# Patient Record
Sex: Female | Born: 1955 | Race: White | Hispanic: No | Marital: Single | Smoking: Never smoker
Health system: Southern US, Community
[De-identification: ages and names within clinical notes are randomized; demographics above are authoritative.]

## PROBLEM LIST (undated history)

## (undated) DIAGNOSIS — T4145XA Adverse effect of unspecified anesthetic, initial encounter: Secondary | ICD-10-CM

## (undated) DIAGNOSIS — Z9889 Other specified postprocedural states: Secondary | ICD-10-CM

## (undated) DIAGNOSIS — C763 Malignant neoplasm of pelvis: Secondary | ICD-10-CM

## (undated) DIAGNOSIS — E079 Disorder of thyroid, unspecified: Secondary | ICD-10-CM

## (undated) DIAGNOSIS — R112 Nausea with vomiting, unspecified: Secondary | ICD-10-CM

## (undated) DIAGNOSIS — Z8489 Family history of other specified conditions: Secondary | ICD-10-CM

## (undated) DIAGNOSIS — E039 Hypothyroidism, unspecified: Secondary | ICD-10-CM

## (undated) DIAGNOSIS — D649 Anemia, unspecified: Secondary | ICD-10-CM

## (undated) DIAGNOSIS — T8859XA Other complications of anesthesia, initial encounter: Secondary | ICD-10-CM

## (undated) DIAGNOSIS — Z801 Family history of malignant neoplasm of trachea, bronchus and lung: Secondary | ICD-10-CM

## (undated) DIAGNOSIS — K219 Gastro-esophageal reflux disease without esophagitis: Secondary | ICD-10-CM

## (undated) DIAGNOSIS — E785 Hyperlipidemia, unspecified: Secondary | ICD-10-CM

## (undated) DIAGNOSIS — Z9221 Personal history of antineoplastic chemotherapy: Secondary | ICD-10-CM

## (undated) DIAGNOSIS — C569 Malignant neoplasm of unspecified ovary: Secondary | ICD-10-CM

## (undated) DIAGNOSIS — E119 Type 2 diabetes mellitus without complications: Secondary | ICD-10-CM

## (undated) DIAGNOSIS — K5790 Diverticulosis of intestine, part unspecified, without perforation or abscess without bleeding: Secondary | ICD-10-CM

## (undated) DIAGNOSIS — T7840XA Allergy, unspecified, initial encounter: Secondary | ICD-10-CM

## (undated) HISTORY — DX: Hyperlipidemia, unspecified: E78.5

## (undated) HISTORY — DX: Family history of malignant neoplasm of trachea, bronchus and lung: Z80.1

## (undated) HISTORY — DX: Diverticulosis of intestine, part unspecified, without perforation or abscess without bleeding: K57.90

## (undated) HISTORY — PX: ABDOMINAL HYSTERECTOMY: SHX81

## (undated) HISTORY — DX: Disorder of thyroid, unspecified: E07.9

## (undated) HISTORY — DX: Allergy, unspecified, initial encounter: T78.40XA

## (undated) HISTORY — DX: Type 2 diabetes mellitus without complications: E11.9

## (undated) HISTORY — PX: BREAST CYST ASPIRATION: SHX578

## (undated) HISTORY — DX: Malignant neoplasm of pelvis: C76.3

---
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
## 2005-02-12 HISTORY — PX: BACK SURGERY: SHX140

## 2005-04-23 ENCOUNTER — Inpatient Hospital Stay (HOSPITAL_COMMUNITY): Admission: RE | Admit: 2005-04-23 | Discharge: 2005-04-28 | Payer: Self-pay | Admitting: Neurosurgery

## 2005-05-09 ENCOUNTER — Encounter: Admission: RE | Admit: 2005-05-09 | Discharge: 2005-05-09 | Payer: Self-pay | Admitting: Thoracic Surgery

## 2005-05-23 ENCOUNTER — Encounter: Admission: RE | Admit: 2005-05-23 | Discharge: 2005-05-23 | Payer: Self-pay | Admitting: Thoracic Surgery

## 2005-06-13 ENCOUNTER — Encounter: Admission: RE | Admit: 2005-06-13 | Discharge: 2005-06-13 | Payer: Self-pay | Admitting: Thoracic Surgery

## 2005-09-25 ENCOUNTER — Encounter: Admission: RE | Admit: 2005-09-25 | Discharge: 2005-09-25 | Payer: Self-pay | Admitting: Thoracic Surgery

## 2006-11-27 DIAGNOSIS — N809 Endometriosis, unspecified: Secondary | ICD-10-CM | POA: Insufficient documentation

## 2006-11-27 DIAGNOSIS — J309 Allergic rhinitis, unspecified: Secondary | ICD-10-CM | POA: Insufficient documentation

## 2006-11-27 DIAGNOSIS — E039 Hypothyroidism, unspecified: Secondary | ICD-10-CM | POA: Insufficient documentation

## 2007-01-13 ENCOUNTER — Ambulatory Visit: Payer: Self-pay | Admitting: Family Medicine

## 2007-01-29 ENCOUNTER — Ambulatory Visit: Payer: Self-pay | Admitting: Family Medicine

## 2007-02-09 DIAGNOSIS — E7800 Pure hypercholesterolemia, unspecified: Secondary | ICD-10-CM | POA: Insufficient documentation

## 2008-02-13 HISTORY — PX: CATARACT EXTRACTION: SUR2

## 2008-06-29 ENCOUNTER — Ambulatory Visit: Payer: Self-pay | Admitting: Family Medicine

## 2009-01-06 ENCOUNTER — Emergency Department: Payer: Self-pay | Admitting: Emergency Medicine

## 2009-07-06 ENCOUNTER — Ambulatory Visit: Payer: Self-pay | Admitting: Ophthalmology

## 2009-08-23 ENCOUNTER — Ambulatory Visit: Payer: Self-pay

## 2009-09-26 ENCOUNTER — Ambulatory Visit: Payer: Self-pay | Admitting: Gastroenterology

## 2009-09-26 LAB — HM COLONOSCOPY

## 2010-03-05 ENCOUNTER — Encounter: Payer: Self-pay | Admitting: Thoracic Surgery

## 2011-04-04 LAB — HM PAP SMEAR: HM PAP: NEGATIVE

## 2011-04-04 LAB — HM HEPATITIS C SCREENING LAB: HM Hepatitis Screen: NEGATIVE

## 2011-04-17 ENCOUNTER — Ambulatory Visit: Payer: Self-pay | Admitting: Family Medicine

## 2011-06-26 ENCOUNTER — Ambulatory Visit: Payer: Self-pay

## 2011-07-05 ENCOUNTER — Ambulatory Visit: Payer: Self-pay

## 2011-09-06 ENCOUNTER — Ambulatory Visit: Payer: Self-pay | Admitting: Family Medicine

## 2012-09-22 ENCOUNTER — Ambulatory Visit: Payer: Self-pay | Admitting: Family Medicine

## 2012-09-23 ENCOUNTER — Ambulatory Visit: Payer: Self-pay | Admitting: Family Medicine

## 2012-12-07 ENCOUNTER — Emergency Department: Payer: Self-pay | Admitting: Emergency Medicine

## 2013-03-25 LAB — LIPID PANEL
CHOLESTEROL: 160 mg/dL (ref 0–200)
HDL: 63 mg/dL (ref 35–70)
LDL Cholesterol: 74 mg/dL
Triglycerides: 115 mg/dL (ref 40–160)

## 2013-03-25 LAB — BASIC METABOLIC PANEL
BUN: 8 mg/dL (ref 4–21)
CREATININE: 0.7 mg/dL (ref <=1.1)
Glucose: 89 mg/dL
Potassium: 3.9 mmol/L (ref 3.4–5.3)
Sodium: 144 mmol/L (ref 137–147)

## 2013-03-25 LAB — CBC AND DIFFERENTIAL
HCT: 37 % (ref 36–46)
Hemoglobin: 12.3 g/dL (ref 12.0–16.0)
Platelets: 177 10*3/uL (ref 150–399)
WBC: 5.4 10^3/mL

## 2013-03-25 LAB — HEPATIC FUNCTION PANEL
ALT: 23 U/L (ref 7–35)
AST: 36 U/L — AB (ref 13–35)

## 2013-10-02 LAB — TSH: TSH: 1.13 u[IU]/mL (ref <=5.90)

## 2013-11-12 ENCOUNTER — Ambulatory Visit: Payer: Self-pay | Admitting: Family Medicine

## 2013-11-12 LAB — HM MAMMOGRAPHY

## 2014-06-06 NOTE — Op Note (Signed)
PATIENT NAME:  Meredith Jones, Meredith Jones MR#:  [id number] DATE OF BIRTH:  03-May-1955  DATE OF PROCEDURE:  07/05/2011  PREOPERATIVE DIAGNOSIS: Possible endometrial hyperplasia.   POSTOPERATIVE DIAGNOSIS: Postmenopausal.   PROCEDURE PERFORMED: Dilation and curettage.  SURGEON: Wonda Cheng. Laurey Morale, M.D.   OPERATIVE FINDINGS: Exceedingly tight vagina making visualization of the cervix almost impossible. Ultimately a combination of several of the speculums were broken down to allow visualization of the cervix which was grasped with a Jacob's tenaculum. The uterine cavity sounded to 4 cm. The cervix was dilated with some difficulty. Uterine curettage was performed with return of minimal tissue if any at all. The patient tolerated the procedure well and left the Operating Room in good condition. Sponge and needle counts were said to be correct at the end of the procedure.  ____________________________ Wonda Cheng. Laurey Morale, MD pjr:slb D: 07/05/2011 11:43:45 ET T: 07/05/2011 14:59:26 ET JOB#: [id number]  cc: Wonda Cheng. Laurey Morale, MD, <Dictator> Rosina Lowenstein MD ELECTRONICALLY SIGNED 07/06/2011 17:03

## 2014-06-09 DIAGNOSIS — N852 Hypertrophy of uterus: Secondary | ICD-10-CM | POA: Insufficient documentation

## 2014-06-09 DIAGNOSIS — N8500 Endometrial hyperplasia, unspecified: Secondary | ICD-10-CM | POA: Insufficient documentation

## 2014-06-09 DIAGNOSIS — Z6828 Body mass index (BMI) 28.0-28.9, adult: Secondary | ICD-10-CM | POA: Insufficient documentation

## 2014-06-09 DIAGNOSIS — N83209 Unspecified ovarian cyst, unspecified side: Secondary | ICD-10-CM | POA: Insufficient documentation

## 2014-06-13 HISTORY — PX: EYE SURGERY: SHX253

## 2014-07-27 ENCOUNTER — Encounter: Payer: Self-pay | Admitting: Family Medicine

## 2014-07-27 ENCOUNTER — Ambulatory Visit (INDEPENDENT_AMBULATORY_CARE_PROVIDER_SITE_OTHER): Payer: Commercial Managed Care - PPO | Admitting: Family Medicine

## 2014-07-27 VITALS — BP 110/60 | HR 76 | Temp 98.6°F | Resp 16 | Ht 64.0 in | Wt 150.0 lb

## 2014-07-27 DIAGNOSIS — E119 Type 2 diabetes mellitus without complications: Secondary | ICD-10-CM | POA: Diagnosis not present

## 2014-07-27 DIAGNOSIS — R81 Glycosuria: Secondary | ICD-10-CM | POA: Diagnosis not present

## 2014-07-27 DIAGNOSIS — Z0000 Encounter for general adult medical examination without abnormal findings: Secondary | ICD-10-CM | POA: Diagnosis not present

## 2014-07-27 DIAGNOSIS — Z1211 Encounter for screening for malignant neoplasm of colon: Secondary | ICD-10-CM | POA: Diagnosis not present

## 2014-07-27 DIAGNOSIS — E7800 Pure hypercholesterolemia, unspecified: Secondary | ICD-10-CM

## 2014-07-27 DIAGNOSIS — E039 Hypothyroidism, unspecified: Secondary | ICD-10-CM

## 2014-07-27 LAB — POCT URINALYSIS DIPSTICK
BILIRUBIN UA: NEGATIVE
Blood, UA: NEGATIVE
GLUCOSE UA: 2000
KETONES UA: NEGATIVE
LEUKOCYTES UA: NEGATIVE
Nitrite, UA: NEGATIVE
Protein, UA: NEGATIVE
SPEC GRAV UA: 1.01
Urobilinogen, UA: 0.2
pH, UA: 6

## 2014-07-27 LAB — IFOBT (OCCULT BLOOD): IFOBT: NEGATIVE

## 2014-07-27 LAB — GLUCOSE, POCT (MANUAL RESULT ENTRY)

## 2014-07-27 MED ORDER — GLIPIZIDE 5 MG PO TABS: 5.0000 mg | ORAL_TABLET | Freq: Two times a day (BID) | ORAL | Status: DC

## 2014-07-27 MED ORDER — METFORMIN HCL 500 MG PO TABS: 500.0000 mg | ORAL_TABLET | Freq: Two times a day (BID) | ORAL | Status: DC

## 2014-07-27 NOTE — Progress Notes (Signed)
Patient ID: Meredith Jones, female   DOB: 11-Jul-1955, 59 y.o.   MRN: [id number] [name], Female    DOB: 05-Apr-1955, 59 y.o.   MRN: [id number] Visit Date: 07/27/2014  Today's Provider: Margarita Rana, MD   Chief Complaint  Patient presents with  . Annual Exam  . Urinary Frequency   Subjective:    Annual physical exam Meredith Jones is a 59 y.o. female who presents today for health maintenance and complete physical. She feels fairly well. She reports exercising (stays very active with work and house chores). She reports she is sleeping well.  Last CPE: 10/02/13 04/04/11 Pap: neg HPV-neg 11/12/13 Mammogram: BI-RADS 1 04/04/11 EKG 01/16/07: TDAP    Patient also with urinary frequency, weight loss, dry mouth, worsening over past month. Does also have fatigue. Has strong family history of diabetes. Normal labs last year. No vision changes. No numbness or other systemic symptoms.    Review of Systems  Constitutional: Positive for fatigue.  HENT: Negative.   Eyes: Negative.   Respiratory: Negative.   Cardiovascular: Negative.   Gastrointestinal: Negative.   Endocrine: Negative.   Genitourinary: Positive for urgency, frequency and enuresis.  Musculoskeletal: Negative.   Allergic/Immunologic: Negative.   Neurological: Negative.   Hematological: Negative.   Psychiatric/Behavioral: Positive for decreased concentration.    Social History She  reports that she has never smoked. She has never used smokeless tobacco. She reports that she does not drink alcohol or use illicit drugs.  Patient Active Problem List   Diagnosis Date Noted  . Body mass index 28.0-28.9, adult 06/09/2014  . 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 Cyst of ovary 06/09/2014  . Bulky or enlarged uterus 06/09/2014  . Hypercholesteremia 02/09/2007  . Allergic rhinitis 11/27/2006  . Endometriosis 11/27/2006  . Adult hypothyroidism 11/27/2006    Past Surgical History  Procedure Laterality Date   . Cataract extraction Left 2010    Right Eye 2011  . Back surgery  2007    Lumbar Surgery  . Tonsillectomy  1963  . Eye surgery Bilateral 06/2014     tear duck catherization ( Dr. Vallarie Mare)    Family History Her family history includes Cancer in her father and paternal grandmother; Congestive Heart Failure in her mother; Diabetes in her mother; Hypothyroidism in her father; Stroke in her mother.    Previous Medications   ASPIRIN 81 MG TABLET    ASPIRIN, 81MG  (Oral Tablet Delayed Release)  1 a week for 0 days  Quantity: 0.00;  Refills: 0   Ordered :02-Oct-2010  Doy Hutching ;  Started 12-Mar-2007 Active   CALCIPOTRIENE (DOVONOX) 0.005 % OINTMENT    CALCIPOTRIENE, 0.005% (External Ointment) - Historical Medication  apply to skin daily (0.005 %) Active   CLOBETASOL OINTMENT (TEMOVATE) 0.05 %    CLOBETASOL PROPIONATE, 0.05% (External Ointment) - Historical Medication  apply to skin daily (0.05 %) Active   CYCLOSPORINE (RESTASIS) 0.05 % OPHTHALMIC EMULSION       FEXOFENADINE (ALLEGRA) 60 MG TABLET    Take by mouth.   LEVOTHYROXINE (SYNTHROID, LEVOTHROID) 112 MCG TABLET    Take by mouth.   LORATADINE (CLARITIN) 10 MG TABLET    ALAVERT, 10MG  (Oral Tablet)  1 po q d as needed for 0 days  Quantity: 1.00;  Refills: 0   Ordered :02-Oct-2010  Reginia Forts MD;  Buddy Duty [phone] Active Comments: Medication taken as needed.   MULTIPLE VITAMIN PO    MULTIVITAMINS (Oral Tablet)  1 Every Day for 0 days  Quantity:  0.00;  Refills: 0   Ordered :02-Oct-2010  Doy Hutching ;  Started 12-Mar-2007 Active   SIMVASTATIN (ZOCOR) 20 MG TABLET    Take by mouth.   TRIAMCINOLONE CREAM (KENALOG) 0.1 %    Apply 1 application topically 2 (two) times daily.    Patient Care Team: Margarita Rana, MD as PCP - General (Family Medicine)   Allergies  Allergen Reactions  . Clarithromycin Nausea Only  . Doxycycline     nausea, dizziness.  Marland Kitchen Penicillins Hives        Objective:   Vitals: BP 110/60 mmHg  Pulse  76  Temp(Src) 98.6 F (37 C) (Oral)  Resp 16  Ht 5\' 4"  (1.626 m)  Wt 150 lb (68.04 kg)  BMI 25.73 kg/m2   Physical Exam  Constitutional: She is oriented to person, place, and time. She appears well-developed and well-nourished.  HENT:  Head: Normocephalic and atraumatic.  Right Ear: Tympanic membrane, external ear and ear canal normal.  Left Ear: Tympanic membrane, external ear and ear canal normal.  Nose: Nose normal.  Mouth/Throat: Uvula is midline, oropharynx is clear and moist and mucous membranes are normal.  Eyes: Conjunctivae, EOM and lids are normal. Pupils are equal, round, and reactive to light.  Neck: Trachea normal and normal range of motion. Neck supple. Carotid bruit is not present. No thyroid mass and no thyromegaly present.  Cardiovascular: Normal rate, regular rhythm and normal heart sounds.   Pulmonary/Chest: Effort normal and breath sounds normal.  Abdominal: Soft. Normal appearance and bowel sounds are normal. There is no hepatosplenomegaly. There is no tenderness.  Genitourinary: No breast swelling, tenderness or discharge.  Musculoskeletal: Normal range of motion.  Lymphadenopathy:    She has no cervical adenopathy.    She has no axillary adenopathy.  Neurological: She is alert and oriented to person, place, and time. She has normal strength. No cranial nerve deficit.  Skin: Skin is warm, dry and intact.  Psychiatric: She has a normal mood and affect. Her speech is normal and behavior is normal. Judgment and thought content normal. Cognition and memory are normal.     Depression Screen PHQ 2/9 Scores 07/27/2014  PHQ - 2 Score 0      Assessment & Plan:      Routine Health Maintenance and Physical Exam  Exercise Activities and Dietary recommendations Goals    . Exercise 150 minutes per week (moderate activity)    . Peak Blood Glucose < 180       Immunization History  Administered Date(s) Administered  . Tdap 01/16/2007    Health Maintenance   Topic Date Due  . HEMOGLOBIN A1C  July [birth year], 1955  . PNEUMOCOCCAL POLYSACCHARIDE VACCINE (1) 03/01/1957  . FOOT EXAM  03/01/1965  . OPHTHALMOLOGY EXAM  03/01/1965  . URINE MICROALBUMIN  03/01/1965  . HIV Screening  03/01/1970  . PAP SMEAR  04/03/2014  . INFLUENZA VACCINE  09/13/2014  . MAMMOGRAM  11/13/2015  . TETANUS/TDAP  01/15/2017  . COLONOSCOPY  09/27/2019      Discussed health benefits of physical activity, and encouraged her to engage in regular exercise appropriate for her age and condition.    --------------------------------------------------------------------  - POCT urinalysis dipstick  2. Glucose found in urine on examination Blood sugar too high to read today.  See below.  - POCT glucose (manual entry)  3. Colon cancer screening No blood.  - IFOBT POC (occult bld, rslt in office)  4. Diabetes mellitus, new onset  Discussed with Dr Gabriel Carina. Will follow up  with her. Will check labs and give trial of oral agents as currently so stable, no ketones. Will start Metformin and Glipizide. Given monitor and taught how to use it today.   - Ambulatory referral to Endocrinology - Hemoglobin A1c  5. Hypothyroidism, unspecified hypothyroidism type Will check labs.  - TSH  6. Hypercholesteremia Will check labs.  - CBC with Differential/Platelet - Comprehensive metabolic panel - Lipid panel    Patient seen and examined by Dr. Jerrell Belfast, and note scribed by Philbert Riser. Dimas, CMA. I have reviewed the document for accuracy and completeness and I agree with above. Jerrell Belfast, MD    Margarita Rana, MD

## 2014-07-29 ENCOUNTER — Telehealth: Payer: Self-pay

## 2014-07-29 LAB — LIPID PANEL
CHOL/HDL RATIO: 2.5 ratio (ref 0.0–4.4)
CHOLESTEROL TOTAL: 150 mg/dL (ref 100–199)
HDL: 59 mg/dL (ref >39)
LDL Calculated: 68 mg/dL (ref 0–99)
Triglycerides: 117 mg/dL (ref 0–149)
VLDL Cholesterol Cal: 23 mg/dL (ref 5–40)

## 2014-07-29 LAB — CBC WITH DIFFERENTIAL/PLATELET
Basophils Absolute: 0.1 10*3/uL (ref 0.0–0.2)
Basos: 1 %
EOS (ABSOLUTE): 0.6 10*3/uL — ABNORMAL HIGH (ref 0.0–0.4)
Eos: 12 %
Hematocrit: 38.8 % (ref 34.0–46.6)
Hemoglobin: 13.4 g/dL (ref 11.1–15.9)
IMMATURE GRANULOCYTES: 0 %
Immature Grans (Abs): 0 10*3/uL (ref 0.0–0.1)
Lymphocytes Absolute: 1.6 10*3/uL (ref 0.7–3.1)
Lymphs: 31 %
MCH: 32.6 pg (ref 26.6–33.0)
MCHC: 34.5 g/dL (ref 31.5–35.7)
MCV: 94 fL (ref 79–97)
MONOS ABS: 0.4 10*3/uL (ref 0.1–0.9)
Monocytes: 9 %
NEUTROS PCT: 47 %
Neutrophils Absolute: 2.4 10*3/uL (ref 1.4–7.0)
PLATELETS: 217 10*3/uL (ref 150–379)
RBC: 4.11 x10E6/uL (ref 3.77–5.28)
RDW: 13.8 % (ref 12.3–15.4)
WBC: 5 10*3/uL (ref 3.4–10.8)

## 2014-07-29 LAB — COMPREHENSIVE METABOLIC PANEL
A/G RATIO: 1.6 (ref 1.1–2.5)
ALT: 27 IU/L (ref 0–32)
AST: 29 IU/L (ref 0–40)
Albumin: 4.1 g/dL (ref 3.5–5.5)
Alkaline Phosphatase: 76 IU/L (ref 39–117)
BUN/Creatinine Ratio: 11 (ref 9–23)
BUN: 8 mg/dL (ref 6–24)
Bilirubin Total: 0.5 mg/dL (ref 0.0–1.2)
CALCIUM: 9.8 mg/dL (ref 8.7–10.2)
CO2: 27 mmol/L (ref 18–29)
CREATININE: 0.75 mg/dL (ref 0.57–1.00)
Chloride: 94 mmol/L — ABNORMAL LOW (ref 97–108)
GFR calc Af Amer: 101 mL/min/{1.73_m2} (ref >59)
GFR calc non Af Amer: 88 mL/min/{1.73_m2} (ref >59)
GLUCOSE: 364 mg/dL — AB (ref 65–99)
Globulin, Total: 2.5 g/dL (ref 1.5–4.5)
POTASSIUM: 4.1 mmol/L (ref 3.5–5.2)
Sodium: 137 mmol/L (ref 134–144)
TOTAL PROTEIN: 6.6 g/dL (ref 6.0–8.5)

## 2014-07-29 LAB — HEMOGLOBIN A1C
Est. average glucose Bld gHb Est-mCnc: 324 mg/dL
Hgb A1c MFr Bld: 12.9 % — ABNORMAL HIGH (ref 4.8–5.6)

## 2014-07-29 LAB — TSH: TSH: 0.42 u[IU]/mL — ABNORMAL LOW (ref 0.450–4.500)

## 2014-07-29 NOTE — Telephone Encounter (Signed)
-----   Message from Margarita Rana, MD sent at 07/29/2014 11:22 AM EDT ----- Labs consistent with diabetes. Proceed with plan as discussed. Clarify when seeing endocrinology.  Thanks.

## 2014-07-29 NOTE — Telephone Encounter (Signed)
Pt advised as directed below.   Thanks,   -Laura  

## 2014-07-29 NOTE — Telephone Encounter (Signed)
LMTCB 07/29/2014  Thanks,   -Mickel Baas

## 2014-07-30 DIAGNOSIS — E1165 Type 2 diabetes mellitus with hyperglycemia: Secondary | ICD-10-CM | POA: Insufficient documentation

## 2014-07-30 DIAGNOSIS — IMO0002 Reserved for concepts with insufficient information to code with codable children: Secondary | ICD-10-CM | POA: Insufficient documentation

## 2014-08-10 ENCOUNTER — Encounter: Payer: Self-pay | Admitting: Dietician

## 2014-08-10 ENCOUNTER — Encounter: Payer: Commercial Managed Care - PPO | Attending: Family Medicine | Admitting: Dietician

## 2014-08-10 VITALS — BP 105/66 | Ht 63.5 in | Wt 143.1 lb

## 2014-08-10 DIAGNOSIS — E118 Type 2 diabetes mellitus with unspecified complications: Secondary | ICD-10-CM | POA: Diagnosis present

## 2014-08-10 DIAGNOSIS — E119 Type 2 diabetes mellitus without complications: Secondary | ICD-10-CM

## 2014-08-10 NOTE — Patient Instructions (Signed)
  Check blood sugars 2 x day before breakfast and 2 hrs after supper every day Continue walking  30 min daily   Avoid sugar sweetened drinks (soda, tea, coffee, sports drinks, juices) Eat 3 balanced meals/day and 2-3 snacks/day-be sure to eat a small bedtime snack  Space meals 4-5 hours apart Bring blood sugar records to the next appointment/class Get a Regulatory affairs officer fast acting glucose or candy at all times Return for appointment/classes on:  09-20-14

## 2014-08-10 NOTE — Progress Notes (Signed)
Diabetes Self-Management Education  Visit Type: First/Initial  Appt. Start Time: 1345 Appt. End Time: 7517  08/10/2014  Ms. Meredith Jones, identified by name and date of birth, is a 59 y.o. female with a diagnosis of Diabetes: Type 2.  Other people present during visit:      ASSESSMENT  Blood pressure 105/66, height 5' 3.5" (1.613 m), weight 143 lb 1.6 oz (64.91 kg). Body mass index is 24.95 kg/(m^2).  Pt reports recent FBG's 118-150's-had recent weak episode x1 which resolved after eating  Initial Visit Information:  Are you currently following a meal plan?:  (eating more vegetables and fruits, lean meats)   Are you taking your medications as prescribed?: Yes Are you checking your feet?: Yes How many days per week are you checking your feet?: 7 How often do you need to have someone help you when you read instructions, pamphlets, or other written materials from your doctor or pharmacy?: 1 - Never What is the last grade level you completed in school?: college graduate  Psychosocial:     Patient Belief/Attitude about Diabetes: Motivated to manage diabetes Self-care barriers: None Patient Concerns: Glycemic Control, Healthy Lifestyle Special Needs: None Preferred Learning Style: Auditory Learning Readiness: Ready  Complications:   How often do you check your blood sugar?: 1-2 times/day (checks FBG's daily) Fasting Blood glucose range (mg/dL): 130-179 (130's-150's recently) Have you had a dilated eye exam in the past 12 months?: Yes Have you had a dental exam in the past 12 months?: Yes  Diet Intake:  Breakfast:  (breakfast time varies 5-7a) Snack (morning):  (usually eats mid AM snack) Snack (afternoon):  (eats occasional afternoon snack) Snack (evening):  (does not eat a bedtime snack) Beverage(s):  (drinks occasional  fruit juice daily)  Exercise:  Exercise: ADL's, Light (walking / raking leaves) (alks, cleans, works 30-60 min daily)  Individualized Plan for  Diabetes Self-Management Training:   Learning Objective:  Patient will have a greater understanding of diabetes self-management.  Patient education plan per assessed needs and concerns is to attend individual sessions for     Education Topics Reviewed with Patient Today:  Definition of diabetes, type 1 and 2, and the diagnosis of diabetes Role of diet in the treatment of diabetes and the relationship between the three main macronutrients and blood glucose level, Food label reading, portion sizes and measuring food., Carbohydrate counting Role of exercise on diabetes management, blood pressure control and cardiac health. Reviewed patients medication for diabetes, action, purpose, timing of dose and side effects. Purpose and frequency of SMBG., Taught/evaluated SMBG meter., Taught/discussed recording of test results and interpretation of SMBG., Identified appropriate SMBG and/or A1C goals., Daily foot exams, Yearly dilated eye exam Taught treatment of hypoglycemia - the 15 rule. Relationship between chronic complications and blood glucose control, Dental care Role of stress on diabetes   Lifestyle issues that need to be addressed for better diabetes care  PATIENTS GOALS/Plan (Developed by the patient): Improve BG's  Lead healthier lifestyle Prevent complications           Patient Instructions   Check blood sugars 2 x day before breakfast and 2 hrs after supper every day Continue walking  30 min daily   Avoid sugar sweetened drinks (soda, tea, coffee, sports drinks, juices) Eat 3 balanced meals/day and 2-3 snacks/day-be sure to eat a small bedtime snack  Space meals 4-5 hours apart Bring blood sugar records to the next appointment/class Get a Regulatory affairs officer fast acting glucose or candy at all times  Return for appointment/classes on:  09-20-14   Expected Outcomes:  Demonstrated interest in learning. Expect positive outcomes  Education material provided: general meal  planning guidelines, low BG management  If problems or questions, patient to contact team via:  [phone]  Future DSME appointment:  09-20-14

## 2014-09-20 ENCOUNTER — Encounter: Payer: Commercial Managed Care - PPO | Attending: Family Medicine | Admitting: Dietician

## 2014-09-20 VITALS — Ht 63.5 in | Wt 137.7 lb

## 2014-09-20 DIAGNOSIS — E118 Type 2 diabetes mellitus with unspecified complications: Secondary | ICD-10-CM | POA: Insufficient documentation

## 2014-09-20 DIAGNOSIS — E119 Type 2 diabetes mellitus without complications: Secondary | ICD-10-CM

## 2014-09-20 NOTE — Addendum Note (Signed)
Addended by: Georgina Peer on: 09/20/2014 06:28 PM   Modules accepted: Medications

## 2014-09-20 NOTE — Progress Notes (Signed)

## 2014-09-27 ENCOUNTER — Encounter: Payer: Commercial Managed Care - PPO | Admitting: Dietician

## 2014-09-27 ENCOUNTER — Encounter: Payer: Self-pay | Admitting: Dietician

## 2014-09-27 VITALS — Wt 137.0 lb

## 2014-09-27 DIAGNOSIS — E119 Type 2 diabetes mellitus without complications: Secondary | ICD-10-CM

## 2014-09-27 DIAGNOSIS — E118 Type 2 diabetes mellitus with unspecified complications: Secondary | ICD-10-CM | POA: Diagnosis not present

## 2014-09-27 NOTE — Progress Notes (Signed)

## 2014-10-04 ENCOUNTER — Encounter: Payer: Commercial Managed Care - PPO | Admitting: Dietician

## 2014-10-04 VITALS — BP 94/62 | Ht 63.5 in | Wt 132.8 lb

## 2014-10-04 DIAGNOSIS — E118 Type 2 diabetes mellitus with unspecified complications: Secondary | ICD-10-CM | POA: Diagnosis not present

## 2014-10-04 DIAGNOSIS — E119 Type 2 diabetes mellitus without complications: Secondary | ICD-10-CM

## 2014-10-04 NOTE — Progress Notes (Signed)

## 2014-10-05 ENCOUNTER — Encounter: Payer: Self-pay | Admitting: Dietician

## 2014-11-24 ENCOUNTER — Other Ambulatory Visit: Payer: Self-pay | Admitting: Family Medicine

## 2014-11-24 DIAGNOSIS — E1165 Type 2 diabetes mellitus with hyperglycemia: Secondary | ICD-10-CM

## 2014-11-24 DIAGNOSIS — E039 Hypothyroidism, unspecified: Secondary | ICD-10-CM

## 2014-11-24 DIAGNOSIS — IMO0001 Reserved for inherently not codable concepts without codable children: Secondary | ICD-10-CM

## 2014-12-06 ENCOUNTER — Telehealth: Payer: Self-pay | Admitting: Family Medicine

## 2014-12-06 DIAGNOSIS — E039 Hypothyroidism, unspecified: Secondary | ICD-10-CM

## 2014-12-06 MED ORDER — LEVOTHYROXINE SODIUM 100 MCG PO TABS: 100.0000 ug | ORAL_TABLET | Freq: Every day | ORAL | Status: DC

## 2014-12-06 NOTE — Telephone Encounter (Signed)
Thyroid still overcorrected.Recommend decrease Levothyoxine to 100 and recheck labs in 6 weeks. Thanks.

## 2014-12-07 MED ORDER — LEVOTHYROXINE SODIUM 100 MCG PO TABS: 100.0000 ug | ORAL_TABLET | Freq: Every day | ORAL | Status: DC

## 2014-12-07 NOTE — Telephone Encounter (Signed)
Pt informed and voiced understanding of results.Med sent to pharmacy.

## 2014-12-07 NOTE — Telephone Encounter (Signed)
[id number] 

## 2014-12-07 NOTE — Telephone Encounter (Signed)
Left message to call back  

## 2014-12-10 ENCOUNTER — Other Ambulatory Visit: Payer: Self-pay | Admitting: Family Medicine

## 2014-12-10 DIAGNOSIS — E7800 Pure hypercholesterolemia, unspecified: Secondary | ICD-10-CM

## 2014-12-24 ENCOUNTER — Other Ambulatory Visit: Payer: Self-pay

## 2014-12-24 DIAGNOSIS — E1165 Type 2 diabetes mellitus with hyperglycemia: Principal | ICD-10-CM

## 2014-12-24 DIAGNOSIS — IMO0001 Reserved for inherently not codable concepts without codable children: Secondary | ICD-10-CM

## 2014-12-24 MED ORDER — GLUCOSE BLOOD VI STRP: ORAL_STRIP | Status: DC

## 2014-12-24 MED ORDER — GLIPIZIDE 5 MG PO TABS: ORAL_TABLET | ORAL | Status: DC

## 2015-02-03 ENCOUNTER — Encounter: Payer: Self-pay | Admitting: Family Medicine

## 2015-06-07 DIAGNOSIS — E119 Type 2 diabetes mellitus without complications: Secondary | ICD-10-CM | POA: Insufficient documentation

## 2015-06-10 ENCOUNTER — Other Ambulatory Visit: Payer: Self-pay | Admitting: Family Medicine

## 2015-06-10 DIAGNOSIS — E7800 Pure hypercholesterolemia, unspecified: Secondary | ICD-10-CM

## 2015-07-05 ENCOUNTER — Ambulatory Visit: Payer: Commercial Managed Care - PPO | Admitting: Family Medicine

## 2015-07-05 ENCOUNTER — Ambulatory Visit (INDEPENDENT_AMBULATORY_CARE_PROVIDER_SITE_OTHER): Payer: Commercial Managed Care - PPO | Admitting: Family Medicine

## 2015-07-05 ENCOUNTER — Encounter: Payer: Self-pay | Admitting: Family Medicine

## 2015-07-05 VITALS — BP 102/64 | HR 76 | Temp 98.6°F | Resp 16 | Ht 63.5 in | Wt 114.0 lb

## 2015-07-05 DIAGNOSIS — R634 Abnormal weight loss: Secondary | ICD-10-CM | POA: Diagnosis not present

## 2015-07-05 NOTE — Progress Notes (Signed)
Patient ID: Meredith Jones, female   DOB: Jul 03, 1955, 60 y.o.   MRN: [id number]         [name] Female    DOB: Feb 12, 1956   60 y.o.   MRN: [id number] Visit Date: 07/05/2015  Today's Provider: Margarita Rana, MD   Chief Complaint  Patient presents with  . Weight Loss   Subjective:    HPI   Pt comes in today complaining of unexplained weight loss.  Pt weighed 150lbs in 07/2014; today she weighs 114lb.  She reports feeling fine; and has a good appetite.  She is careful about what she eats secondary to being diagnosed with Diabetes in 07/2014, but not to explain the amount of weight she has lost.   Sugars are doing much better.  Has had a lot of stress recently. Taking care of sick family members.  ROS unremarkable as noted.        Allergies  Allergen Reactions  . Clarithromycin Nausea Only  . Doxycycline     nausea, dizziness.  Marland Kitchen Penicillins Hives   Previous Medications   ASPIRIN 81 MG TABLET    ASPIRIN, 81MG  (Oral Tablet Delayed Release)  1 a week for 0 days  Quantity: 0.00;  Refills: 0   Ordered :02-Oct-2010  Doy Hutching ;  Started 12-Mar-2007 Active   BISMUTH SUBSALICYLATE (PEPTO BISMOL) 262 MG CHEWABLE TABLET    Chew 262 mg by mouth as needed.   CALCIPOTRIENE (DOVONOX) 0.005 % OINTMENT    CALCIPOTRIENE, 0.005% (External Ointment) - Historical Medication  apply to skin daily (0.005 %) Active   CLOBETASOL OINTMENT (TEMOVATE) 0.05 %    CLOBETASOL PROPIONATE, 0.05% (External Ointment) - Historical Medication  apply to skin daily (0.05 %) Active   CYCLOSPORINE (RESTASIS) 0.05 % OPHTHALMIC EMULSION    Place 1 drop into both eyes 2 (two) times daily.    FEXOFENADINE (ALLEGRA) 60 MG TABLET    Take 60 mg by mouth daily. As needed for allergies   GLIPIZIDE (GLUCOTROL) 5 MG TABLET    TAKE ONE TABLET BEFORE MEALS TWICE A DAY   GLUCOSE BLOOD (ONE TOUCH ULTRA TEST) TEST STRIP    Use as instructed   LEVOTHYROXINE (SYNTHROID, LEVOTHROID) 100 MCG TABLET    Take 1 tablet  (100 mcg total) by mouth daily.   METFORMIN (GLUCOPHAGE) 500 MG TABLET    Take 1 tablet (500 mg total) by mouth 2 (two) times daily with a meal.   MULTIPLE VITAMIN PO    MULTIVITAMINS (Oral Tablet)  1 Every Day for 0 days  Quantity: 0.00;  Refills: 0   Ordered :02-Oct-2010  Doy Hutching ;  Started 12-Mar-2007 Active   NAPROXEN SODIUM (ANAPROX) 220 MG TABLET    Take 220 mg by mouth as needed. As needed   SIMVASTATIN (ZOCOR) 20 MG TABLET    TAKE 1 TABLET BY MOUTH EVERY NIGHT AT BEDTIME.   TRIAMCINOLONE CREAM (KENALOG) 0.1 %    Apply 1 application topically daily.     Review of Systems  Constitutional: Positive for unexpected weight change. Negative for fever, chills, diaphoresis, activity change, appetite change and fatigue.  Respiratory: Negative.   Cardiovascular: Negative.   Gastrointestinal: Negative.   Endocrine: Positive for cold intolerance. Negative for heat intolerance, polydipsia, polyphagia and polyuria.  Musculoskeletal: Negative.   Neurological: Negative for dizziness, weakness, light-headedness, numbness and headaches.    Social History  Substance Use Topics  . Smoking status: Never Smoker   . Smokeless tobacco: Never Used  . Alcohol  Use: No   Objective:   BP 102/64 mmHg  Pulse 76  Temp(Src) 98.6 F (37 C) (Oral)  Resp 16  Ht 5' 3.5" (1.613 m)  Wt 114 lb (51.71 kg)  BMI 19.87 kg/m2  Physical Exam  Constitutional: She is oriented to person, place, and time. She appears well-developed and well-nourished.  HENT:  Head: Normocephalic and atraumatic.  Eyes: No scleral icterus.  Neck: Normal range of motion. Neck supple.  Cardiovascular: Normal rate, regular rhythm and normal heart sounds.   Pulmonary/Chest: Effort normal and breath sounds normal.  Abdominal: Soft. Bowel sounds are normal. She exhibits no distension and no mass.  Musculoskeletal: Normal range of motion.  Lymphadenopathy:    She has no cervical adenopathy.  Neurological: She is alert and  oriented to person, place, and time.  Skin: Skin is warm and dry.  Psychiatric: She has a normal mood and affect. Her behavior is normal. Judgment and thought content normal.      Assessment & Plan:     1. Abnormal weight loss New problem. Very concerning.  Unclear etiology.  TSH normal at endocrinology.  Will check labs and obtain CT Scan of chest, abdomen, and pelvis.   Further plan pending these results.   - CBC with Differential/Platelet - Comprehensive metabolic panel - CT Chest W Contrast - CT Abdomen Pelvis W Contrast  Patient was seen and examined by Jerrell Belfast, MD, and note scribed by Ashley Royalty, CMA.   I have reviewed the document for accuracy and completeness and I agree with above. - Jerrell Belfast, MD    Margarita Rana, MD  [facility]

## 2015-07-06 ENCOUNTER — Telehealth: Payer: Self-pay

## 2015-07-06 ENCOUNTER — Other Ambulatory Visit: Payer: Self-pay | Admitting: Family Medicine

## 2015-07-06 DIAGNOSIS — E119 Type 2 diabetes mellitus without complications: Secondary | ICD-10-CM

## 2015-07-06 LAB — COMPREHENSIVE METABOLIC PANEL
ALBUMIN: 3.9 g/dL (ref 3.6–4.8)
ALT: 25 IU/L (ref 0–32)
AST: 26 IU/L (ref 0–40)
Albumin/Globulin Ratio: 1.7 (ref 1.2–2.2)
Alkaline Phosphatase: 48 IU/L (ref 39–117)
BUN / CREAT RATIO: 21 (ref 12–28)
BUN: 12 mg/dL (ref 8–27)
Bilirubin Total: 0.2 mg/dL (ref 0.0–1.2)
CO2: 28 mmol/L (ref 18–29)
CREATININE: 0.56 mg/dL — AB (ref 0.57–1.00)
Calcium: 9.9 mg/dL (ref 8.7–10.3)
Chloride: 95 mmol/L — ABNORMAL LOW (ref 96–106)
GFR, EST AFRICAN AMERICAN: 117 mL/min/{1.73_m2} (ref >59)
GFR, EST NON AFRICAN AMERICAN: 102 mL/min/{1.73_m2} (ref >59)
GLUCOSE: 91 mg/dL (ref 65–99)
Globulin, Total: 2.3 g/dL (ref 1.5–4.5)
Potassium: 4.3 mmol/L (ref 3.5–5.2)
Sodium: 137 mmol/L (ref 134–144)
TOTAL PROTEIN: 6.2 g/dL (ref 6.0–8.5)

## 2015-07-06 LAB — CBC WITH DIFFERENTIAL/PLATELET
BASOS ABS: 0.1 10*3/uL (ref 0.0–0.2)
Basos: 2 %
EOS (ABSOLUTE): 0.5 10*3/uL — ABNORMAL HIGH (ref 0.0–0.4)
EOS: 9 %
HEMOGLOBIN: 12 g/dL (ref 11.1–15.9)
Hematocrit: 34.7 % (ref 34.0–46.6)
IMMATURE GRANS (ABS): 0 10*3/uL (ref 0.0–0.1)
Immature Granulocytes: 0 %
LYMPHS: 31 %
Lymphocytes Absolute: 1.9 10*3/uL (ref 0.7–3.1)
MCH: 33.6 pg — AB (ref 26.6–33.0)
MCHC: 34.6 g/dL (ref 31.5–35.7)
MCV: 97 fL (ref 79–97)
MONOCYTES: 9 %
Monocytes Absolute: 0.5 10*3/uL (ref 0.1–0.9)
NEUTROS ABS: 3 10*3/uL (ref 1.4–7.0)
Neutrophils: 49 %
Platelets: 239 10*3/uL (ref 150–379)
RBC: 3.57 x10E6/uL — AB (ref 3.77–5.28)
RDW: 13.3 % (ref 12.3–15.4)
WBC: 6.1 10*3/uL (ref 3.4–10.8)

## 2015-07-06 NOTE — Telephone Encounter (Signed)
Left message to call back  

## 2015-07-06 NOTE — Telephone Encounter (Signed)
DM being F/B Dr. Gabriel Carina, endocrinology. Ok to reorder per verbal okay from Dr. Venia Minks. Renaldo Fiddler, CMA

## 2015-07-06 NOTE — Telephone Encounter (Signed)
-----   Message from Margarita Rana, MD sent at 07/06/2015  6:33 AM EDT ----- Labs stable.  Proceed with CT scan. Thanks.

## 2015-07-06 NOTE — Telephone Encounter (Signed)
Pt advised as directed below.   Thanks,   -Scottie Metayer  

## 2015-07-12 ENCOUNTER — Ambulatory Visit
Admission: RE | Admit: 2015-07-12 | Discharge: 2015-07-12 | Disposition: A | Discharge status: Home / Self Care | Payer: Commercial Managed Care - PPO | Source: Ambulatory Visit | Attending: Family Medicine | Admitting: Family Medicine

## 2015-07-12 DIAGNOSIS — R634 Abnormal weight loss: Secondary | ICD-10-CM | POA: Insufficient documentation

## 2015-07-12 DIAGNOSIS — R195 Other fecal abnormalities: Secondary | ICD-10-CM | POA: Insufficient documentation

## 2015-07-12 DIAGNOSIS — R935 Abnormal findings on diagnostic imaging of other abdominal regions, including retroperitoneum: Secondary | ICD-10-CM | POA: Diagnosis not present

## 2015-07-12 MED ORDER — IOPAMIDOL (ISOVUE-300) INJECTION 61%
85.0000 mL | Freq: Once | INTRAVENOUS | Status: AC | PRN
  Administered 2015-07-12: 85 mL via INTRAVENOUS

## 2015-07-13 ENCOUNTER — Telehealth: Payer: Self-pay

## 2015-07-13 DIAGNOSIS — R935 Abnormal findings on diagnostic imaging of other abdominal regions, including retroperitoneum: Secondary | ICD-10-CM

## 2015-07-13 NOTE — Telephone Encounter (Signed)
-----   Message from Margarita Rana, MD sent at 07/13/2015 11:01 AM EDT ----- Correction. Unusual thickening. Please see below. Thanks.

## 2015-07-13 NOTE — Telephone Encounter (Signed)
LMTCB 07/13/2015  Thanks,   -Mickel Baas

## 2015-07-13 NOTE — Telephone Encounter (Signed)
Pt is returning call.  OI:[id number]

## 2015-07-14 NOTE — Telephone Encounter (Signed)
Pt advised; referral in EPIC.  Thanks,   -Mickel Baas

## 2015-07-15 ENCOUNTER — Telehealth: Payer: Self-pay

## 2015-07-15 ENCOUNTER — Other Ambulatory Visit: Payer: Self-pay

## 2015-07-15 MED ORDER — NA SULFATE-K SULFATE-MG SULF 17.5-3.13-1.6 GM/177ML PO SOLN: 1.0000 | ORAL | Status: DC

## 2015-07-15 NOTE — Telephone Encounter (Signed)
Pt was currently scheduled for a colonoscopy on 08/11/15. Due a work issue with being able to prep prior to colonoscopy, pt has requested to reschedule this for July 21st. Paperwork has been updated with new date and mailed to pt. Advised to contact me with any questions once she receives this in the mail.

## 2015-07-15 NOTE — Telephone Encounter (Signed)
Patient called stating that she was seen by Dr. Dimas Millin (PCP) on 07/05/2015 due to abnormal weight loss. She was then scheduled to have a CT Scan. Findings recommended for her to be seen by a Gastroenterologist. I then spoke to Ginger and she recommended for her to schedule her a colonoscopy on 07/22/2015. However, patient stated that she would not be able to have it done on 07/22/2015. She requested to be done on 08/11/2015 and Ginger stated that she had an opening. Therefore, her colonoscopy will be done on 08/11/2015 at [facility]. I told patient that I would send her the information for her colonoscopy and prescription. I also told her that after she had her colonoscopy, her appointment to see Dr. Allen Norris will be on 08/18/2015. Patient agreed with the plan.

## 2015-08-05 ENCOUNTER — Other Ambulatory Visit: Payer: Self-pay | Admitting: Family Medicine

## 2015-08-05 DIAGNOSIS — E039 Hypothyroidism, unspecified: Secondary | ICD-10-CM

## 2015-08-18 ENCOUNTER — Ambulatory Visit: Payer: Self-pay | Admitting: Gastroenterology

## 2015-09-01 ENCOUNTER — Encounter: Payer: Self-pay | Admitting: *Deleted

## 2015-09-01 ENCOUNTER — Other Ambulatory Visit: Payer: Self-pay

## 2015-09-02 ENCOUNTER — Ambulatory Visit
Admission: RE | Admit: 2015-09-02 | Discharge: 2015-09-02 | Disposition: A | Discharge status: Home / Self Care | Payer: Commercial Managed Care - PPO | Source: Ambulatory Visit | Attending: Gastroenterology | Admitting: Gastroenterology

## 2015-09-02 ENCOUNTER — Encounter
Admission: RE | Disposition: A | Discharge status: Home / Self Care | Payer: Self-pay | Source: Ambulatory Visit | Attending: Gastroenterology

## 2015-09-02 ENCOUNTER — Ambulatory Visit: Payer: Commercial Managed Care - PPO | Admitting: Anesthesiology

## 2015-09-02 DIAGNOSIS — Z880 Allergy status to penicillin: Secondary | ICD-10-CM | POA: Insufficient documentation

## 2015-09-02 DIAGNOSIS — D124 Benign neoplasm of descending colon: Secondary | ICD-10-CM | POA: Insufficient documentation

## 2015-09-02 DIAGNOSIS — E785 Hyperlipidemia, unspecified: Secondary | ICD-10-CM | POA: Diagnosis not present

## 2015-09-02 DIAGNOSIS — E039 Hypothyroidism, unspecified: Secondary | ICD-10-CM | POA: Diagnosis not present

## 2015-09-02 DIAGNOSIS — Z79899 Other long term (current) drug therapy: Secondary | ICD-10-CM | POA: Diagnosis not present

## 2015-09-02 DIAGNOSIS — E119 Type 2 diabetes mellitus without complications: Secondary | ICD-10-CM | POA: Diagnosis not present

## 2015-09-02 DIAGNOSIS — K640 First degree hemorrhoids: Secondary | ICD-10-CM | POA: Diagnosis not present

## 2015-09-02 DIAGNOSIS — Z7984 Long term (current) use of oral hypoglycemic drugs: Secondary | ICD-10-CM | POA: Diagnosis not present

## 2015-09-02 DIAGNOSIS — Z7982 Long term (current) use of aspirin: Secondary | ICD-10-CM | POA: Insufficient documentation

## 2015-09-02 DIAGNOSIS — R634 Abnormal weight loss: Secondary | ICD-10-CM | POA: Insufficient documentation

## 2015-09-02 DIAGNOSIS — K5730 Diverticulosis of large intestine without perforation or abscess without bleeding: Secondary | ICD-10-CM | POA: Insufficient documentation

## 2015-09-02 HISTORY — DX: Hypothyroidism, unspecified: E03.9

## 2015-09-02 HISTORY — PX: POLYPECTOMY: SHX5525

## 2015-09-02 HISTORY — PX: COLONOSCOPY WITH PROPOFOL: SHX5780

## 2015-09-02 LAB — GLUCOSE, CAPILLARY
Glucose-Capillary: 101 mg/dL — ABNORMAL HIGH (ref 65–99)
Glucose-Capillary: 100 mg/dL — ABNORMAL HIGH (ref 65–99)

## 2015-09-02 SURGERY — COLONOSCOPY WITH PROPOFOL
Anesthesia: Monitor Anesthesia Care | Wound class: Contaminated

## 2015-09-02 MED ORDER — LIDOCAINE HCL (CARDIAC) 20 MG/ML IV SOLN
INTRAVENOUS | Status: DC | PRN
  Administered 2015-09-02: 50 mg via INTRAVENOUS

## 2015-09-02 MED ORDER — PROPOFOL 10 MG/ML IV BOLUS
INTRAVENOUS | Status: DC | PRN
  Administered 2015-09-02 (×4): 20 mg via INTRAVENOUS
  Administered 2015-09-02: 10 mg via INTRAVENOUS
  Administered 2015-09-02: 70 mg via INTRAVENOUS
  Administered 2015-09-02: 20 mg via INTRAVENOUS
  Administered 2015-09-02 (×2): 10 mg via INTRAVENOUS
  Administered 2015-09-02: 20 mg via INTRAVENOUS

## 2015-09-02 MED ORDER — ONDANSETRON HCL 4 MG/2ML IJ SOLN: 4.0000 mg | Freq: Once | INTRAMUSCULAR | Status: DC | PRN

## 2015-09-02 MED ORDER — ACETAMINOPHEN 160 MG/5ML PO SOLN: 325.0000 mg | ORAL | Status: DC | PRN

## 2015-09-02 MED ORDER — LACTATED RINGERS IV SOLN
INTRAVENOUS | Status: DC | PRN
  Administered 2015-09-02: 08:00:00 via INTRAVENOUS

## 2015-09-02 MED ORDER — ACETAMINOPHEN 325 MG PO TABS: 325.0000 mg | ORAL_TABLET | ORAL | Status: DC | PRN

## 2015-09-02 MED ORDER — LACTATED RINGERS IV SOLN: INTRAVENOUS | Status: DC

## 2015-09-02 MED ORDER — SIMETHICONE 40 MG/0.6ML PO SUSP
ORAL | Status: DC | PRN
  Administered 2015-09-02: 08:00:00

## 2015-09-02 SURGICAL SUPPLY — 23 items
CANISTER SUCT 1200ML W/VALVE (MISCELLANEOUS) ×4 IMPLANT
CLIP HMST 235XBRD CATH ROT (MISCELLANEOUS) IMPLANT
CLIP RESOLUTION 360 11X235 (MISCELLANEOUS)
FCP ESCP3.2XJMB 240X2.8X (MISCELLANEOUS)
FORCEPS BIOP RAD 4 LRG CAP 4 (CUTTING FORCEPS) ×4 IMPLANT
FORCEPS BIOP RJ4 240 W/NDL (MISCELLANEOUS)
FORCEPS ESCP3.2XJMB 240X2.8X (MISCELLANEOUS) IMPLANT
GOWN CVR UNV OPN BCK APRN NK (MISCELLANEOUS) ×4 IMPLANT
GOWN ISOL THUMB LOOP REG UNIV (MISCELLANEOUS) ×4
INJECTOR VARIJECT VIN23 (MISCELLANEOUS) IMPLANT
KIT DEFENDO VALVE AND CONN (KITS) IMPLANT
KIT ENDO PROCEDURE OLY (KITS) ×4 IMPLANT
MARKER SPOT ENDO TATTOO 5ML (MISCELLANEOUS) IMPLANT
PAD GROUND ADULT SPLIT (MISCELLANEOUS) IMPLANT
PROBE APC STR FIRE (PROBE) IMPLANT
RETRIEVER NET ROTH 2.5X230 LF (MISCELLANEOUS) ×4 IMPLANT
SNARE SHORT THROW 13M SML OVAL (MISCELLANEOUS) IMPLANT
SNARE SHORT THROW 30M LRG OVAL (MISCELLANEOUS) IMPLANT
SNARE SNG USE RND 15MM (INSTRUMENTS) IMPLANT
SPOT EX ENDOSCOPIC TATTOO (MISCELLANEOUS)
TRAP ETRAP POLY (MISCELLANEOUS) IMPLANT
VARIJECT INJECTOR VIN23 (MISCELLANEOUS)
WATER STERILE IRR 250ML POUR (IV SOLUTION) ×4 IMPLANT

## 2015-09-02 NOTE — Op Note (Signed)
[facility]astroenterology Patient Name: Meredith Jones Procedure Date: 09/02/2015 7:21 AM MRN: [id number] Account #: [id number] Date of Birth: 1955/07/18 Admit Type: Outpatient Age: 60 Room: [facility] OR ROOM 01 Gender: Female Note Status: Finalized Procedure:            Colonoscopy Indications:          Weight loss Providers:            [name] MD, MD Referring MD:         Jerrell Belfast, MD (Referring MD) Medicines:            Propofol per Anesthesia Complications:        No immediate complications. Procedure:            Pre-Anesthesia Assessment:                       - Prior to the procedure, a History and Physical was                        performed, and patient medications and allergies were                        reviewed. The patient's tolerance of previous                        anesthesia was also reviewed. The risks and benefits of                        the procedure and the sedation options and risks were                        discussed with the patient. All questions were                        answered, and informed consent was obtained. Prior                        Anticoagulants: The patient has taken no previous                        anticoagulant or antiplatelet agents. ASA Grade                        Assessment: II - A patient with mild systemic disease.                        After reviewing the risks and benefits, the patient was                        deemed in satisfactory condition to undergo the                        procedure.                       After obtaining informed consent, the colonoscope was                        passed under direct vision. Throughout the procedure,  the patient's blood pressure, pulse, and oxygen                        saturations were monitored continuously. The Olympus                        CF-HQ190L Colonoscope (S#. [id number]) was introduced                        through the  anus and advanced to the the terminal                        ileum. The colonoscopy was performed without                        difficulty. The patient tolerated the procedure well.                        The quality of the bowel preparation was excellent. Findings:      The perianal and digital rectal examinations were normal.      A 3 mm polyp was found in the descending colon. The polyp was sessile.       The polyp was removed with a cold biopsy forceps. Resection and       retrieval were complete.      Multiple small-mouthed diverticula were found in the entire colon.      The terminal ileum appeared normal.      Non-bleeding internal hemorrhoids were found during retroflexion. The       hemorrhoids were Grade I (internal hemorrhoids that do not prolapse). Impression:           - One 3 mm polyp in the descending colon, removed with                        a cold biopsy forceps. Resected and retrieved.                       - Diverticulosis in the entire examined colon.                       - The examined portion of the ileum was normal.                       - Non-bleeding internal hemorrhoids. Recommendation:       - Await pathology results.                       - Repeat colonoscopy in 5 years if polyp adenoma and 10                        years if hyperplastic Procedure Code(s):    --- Professional ---                       [phone], Colonoscopy, flexible; with biopsy, single or                        multiple Diagnosis Code(s):    --- Professional ---                       R63.4,  Abnormal weight loss                       D12.4, Benign neoplasm of descending colon CPT copyright 2016 American Medical Association. All rights reserved. The codes documented in this report are preliminary and upon coder review may  be revised to meet current compliance requirements. [name] MD, MD 09/02/2015 8:24:37 AM This report has been signed electronically. Number of Addenda: 0 Note  Initiated On: 09/02/2015 7:21 AM Scope Withdrawal Time: 0 hours 12 minutes 36 seconds  Total Procedure Duration: 0 hours 20 minutes 37 seconds       [facility]

## 2015-09-02 NOTE — Anesthesia Preprocedure Evaluation (Signed)
Anesthesia Evaluation  Patient identified by MRN, date of birth, ID band Patient awake    Reviewed: Allergy & Precautions, NPO status , Patient's Chart, lab work & pertinent test results  Airway Mallampati: I  TM Distance: >3 FB Neck ROM: Full    Dental no notable dental hx.    Pulmonary neg pulmonary ROS,    Pulmonary exam normal        Cardiovascular Normal cardiovascular exam     Neuro/Psych negative neurological ROS  negative psych ROS   GI/Hepatic negative GI ROS, Neg liver ROS,   Endo/Other  diabetes, Type 2, Oral Hypoglycemic AgentsHypothyroidism   Renal/GU      Musculoskeletal negative musculoskeletal ROS (+)   Abdominal   Peds  Hematology negative hematology ROS (+)   Anesthesia Other Findings   Reproductive/Obstetrics negative OB ROS                             Anesthesia Physical Anesthesia Plan  ASA: II  Anesthesia Plan: MAC   Post-op Pain Management:    Induction: Intravenous  Airway Management Planned:   Additional Equipment:   Intra-op Plan:   Post-operative Plan:   Informed Consent: I have reviewed the patients History and Physical, chart, labs and discussed the procedure including the risks, benefits and alternatives for the proposed anesthesia with the patient or authorized representative who has indicated his/her understanding and acceptance.     Plan Discussed with: CRNA  Anesthesia Plan Comments:         Anesthesia Quick Evaluation

## 2015-09-02 NOTE — Anesthesia Postprocedure Evaluation (Signed)
Anesthesia Post Note  [name]  Procedure(s) Performed: Procedure(s) (LRB): COLONOSCOPY WITH PROPOFOL (N/A) POLYPECTOMY  Patient location during evaluation: PACU Anesthesia Type: MAC Level of consciousness: awake and alert and oriented Pain management: pain level controlled Vital Signs Assessment: post-procedure vital signs reviewed and stable Respiratory status: spontaneous breathing and nonlabored ventilation Cardiovascular status: stable Postop Assessment: no signs of nausea or vomiting and adequate PO intake Anesthetic complications: no    Estill Batten

## 2015-09-02 NOTE — Anesthesia Procedure Notes (Signed)
Procedure Name: MAC Performed by: Allannah Kempen Pre-anesthesia Checklist: Patient identified, Emergency Drugs available, Suction available, Timeout performed and Patient being monitored Patient Re-evaluated:Patient Re-evaluated prior to inductionOxygen Delivery Method: Nasal cannula Placement Confirmation: positive ETCO2       

## 2015-09-02 NOTE — Transfer of Care (Signed)
Immediate Anesthesia Transfer of Care Note  [name]  Procedure(s) Performed: Procedure(s) with comments: COLONOSCOPY WITH PROPOFOL (N/A) - Diabetic - oral meds POLYPECTOMY  Patient Location: PACU  Anesthesia Type: MAC  Level of Consciousness: awake, alert  and patient cooperative  Airway and Oxygen Therapy: Patient Spontanous Breathing and Patient connected to supplemental oxygen  Post-op Assessment: Post-op Vital signs reviewed, Patient's Cardiovascular Status Stable, Respiratory Function Stable, Patent Airway and No signs of Nausea or vomiting  Post-op Vital Signs: Reviewed and stable  Complications: No apparent anesthesia complications

## 2015-09-02 NOTE — H&P (Signed)
[name], MD [address] [place], [place] [address] Phone: [phone] Fax : [phone]  Primary Care Physician:  No PCP Per Patient Primary Gastroenterologist:  Dr. Allen Norris  Pre-Procedure History & Physical: HPI:  Meredith Jones is a 60 y.o. female is here for an colonoscopy.   Past Medical History  Diagnosis Date  . Diabetes mellitus without complication ([place])   . Thyroid disease   . Hyperlipidemia   . Diverticulosis   . Allergy   . Hypothyroidism     Past Surgical History  Procedure Laterality Date  . Cataract extraction Left 2010    Right Eye 2011  . Back surgery  2007    Lumbar Surgery  . Tonsillectomy  1963  . Eye surgery Bilateral 06/2014     tear duck catherization ( Dr. Vallarie Mare)    Prior to Admission medications   Medication Sig Start Date End Date Taking? Authorizing Provider  aspirin 81 MG tablet ASPIRIN, '81MG'$  (Oral Tablet Delayed Release)  1 a week for 0 days  Quantity: 0.00;  Refills: 0   Ordered :02-Oct-2010  Doy Hutching ;  Started 12-Mar-2007 Active 03/12/07  Yes Historical Provider, MD  bismuth subsalicylate (PEPTO BISMOL) 262 MG chewable tablet Chew 262 mg by mouth as needed.   Yes Historical Provider, MD  calcipotriene (DOVONOX) 0.005 % ointment CALCIPOTRIENE, 0.005% (External Ointment) - Historical Medication  apply to skin daily (0.005 %) Active   Yes Historical Provider, MD  clobetasol ointment (TEMOVATE) 0.05 % CLOBETASOL PROPIONATE, 0.05% (External Ointment) - Historical Medication  apply to skin daily (0.05 %) Active   Yes Historical Provider, MD  cycloSPORINE (RESTASIS) 0.05 % ophthalmic emulsion Place 1 drop into both eyes 2 (two) times daily.    Yes Historical Provider, MD  glipiZIDE (GLUCOTROL) 5 MG tablet TAKE ONE TABLET BEFORE MEALS TWICE A DAY 07/06/15  Yes Margarita Rana, MD  metFORMIN (GLUCOPHAGE) 500 MG tablet Take 1 tablet (500 mg total) by mouth 2 (two) times daily with a meal. Patient taking differently: Take 500  mg by mouth daily with breakfast. And two in the evenings. 07/27/14  Yes Margarita Rana, MD  MULTIPLE VITAMIN PO MULTIVITAMINS (Oral Tablet)  1 Every Day for 0 days  Quantity: 0.00;  Refills: 0   Ordered :02-Oct-2010  Doy Hutching ;  Started 12-Mar-2007 Active 03/12/07  Yes Historical Provider, MD  Polyvinyl Alcohol-Povidone (REFRESH OP) Apply to eye daily.   Yes Historical Provider, MD  simvastatin (ZOCOR) 20 MG tablet TAKE 1 TABLET BY MOUTH EVERY NIGHT AT BEDTIME. 06/10/15  Yes Margarita Rana, MD  SYNTHROID 100 MCG tablet TAKE 1 TABLET BY MOUTH EVERY DAY. 08/05/15  Yes Margarita Rana, MD  triamcinolone cream (KENALOG) 0.1 % Apply 1 application topically daily.    Yes Kennieth Francois, MD  fexofenadine (ALLEGRA) 60 MG tablet Take 60 mg by mouth daily. Reported on 09/01/2015    Historical Provider, MD  glucose blood (ONE TOUCH ULTRA TEST) test strip Use as instructed 12/24/14   Margarita Rana, MD  Na Sulfate-K Sulfate-Mg Sulf (SUPREP BOWEL PREP KIT) 17.5-3.13-1.6 GM/180ML SOLN Take 1 kit by mouth as directed. Take 1 bottle at 5:00 PM the day before procedure. Then take 1 bottle 4 hours before your procedure. 07/15/15   [name], MD  naproxen sodium (ANAPROX) 220 MG tablet Take 220 mg by mouth as needed. Reported on 09/01/2015    Historical Provider, MD    Allergies as of 09/01/2015 - Review Complete 09/01/2015  Allergen Reaction Noted  .  Clarithromycin Nausea Only 06/09/2014  . Doxycycline  06/09/2014  . Penicillins Hives 06/09/2014    Family History  Problem Relation Age of Onset  . Congestive Heart Failure Mother   . Diabetes Mother   . Stroke Mother   . Hypothyroidism Father   . Cancer Father     lung cancer  . Cancer Paternal Grandmother     stomach    Social History   Social History  . Marital Status: Single    Spouse Name: N/A  . Number of Children: N/A  . Years of Education: N/A   Occupational History  . Not on file.   Social History Main Topics  . Smoking status: Never  Smoker   . Smokeless tobacco: Never Used  . Alcohol Use: No  . Drug Use: No  . Sexual Activity: Not on file   Other Topics Concern  . Not on file   Social History Narrative    Review of Systems: See HPI, otherwise negative ROS  Physical Exam: BP 103/68 mmHg  Pulse 59  Temp(Src) 97.8 F (36.6 C) (Temporal)  Ht 5' 3.5" (1.613 m)  Wt 111 lb (50.349 kg)  BMI 19.35 kg/m2  SpO2 100% General:   Alert,  pleasant and cooperative in NAD Head:  Normocephalic and atraumatic. Neck:  Supple; no masses or thyromegaly. Lungs:  Clear throughout to auscultation.    Heart:  Regular rate and rhythm. Abdomen:  Soft, nontender and nondistended. Normal bowel sounds, without guarding, and without rebound.   Neurologic:  Alert and  oriented x4;  grossly normal neurologically.  Impression/Plan: TAURI ETHINGTON is here for an colonoscopy to be performed for weight loss  Risks, benefits, limitations, and alternatives regarding  colonoscopy have been reviewed with the patient.  Questions have been answered.  All parties agreeable.   [name], MD  09/02/2015, 7:49 AM

## 2015-09-05 ENCOUNTER — Ambulatory Visit: Payer: Self-pay | Admitting: Gastroenterology

## 2015-09-05 ENCOUNTER — Encounter: Payer: Self-pay | Admitting: Gastroenterology

## 2015-09-06 ENCOUNTER — Encounter: Payer: Self-pay | Admitting: Gastroenterology

## 2015-09-07 ENCOUNTER — Encounter: Payer: Self-pay | Admitting: Gastroenterology

## 2015-09-08 ENCOUNTER — Ambulatory Visit (INDEPENDENT_AMBULATORY_CARE_PROVIDER_SITE_OTHER): Payer: Commercial Managed Care - PPO | Admitting: Gastroenterology

## 2015-09-08 ENCOUNTER — Encounter: Payer: Self-pay | Admitting: Gastroenterology

## 2015-09-08 VITALS — BP 100/62 | HR 68 | Temp 99.0°F | Ht 63.5 in | Wt 116.0 lb

## 2015-09-08 DIAGNOSIS — R634 Abnormal weight loss: Secondary | ICD-10-CM | POA: Diagnosis not present

## 2015-09-08 NOTE — Progress Notes (Signed)
Primary Care Physician: No PCP Per Patient  Primary Gastroenterologist:  Dr. [name]  Chief Complaint  Patient presents with  . Follow up colonoscopy  . Weight Loss  . Fatigue    HPI: Meredith Jones is a 61 y.o. female here for follow-up after having a colonoscopy. The patient had a significant amount of weight loss over the last year. The patient was treated and 50 pounds last year and now comes in today and 116. She states that she has gained 6 pounds since having her colonoscopy last week area the patient's colonoscopy showed a small polyp that was an adenoma and a repeat colonoscopy will be needed in 5 years. The patient did have a CT scan prior to being set up for a colonoscopy that showed diffuse small bowel thickening consistent with possible infectious versus inflammatory disease. It was also suggested that the patient may have celiac sprue. She suffers with thyroid disease and diabetes.  Current Outpatient Prescriptions  Medication Sig Dispense Refill  . bismuth subsalicylate (PEPTO BISMOL) 262 MG chewable tablet Chew 262 mg by mouth as needed.    . calcipotriene (DOVONOX) 0.005 % ointment CALCIPOTRIENE, 0.005% (External Ointment) - Historical Medication  apply to skin daily (0.005 %) Active    . clobetasol ointment (TEMOVATE) 0.05 % CLOBETASOL PROPIONATE, 0.05% (External Ointment) - Historical Medication  apply to skin daily (0.05 %) Active    . cycloSPORINE (RESTASIS) 0.05 % ophthalmic emulsion Place 1 drop into both eyes 2 (two) times daily.     . fexofenadine (ALLEGRA) 60 MG tablet Take 60 mg by mouth daily. Reported on 09/01/2015    . glipiZIDE (GLUCOTROL) 5 MG tablet TAKE ONE TABLET BEFORE MEALS TWICE A DAY 60 tablet 5  . glucose blood (ONE TOUCH ULTRA TEST) test strip Use as instructed 100 each 3  . metFORMIN (GLUCOPHAGE) 500 MG tablet Take 1 tablet (500 mg total) by mouth 2 (two) times daily with a meal. (Patient taking differently: Take 500 mg by mouth daily with  breakfast. And two in the evenings.) 180 tablet 3  . MULTIPLE VITAMIN PO MULTIVITAMINS (Oral Tablet)  1 Every Day for 0 days  Quantity: 0.00;  Refills: 0   Ordered :02-Oct-2010  Doy Hutching ;  Started 12-Mar-2007 Active    . naproxen sodium (ANAPROX) 220 MG tablet Take 220 mg by mouth as needed. Reported on 09/01/2015    . Polyvinyl Alcohol-Povidone (REFRESH OP) Apply to eye daily.    . simvastatin (ZOCOR) 20 MG tablet TAKE 1 TABLET BY MOUTH EVERY NIGHT AT BEDTIME. 90 tablet 1  . SYNTHROID 100 MCG tablet TAKE 1 TABLET BY MOUTH EVERY DAY. 30 tablet 5  . triamcinolone cream (KENALOG) 0.1 % Apply 1 application topically daily.     Marland Kitchen aspirin 81 MG tablet ASPIRIN, 81MG  (Oral Tablet Delayed Release)  1 a week for 0 days  Quantity: 0.00;  Refills: 0   Ordered :02-Oct-2010  Doy Hutching ;  Started 12-Mar-2007 Active     No current facility-administered medications for this visit.     Allergies as of 09/08/2015 - Review Complete 09/08/2015  Allergen Reaction Noted  . Clarithromycin Nausea Only 06/09/2014  . Doxycycline  06/09/2014  . Penicillins Hives 06/09/2014    ROS:  General: Negative for anorexia, weight loss, fever, chills, fatigue, weakness. ENT: Negative for hoarseness, difficulty swallowing , nasal congestion. CV: Negative for chest pain, angina, palpitations, dyspnea on exertion, peripheral edema.  Respiratory: Negative for dyspnea at rest, dyspnea on exertion, cough,  sputum, wheezing.  GI: See history of present illness. GU:  Negative for dysuria, hematuria, urinary incontinence, urinary frequency, nocturnal urination.  Endo: Negative for unusual weight change.    Physical Examination:   BP 100/62   Pulse 68   Temp 99 F (37.2 C) (Oral)   Ht 5' 3.5" (1.613 m)   Wt 116 lb (52.6 kg)   BMI 20.23 kg/m   General: Well-nourished, well-developed in no acute distress.  Eyes: No icterus. Conjunctivae pink. Skin: Warm and dry, no jaundice.   Psych: Alert and cooperative,  normal mood and affect.  Labs:    Imaging Studies: No results found.  Assessment and Plan:   Meredith Jones is a 60 y.o. y/o female who comes in for follow-up after having a colonoscopy. The patient has gained 6 ounces since the colonoscopy but is still down approximately 34 pounds from last year. The patient will have her blood sent off for a selective sprue panel because of her abnormal CT scan and has been told that if the sprue panel was negative she may need an upper endoscopy with biopsy the small bowel because of the CT scan showing diffuse small bowel wall thickening. The patient has been explained the plan and agrees with it.   Note: This dictation was prepared with Dragon dictation along with smaller phrase technology. Any transcriptional errors that result from this process are unintentional.

## 2015-09-12 LAB — GLIA (IGA/G) + TTG IGA
Antigliadin Abs, IgA: 3 units (ref 0–19)
Gliadin IgG: 4 units (ref 0–19)
Transglutaminase IgA: <2 U/mL (ref 0–3)

## 2015-09-14 ENCOUNTER — Telehealth: Payer: Self-pay

## 2015-09-14 NOTE — Telephone Encounter (Signed)
-----   Message from [name], MD sent at 09/14/2015  8:11 AM EDT ----- Let the patient know that her celiac sprue test was negative.

## 2015-09-14 NOTE — Telephone Encounter (Signed)
Pt notified of lab result. You stated in your last ov note if this celiac test was negative pt is to have an upper endoscopy with biopsy of the small bowel. Does she need to upper gi with small bowel follow through?

## 2015-09-19 NOTE — Telephone Encounter (Signed)
Yes please set the upper endoscopy up.

## 2015-09-21 NOTE — Telephone Encounter (Signed)
Patient called requesting to schedule her EGD in August. We are booked out until September 7th. Patient will call me back as soon as her work schedule comes out to schedule her EGD for Weight Loss R63.4 and Celiac K90.0

## 2015-09-21 NOTE — Telephone Encounter (Signed)
Left vm to schedule pt for an EGD per Dr. Dorothey Baseman request.

## 2015-09-26 ENCOUNTER — Other Ambulatory Visit: Payer: Self-pay

## 2015-09-26 NOTE — Telephone Encounter (Signed)
Gastroenterology Pre-Procedure Review  Request Date: 11/03/2015 Requesting Physician:   PATIENT REVIEW QUESTIONS: The patient responded to the following health history questions as indicated:    1. Are you having any GI issues? no 2. Do you have a personal history of Polyps? yes (benign) 3. Do you have a family history of Colon Cancer or Polyps? no 4. Diabetes Mellitus? yes (Type 2) 5. Joint replacements in the past 12 months?no 6. Major health problems in the past 3 months?no 7. Any artificial heart valves, MVP, or defibrillator?no    MEDICATIONS & ALLERGIES:    Patient reports the following regarding taking any anticoagulation/antiplatelet therapy:   Plavix, Coumadin, Eliquis, Xarelto, Lovenox, Pradaxa, Brilinta, or Effient? no Aspirin? yes (heart health)  Patient confirms/reports the following medications:  Current Outpatient Prescriptions  Medication Sig Dispense Refill  . aspirin 81 MG tablet ASPIRIN, 81MG  (Oral Tablet Delayed Release)  1 a week for 0 days  Quantity: 0.00;  Refills: 0   Ordered :02-Oct-2010  Doy Hutching ;  Started 12-Mar-2007 Active    . calcipotriene (DOVONOX) 0.005 % ointment CALCIPOTRIENE, 0.005% (External Ointment) - Historical Medication  apply to skin daily (0.005 %) Active    . clobetasol ointment (TEMOVATE) 0.05 % CLOBETASOL PROPIONATE, 0.05% (External Ointment) - Historical Medication  apply to skin daily (0.05 %) Active    . cycloSPORINE (RESTASIS) 0.05 % ophthalmic emulsion Place 1 drop into both eyes 2 (two) times daily.     . fexofenadine (ALLEGRA) 60 MG tablet Take 60 mg by mouth daily. Reported on 09/01/2015    . glipiZIDE (GLUCOTROL) 5 MG tablet TAKE ONE TABLET BEFORE MEALS TWICE A DAY 60 tablet 5  . glucose blood (ONE TOUCH ULTRA TEST) test strip Use as instructed 100 each 3  . metFORMIN (GLUCOPHAGE) 500 MG tablet Take 1 tablet (500 mg total) by mouth 2 (two) times daily with a meal. (Patient taking differently: Take 500 mg by mouth daily with  breakfast. And two in the evenings.) 180 tablet 3  . MULTIPLE VITAMIN PO MULTIVITAMINS (Oral Tablet)  1 Every Day for 0 days  Quantity: 0.00;  Refills: 0   Ordered :02-Oct-2010  Doy Hutching ;  Started 12-Mar-2007 Active    . Polyvinyl Alcohol-Povidone (REFRESH OP) Apply to eye daily.    . simvastatin (ZOCOR) 20 MG tablet TAKE 1 TABLET BY MOUTH EVERY NIGHT AT BEDTIME. 90 tablet 1  . SYNTHROID 100 MCG tablet TAKE 1 TABLET BY MOUTH EVERY DAY. 30 tablet 5  . triamcinolone cream (KENALOG) 0.1 % Apply 1 application topically daily.      No current facility-administered medications for this visit.     Patient confirms/reports the following allergies:  Allergies  Allergen Reactions  . Clarithromycin Nausea Only  . Doxycycline     nausea, dizziness.  Marland Kitchen Penicillins Hives    No orders of the defined types were placed in this encounter.   AUTHORIZATION INFORMATION Primary Insurance: 1D#: Group #:  Secondary Insurance: 1D#: Group #:  SCHEDULE INFORMATION: Date: 11/03/2015 Time: Location: [facility]

## 2015-09-26 NOTE — Telephone Encounter (Signed)
Celiac Disease K90.0 [facility] [id number] Please pre cert

## 2015-10-20 ENCOUNTER — Ambulatory Visit (INDEPENDENT_AMBULATORY_CARE_PROVIDER_SITE_OTHER): Payer: Commercial Managed Care - PPO | Admitting: Physician Assistant

## 2015-10-20 ENCOUNTER — Encounter: Payer: Self-pay | Admitting: Physician Assistant

## 2015-10-20 VITALS — BP 104/70 | HR 72 | Temp 98.8°F | Resp 16 | Ht 64.0 in | Wt 114.0 lb

## 2015-10-20 DIAGNOSIS — E7800 Pure hypercholesterolemia, unspecified: Secondary | ICD-10-CM | POA: Diagnosis not present

## 2015-10-20 DIAGNOSIS — Z0000 Encounter for general adult medical examination without abnormal findings: Secondary | ICD-10-CM | POA: Diagnosis not present

## 2015-10-20 DIAGNOSIS — Z1239 Encounter for other screening for malignant neoplasm of breast: Secondary | ICD-10-CM | POA: Diagnosis not present

## 2015-10-20 DIAGNOSIS — E039 Hypothyroidism, unspecified: Secondary | ICD-10-CM

## 2015-10-20 DIAGNOSIS — E119 Type 2 diabetes mellitus without complications: Secondary | ICD-10-CM | POA: Diagnosis not present

## 2015-10-20 NOTE — Progress Notes (Addendum)
[name], Female    DOB: 1955-11-10, 60 y.o.   MRN: [id number] Visit Date: 10/20/2015  Today's Provider: Mar Daring, PA-C   Chief Complaint  Patient presents with  . Annual Exam   Subjective:    Annual physical exam Meredith Jones is a 60 y.o. female who presents today for health maintenance and complete physical. She feels well. She reports exercising occasionally. She reports she is sleeping well.  Mammogram- about 2 years ago. Pap- 04/04/2011. Normal, HPV-neg. Colonoscopy- 09/02/2015, pre-canerous adenoma; repeat in 5 years.   Patient also mentions that she has been having unexplained weight loss over the last 4 months. Patient reports that she has had a colonoscopy in 08/2015 with Dr. Allen Norris and has a endoscopy scheduled on 11/03/2015. She has been tested for Celiac and testing was negative. She has had a CT abd/pelvis/chest which only revealed inflammation in the small bowel. Being that Celiac Sprue was negative the EGD has been scheduled next and to include a small bowel biopsy by Dr. Allen Norris.    Review of Systems  Constitutional: Positive for unexpected weight change. Negative for activity change, appetite change, chills, diaphoresis, fatigue and fever.  HENT: Negative.   Eyes: Negative.   Respiratory: Negative.   Cardiovascular: Negative.   Gastrointestinal: Negative.   Endocrine: Negative.   Genitourinary: Negative.   Musculoskeletal: Negative.   Skin: Negative.   Allergic/Immunologic: Negative.   Neurological: Negative.   Hematological: Negative.   Psychiatric/Behavioral: Negative.     Social History      She  reports that she has never smoked. She has never used smokeless tobacco. She reports that she does not drink alcohol or use drugs.       Social History   Social History  . Marital status: Single    Spouse name: N/A  . Number of children: N/A  . Years of education: N/A   Social History Main Topics  . Smoking status: Never  Smoker  . Smokeless tobacco: Never Used  . Alcohol use No  . Drug use: No  . Sexual activity: Not Asked   Other Topics Concern  . None   Social History Narrative  . None    Past Medical History:  Diagnosis Date  . Allergy   . Diabetes mellitus without complication ([place])   . Diverticulosis   . Hyperlipidemia   . Hypothyroidism   . Thyroid disease      Patient Active Problem List   Diagnosis Date Noted  . Loss of weight   . Benign neoplasm of descending colon   . Controlled type 2 diabetes mellitus without complication ([place]) [id number]  . Body mass index 28.0-28.9, adult 06/09/2014  . 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 Cyst of ovary 06/09/2014  . Bulky or enlarged uterus 06/09/2014  . Hypercholesteremia 02/09/2007  . Allergic rhinitis 11/27/2006  . Endometriosis 11/27/2006  . Adult hypothyroidism 11/27/2006    Past Surgical History:  Procedure Laterality Date  . BACK SURGERY  2007   Lumbar Surgery  . CATARACT EXTRACTION Left 2010   Right Eye 2011  . COLONOSCOPY WITH PROPOFOL N/A 09/02/2015   Procedure: COLONOSCOPY WITH PROPOFOL;  Surgeon: [name], MD;  Location: [place];  Service: Endoscopy;  Laterality: N/A;  Diabetic - oral meds  . EYE SURGERY Bilateral 06/2014    tear duck catherization ( Dr. Vallarie Mare)  . POLYPECTOMY  09/02/2015   Procedure: POLYPECTOMY;  Surgeon: [name], MD;  Location: [place];  Service: Endoscopy;;  . TONSILLECTOMY  1963    Family History        Family Status  Relation Status  . Mother Deceased at age 33  . Father Deceased at age 58  . Paternal Grandmother Deceased        Her family history includes Cancer in her father and paternal grandmother; Congestive Heart Failure in her mother; Diabetes in her mother; Hypothyroidism in her father; Stroke in her mother.    Allergies  Allergen Reactions  . Clarithromycin Nausea Only  . Doxycycline     nausea, dizziness.  Marland Kitchen Penicillins Hives    Current  Meds  Medication Sig  . aspirin 81 MG tablet ASPIRIN, 81MG  (Oral Tablet Delayed Release)  1 a week for 0 days  Quantity: 0.00;  Refills: 0   Ordered :02-Oct-2010  Doy Hutching ;  Started 12-Mar-2007 Active  . calcipotriene (DOVONOX) 0.005 % ointment CALCIPOTRIENE, 0.005% (External Ointment) - Historical Medication  apply to skin daily (0.005 %) Active  . clobetasol ointment (TEMOVATE) 0.05 % CLOBETASOL PROPIONATE, 0.05% (External Ointment) - Historical Medication  apply to skin daily (0.05 %) Active  . cycloSPORINE (RESTASIS) 0.05 % ophthalmic emulsion Place 1 drop into both eyes 2 (two) times daily.   . fexofenadine (ALLEGRA) 60 MG tablet Take 60 mg by mouth daily. Reported on 09/01/2015  . glipiZIDE (GLUCOTROL) 5 MG tablet TAKE ONE TABLET BEFORE MEALS TWICE A DAY  . glucose blood (ONE TOUCH ULTRA TEST) test strip Use as instructed  . metFORMIN (GLUCOPHAGE) 500 MG tablet Take 1 tablet (500 mg total) by mouth 2 (two) times daily with a meal. (Patient taking differently: Take 500 mg by mouth daily with breakfast. And two in the evenings.)  . MULTIPLE VITAMIN PO MULTIVITAMINS (Oral Tablet)  1 Every Day for 0 days  Quantity: 0.00;  Refills: 0   Ordered :02-Oct-2010  Doy Hutching ;  Started 12-Mar-2007 Active  . Polyvinyl Alcohol-Povidone (REFRESH OP) Apply to eye daily.  . simvastatin (ZOCOR) 20 MG tablet TAKE 1 TABLET BY MOUTH EVERY NIGHT AT BEDTIME.  . SYNTHROID 100 MCG tablet TAKE 1 TABLET BY MOUTH EVERY DAY.  Marland Kitchen triamcinolone cream (KENALOG) 0.1 % Apply 1 application topically daily.     Patient Care Team: Mar Daring, PA-C as PCP - General (Family Medicine)     Objective:   Vitals: BP 104/70   Pulse 72   Temp 98.8 F (37.1 C)   Resp 16   Ht 5\' 4"  (1.626 m)   Wt 114 lb (51.7 kg)   BMI 19.57 kg/m    Physical Exam  Constitutional: She is oriented to person, place, and time. Vital signs are normal. She appears well-developed. She appears cachectic. No distress.  HENT:    Head: Normocephalic and atraumatic.  Right Ear: Hearing, tympanic membrane, external ear and ear canal normal.  Left Ear: Hearing, tympanic membrane, external ear and ear canal normal.  Nose: Nose normal.  Mouth/Throat: Uvula is midline, oropharynx is clear and moist and mucous membranes are normal. No oropharyngeal exudate.  Eyes: Conjunctivae and EOM are normal. Pupils are equal, round, and reactive to light. Right eye exhibits no discharge. Left eye exhibits no discharge. No scleral icterus.  Neck: Normal range of motion. Neck supple. No JVD present. Carotid bruit is not present. No tracheal deviation present. 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 No thyromegaly present.  Cardiovascular: Normal rate, regular rhythm, normal heart sounds and intact distal pulses.  Exam reveals no gallop  and no friction rub.   No murmur heard. Pulmonary/Chest: Effort normal and breath sounds normal. No respiratory distress. She has no wheezes. She has no rales. She exhibits no tenderness. Right breast exhibits no inverted nipple, no mass, no nipple discharge, no skin change and no tenderness. Left breast exhibits no inverted nipple, no mass, no nipple discharge, no skin change and no tenderness. Breasts are symmetrical.  Abdominal: Soft. Bowel sounds are normal. She exhibits no distension and no mass. There is no tenderness. There is no rebound and no guarding.  Musculoskeletal: Normal range of motion. She exhibits no edema or tenderness.  Lymphadenopathy:    She has no cervical adenopathy.  Neurological: She is alert and oriented to person, place, and time.  Skin: Skin is warm and dry. No rash noted. She is not diaphoretic.  Psychiatric: She has a normal mood and affect. Her behavior is normal. Judgment and thought content normal.  Vitals reviewed.  Depression Screen PHQ 2/9 Scores 07/27/2014  PHQ - 2 Score 0    Assessment & Plan:     Routine Health Maintenance and Physical Exam  Exercise  Activities and Dietary recommendations Goals    . Exercise 150 minutes per week (moderate activity)    . Peak Blood Glucose < 180       Immunization History  Administered Date(s) Administered  . Tdap 01/16/2007    Health Maintenance  Topic Date Due  . Hepatitis C Screening  23-Jan-1956  . PNEUMOCOCCAL POLYSACCHARIDE VACCINE (1) 03/01/1957  . FOOT EXAM  03/01/1965  . OPHTHALMOLOGY EXAM  03/01/1965  . URINE MICROALBUMIN  03/01/1965  . HIV Screening  03/01/1970  . PAP SMEAR  04/03/2014  . HEMOGLOBIN A1C  01/27/2015  . ZOSTAVAX  03/02/2015  . INFLUENZA VACCINE  09/13/2015  . MAMMOGRAM  11/13/2015  . TETANUS/TDAP  01/15/2017  . COLONOSCOPY  09/01/2020      Discussed health benefits of physical activity, and encouraged her to engage in regular exercise appropriate for her age and condition.   1. Annual physical exam Normal physical exam today with exception of malnourishment. Will check labs as below and f/u pending lab results. If labs are stable and WNL she will not need to have these rechecked for one year at her next annual physical exam. She is to call the office in the meantime if she has any acute issue, questions or concerns. - CBC with Differential - Comprehensive Metabolic Panel (CMET)  2. Breast cancer screening Breast exam today was normal. There is no family history of breast cancer. She does perform regular self breast exams. Mammogram was ordered as below. Information for [facility] clinic was given to patient so she may schedule her mammogram at her convenience. - MM Digital Screening; Future  3. Hypothyroidism, unspecified hypothyroidism type Stable. Continue current medical treatment plan. Will check labs as below and f/u pending results. - Thyroid Panel With TSH  4. Controlled type 2 diabetes mellitus without complication, without long-term current use of insulin (HCC) Stable. Continue current medical treatment plan. Will check labs as below and f/u  pending results. - CBC with Differential - Comprehensive Metabolic Panel (CMET) - HgB A1c  5. Hypercholesteremia Will check labs as below and f/u pending results. - Lipid Profile   --------------------------------------------------------------------    Mar Daring, PA-C  [facility]

## 2015-10-20 NOTE — Patient Instructions (Signed)

## 2015-10-21 ENCOUNTER — Telehealth: Payer: Self-pay

## 2015-10-21 LAB — COMPREHENSIVE METABOLIC PANEL
A/G RATIO: 1.6 (ref 1.2–2.2)
ALBUMIN: 4 g/dL (ref 3.6–4.8)
ALK PHOS: 59 IU/L (ref 39–117)
ALT: 25 IU/L (ref 0–32)
AST: 29 IU/L (ref 0–40)
BILIRUBIN TOTAL: 0.2 mg/dL (ref 0.0–1.2)
BUN / CREAT RATIO: 15 (ref 12–28)
BUN: 10 mg/dL (ref 8–27)
CHLORIDE: 98 mmol/L (ref 96–106)
CO2: 29 mmol/L (ref 18–29)
Calcium: 9.9 mg/dL (ref 8.7–10.3)
Creatinine, Ser: 0.65 mg/dL (ref 0.57–1.00)
GFR calc non Af Amer: 97 mL/min/{1.73_m2} (ref >59)
GFR, EST AFRICAN AMERICAN: 112 mL/min/{1.73_m2} (ref >59)
GLOBULIN, TOTAL: 2.5 g/dL (ref 1.5–4.5)
Glucose: 93 mg/dL (ref 65–99)
Potassium: 4.3 mmol/L (ref 3.5–5.2)
SODIUM: 142 mmol/L (ref 134–144)
TOTAL PROTEIN: 6.5 g/dL (ref 6.0–8.5)

## 2015-10-21 LAB — CBC WITH DIFFERENTIAL/PLATELET
BASOS: 1 %
Basophils Absolute: 0.1 10*3/uL (ref 0.0–0.2)
EOS (ABSOLUTE): 0.5 10*3/uL — ABNORMAL HIGH (ref 0.0–0.4)
EOS: 9 %
HEMATOCRIT: 33.8 % — AB (ref 34.0–46.6)
HEMOGLOBIN: 11.8 g/dL (ref 11.1–15.9)
Immature Grans (Abs): 0 10*3/uL (ref 0.0–0.1)
Immature Granulocytes: 0 %
Lymphocytes Absolute: 1.7 10*3/uL (ref 0.7–3.1)
Lymphs: 30 %
MCH: 33.9 pg — AB (ref 26.6–33.0)
MCHC: 34.9 g/dL (ref 31.5–35.7)
MCV: 97 fL (ref 79–97)
MONOCYTES: 10 %
Monocytes Absolute: 0.5 10*3/uL (ref 0.1–0.9)
NEUTROS ABS: 2.8 10*3/uL (ref 1.4–7.0)
Neutrophils: 50 %
Platelets: 256 10*3/uL (ref 150–379)
RBC: 3.48 x10E6/uL — ABNORMAL LOW (ref 3.77–5.28)
RDW: 12.6 % (ref 12.3–15.4)
WBC: 5.5 10*3/uL (ref 3.4–10.8)

## 2015-10-21 LAB — HEMOGLOBIN A1C
Est. average glucose Bld gHb Est-mCnc: 134 mg/dL
HEMOGLOBIN A1C: 6.3 % — AB (ref 4.8–5.6)

## 2015-10-21 LAB — THYROID PANEL WITH TSH
FREE THYROXINE INDEX: 2.6 (ref 1.2–4.9)
T3 Uptake Ratio: 26 % (ref 24–39)
T4, Total: 9.9 ug/dL (ref 4.5–12.0)
TSH: 0.501 u[IU]/mL (ref 0.450–4.500)

## 2015-10-21 LAB — LIPID PANEL
Chol/HDL Ratio: 2.2 ratio units (ref 0.0–4.4)
Cholesterol, Total: 157 mg/dL (ref 100–199)
HDL: 73 mg/dL (ref >39)
LDL CALC: 66 mg/dL (ref 0–99)
Triglycerides: 88 mg/dL (ref 0–149)
VLDL CHOLESTEROL CAL: 18 mg/dL (ref 5–40)

## 2015-10-21 NOTE — Telephone Encounter (Signed)
Patient advised as directed below. Patient verbalized understanding.  Thanks,  -[place] 

## 2015-10-21 NOTE — Telephone Encounter (Signed)
-----   Message from Mar Daring, PA-C sent at 10/21/2015  8:34 AM EDT ----- All labs are within normal limits and stable.  HgBA1c has improved from 12.9 to 6.3 which is excellent. Continue current medical treatments and f/u with Dr. Allen Norris for weight loss and endoscopy on 11/03/15. Thanks! -JB

## 2015-11-01 NOTE — Discharge Instructions (Signed)

## 2015-11-03 ENCOUNTER — Ambulatory Visit: Payer: Commercial Managed Care - PPO | Admitting: Anesthesiology

## 2015-11-03 ENCOUNTER — Encounter
Admission: RE | Disposition: A | Discharge status: Home / Self Care | Payer: Self-pay | Source: Ambulatory Visit | Attending: Gastroenterology

## 2015-11-03 ENCOUNTER — Ambulatory Visit
Admission: RE | Admit: 2015-11-03 | Discharge: 2015-11-03 | Disposition: A | Discharge status: Home / Self Care | Payer: Commercial Managed Care - PPO | Source: Ambulatory Visit | Attending: Gastroenterology | Admitting: Gastroenterology

## 2015-11-03 DIAGNOSIS — Z79899 Other long term (current) drug therapy: Secondary | ICD-10-CM | POA: Insufficient documentation

## 2015-11-03 DIAGNOSIS — Z7984 Long term (current) use of oral hypoglycemic drugs: Secondary | ICD-10-CM | POA: Insufficient documentation

## 2015-11-03 DIAGNOSIS — Z7982 Long term (current) use of aspirin: Secondary | ICD-10-CM | POA: Insufficient documentation

## 2015-11-03 DIAGNOSIS — R634 Abnormal weight loss: Secondary | ICD-10-CM | POA: Insufficient documentation

## 2015-11-03 DIAGNOSIS — E039 Hypothyroidism, unspecified: Secondary | ICD-10-CM | POA: Insufficient documentation

## 2015-11-03 DIAGNOSIS — E119 Type 2 diabetes mellitus without complications: Secondary | ICD-10-CM | POA: Diagnosis not present

## 2015-11-03 DIAGNOSIS — E785 Hyperlipidemia, unspecified: Secondary | ICD-10-CM | POA: Diagnosis not present

## 2015-11-03 HISTORY — PX: ESOPHAGOGASTRODUODENOSCOPY (EGD) WITH PROPOFOL: SHX5813

## 2015-11-03 LAB — GLUCOSE, CAPILLARY
GLUCOSE-CAPILLARY: 106 mg/dL — AB (ref 65–99)
Glucose-Capillary: 114 mg/dL — ABNORMAL HIGH (ref 65–99)

## 2015-11-03 SURGERY — ESOPHAGOGASTRODUODENOSCOPY (EGD) WITH PROPOFOL
Anesthesia: Monitor Anesthesia Care | Wound class: Clean Contaminated

## 2015-11-03 MED ORDER — LIDOCAINE HCL (CARDIAC) 20 MG/ML IV SOLN
INTRAVENOUS | Status: DC | PRN
  Administered 2015-11-03: 50 mg via INTRAVENOUS

## 2015-11-03 MED ORDER — LACTATED RINGERS IV SOLN
INTRAVENOUS | Status: DC
  Administered 2015-11-03: 10:00:00 via INTRAVENOUS

## 2015-11-03 MED ORDER — GLYCOPYRROLATE 0.2 MG/ML IJ SOLN
INTRAMUSCULAR | Status: DC | PRN
  Administered 2015-11-03: 0.2 mg via INTRAVENOUS

## 2015-11-03 MED ORDER — PROPOFOL 10 MG/ML IV BOLUS
INTRAVENOUS | Status: DC | PRN
  Administered 2015-11-03: 50 mg via INTRAVENOUS

## 2015-11-03 SURGICAL SUPPLY — 32 items

## 2015-11-03 NOTE — Anesthesia Procedure Notes (Signed)
Procedure Name: MAC Date/Time: 11/03/2015 11:13 AM Performed by: Janna Arch Pre-anesthesia Checklist: Patient identified, Emergency Drugs available and Patient being monitored Patient Re-evaluated:Patient Re-evaluated prior to inductionOxygen Delivery Method: Nasal cannula

## 2015-11-03 NOTE — Transfer of Care (Signed)
Immediate Anesthesia Transfer of Care Note  [name]  Procedure(s) Performed: Procedure(s): ESOPHAGOGASTRODUODENOSCOPY (EGD) WITH PROPOFOL (N/A)  Patient Location: PACU  Anesthesia Type: MAC  Level of Consciousness: awake, alert  and patient cooperative  Airway and Oxygen Therapy: Patient Spontanous Breathing and Patient connected to supplemental oxygen  Post-op Assessment: Post-op Vital signs reviewed, Patient's Cardiovascular Status Stable, Respiratory Function Stable, Patent Airway and No signs of Nausea or vomiting  Post-op Vital Signs: Reviewed and stable  Complications: No apparent anesthesia complications

## 2015-11-03 NOTE — Anesthesia Postprocedure Evaluation (Signed)
Anesthesia Post Note  [name]  Procedure(s) Performed: Procedure(s) (LRB): ESOPHAGOGASTRODUODENOSCOPY (EGD) WITH PROPOFOL (N/A)  Patient location during evaluation: PACU Anesthesia Type: MAC Level of consciousness: awake and alert Pain management: pain level controlled Vital Signs Assessment: post-procedure vital signs reviewed and stable Respiratory status: spontaneous breathing, nonlabored ventilation, respiratory function stable and patient connected to nasal cannula oxygen Cardiovascular status: stable and blood pressure returned to baseline Anesthetic complications: no    Anastashia Westerfeld

## 2015-11-03 NOTE — Op Note (Signed)
[facility] Gastroenterology Patient Name: Meredith Jones Procedure Date: 11/03/2015 11:10 AM MRN: [id number] Account #: [id number] Date of Birth: 06-17-1955 Admit Type: Outpatient Age: 60 Room: [facility] OR ROOM 01 Gender: Female Note Status: Finalized Procedure:            Upper GI endoscopy Indications:          Weight loss Providers:            [name] MD, MD Referring MD:         Mar Daring (Referring MD) Medicines:            Propofol per Anesthesia Complications:        No immediate complications. Procedure:            Pre-Anesthesia Assessment:                       - Prior to the procedure, a History and Physical was                        performed, and patient medications and allergies were                        reviewed. The patient's tolerance of previous                        anesthesia was also reviewed. The risks and benefits of                        the procedure and the sedation options and risks were                        discussed with the patient. All questions were                        answered, and informed consent was obtained. Prior                        Anticoagulants: The patient has taken no previous                        anticoagulant or antiplatelet agents. ASA Grade                        Assessment: II - A patient with mild systemic disease.                        After reviewing the risks and benefits, the patient was                        deemed in satisfactory condition to undergo the                        procedure.                       After obtaining informed consent, the endoscope was                        passed under direct vision. Throughout the procedure,  the patient's blood pressure, pulse, and oxygen                        saturations were monitored continuously. The was                        introduced through the mouth, and advanced to the                        second  part of duodenum. The upper GI endoscopy was                        accomplished without difficulty. The patient tolerated                        the procedure well. Findings:      The examined esophagus was normal.      The stomach was normal.      The examined duodenum was normal. Biopsies were taken with a cold       forceps for histology. Impression:           - Normal esophagus.                       - Normal stomach.                       - Normal examined duodenum. Biopsied. Recommendation:       - Await pathology results.                       - Discharge patient to home.                       - Resume previous diet.                       - Continue present medications. Procedure Code(s):    --- Professional ---                       [phone], Esophagogastroduodenoscopy, flexible, transoral;                        with biopsy, single or multiple Diagnosis Code(s):    --- Professional ---                       R63.4, Abnormal weight loss CPT copyright 2016 American Medical Association. All rights reserved. The codes documented in this report are preliminary and upon coder review may  be revised to meet current compliance requirements. [name] MD, MD 11/03/2015 11:20:49 AM This report has been signed electronically. Number of Addenda: 0 Note Initiated On: 11/03/2015 11:10 AM Total Procedure Duration: 0 hours 2 minutes 30 seconds       [facility]

## 2015-11-03 NOTE — H&P (Signed)
[name], MD [facility] [address] [place], [place] [address] Phone: [phone] Fax : [phone]  Primary Care Physician:  Mar Daring, PA-C Primary Gastroenterologist:  Dr. Allen Norris  Pre-Procedure History & Physical: HPI:  Meredith Jones is a 60 y.o. female is here for an endoscopy.   Past Medical History:  Diagnosis Date  . Allergy   . Diabetes mellitus without complication ([place])   . Diverticulosis   . Hyperlipidemia   . Hypothyroidism   . Thyroid disease     Past Surgical History:  Procedure Laterality Date  . BACK SURGERY  2007   Lumbar Surgery  . CATARACT EXTRACTION Left 2010   Right Eye 2011  . COLONOSCOPY WITH PROPOFOL N/A 09/02/2015   Procedure: COLONOSCOPY WITH PROPOFOL;  Surgeon: [name], MD;  Location: [place];  Service: Endoscopy;  Laterality: N/A;  Diabetic - oral meds  . EYE SURGERY Bilateral 06/2014    tear duck catherization ( Dr. Vallarie Mare)  . POLYPECTOMY  09/02/2015   Procedure: POLYPECTOMY;  Surgeon: [name], MD;  Location: [place];  Service: Endoscopy;;  . TONSILLECTOMY  1963    Prior to Admission medications   Medication Sig Start Date End Date Taking? Authorizing Provider  aspirin 81 MG tablet ASPIRIN, 81MG  (Oral Tablet Delayed Release)  1 a week for 0 days  Quantity: 0.00;  Refills: 0   Ordered :02-Oct-2010  Doy Hutching ;  Started 12-Mar-2007 Active 03/12/07  Yes Historical Provider, MD  calcipotriene (DOVONOX) 0.005 % ointment CALCIPOTRIENE, 0.005% (External Ointment) - Historical Medication  apply to skin daily (0.005 %) Active   Yes Historical Provider, MD  clobetasol ointment (TEMOVATE) 0.05 % CLOBETASOL PROPIONATE, 0.05% (External Ointment) - Historical Medication  apply to skin daily (0.05 %) Active   Yes Historical Provider, MD  cycloSPORINE (RESTASIS) 0.05 % ophthalmic emulsion Place 1 drop into both eyes 2 (two) times daily.    Yes Historical Provider, MD  fexofenadine (ALLEGRA) 60 MG  tablet Take 60 mg by mouth daily. Reported on 09/01/2015   Yes Historical Provider, MD  glipiZIDE (GLUCOTROL) 5 MG tablet TAKE ONE TABLET BEFORE MEALS TWICE A DAY Patient taking differently: once a day 07/06/15  Yes Margarita Rana, MD  glucose blood (ONE TOUCH ULTRA TEST) test strip Use as instructed 12/24/14  Yes Margarita Rana, MD  metFORMIN (GLUCOPHAGE) 500 MG tablet Take 1 tablet (500 mg total) by mouth 2 (two) times daily with a meal. Patient taking differently: Take 500 mg by mouth daily with breakfast. And two in the evenings. 07/27/14  Yes Margarita Rana, MD  MULTIPLE VITAMIN PO MULTIVITAMINS (Oral Tablet)  1 Every Day for 0 days  Quantity: 0.00;  Refills: 0   Ordered :02-Oct-2010  Doy Hutching ;  Started 12-Mar-2007 Active 03/12/07  Yes Historical Provider, MD  Polyvinyl Alcohol-Povidone (REFRESH OP) Apply to eye daily.   Yes Historical Provider, MD  simvastatin (ZOCOR) 20 MG tablet TAKE 1 TABLET BY MOUTH EVERY NIGHT AT BEDTIME. 06/10/15  Yes Margarita Rana, MD  SYNTHROID 100 MCG tablet TAKE 1 TABLET BY MOUTH EVERY DAY. 08/05/15  Yes Margarita Rana, MD  triamcinolone cream (KENALOG) 0.1 % Apply 1 application topically daily.    Yes Kennieth Francois, MD    Allergies as of 09/26/2015 - Review Complete 09/26/2015  Allergen Reaction Noted  . Clarithromycin Nausea Only 06/09/2014  . Doxycycline  06/09/2014  . Penicillins Hives 06/09/2014    Family History  Problem Relation Age of Onset  . Congestive Heart  Failure Mother   . Diabetes Mother   . Stroke Mother   . Hypothyroidism Father   . Cancer Father     lung cancer  . Cancer Paternal Grandmother     stomach    Social History   Social History  . Marital status: Single    Spouse name: N/A  . Number of children: N/A  . Years of education: N/A   Occupational History  . Not on file.   Social History Main Topics  . Smoking status: Never Smoker  . Smokeless tobacco: Never Used  . Alcohol use No  . Drug use: No  . Sexual activity:  Not on file   Other Topics Concern  . Not on file   Social History Narrative  . No narrative on file    Review of Systems: See HPI, otherwise negative ROS  Physical Exam: BP 104/75   Pulse 60   Temp 98.6 F (37 C) (Temporal)   Resp 16   Ht 5\' 4"  (1.626 m)   Wt 112 lb (50.8 kg)   SpO2 99%   BMI 19.22 kg/m  General:   Alert,  pleasant and cooperative in NAD Head:  Normocephalic and atraumatic. Neck:  Supple; no masses or thyromegaly. Lungs:  Clear throughout to auscultation.    Heart:  Regular rate and rhythm. Abdomen:  Soft, nontender and nondistended. Normal bowel sounds, without guarding, and without rebound.   Neurologic:  Alert and  oriented x4;  grossly normal neurologically.  Impression/Plan: Meredith Jones is here for an endoscopy to be performed for weight loss  Risks, benefits, limitations, and alternatives regarding  endoscopy have been reviewed with the patient.  Questions have been answered.  All parties agreeable.   [name], MD  11/03/2015, 10:05 AM

## 2015-11-03 NOTE — Anesthesia Preprocedure Evaluation (Signed)
Anesthesia Evaluation  Patient identified by MRN, date of birth, ID band Patient awake    Reviewed: Allergy & Precautions, NPO status , Patient's Chart, lab work & pertinent test results  History of Anesthesia Complications Negative for: history of anesthetic complications  Airway Mallampati: II  TM Distance: >3 FB Neck ROM: Full    Dental no notable dental hx.    Pulmonary neg pulmonary ROS,    Pulmonary exam normal        Cardiovascular Exercise Tolerance: Good negative cardio ROS Normal cardiovascular exam     Neuro/Psych negative neurological ROS  negative psych ROS   GI/Hepatic negative GI ROS, Neg liver ROS,   Endo/Other  diabetesHypothyroidism   Renal/GU negative Renal ROS  negative genitourinary   Musculoskeletal negative musculoskeletal ROS (+)   Abdominal   Peds  Hematology negative hematology ROS (+)   Anesthesia Other Findings   Reproductive/Obstetrics                             Anesthesia Physical Anesthesia Plan  ASA: II  Anesthesia Plan: MAC   Post-op Pain Management:    Induction:   Airway Management Planned:   Additional Equipment:   Intra-op Plan:   Post-operative Plan:   Informed Consent: I have reviewed the patients History and Physical, chart, labs and discussed the procedure including the risks, benefits and alternatives for the proposed anesthesia with the patient or authorized representative who has indicated his/her understanding and acceptance.     Plan Discussed with: CRNA  Anesthesia Plan Comments:         Anesthesia Quick Evaluation

## 2015-11-04 ENCOUNTER — Encounter: Payer: Self-pay | Admitting: Gastroenterology

## 2015-11-08 ENCOUNTER — Other Ambulatory Visit: Payer: Self-pay | Admitting: Family Medicine

## 2015-11-08 DIAGNOSIS — E1165 Type 2 diabetes mellitus with hyperglycemia: Principal | ICD-10-CM

## 2015-11-08 DIAGNOSIS — IMO0001 Reserved for inherently not codable concepts without codable children: Secondary | ICD-10-CM

## 2015-11-08 NOTE — Telephone Encounter (Signed)
LOV with you 10/20/2015. Renaldo Fiddler, CMA

## 2015-11-09 ENCOUNTER — Encounter: Payer: Self-pay | Admitting: Gastroenterology

## 2015-11-09 ENCOUNTER — Telehealth: Payer: Self-pay

## 2015-11-09 NOTE — Telephone Encounter (Signed)
LVM for pt to return my call.

## 2015-11-09 NOTE — Telephone Encounter (Signed)
-----   Message from [name], MD sent at 11/09/2015  8:15 AM EDT ----- Let the patient know that her biopsies were not consistent with celiac sprue.

## 2015-11-10 NOTE — Telephone Encounter (Signed)
Pt notified of EGD results.  

## 2015-11-23 ENCOUNTER — Encounter: Payer: Self-pay | Admitting: Physician Assistant

## 2015-11-24 ENCOUNTER — Other Ambulatory Visit: Payer: Self-pay | Admitting: Family Medicine

## 2015-11-28 NOTE — Telephone Encounter (Signed)
LOV/CPE with you 10/20/2015. Renaldo Fiddler, CMA

## 2015-12-13 ENCOUNTER — Other Ambulatory Visit: Payer: Self-pay

## 2015-12-13 DIAGNOSIS — E7800 Pure hypercholesterolemia, unspecified: Secondary | ICD-10-CM

## 2015-12-13 MED ORDER — SIMVASTATIN 20 MG PO TABS: 20.0000 mg | ORAL_TABLET | Freq: Every day | ORAL | 3 refills | Status: DC

## 2015-12-13 NOTE — Telephone Encounter (Signed)
Patient requesting refill. 

## 2015-12-16 ENCOUNTER — Ambulatory Visit
Admission: RE | Admit: 2015-12-16 | Discharge: 2015-12-16 | Disposition: A | Discharge status: Home / Self Care | Payer: Commercial Managed Care - PPO | Source: Ambulatory Visit | Attending: Physician Assistant | Admitting: Physician Assistant

## 2015-12-16 DIAGNOSIS — Z1231 Encounter for screening mammogram for malignant neoplasm of breast: Secondary | ICD-10-CM | POA: Diagnosis present

## 2015-12-16 DIAGNOSIS — Z1239 Encounter for other screening for malignant neoplasm of breast: Secondary | ICD-10-CM

## 2015-12-19 ENCOUNTER — Telehealth: Payer: Self-pay

## 2015-12-19 NOTE — Telephone Encounter (Signed)
-----   Message from Mar Daring, [place] sent at 12/19/2015  8:44 AM EST ----- Normal mammogram. Repeat screening in one year.

## 2015-12-19 NOTE — Telephone Encounter (Signed)
[id number] 

## 2015-12-19 NOTE — Telephone Encounter (Signed)
Pt is returning call.  CB#[phone] or [phone]

## 2015-12-19 NOTE — Telephone Encounter (Signed)
Patient was advised. KW 

## 2015-12-28 ENCOUNTER — Encounter: Payer: Self-pay | Admitting: Physician Assistant

## 2016-04-18 ENCOUNTER — Ambulatory Visit: Payer: Commercial Managed Care - PPO | Admitting: Physician Assistant

## 2016-04-19 ENCOUNTER — Ambulatory Visit: Payer: Commercial Managed Care - PPO | Admitting: Physician Assistant

## 2016-05-11 ENCOUNTER — Ambulatory Visit (INDEPENDENT_AMBULATORY_CARE_PROVIDER_SITE_OTHER): Payer: Commercial Managed Care - PPO | Admitting: Physician Assistant

## 2016-05-11 ENCOUNTER — Encounter: Payer: Self-pay | Admitting: Physician Assistant

## 2016-05-11 VITALS — BP 80/60 | HR 60 | Temp 98.0°F | Resp 16 | Ht 63.0 in | Wt 119.4 lb

## 2016-05-11 DIAGNOSIS — E7800 Pure hypercholesterolemia, unspecified: Secondary | ICD-10-CM

## 2016-05-11 DIAGNOSIS — E119 Type 2 diabetes mellitus without complications: Secondary | ICD-10-CM

## 2016-05-11 DIAGNOSIS — E039 Hypothyroidism, unspecified: Secondary | ICD-10-CM

## 2016-05-11 LAB — POCT GLYCOSYLATED HEMOGLOBIN (HGB A1C)
Est. average glucose Bld gHb Est-mCnc: 154
Hemoglobin A1C: 7

## 2016-05-11 MED ORDER — GLIPIZIDE 5 MG PO TABS: 5.0000 mg | ORAL_TABLET | Freq: Every day | ORAL | 1 refills | Status: DC

## 2016-05-11 MED ORDER — SIMVASTATIN 20 MG PO TABS: 20.0000 mg | ORAL_TABLET | Freq: Every day | ORAL | 1 refills | Status: DC

## 2016-05-11 MED ORDER — LEVOTHYROXINE SODIUM 100 MCG PO TABS: 100.0000 ug | ORAL_TABLET | Freq: Every day | ORAL | 1 refills | Status: DC

## 2016-05-11 MED ORDER — METFORMIN HCL 500 MG PO TABS: 500.0000 mg | ORAL_TABLET | Freq: Two times a day (BID) | ORAL | 1 refills | Status: DC

## 2016-05-11 NOTE — Patient Instructions (Signed)
Diabetes Mellitus and Food It is important for you to manage your blood sugar (glucose) level. Your blood glucose level can be greatly affected by what you eat. Eating healthier foods in the appropriate amounts throughout the day at about the same time each day will help you control your blood glucose level. It can also help slow or prevent worsening of your diabetes mellitus. Healthy eating may even help you improve the level of your blood pressure and reach or maintain a healthy weight. General recommendations for healthful eating and cooking habits include:  Eating meals and snacks regularly. Avoid going long periods of time without eating to lose weight.  Eating a diet that consists mainly of plant-based foods, such as fruits, vegetables, nuts, legumes, and whole grains.  Using low-heat cooking methods, such as baking, instead of high-heat cooking methods, such as deep frying.  Work with your dietitian to make sure you understand how to use the Nutrition Facts information on food labels. How can food affect me? Carbohydrates Carbohydrates affect your blood glucose level more than any other type of food. Your dietitian will help you determine how many carbohydrates to eat at each meal and teach you how to count carbohydrates. Counting carbohydrates is important to keep your blood glucose at a healthy level, especially if you are using insulin or taking certain medicines for diabetes mellitus. Alcohol Alcohol can cause sudden decreases in blood glucose (hypoglycemia), especially if you use insulin or take certain medicines for diabetes mellitus. Hypoglycemia can be a life-threatening condition. Symptoms of hypoglycemia (sleepiness, dizziness, and disorientation) are similar to symptoms of having too much alcohol. If your health care provider has given you approval to drink alcohol, do so in moderation and use the following guidelines:  Women should not have more than one drink per day, and men  should not have more than two drinks per day. One drink is equal to: ? 12 oz of beer. ? 5 oz of wine. ? 1 oz of hard liquor.  Do not drink on an empty stomach.  Keep yourself hydrated. Have water, diet soda, or unsweetened iced tea.  Regular soda, juice, and other mixers might contain a lot of carbohydrates and should be counted.  What foods are not recommended? As you make food choices, it is important to remember that all foods are not the same. Some foods have fewer nutrients per serving than other foods, even though they might have the same number of calories or carbohydrates. It is difficult to get your body what it needs when you eat foods with fewer nutrients. Examples of foods that you should avoid that are high in calories and carbohydrates but low in nutrients include:  Trans fats (most processed foods list trans fats on the Nutrition Facts label).  Regular soda.  Juice.  Candy.  Sweets, such as cake, pie, doughnuts, and cookies.  Fried foods.  What foods can I eat? Eat nutrient-rich foods, which will nourish your body and keep you healthy. The food you should eat also will depend on several factors, including:  The calories you need.  The medicines you take.  Your weight.  Your blood glucose level.  Your blood pressure level.  Your cholesterol level.  You should eat a variety of foods, including:  Protein. ? Lean cuts of meat. ? Proteins low in saturated fats, such as fish, egg whites, and beans. Avoid processed meats.  Fruits and vegetables. ? Fruits and vegetables that may help control blood glucose levels, such as apples,   mangoes, and yams.  Dairy products. ? Choose fat-free or low-fat dairy products, such as milk, yogurt, and cheese.  Grains, bread, pasta, and rice. ? Choose whole grain products, such as multigrain bread, whole oats, and brown rice. These foods may help control blood pressure.  Fats. ? Foods containing healthful fats, such as  nuts, avocado, olive oil, canola oil, and fish.  Does everyone with diabetes mellitus have the same meal plan? Because every person with diabetes mellitus is different, there is not one meal plan that works for everyone. It is very important that you meet with a dietitian who will help you create a meal plan that is just right for you. This information is not intended to replace advice given to you by your health care provider. Make sure you discuss any questions you have with your health care provider. Document Released: 10/26/2004 Document Revised: 07/07/2015 Document Reviewed: 12/26/2012 Elsevier Interactive Patient Education  2017 [employer]  

## 2016-05-11 NOTE — Progress Notes (Signed)
[name] Female    DOB: April 30, 1955   61 y.o.   MRN: [id number] Visit Date: 05/11/2016  Today's Provider: Mar Daring, PA-C   Chief Complaint  Patient presents with  . Diabetes  . Hyperlipidemia   Subjective:    HPI  Diabetes Mellitus Type II, Follow-up:   Lab Results  Component Value Date   HGBA1C 7.0 05/11/2016   HGBA1C 6.3 (H) 10/20/2015   HGBA1C 12.9 (H) 07/28/2014    Last seen for diabetes 6 months ago.  Management since then includes no changes. She reports excellent compliance with treatment. She is not having side effects.  Current symptoms include none and have been stable. Home blood sugar records: fasting range: 90's - 120's  Episodes of hypoglycemia? no   Current Insulin Regimen: none Most Recent Eye Exam: UTD Weight trend: stable Prior visit with dietician: no Current diet: in general, a "healthy" diet   Current exercise: walking  Pertinent Labs:    Component Value Date/Time   CHOL 157 10/20/2015 1002   TRIG 88 10/20/2015 1002   HDL 73 10/20/2015 1002   LDLCALC 66 10/20/2015 1002   CREATININE 0.65 10/20/2015 1002    Wt Readings from Last 3 Encounters:  05/11/16 119 lb 6.4 oz (54.2 kg)  11/03/15 112 lb (50.8 kg)  10/20/15 114 lb (51.7 kg)    ------------------------------------------------------------------------   Lipid/Cholesterol, Follow-up:   Last seen for this 6 months ago.  Management changes since that visit include no changes. . Last Lipid Panel:    Component Value Date/Time   CHOL 157 10/20/2015 1002   TRIG 88 10/20/2015 1002   HDL 73 10/20/2015 1002   CHOLHDL 2.2 10/20/2015 1002   LDLCALC 66 10/20/2015 1002    Risk factors for vascular disease include diabetes mellitus  She reports excellent compliance with treatment. She is not having side effects.  Current symptoms include none and have been stable. Weight trend: stable Prior visit with dietician: no Current diet: in general, a  "healthy" diet   Current exercise: walking  Wt Readings from Last 3 Encounters:  05/11/16 119 lb 6.4 oz (54.2 kg)  11/03/15 112 lb (50.8 kg)  10/20/15 114 lb (51.7 kg)    -------------------------------------------------------------------     Allergies  Allergen Reactions  . Clarithromycin Nausea Only  . Doxycycline     nausea, dizziness.  Marland Kitchen Penicillins Hives     Current Outpatient Prescriptions:  .  aspirin 81 MG tablet, ASPIRIN, 81MG  (Oral Tablet Delayed Release)  1 a week for 0 days  Quantity: 0.00;  Refills: 0   Ordered :02-Oct-2010  Doy Hutching ;  Started 12-Mar-2007 Active, Disp: , Rfl:  .  calcipotriene (DOVONOX) 0.005 % ointment, CALCIPOTRIENE, 0.005% (External Ointment) - Historical Medication  apply to skin daily (0.005 %) Active, Disp: , Rfl:  .  cycloSPORINE (RESTASIS) 0.05 % ophthalmic emulsion, Place 1 drop into both eyes 2 (two) times daily. , Disp: , Rfl:  .  fexofenadine (ALLEGRA) 60 MG tablet, Take 60 mg by mouth daily. Reported on 09/01/2015, Disp: , Rfl:  .  glipiZIDE (GLUCOTROL) 5 MG tablet, TAKE ONE TABLET BEFORE MEALS TWICE A DAY (Patient taking differently: once a day), Disp: 60 tablet, Rfl: 5 .  metFORMIN (GLUCOPHAGE) 500 MG tablet, Take 1 tablet (500 mg total) by mouth 2 (two) times daily with a meal. (Patient taking differently: Take 500 mg by mouth daily with breakfast. And two in the evenings.), Disp: 180 tablet, Rfl: 3 .  MULTIPLE VITAMIN PO, MULTIVITAMINS (Oral Tablet)  1 Every Day for 0 days  Quantity: 0.00;  Refills: 0   Ordered :02-Oct-2010  Doy Hutching ;  Started 12-Mar-2007 Active, Disp: , Rfl:  .  ONE TOUCH ULTRA TEST test strip, USE TO TEST BLOOD SUGAR TWICE A DAY AS INSTRUCTED, Disp: 100 each, Rfl: 12 .  ONETOUCH DELICA LANCETS 70J MISC, USE TO TEST BLOOD SUGAR ONCE A DAY, Disp: 100 each, Rfl: 12 .  Polyvinyl Alcohol-Povidone (REFRESH OP), Apply to eye daily., Disp: , Rfl:  .  simvastatin (ZOCOR) 20 MG tablet, Take 1 tablet (20 mg  total) by mouth at bedtime., Disp: 90 tablet, Rfl: 3 .  SYNTHROID 100 MCG tablet, TAKE 1 TABLET BY MOUTH EVERY DAY., Disp: 30 tablet, Rfl: 5 .  clobetasol ointment (TEMOVATE) 0.05 %, CLOBETASOL PROPIONATE, 0.05% (External Ointment) - Historical Medication  apply to skin daily (0.05 %) Active, Disp: , Rfl:  .  triamcinolone cream (KENALOG) 0.1 %, Apply 1 application topically daily. , Disp: , Rfl:   Review of Systems  Constitutional: Negative.   Respiratory: Negative.   Cardiovascular: Negative.   Gastrointestinal: Negative.   Endocrine: Negative.   Neurological: Negative.   Psychiatric/Behavioral: Negative.     Social History  Substance Use Topics  . Smoking status: Never Smoker  . Smokeless tobacco: Never Used  . Alcohol use No   Objective:   BP (!) 80/60 (BP Location: Left Arm, Patient Position: Sitting, Cuff Size: Normal)   Pulse 60   Temp 98 F (36.7 C) (Oral)   Resp 16   Ht 5\' 3"  (1.6 m)   Wt 119 lb 6.4 oz (54.2 kg)   SpO2 98%   BMI 21.15 kg/m  Vitals:   05/11/16 0817  BP: (!) 80/60  Pulse: 60  Resp: 16  Temp: 98 F (36.7 C)  TempSrc: Oral  SpO2: 98%  Weight: 119 lb 6.4 oz (54.2 kg)  Height: 5\' 3"  (1.6 m)     Physical Exam  Constitutional: She appears well-developed and well-nourished. No distress.  Neck: Normal range of motion. Neck supple. No JVD present. No tracheal deviation present. No thyromegaly present.  Cardiovascular: Normal rate, regular rhythm and normal heart sounds.  Exam reveals no gallop and no friction rub.   No murmur heard. Pulmonary/Chest: Effort normal and breath sounds normal. No respiratory distress. She has no wheezes. She has no rales.  Musculoskeletal: She exhibits no edema.  Lymphadenopathy:    She has no cervical adenopathy.  Skin: She is not diaphoretic.  Vitals reviewed.  Diabetic Foot Exam - Simple   Simple Foot Form Diabetic Foot exam was performed with the following findings:  Yes 05/11/2016  8:44 AM  Visual  Inspection No deformities, no ulcerations, no other skin breakdown bilaterally:  Yes Sensation Testing Intact to touch and monofilament testing bilaterally:  Yes Pulse Check Posterior Tibialis and Dorsalis pulse intact bilaterally:  Yes Comments Normal foot exam       Assessment & Plan:     1. Controlled type 2 diabetes mellitus without complication, without long-term current use of insulin (HCC) A1c increased to 7.0 from 6.3. Will increase metformin to 500mg  2 tabs PO BID. Continue glipizide 5mg  daily. I will see her back in 3 months to recheck at that time. - POCT glycosylated hemoglobin (Hb A1C) - metFORMIN (GLUCOPHAGE) 500 MG tablet; Take 1 tablet (500 mg total) by mouth 2 (two) times daily with a meal.  Dispense: 180 tablet; Refill: 1 - glipiZIDE (GLUCOTROL) 5  MG tablet; Take 1 tablet (5 mg total) by mouth daily before breakfast.  Dispense: 90 tablet; Refill: 1  2. Hypercholesteremia Stable. Diagnosis pulled for medication refill. Continue current medical treatment plan. - simvastatin (ZOCOR) 20 MG tablet; Take 1 tablet (20 mg total) by mouth at bedtime.  Dispense: 90 tablet; Refill: 1  3. Hypothyroidism, unspecified type Stable. Diagnosis pulled for medication refill. Continue current medical treatment plan. - levothyroxine (SYNTHROID) 100 MCG tablet; Take 1 tablet (100 mcg total) by mouth daily.  Dispense: 90 tablet; Refill: [place], PA-C  [facility]

## 2016-07-03 ENCOUNTER — Other Ambulatory Visit: Payer: Self-pay

## 2016-07-03 DIAGNOSIS — E119 Type 2 diabetes mellitus without complications: Secondary | ICD-10-CM

## 2016-07-03 MED ORDER — METFORMIN HCL 500 MG PO TABS: ORAL_TABLET | ORAL | 0 refills | Status: DC

## 2016-07-03 NOTE — Telephone Encounter (Signed)
On last visit in march due to A1C elevation patient was told to increase Metformin 500 mg to 2 po BID. IS this ok to refill for patient with new directions?-aa Meredith Jones patient

## 2016-07-30 LAB — MICROALBUMIN, URINE: Microalb, Ur: <3

## 2016-08-13 ENCOUNTER — Ambulatory Visit (INDEPENDENT_AMBULATORY_CARE_PROVIDER_SITE_OTHER): Payer: Commercial Managed Care - PPO | Admitting: Physician Assistant

## 2016-08-13 ENCOUNTER — Encounter: Payer: Self-pay | Admitting: Physician Assistant

## 2016-08-13 VITALS — BP 94/60 | HR 64 | Temp 98.2°F | Resp 16 | Wt 111.2 lb

## 2016-08-13 DIAGNOSIS — E119 Type 2 diabetes mellitus without complications: Secondary | ICD-10-CM

## 2016-08-13 LAB — POCT GLYCOSYLATED HEMOGLOBIN (HGB A1C)
ESTIMATED AVERAGE GLUCOSE: 134
HEMOGLOBIN A1C: 6.3

## 2016-08-13 NOTE — Progress Notes (Signed)
[name] Female    DOB: 1955/07/29   60 y.o.   MRN: [id number] Visit Date: 08/13/2016  Today's Provider: Mar Daring, PA-C   Chief Complaint  Patient presents with  . Diabetes   Subjective:    HPI  Diabetes Mellitus Type II, Follow-up:   Lab Results  Component Value Date   HGBA1C 6.3 08/13/2016   HGBA1C 7.0 05/11/2016   HGBA1C 6.3 (H) 10/20/2015    Last seen for diabetes 3 months ago.  Management since then includes increase metformin 500 mg  2 tablets two times a day. She reports excellent compliance with treatment. She is not having side effects.  Current symptoms include none and have been stable. Home blood sugar records: fasting range: 104  Episodes of hypoglycemia? no   Current Insulin Regimen: none Most Recent Eye Exam: UTD Weight trend: stable Prior visit with dietician: no Current diet: in general, a "healthy" diet   Current exercise: housecleaning and no regular exercise  Pertinent Labs:    Component Value Date/Time   CHOL 157 10/20/2015 1002   TRIG 88 10/20/2015 1002   HDL 73 10/20/2015 1002   LDLCALC 66 10/20/2015 1002   CREATININE 0.65 10/20/2015 1002    Wt Readings from Last 3 Encounters:  08/13/16 111 lb 3.2 oz (50.4 kg)  05/11/16 119 lb 6.4 oz (54.2 kg)  11/03/15 112 lb (50.8 kg)  ------------------------------------------------------------------------    Allergies  Allergen Reactions  . Clarithromycin Nausea Only  . Doxycycline     nausea, dizziness.  Marland Kitchen Penicillins Hives     Current Outpatient Prescriptions:  .  aspirin 81 MG tablet, ASPIRIN, 81MG  (Oral Tablet Delayed Release)  1 a week for 0 days  Quantity: 0.00;  Refills: 0   Ordered :02-Oct-2010  Doy Hutching ;  Started 12-Mar-2007 Active, Disp: , Rfl:  .  calcipotriene (DOVONOX) 0.005 % ointment, CALCIPOTRIENE, 0.005% (External Ointment) - Historical Medication  apply to skin daily (0.005 %) Active, Disp: , Rfl:  .  clobetasol ointment (TEMOVATE)  0.05 %, CLOBETASOL PROPIONATE, 0.05% (External Ointment) - Historical Medication  apply to skin daily (0.05 %) Active, Disp: , Rfl:  .  cycloSPORINE (RESTASIS) 0.05 % ophthalmic emulsion, Place 1 drop into both eyes 2 (two) times daily. , Disp: , Rfl:  .  fexofenadine (ALLEGRA) 60 MG tablet, Take 60 mg by mouth daily. Reported on 09/01/2015, Disp: , Rfl:  .  glipiZIDE (GLUCOTROL) 5 MG tablet, Take 1 tablet (5 mg total) by mouth daily before breakfast., Disp: 90 tablet, Rfl: 1 .  levothyroxine (SYNTHROID) 100 MCG tablet, Take 1 tablet (100 mcg total) by mouth daily., Disp: 90 tablet, Rfl: 1 .  metFORMIN (GLUCOPHAGE) 500 MG tablet, Take 2 tablets twice daily, Disp: 360 tablet, Rfl: 0 .  MULTIPLE VITAMIN PO, MULTIVITAMINS (Oral Tablet)  1 Every Day for 0 days  Quantity: 0.00;  Refills: 0   Ordered :02-Oct-2010  Doy Hutching ;  Started 12-Mar-2007 Active, Disp: , Rfl:  .  ONE TOUCH ULTRA TEST test strip, USE TO TEST BLOOD SUGAR TWICE A DAY AS INSTRUCTED, Disp: 100 each, Rfl: 12 .  ONETOUCH DELICA LANCETS 63O MISC, USE TO TEST BLOOD SUGAR ONCE A DAY, Disp: 100 each, Rfl: 12 .  Polyvinyl Alcohol-Povidone (REFRESH OP), Apply to eye daily., Disp: , Rfl:  .  simvastatin (ZOCOR) 20 MG tablet, Take 1 tablet (20 mg total) by mouth at bedtime., Disp: 90 tablet, Rfl: 1 .  triamcinolone cream (KENALOG) 0.1 %, Apply  1 application topically daily. , Disp: , Rfl:   Review of Systems  Constitutional: Negative.   HENT: Negative.   Eyes: Negative.   Respiratory: Negative.   Cardiovascular: Negative.   Gastrointestinal: Negative.   Endocrine: Negative.   Genitourinary: Negative.   Musculoskeletal: Negative.   Skin: Negative.   Allergic/Immunologic: Negative.   Neurological: Negative.   Hematological: Negative.   Psychiatric/Behavioral: Negative.     Social History  Substance Use Topics  . Smoking status: Never Smoker  . Smokeless tobacco: Never Used  . Alcohol use No   Objective:   BP 94/60 (BP  Location: Left Arm, Patient Position: Sitting, Cuff Size: Normal)   Pulse 64   Temp 98.2 F (36.8 C) (Oral)   Resp 16   Wt 111 lb 3.2 oz (50.4 kg)   BMI 19.70 kg/m  Vitals:   08/13/16 0810  BP: 94/60  Pulse: 64  Resp: 16  Temp: 98.2 F (36.8 C)  TempSrc: Oral  Weight: 111 lb 3.2 oz (50.4 kg)     Physical Exam  Constitutional: She appears well-developed and well-nourished. No distress.  Neck: Normal range of motion. Neck supple. No JVD present. No tracheal deviation present. No thyromegaly present.  Cardiovascular: Normal rate and regular rhythm.  Exam reveals no gallop and no friction rub.   No murmur heard. Pulmonary/Chest: Effort normal and breath sounds normal. No respiratory distress. She has no wheezes. She has no rales.  Musculoskeletal: She exhibits no edema.  Lymphadenopathy:    She has no cervical adenopathy.  Skin: She is not diaphoretic.  Vitals reviewed.      Assessment & Plan:     1. Controlled type 2 diabetes mellitus without complication, without long-term current use of insulin (HCC) A1c improved to 6.3. Continue metformin and glipizide. Tolerating well. Continue follow ups with Dr. Gabriel Carina. I will see her back in 3 months for her CPE. - POCT glycosylated hemoglobin (Hb A1C)       Mar Daring, PA-C  [facility]

## 2016-08-13 NOTE — Patient Instructions (Signed)
Diabetes Mellitus and Food It is important for you to manage your blood sugar (glucose) level. Your blood glucose level can be greatly affected by what you eat. Eating healthier foods in the appropriate amounts throughout the day at about the same time each day will help you control your blood glucose level. It can also help slow or prevent worsening of your diabetes mellitus. Healthy eating may even help you improve the level of your blood pressure and reach or maintain a healthy weight. General recommendations for healthful eating and cooking habits include:  Eating meals and snacks regularly. Avoid going long periods of time without eating to lose weight.  Eating a diet that consists mainly of plant-based foods, such as fruits, vegetables, nuts, legumes, and whole grains.  Using low-heat cooking methods, such as baking, instead of high-heat cooking methods, such as deep frying.  Work with your dietitian to make sure you understand how to use the Nutrition Facts information on food labels. How can food affect me? Carbohydrates Carbohydrates affect your blood glucose level more than any other type of food. Your dietitian will help you determine how many carbohydrates to eat at each meal and teach you how to count carbohydrates. Counting carbohydrates is important to keep your blood glucose at a healthy level, especially if you are using insulin or taking certain medicines for diabetes mellitus. Alcohol Alcohol can cause sudden decreases in blood glucose (hypoglycemia), especially if you use insulin or take certain medicines for diabetes mellitus. Hypoglycemia can be a life-threatening condition. Symptoms of hypoglycemia (sleepiness, dizziness, and disorientation) are similar to symptoms of having too much alcohol. If your health care provider has given you approval to drink alcohol, do so in moderation and use the following guidelines:  Women should not have more than one drink per day, and men  should not have more than two drinks per day. One drink is equal to: ? 12 oz of beer. ? 5 oz of wine. ? 1 oz of hard liquor.  Do not drink on an empty stomach.  Keep yourself hydrated. Have water, diet soda, or unsweetened iced tea.  Regular soda, juice, and other mixers might contain a lot of carbohydrates and should be counted.  What foods are not recommended? As you make food choices, it is important to remember that all foods are not the same. Some foods have fewer nutrients per serving than other foods, even though they might have the same number of calories or carbohydrates. It is difficult to get your body what it needs when you eat foods with fewer nutrients. Examples of foods that you should avoid that are high in calories and carbohydrates but low in nutrients include:  Trans fats (most processed foods list trans fats on the Nutrition Facts label).  Regular soda.  Juice.  Candy.  Sweets, such as cake, pie, doughnuts, and cookies.  Fried foods.  What foods can I eat? Eat nutrient-rich foods, which will nourish your body and keep you healthy. The food you should eat also will depend on several factors, including:  The calories you need.  The medicines you take.  Your weight.  Your blood glucose level.  Your blood pressure level.  Your cholesterol level.  You should eat a variety of foods, including:  Protein. ? Lean cuts of meat. ? Proteins low in saturated fats, such as fish, egg whites, and beans. Avoid processed meats.  Fruits and vegetables. ? Fruits and vegetables that may help control blood glucose levels, such as apples,   mangoes, and yams.  Dairy products. ? Choose fat-free or low-fat dairy products, such as milk, yogurt, and cheese.  Grains, bread, pasta, and rice. ? Choose whole grain products, such as multigrain bread, whole oats, and brown rice. These foods may help control blood pressure.  Fats. ? Foods containing healthful fats, such as  nuts, avocado, olive oil, canola oil, and fish.  Does everyone with diabetes mellitus have the same meal plan? Because every person with diabetes mellitus is different, there is not one meal plan that works for everyone. It is very important that you meet with a dietitian who will help you create a meal plan that is just right for you. This information is not intended to replace advice given to you by your health care provider. Make sure you discuss any questions you have with your health care provider. Document Released: 10/26/2004 Document Revised: 07/07/2015 Document Reviewed: 12/26/2012 Elsevier Interactive Patient Education  2017 [employer]  

## 2016-09-28 ENCOUNTER — Other Ambulatory Visit: Payer: Self-pay | Admitting: Family Medicine

## 2016-09-28 DIAGNOSIS — E119 Type 2 diabetes mellitus without complications: Secondary | ICD-10-CM

## 2016-09-28 NOTE — Telephone Encounter (Signed)
[  please review-aa 

## 2016-10-08 LAB — TSH: TSH: 1.96 (ref <=5.90)

## 2016-11-12 ENCOUNTER — Encounter: Payer: Self-pay | Admitting: Physician Assistant

## 2016-11-13 ENCOUNTER — Encounter: Payer: Self-pay | Admitting: Physician Assistant

## 2016-11-13 ENCOUNTER — Ambulatory Visit (INDEPENDENT_AMBULATORY_CARE_PROVIDER_SITE_OTHER): Payer: Commercial Managed Care - PPO | Admitting: Physician Assistant

## 2016-11-13 VITALS — BP 84/60 | HR 63 | Temp 98.0°F | Resp 16 | Ht 63.0 in | Wt 114.0 lb

## 2016-11-13 DIAGNOSIS — Z114 Encounter for screening for human immunodeficiency virus [HIV]: Secondary | ICD-10-CM | POA: Diagnosis not present

## 2016-11-13 DIAGNOSIS — Z1231 Encounter for screening mammogram for malignant neoplasm of breast: Secondary | ICD-10-CM

## 2016-11-13 DIAGNOSIS — Z23 Encounter for immunization: Secondary | ICD-10-CM | POA: Diagnosis not present

## 2016-11-13 DIAGNOSIS — Z0000 Encounter for general adult medical examination without abnormal findings: Secondary | ICD-10-CM | POA: Diagnosis not present

## 2016-11-13 DIAGNOSIS — Z124 Encounter for screening for malignant neoplasm of cervix: Secondary | ICD-10-CM | POA: Diagnosis not present

## 2016-11-13 DIAGNOSIS — E119 Type 2 diabetes mellitus without complications: Secondary | ICD-10-CM | POA: Diagnosis not present

## 2016-11-13 DIAGNOSIS — Z1239 Encounter for other screening for malignant neoplasm of breast: Secondary | ICD-10-CM

## 2016-11-13 DIAGNOSIS — E7800 Pure hypercholesterolemia, unspecified: Secondary | ICD-10-CM

## 2016-11-13 DIAGNOSIS — E039 Hypothyroidism, unspecified: Secondary | ICD-10-CM | POA: Diagnosis not present

## 2016-11-13 NOTE — Patient Instructions (Signed)

## 2016-11-13 NOTE — Progress Notes (Signed)
[name], Female    DOB: [birth year], 61 y.o.   MRN: [id number] Visit Date: 11/13/2016  Today's Provider: Mar Daring, PA-C   Chief Complaint  Patient presents with  . Annual Exam  . Hyperlipidemia   Subjective:    Annual physical exam Meredith Jones is a 61 y.o. female who presents today for health maintenance and complete physical. She feels well. She reports exercising active with daily activies. She reports she is sleeping fairly well. Patient is F/B Dr. Gabriel Carina for diabetes and hypothyroidism. 10/20/15 CPE 12/16/15 Mammogram-BI-RADS 1 09/02/15 Colonoscopy-polyps, path results-Tubular Adenoma 04/04/11 Pap-neg; HPV-neg -----------------------------------------------------------------  Lipid/Cholesterol, Follow-up:   Last seen for this7 months ago.  Management changes since that visit include no changes.  Last Lipid Panel:    Component Value Date/Time   CHOL 157 10/20/2015 1002   TRIG 88 10/20/2015 1002   HDL 73 10/20/2015 1002   CHOLHDL 2.2 10/20/2015 1002   LDLCALC 66 10/20/2015 1002    Risk factors for vascular disease include diabetes mellitus  She reports excellent compliance with treatment. She is not having side effects.  Current symptoms include none and have been stable. Weight trend: stable Prior visit with dietician: no Current diet: in general, a "healthy" diet   Current exercise: housecleaning  Wt Readings from Last 3 Encounters:  11/13/16 114 lb (51.7 kg)  08/13/16 111 lb 3.2 oz (50.4 kg)  05/11/16 119 lb 6.4 oz (54.2 kg)   -------------------------------------------------------------------    Review of Systems  Constitutional: Negative.        Irritability  HENT: Negative.   Eyes: Negative.   Respiratory: Negative.   Cardiovascular: Negative.   Gastrointestinal: Negative.   Endocrine: Positive for cold intolerance.  Genitourinary: Negative.   Musculoskeletal: Negative.   Skin: Negative.     Allergic/Immunologic: Negative.   Neurological: Negative.   Hematological: Negative.   Psychiatric/Behavioral: Negative.     Social History      She  reports that she has never smoked. She has never used smokeless tobacco. She reports that she does not drink alcohol or use drugs.       Social History   Social History  . Marital status: Single    Spouse name: N/A  . Number of children: N/A  . Years of education: N/A   Social History Main Topics  . Smoking status: Never Smoker  . Smokeless tobacco: Never Used  . Alcohol use No  . Drug use: No  . Sexual activity: Not Asked   Other Topics Concern  . None   Social History Narrative  . None    Past Medical History:  Diagnosis Date  . Allergy   . Diabetes mellitus without complication ([place])   . Diverticulosis   . Hyperlipidemia   . Hypothyroidism   . Thyroid disease      Patient Active Problem List   Diagnosis Date Noted  . Loss of weight   . Benign neoplasm of descending colon   . Type 2 diabetes mellitus without complication, without long-term current use of insulin ([place]) 06/07/2015  . Body mass index 28.0-28.9, adult 06/09/2014  . 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 Cyst of ovary 06/09/2014  . Bulky or enlarged uterus 06/09/2014  . Hypercholesteremia 02/09/2007  . Allergic rhinitis 11/27/2006  . Endometriosis 11/27/2006  . Adult hypothyroidism 11/27/2006    Past Surgical History:  Procedure Laterality Date  . BACK SURGERY  2007   Lumbar Surgery  . CATARACT EXTRACTION Left  2010   Right Eye 2011  . COLONOSCOPY WITH PROPOFOL N/A 09/02/2015   Procedure: COLONOSCOPY WITH PROPOFOL;  Surgeon: [name], MD;  Location: [facility];  Service: Endoscopy;  Laterality: N/A;  Diabetic - oral meds  . ESOPHAGOGASTRODUODENOSCOPY (EGD) WITH PROPOFOL N/A 11/03/2015   Procedure: ESOPHAGOGASTRODUODENOSCOPY (EGD) WITH PROPOFOL;  Surgeon: [name], MD;  Location: [facility];  Service: Endoscopy;   Laterality: N/A;  . EYE SURGERY Bilateral 06/2014    tear duck catherization ( Dr. Vallarie Mare)  . POLYPECTOMY  09/02/2015   Procedure: POLYPECTOMY;  Surgeon: [name], MD;  Location: [facility];  Service: Endoscopy;;  . TONSILLECTOMY  1963    Family History        Family Status  Relation Status  . Mother Deceased at age 3  . Father Deceased at age 56  . PGM Deceased        Her family history includes Cancer in her father and paternal grandmother; Congestive Heart Failure in her mother; Diabetes in her mother; Hypothyroidism in her father; Stroke in her mother.     Allergies  Allergen Reactions  . Clarithromycin Nausea Only  . Doxycycline     nausea, dizziness.  Marland Kitchen Penicillins Hives     Current Outpatient Prescriptions:  .  aspirin 81 MG tablet, ASPIRIN, 81MG  (Oral Tablet Delayed Release)  1 a week for 0 days  Quantity: 0.00;  Refills: 0   Ordered :02-Oct-2010  Doy Hutching ;  Started 12-Mar-2007 Active, Disp: , Rfl:  .  calcipotriene (DOVONOX) 0.005 % ointment, CALCIPOTRIENE, 0.005% (External Ointment) - Historical Medication  apply to skin daily (0.005 %) Active, Disp: , Rfl:  .  clobetasol ointment (TEMOVATE) 0.05 %, CLOBETASOL PROPIONATE, 0.05% (External Ointment) - Historical Medication  apply to skin daily (0.05 %) Active, Disp: , Rfl:  .  cycloSPORINE (RESTASIS) 0.05 % ophthalmic emulsion, Place 1 drop into both eyes 2 (two) times daily. , Disp: , Rfl:  .  dexamethasone (DECADRON) 0.5 MG/5ML solution, Take 0.5 mg by mouth daily. Use daily one week then off one week, Disp: , Rfl:  .  glipiZIDE (GLUCOTROL) 5 MG tablet, Take 1 tablet (5 mg total) by mouth daily before breakfast., Disp: 90 tablet, Rfl: 1 .  levothyroxine (SYNTHROID) 100 MCG tablet, Take 1 tablet (100 mcg total) by mouth daily., Disp: 90 tablet, Rfl: 1 .  metFORMIN (GLUCOPHAGE) 500 MG tablet, TAKE 2 TABLETS BY MOUTH TWICE DAILY, Disp: 360 tablet, Rfl: 1 .  MULTIPLE VITAMIN PO, MULTIVITAMINS (Oral  Tablet)  1 Every Day for 0 days  Quantity: 0.00;  Refills: 0   Ordered :02-Oct-2010  Doy Hutching ;  Started 12-Mar-2007 Active, Disp: , Rfl:  .  ONE TOUCH ULTRA TEST test strip, USE TO TEST BLOOD SUGAR TWICE A DAY AS INSTRUCTED, Disp: 100 each, Rfl: 12 .  ONETOUCH DELICA LANCETS 16S MISC, USE TO TEST BLOOD SUGAR ONCE A DAY, Disp: 100 each, Rfl: 12 .  Polyvinyl Alcohol-Povidone (REFRESH OP), Apply to eye daily., Disp: , Rfl:  .  simvastatin (ZOCOR) 20 MG tablet, Take 1 tablet (20 mg total) by mouth at bedtime., Disp: 90 tablet, Rfl: 1 .  triamcinolone cream (KENALOG) 0.1 %, Apply 1 application topically daily. , Disp: , Rfl:  .  fexofenadine (ALLEGRA) 60 MG tablet, Take 60 mg by mouth daily. Reported on 09/01/2015, Disp: , Rfl:    Patient Care Team: Rubye Beach as PCP - General (Family Medicine)      Objective:  Vitals: BP (!) 84/60 (BP Location: Left Arm, Patient Position: Sitting, Cuff Size: Large)   Pulse 63   Temp 98 F (36.7 C) (Oral)   Resp 16   Ht 5\' 3"  (1.6 m)   Wt 114 lb (51.7 kg)   SpO2 98%   BMI 20.19 kg/m    Vitals:   11/13/16 0903  BP: (!) 84/60  Pulse: 63  Resp: 16  Temp: 98 F (36.7 C)  TempSrc: Oral  SpO2: 98%  Weight: 114 lb (51.7 kg)  Height: 5\' 3"  (1.6 m)     Physical Exam  Constitutional: She is oriented to person, place, and time. She appears well-developed and well-nourished. No distress.  HENT:  Head: Normocephalic and atraumatic.  Right Ear: Hearing, tympanic membrane, external ear and ear canal normal.  Left Ear: Hearing, tympanic membrane, external ear and ear canal normal.  Nose: Nose normal.  Mouth/Throat: Uvula is midline, oropharynx is clear and moist and mucous membranes are normal. No oropharyngeal exudate.  Eyes: Pupils are equal, round, and reactive to light. Conjunctivae and EOM are normal. Right eye exhibits no discharge. Left eye exhibits no discharge. No scleral icterus.  Neck: Normal range of motion. Neck supple.  No JVD present. Carotid bruit is not present. No tracheal deviation present. No thyromegaly present.  Cardiovascular: Normal rate, regular rhythm, normal heart sounds and intact distal pulses.  Exam reveals no gallop and no friction rub.   No murmur heard. Pulmonary/Chest: Effort normal and breath sounds normal. No respiratory distress. She has no wheezes. She has no rales. She exhibits no tenderness. Right breast exhibits no inverted nipple, no mass, no nipple discharge, no skin change and no tenderness. Left breast exhibits no inverted nipple, no mass, no nipple discharge, no skin change and no tenderness. Breasts are symmetrical.  Abdominal: Soft. Bowel sounds are normal. She exhibits no distension and no mass. There is no tenderness. There is no rebound and no guarding. Hernia confirmed negative in the right inguinal area and confirmed negative in the left inguinal area.  Genitourinary: Rectum normal, vagina normal and uterus normal. No breast swelling, tenderness, discharge or bleeding. Pelvic exam was performed with patient supine. There is no rash, tenderness, lesion or injury on the right labia. There is no rash, tenderness, lesion or injury on the left labia. Cervix exhibits no motion tenderness, no discharge and no friability. Right adnexum displays no mass, no tenderness and no fullness. Left adnexum displays no mass, no tenderness and no fullness. No erythema, tenderness or bleeding in the vagina. No signs of injury around the vagina. No vaginal discharge found.  Musculoskeletal: Normal range of motion. She exhibits no edema or tenderness.  Lymphadenopathy:    She has no cervical adenopathy.       Right: No inguinal adenopathy present.       Left: No inguinal adenopathy present.  Neurological: She is alert and oriented to person, place, and time. She has normal reflexes. No cranial nerve deficit. Coordination normal.  Skin: Skin is warm and dry. No rash noted. She is not diaphoretic.    Psychiatric: She has a normal mood and affect. Her behavior is normal. Judgment and thought content normal.  Vitals reviewed.    Depression Screen PHQ 2/9 Scores 11/13/2016 08/13/2016 07/27/2014  PHQ - 2 Score 0 0 0  PHQ- 9 Score 1 1 -      Assessment & Plan:     Routine Health Maintenance and Physical Exam  Exercise Activities and Dietary recommendations Goals    .  Exercise 150 minutes per week (moderate activity)    . Peak Blood Glucose < 180       Immunization History  Administered Date(s) Administered  . Tdap 01/16/2007    Health Maintenance  Topic Date Due  . PNEUMOCOCCAL POLYSACCHARIDE VACCINE (1) 03/01/1957  . HIV Screening  03/01/1970  . PAP SMEAR  04/03/2016  . INFLUENZA VACCINE  09/12/2016  . OPHTHALMOLOGY EXAM  12/20/2016  . TETANUS/TDAP  01/15/2017  . HEMOGLOBIN A1C  02/13/2017  . FOOT EXAM  05/11/2017  . URINE MICROALBUMIN  07/30/2017  . MAMMOGRAM  12/15/2017  . COLONOSCOPY  09/01/2020  . Hepatitis C Screening  Completed     Discussed health benefits of physical activity, and encouraged her to engage in regular exercise appropriate for her age and condition.    1. Annual physical exam Normal physical exam today. Will check labs as below and f/u pending lab results. If labs are stable and WNL she will not need to have these rechecked for one year at her next annual physical exam. She is to call the office in the meantime if she has any acute issue, questions or concerns. - CBC with Differential/Platelet - Comprehensive metabolic panel  2. Cervical cancer screening Pap collected today. Will send as below and f/u pending results. - Pap IG and HPV (high risk) DNA detection  3. Breast cancer screening Breast exam today was normal. There is no family history of breast cancer. She does perform regular self breast exams. Mammogram was ordered as below. Information for [facility] was given to patient so she may schedule her mammogram at her  convenience. - MM DIGITAL SCREENING BILATERAL; Future  4. Hypercholesteremia Stable. Continue simvastatin 20mg  daily. Will check labs as below and f/u pending results. - Lipid panel  5. Type 2 diabetes mellitus without complication, without long-term current use of insulin (HCC) Followed by Dr. Gabriel Carina. Has appt in Dec 2018 for f/u with lab checks.   6. Adult hypothyroidism Followed by Dr. Gabriel Carina. Has appt in Dec for f/u with lab checks.   7. Need for pneumococcal vaccination Patient is going to postpone vaccine for now as she is scheduled to have her flu vaccine through her employer on 12/06/16 and she wanted to be able to get that vaccine at that time.  8. Need for influenza vaccination Scheduled through her employer on 12/06/16 per patient (works at [employer]).   9. Screening for HIV (human immunodeficiency virus) - HIV antibody  --------------------------------------------------------------------    Mar Daring, PA-C  [facility]

## 2016-11-14 LAB — CBC WITH DIFFERENTIAL/PLATELET
Basophils Absolute: 72 cells/uL (ref 0–200)
Basophils Relative: 1.5 %
EOS PCT: 13.9 %
Eosinophils Absolute: 667 cells/uL — ABNORMAL HIGH (ref 15–500)
HCT: 33.8 % — ABNORMAL LOW (ref 35.0–45.0)
Hemoglobin: 11.6 g/dL — ABNORMAL LOW (ref 11.7–15.5)
Lymphs Abs: 1138 cells/uL (ref 850–3900)
MCH: 33.4 pg — ABNORMAL HIGH (ref 27.0–33.0)
MCHC: 34.3 g/dL (ref 32.0–36.0)
MCV: 97.4 fL (ref 80.0–100.0)
MPV: 8.4 fL (ref 7.5–12.5)
Monocytes Relative: 10.5 %
NEUTROS PCT: 50.4 %
Neutro Abs: 2419 cells/uL (ref 1500–7800)
PLATELETS: 195 10*3/uL (ref 140–400)
RBC: 3.47 10*6/uL — AB (ref 3.80–5.10)
RDW: 13.1 % (ref 11.0–15.0)
TOTAL LYMPHOCYTE: 23.7 %
WBC mixed population: 504 cells/uL (ref 200–950)
WBC: 4.8 10*3/uL (ref 3.8–10.8)

## 2016-11-14 LAB — LIPID PANEL
CHOL/HDL RATIO: 2 (calc) (ref <5.0)
CHOLESTEROL: 145 mg/dL (ref <200)
HDL: 71 mg/dL (ref >50)
LDL CHOLESTEROL (CALC): 56 mg/dL
Non-HDL Cholesterol (Calc): 74 mg/dL (calc) (ref <130)
TRIGLYCERIDES: 101 mg/dL (ref <150)

## 2016-11-14 LAB — HIV ANTIBODY (ROUTINE TESTING W REFLEX): HIV: NONREACTIVE

## 2016-11-14 LAB — COMPREHENSIVE METABOLIC PANEL
AG Ratio: 1.4 (calc) (ref 1.0–2.5)
ALKALINE PHOSPHATASE (APISO): 48 U/L (ref 33–130)
ALT: 18 U/L (ref 6–29)
AST: 23 U/L (ref 10–35)
Albumin: 3.7 g/dL (ref 3.6–5.1)
BUN: 7 mg/dL (ref 7–25)
CO2: 31 mmol/L (ref 20–32)
CREATININE: 0.57 mg/dL (ref 0.50–0.99)
Calcium: 9.2 mg/dL (ref 8.6–10.4)
Chloride: 97 mmol/L — ABNORMAL LOW (ref 98–110)
Globulin: 2.6 g/dL (calc) (ref 1.9–3.7)
Glucose, Bld: 103 mg/dL — ABNORMAL HIGH (ref 65–99)
Potassium: 3.9 mmol/L (ref 3.5–5.3)
Sodium: 135 mmol/L (ref 135–146)
TOTAL PROTEIN: 6.3 g/dL (ref 6.1–8.1)
Total Bilirubin: 0.4 mg/dL (ref 0.2–1.2)

## 2016-11-14 LAB — PAP IG AND HPV HIGH-RISK: HPV DNA High Risk: NOT DETECTED

## 2016-11-15 ENCOUNTER — Telehealth: Payer: Self-pay | Admitting: Physician Assistant

## 2016-11-15 ENCOUNTER — Other Ambulatory Visit: Payer: Self-pay | Admitting: Physician Assistant

## 2016-11-15 DIAGNOSIS — E119 Type 2 diabetes mellitus without complications: Secondary | ICD-10-CM

## 2016-11-15 NOTE — Telephone Encounter (Signed)
Pt is returning call.  KL#[phone]/PV

## 2016-11-16 NOTE — Telephone Encounter (Signed)
-----   Message from Mar Daring, [place] sent at 11/15/2016  1:24 PM EDT ----- Pap was unsatisfactory with insufficient amount of squamous cells present. If patient desires she can schedule pap only for repeat at no charge at her convenience.

## 2016-11-16 NOTE — Telephone Encounter (Signed)
No answer/unable to LM  Thanks,  -Samaria Anes 

## 2016-11-19 NOTE — Telephone Encounter (Signed)
-----   Message from Mar Daring, [place] sent at 11/15/2016  1:24 PM EDT ----- Pap was unsatisfactory with insufficient amount of squamous cells present. If patient desires she can schedule pap only for repeat at no charge at her convenience.

## 2016-11-19 NOTE — Telephone Encounter (Signed)
lmtcb

## 2016-11-20 NOTE — Telephone Encounter (Signed)
Patient advised as below. Patient reports she will check her calender and will call us back to schedule appointment. sd

## 2016-11-30 ENCOUNTER — Encounter: Payer: Self-pay | Admitting: Physician Assistant

## 2016-11-30 ENCOUNTER — Ambulatory Visit: Payer: Commercial Managed Care - PPO | Admitting: Physician Assistant

## 2016-11-30 VITALS — BP 90/64 | HR 68 | Temp 98.3°F | Resp 16 | Wt 117.8 lb

## 2016-11-30 DIAGNOSIS — Z124 Encounter for screening for malignant neoplasm of cervix: Secondary | ICD-10-CM

## 2016-11-30 NOTE — Progress Notes (Signed)
[name] Female    DOB: [birth year]   61 y.o.   MRN: [id number] Visit Date: 11/30/2016  Today's Provider: Mar Daring, PA-C   No chief complaint on file.  Subjective:    HPI Patient is here today to repeat pap. Pap was done at her CPE but was unsatisfactory. Patient has no complaints today.     Allergies  Allergen Reactions  . Clarithromycin Nausea Only  . Doxycycline     nausea, dizziness.  Marland Kitchen Penicillins Hives     Current Outpatient Prescriptions:  .  aspirin 81 MG tablet, ASPIRIN, 81MG  (Oral Tablet Delayed Release)  1 a week for 0 days  Quantity: 0.00;  Refills: 0   Ordered :02-Oct-2010  Doy Hutching ;  Started 12-Mar-2007 Active, Disp: , Rfl:  .  calcipotriene (DOVONOX) 0.005 % ointment, CALCIPOTRIENE, 0.005% (External Ointment) - Historical Medication  apply to skin daily (0.005 %) Active, Disp: , Rfl:  .  clobetasol ointment (TEMOVATE) 0.05 %, CLOBETASOL PROPIONATE, 0.05% (External Ointment) - Historical Medication  apply to skin daily (0.05 %) Active, Disp: , Rfl:  .  cycloSPORINE (RESTASIS) 0.05 % ophthalmic emulsion, Place 1 drop into both eyes 2 (two) times daily. , Disp: , Rfl:  .  dexamethasone (DECADRON) 0.5 MG/5ML solution, Take 0.5 mg by mouth daily. Use daily one week then off one week, Disp: , Rfl:  .  glipiZIDE (GLUCOTROL) 5 MG tablet, TAKE 1 TABLET BY MOUTH DAILY BEFORE BREAKFAST, Disp: 90 tablet, Rfl: 1 .  levothyroxine (SYNTHROID) 100 MCG tablet, Take 1 tablet (100 mcg total) by mouth daily., Disp: 90 tablet, Rfl: 1 .  metFORMIN (GLUCOPHAGE) 500 MG tablet, TAKE 2 TABLETS BY MOUTH TWICE DAILY, Disp: 360 tablet, Rfl: 1 .  MULTIPLE VITAMIN PO, MULTIVITAMINS (Oral Tablet)  1 Every Day for 0 days  Quantity: 0.00;  Refills: 0   Ordered :02-Oct-2010  Doy Hutching ;  Started 12-Mar-2007 Active, Disp: , Rfl:  .  ONE TOUCH ULTRA TEST test strip, USE TO TEST BLOOD SUGAR TWICE A DAY AS INSTRUCTED, Disp: 100 each, Rfl: 12 .  ONETOUCH  DELICA LANCETS 56L MISC, USE TO TEST BLOOD SUGAR ONCE A DAY, Disp: 100 each, Rfl: 12 .  Polyvinyl Alcohol-Povidone (REFRESH OP), Apply to eye daily., Disp: , Rfl:  .  simvastatin (ZOCOR) 20 MG tablet, Take 1 tablet (20 mg total) by mouth at bedtime., Disp: 90 tablet, Rfl: 1 .  triamcinolone cream (KENALOG) 0.1 %, Apply 1 application topically daily. , Disp: , Rfl:  .  fexofenadine (ALLEGRA) 60 MG tablet, Take 60 mg by mouth daily. Reported on 09/01/2015, Disp: , Rfl:   Review of Systems  Constitutional: Negative.   Respiratory: Negative.   Cardiovascular: Negative.   Gastrointestinal: Negative.   Genitourinary: Negative.     Social History  Substance Use Topics  . Smoking status: Never Smoker  . Smokeless tobacco: Never Used  . Alcohol use No   Objective:   BP 90/64 (BP Location: Left Arm, Patient Position: Sitting, Cuff Size: Normal)   Pulse 68   Temp 98.3 F (36.8 C) (Oral)   Resp 16   Wt 117 lb 12.8 oz (53.4 kg)   BMI 20.87 kg/m    Physical Exam  Genitourinary: Vagina normal and uterus normal. Pelvic exam was performed with patient supine. No labial fusion. There is no rash, tenderness, lesion or injury on the right labia. There is no rash, tenderness, lesion or injury on the left labia.  Cervix exhibits no motion tenderness, no discharge and no friability. No erythema or tenderness in the vagina. No vaginal discharge found.  Genitourinary Comments: Cervix is tilted slightly to the left  Vitals reviewed.       Assessment & Plan:     1. Encounter for Papanicolaou smear of cervix Pap collected today. Will send as below and f/u pending results. - Pap IG and HPV (high risk) DNA detection       Mar Daring, PA-C  [facility]

## 2016-12-04 LAB — PAP IG AND HPV HIGH-RISK: HPV DNA High Risk: NOT DETECTED

## 2016-12-05 ENCOUNTER — Telehealth: Payer: Self-pay

## 2016-12-05 NOTE — Telephone Encounter (Signed)
lmtcb

## 2016-12-05 NOTE — Telephone Encounter (Signed)
Patient advised as below.  

## 2016-12-05 NOTE — Telephone Encounter (Signed)
-----   Message from Mar Daring, PA-C sent at 12/05/2016 10:58 AM EDT ----- Pap is normal, HPV negative.  Will repeat in 3-5 years.

## 2016-12-17 LAB — HM DIABETES EYE EXAM

## 2016-12-24 ENCOUNTER — Encounter: Payer: Self-pay | Admitting: Physician Assistant

## 2017-01-11 ENCOUNTER — Telehealth: Payer: Self-pay

## 2017-01-11 ENCOUNTER — Ambulatory Visit
Admission: RE | Admit: 2017-01-11 | Discharge: 2017-01-11 | Disposition: A | Discharge status: Home / Self Care | Payer: Commercial Managed Care - PPO | Source: Ambulatory Visit | Attending: Physician Assistant | Admitting: Physician Assistant

## 2017-01-11 DIAGNOSIS — Z1231 Encounter for screening mammogram for malignant neoplasm of breast: Secondary | ICD-10-CM | POA: Insufficient documentation

## 2017-01-11 DIAGNOSIS — Z1239 Encounter for other screening for malignant neoplasm of breast: Secondary | ICD-10-CM

## 2017-01-11 NOTE — Telephone Encounter (Signed)
-----   Message from Mar Daring, [place] sent at 01/11/2017 12:02 PM EST ----- Normal mammogram. Repeat screening in one year.

## 2017-01-11 NOTE — Telephone Encounter (Signed)
LMTCB  Thanks,  -Kyran Connaughton 

## 2017-01-14 NOTE — Telephone Encounter (Signed)
Patient advised as below.  

## 2017-04-29 ENCOUNTER — Other Ambulatory Visit: Payer: Self-pay | Admitting: Physician Assistant

## 2017-04-29 DIAGNOSIS — E119 Type 2 diabetes mellitus without complications: Secondary | ICD-10-CM

## 2017-04-29 NOTE — Telephone Encounter (Signed)
Patient requesting refill for Metformin.

## 2017-05-09 ENCOUNTER — Other Ambulatory Visit: Payer: Self-pay

## 2017-05-09 DIAGNOSIS — E119 Type 2 diabetes mellitus without complications: Secondary | ICD-10-CM

## 2017-05-09 DIAGNOSIS — E7800 Pure hypercholesterolemia, unspecified: Secondary | ICD-10-CM

## 2017-05-09 MED ORDER — SIMVASTATIN 20 MG PO TABS: 20.0000 mg | ORAL_TABLET | Freq: Every day | ORAL | 1 refills | Status: DC

## 2017-05-09 MED ORDER — GLIPIZIDE 5 MG PO TABS: ORAL_TABLET | ORAL | 1 refills | Status: DC

## 2017-05-09 NOTE — Telephone Encounter (Signed)
Patient requesting refills

## 2017-05-31 ENCOUNTER — Encounter: Payer: Self-pay | Admitting: Physician Assistant

## 2017-05-31 ENCOUNTER — Ambulatory Visit (INDEPENDENT_AMBULATORY_CARE_PROVIDER_SITE_OTHER): Payer: Commercial Managed Care - PPO | Admitting: Physician Assistant

## 2017-05-31 VITALS — BP 104/62 | HR 68 | Temp 98.3°F | Resp 16 | Ht 63.0 in | Wt 117.0 lb

## 2017-05-31 DIAGNOSIS — E119 Type 2 diabetes mellitus without complications: Secondary | ICD-10-CM

## 2017-05-31 DIAGNOSIS — E7800 Pure hypercholesterolemia, unspecified: Secondary | ICD-10-CM | POA: Diagnosis not present

## 2017-05-31 MED ORDER — GLUCOSE BLOOD VI STRP: ORAL_STRIP | 12 refills | Status: DC

## 2017-05-31 NOTE — Progress Notes (Signed)
[name] Female    DOB: [birth year]   63 y.o.   MRN: [id number] Visit Date: 05/31/2017  Today's Provider: Mar Daring, PA-C   Chief Complaint  Patient presents with  . Hyperlipidemia   Subjective:    HPI  Lipid/Cholesterol, Follow-up:   Last seen for this 6 months ago.  Management changes since that visit include no changes. . Last Lipid Panel:    Component Value Date/Time   CHOL 145 11/13/2016 0933   CHOL 157 10/20/2015 1002   TRIG 101 11/13/2016 0933   HDL 71 11/13/2016 0933   HDL 73 10/20/2015 1002   CHOLHDL 2.0 11/13/2016 0933   LDLCALC 56 11/13/2016 0933    Risk factors for vascular disease include diabetes mellitus  She reports good compliance with treatment. She is not having side effects.  Current symptoms include none and have been stable. Weight trend: stable Prior visit with dietician: no Current diet: well balanced Current exercise: no regular exercise  Wt Readings from Last 3 Encounters:  05/31/17 117 lb (53.1 kg)  11/30/16 117 lb 12.8 oz (53.4 kg)  11/13/16 114 lb (51.7 kg)        Allergies  Allergen Reactions  . Clarithromycin Nausea Only  . Doxycycline     nausea, dizziness.  Marland Kitchen Penicillins Hives     Current Outpatient Medications:  .  aspirin 81 MG tablet, ASPIRIN, 81MG  (Oral Tablet Delayed Release)  1 a week for 0 days  Quantity: 0.00;  Refills: 0   Ordered :02-Oct-2010  Doy Hutching ;  Started 12-Mar-2007 Active, Disp: , Rfl:  .  calcipotriene (DOVONOX) 0.005 % ointment, CALCIPOTRIENE, 0.005% (External Ointment) - Historical Medication  apply to skin daily (0.005 %) Active, Disp: , Rfl:  .  clobetasol ointment (TEMOVATE) 0.05 %, CLOBETASOL PROPIONATE, 0.05% (External Ointment) - Historical Medication  apply to skin daily (0.05 %) Active, Disp: , Rfl:  .  cycloSPORINE (RESTASIS) 0.05 % ophthalmic emulsion, Place 1 drop into both eyes 2 (two) times daily. , Disp: , Rfl:  .  dexamethasone (DECADRON) 0.5  MG/5ML solution, Take 0.5 mg by mouth daily. Use daily one week then off one week, Disp: , Rfl:  .  fexofenadine (ALLEGRA) 60 MG tablet, Take 60 mg by mouth daily. Reported on 09/01/2015, Disp: , Rfl:  .  glipiZIDE (GLUCOTROL) 5 MG tablet, TAKE 1 TABLET BY MOUTH DAILY BEFORE BREAKFAST, Disp: 90 tablet, Rfl: 1 .  levothyroxine (SYNTHROID) 100 MCG tablet, Take 1 tablet (100 mcg total) by mouth daily., Disp: 90 tablet, Rfl: 1 .  metFORMIN (GLUCOPHAGE) 500 MG tablet, TAKE 2 TABLETS BY MOUTH TWICE A DAY., Disp: 360 tablet, Rfl: 1 .  MULTIPLE VITAMIN PO, MULTIVITAMINS (Oral Tablet)  1 Every Day for 0 days  Quantity: 0.00;  Refills: 0   Ordered :02-Oct-2010  Doy Hutching ;  Started 12-Mar-2007 Active, Disp: , Rfl:  .  ONE TOUCH ULTRA TEST test strip, USE TO TEST BLOOD SUGAR TWICE A DAY AS INSTRUCTED, Disp: 100 each, Rfl: 12 .  ONETOUCH DELICA LANCETS 01U MISC, USE TO TEST BLOOD SUGAR ONCE A DAY, Disp: 100 each, Rfl: 12 .  Polyvinyl Alcohol-Povidone (REFRESH OP), Apply to eye daily., Disp: , Rfl:  .  simvastatin (ZOCOR) 20 MG tablet, Take 1 tablet (20 mg total) by mouth at bedtime., Disp: 90 tablet, Rfl: 1 .  triamcinolone cream (KENALOG) 0.1 %, Apply 1 application topically daily. , Disp: , Rfl:   Review of Systems  Constitutional: Negative.   Respiratory: Negative.   Cardiovascular: Negative.   Musculoskeletal: Negative.   Psychiatric/Behavioral: Negative.     Social History   Tobacco Use  . Smoking status: Never Smoker  . Smokeless tobacco: Never Used  Substance Use Topics  . Alcohol use: No   Objective:   BP 104/62 (BP Location: Left Arm, Patient Position: Sitting, Cuff Size: Normal)   Pulse 68   Temp 98.3 F (36.8 C)   Resp 16   Ht 5\' 3"  (1.6 m)   Wt 117 lb (53.1 kg)   SpO2 99%   BMI 20.73 kg/m  Vitals:   05/31/17 0808  BP: 104/62  Pulse: 68  Resp: 16  Temp: 98.3 F (36.8 C)  SpO2: 99%  Weight: 117 lb (53.1 kg)  Height: 5\' 3"  (1.6 m)     Physical Exam    Constitutional: She appears well-developed and well-nourished. No distress.  Neck: Normal range of motion. Neck supple.  Cardiovascular: Normal rate, regular rhythm and normal heart sounds. Exam reveals no gallop and no friction rub.  No murmur heard. Pulmonary/Chest: Effort normal and breath sounds normal. No respiratory distress. She has no wheezes. She has no rales.  Musculoskeletal: She exhibits no edema.  Skin: She is not diaphoretic.  Vitals reviewed.      Assessment & Plan:     1. Hypercholesteremia Stable. Continue Simvastatin 20mg . Will check labs as below and f/u pending results. - CBC w/Diff/Platelet - Comprehensive Metabolic Panel (CMET) - Lipid Profile  2. Type 2 diabetes mellitus without complication, without long-term current use of insulin (HCC) Stable. Most recent A1c was 6.6 in 01/2017. Followed by Dr. Gabriel Carina and scheduled for f/u in June with her.  - CBC w/Diff/Platelet - Comprehensive Metabolic Panel (CMET) - Lipid Profile - glucose blood (ONE TOUCH ULTRA TEST) test strip; USE TO TEST BLOOD SUGAR TWICE A DAY AS INSTRUCTED  Dispense: 100 each; Refill: [employer], PA-C  [facility]

## 2017-05-31 NOTE — Patient Instructions (Signed)
 Dyslipidemia Dyslipidemia is an imbalance of waxy, fat-like substances (lipids) in the blood. The body needs lipids in small amounts. Dyslipidemia often involves a high level of cholesterol or triglycerides, which are types of lipids. Common forms of dyslipidemia include:  High levels of bad cholesterol (LDL cholesterol). LDL is the type of cholesterol that causes fatty deposits (plaques) to build up in the blood vessels that carry blood away from your heart (arteries).  Low levels of good cholesterol (HDL cholesterol). HDL cholesterol is the type of cholesterol that protects against heart disease. High levels of HDL remove the LDL buildup from arteries.  High levels of triglycerides. Triglycerides are a fatty substance in the blood that is linked to a buildup of plaques in the arteries.  You can develop dyslipidemia because of the genes you are born with (primary dyslipidemia) or changes that occur during your life (secondary dyslipidemia), or as a side effect of certain medical treatments. What are the causes? Primary dyslipidemia is caused by changes (mutations) in genes that are passed down through families (inherited). These mutations cause several types of dyslipidemia. Mutations can result in disorders that make the body produce too much LDL cholesterol or triglycerides, or not enough HDL cholesterol. These disorders may lead to heart disease, arterial disease, or stroke at an early age. Causes of secondary dyslipidemia include certain lifestyle choices and diseases that lead to dyslipidemia, such as:  Eating a diet that is high in animal fat.  Not getting enough activity or exercise (having a sedentary lifestyle).  Having diabetes, kidney disease, liver disease, or thyroid disease.  Drinking large amounts of alcohol.  Using certain types of drugs.  What increases the risk? You may be at greater risk for dyslipidemia if you are an older man or if you are a woman who has gone  through menopause. Other risk factors include:  Having a family history of dyslipidemia.  Taking certain medicines, including birth control pills, steroids, some diuretics, beta-blockers, and some medicines forHIV.  Smoking cigarettes.  Eating a high-fat diet.  Drinking large amounts of alcohol.  Having certain medical conditions such as diabetes, polycystic ovary syndrome (PCOS), pregnancy, kidney disease, liver disease, or hypothyroidism.  Not exercising regularly.  Being overweight or obese with too much belly fat.  What are the signs or symptoms? Dyslipidemia does not usually cause any symptoms. Very high lipid levels can cause fatty bumps under the skin (xanthomas) or a white or gray ring around the black center (pupil) of the eye. Very high triglyceride levels can cause inflammation of the pancreas (pancreatitis). How is this diagnosed? Your health care provider may diagnose dyslipidemia based on a routine blood test (fasting blood test). Because most people do not have symptoms of the condition, this blood testing (lipid profile) is done on adults age 20 and older and is repeated every 5 years. This test checks:  Total cholesterol. This is a measure of the total amount of cholesterol in your blood, including LDL cholesterol, HDL cholesterol, and triglycerides. A healthy number is below 200.  LDL cholesterol. The target number for LDL cholesterol is different for each person, depending on individual risk factors. For most people, a number below 100 is healthy. Ask your health care provider what your LDL cholesterol number should be.  HDL cholesterol. An HDL level of 60 or higher is best because it helps to protect against heart disease. A number below 40 for men or below 50 for women increases the risk for heart disease.  Triglycerides.   A healthy triglyceride number is below 150.  If your lipid profile is abnormal, your health care provider may do other blood tests to get more  information about your condition. How is this treated? Treatment depends on the type of dyslipidemia that you have and your other risk factors for heart disease and stroke. Your health care provider will have a target range for your lipid levels based on this information. For many people, treatment starts with lifestyle changes, such as diet and exercise. Your health care provider may recommend that you:  Get regular exercise.  Make changes to your diet.  Quit smoking if you smoke.  If diet changes and exercise do not help you reach your goals, your health care provider may also prescribe medicine to lower lipids. The most commonly prescribed type of medicine lowers your LDL cholesterol (statin drug). If you have a high triglyceride level, your provider may prescribe another type of drug (fibrate) or an omega-3 fish oil supplement, or both. Follow these instructions at home:  Take over-the-counter and prescription medicines only as told by your health care provider. This includes supplements.  Get regular exercise. Start an aerobic exercise and strength training program as told by your health care provider. Ask your health care provider what activities are safe for you. Your health care provider may recommend: ? 30 minutes of aerobic activity 4-6 days a week. Brisk walking is an example of aerobic activity. ? Strength training 2 days a week.  Eat a healthy diet as told by your health care provider. This can help you reach and maintain a healthy weight, lower your LDL cholesterol, and raise your HDL cholesterol. It may help to work with a diet and nutrition specialist (dietitian) to make a plan that is right for you. Your dietitian or health care provider may recommend: ? Limiting your calories, if you are overweight. ? Eating more fruits, vegetables, whole grains, fish, and lean meats. ? Limiting saturated fat, trans fat, and cholesterol.  Follow instructions from your health care provider  or dietitian about eating or drinking restrictions.  Limit alcohol intake to no more than one drink per day for nonpregnant women and two drinks per day for men. One drink equals 12 oz of beer, 5 oz of wine, or 1 oz of hard liquor.  Do not use any products that contain nicotine or tobacco, such as cigarettes and e-cigarettes. If you need help quitting, ask your health care provider.  Keep all follow-up visits as told by your health care provider. This is important. Contact a health care provider if:  You are having trouble sticking to your exercise or diet plan.  You are struggling to quit smoking or control your use of alcohol. Summary  Dyslipidemia is an imbalance of waxy, fat-like substances (lipids) in the blood. The body needs lipids in small amounts. Dyslipidemia often involves a high level of cholesterol or triglycerides, which are types of lipids.  Treatment depends on the type of dyslipidemia that you have and your other risk factors for heart disease and stroke.  For many people, treatment starts with lifestyle changes, such as diet and exercise. Your health care provider may also prescribe medicine to lower lipids. This information is not intended to replace advice given to you by your health care provider. Make sure you discuss any questions you have with your health care provider. Document Released: 02/03/2013 Document Revised: 09/26/2015 Document Reviewed: 09/26/2015 Elsevier Interactive Patient Education  2018 [employer]  

## 2017-06-01 LAB — CBC WITH DIFFERENTIAL/PLATELET
Basophils Absolute: 0 10*3/uL (ref 0.0–0.2)
Basos: 1 %
EOS (ABSOLUTE): 0.4 10*3/uL (ref 0.0–0.4)
EOS: 8 %
HEMATOCRIT: 31.4 % — AB (ref 34.0–46.6)
Hemoglobin: 10.2 g/dL — ABNORMAL LOW (ref 11.1–15.9)
Immature Grans (Abs): 0.1 10*3/uL (ref 0.0–0.1)
Immature Granulocytes: 2 %
LYMPHS ABS: 1 10*3/uL (ref 0.7–3.1)
Lymphs: 19 %
MCH: 32.6 pg (ref 26.6–33.0)
MCHC: 32.5 g/dL (ref 31.5–35.7)
MCV: 100 fL — AB (ref 79–97)
Monocytes Absolute: 0.5 10*3/uL (ref 0.1–0.9)
Monocytes: 9 %
Neutrophils Absolute: 3.4 10*3/uL (ref 1.4–7.0)
Neutrophils: 61 %
PLATELETS: 374 10*3/uL (ref 150–379)
RBC: 3.13 x10E6/uL — AB (ref 3.77–5.28)
RDW: 13.5 % (ref 12.3–15.4)
WBC: 5.4 10*3/uL (ref 3.4–10.8)

## 2017-06-01 LAB — LIPID PANEL
CHOL/HDL RATIO: 2.8 ratio (ref 0.0–4.4)
Cholesterol, Total: 135 mg/dL (ref 100–199)
HDL: 48 mg/dL (ref >39)
LDL CALC: 66 mg/dL (ref 0–99)
Triglycerides: 103 mg/dL (ref 0–149)
VLDL CHOLESTEROL CAL: 21 mg/dL (ref 5–40)

## 2017-06-01 LAB — COMPREHENSIVE METABOLIC PANEL
ALBUMIN: 3.4 g/dL — AB (ref 3.6–4.8)
ALT: 28 IU/L (ref 0–32)
AST: 45 IU/L — ABNORMAL HIGH (ref 0–40)
Albumin/Globulin Ratio: 1.3 (ref 1.2–2.2)
Alkaline Phosphatase: 49 IU/L (ref 39–117)
BUN / CREAT RATIO: 16 (ref 12–28)
BUN: 9 mg/dL (ref 8–27)
Bilirubin Total: <0.2 mg/dL (ref 0.0–1.2)
CALCIUM: 9.2 mg/dL (ref 8.7–10.3)
CO2: 27 mmol/L (ref 20–29)
CREATININE: 0.57 mg/dL (ref 0.57–1.00)
Chloride: 100 mmol/L (ref 96–106)
GFR, EST AFRICAN AMERICAN: 115 mL/min/{1.73_m2} (ref >59)
GFR, EST NON AFRICAN AMERICAN: 100 mL/min/{1.73_m2} (ref >59)
Globulin, Total: 2.7 g/dL (ref 1.5–4.5)
Glucose: 104 mg/dL — ABNORMAL HIGH (ref 65–99)
Potassium: 4.5 mmol/L (ref 3.5–5.2)
Sodium: 142 mmol/L (ref 134–144)
TOTAL PROTEIN: 6.1 g/dL (ref 6.0–8.5)

## 2017-06-10 LAB — VITAMIN B12: Vitamin B-12: 326 pg/mL (ref 232–1245)

## 2017-06-10 LAB — SPECIMEN STATUS REPORT

## 2017-07-23 ENCOUNTER — Telehealth: Payer: Self-pay | Admitting: Physician Assistant

## 2017-07-23 DIAGNOSIS — E119 Type 2 diabetes mellitus without complications: Secondary | ICD-10-CM

## 2017-07-23 MED ORDER — GLUCOSE BLOOD VI STRP: ORAL_STRIP | 12 refills | Status: DC

## 2017-07-23 NOTE — Telephone Encounter (Signed)
Needs refill on her One Touch test strips  [facility]  Pt's call back [phone]  [facility]

## 2017-10-21 ENCOUNTER — Other Ambulatory Visit: Payer: Self-pay | Admitting: Physician Assistant

## 2017-10-21 DIAGNOSIS — E119 Type 2 diabetes mellitus without complications: Secondary | ICD-10-CM

## 2017-10-21 MED ORDER — METFORMIN HCL 500 MG PO TABS: 1000.0000 mg | ORAL_TABLET | Freq: Two times a day (BID) | ORAL | 1 refills | Status: DC

## 2017-10-21 NOTE — Telephone Encounter (Signed)
Pt needs a refill on  Metformin 500 mg  She uses [facility]   Thank s  [place]

## 2017-11-06 ENCOUNTER — Encounter: Payer: Self-pay | Admitting: Physician Assistant

## 2017-11-06 ENCOUNTER — Ambulatory Visit (INDEPENDENT_AMBULATORY_CARE_PROVIDER_SITE_OTHER): Payer: Commercial Managed Care - PPO | Admitting: Physician Assistant

## 2017-11-06 VITALS — BP 90/60 | HR 72 | Temp 98.1°F | Resp 16 | Ht 63.0 in | Wt 112.0 lb

## 2017-11-06 DIAGNOSIS — E039 Hypothyroidism, unspecified: Secondary | ICD-10-CM

## 2017-11-06 DIAGNOSIS — E119 Type 2 diabetes mellitus without complications: Secondary | ICD-10-CM | POA: Diagnosis not present

## 2017-11-06 DIAGNOSIS — Z1231 Encounter for screening mammogram for malignant neoplasm of breast: Secondary | ICD-10-CM

## 2017-11-06 DIAGNOSIS — E7800 Pure hypercholesterolemia, unspecified: Secondary | ICD-10-CM

## 2017-11-06 DIAGNOSIS — Z2821 Immunization not carried out because of patient refusal: Secondary | ICD-10-CM

## 2017-11-06 DIAGNOSIS — Z23 Encounter for immunization: Secondary | ICD-10-CM | POA: Diagnosis not present

## 2017-11-06 DIAGNOSIS — Z1239 Encounter for other screening for malignant neoplasm of breast: Secondary | ICD-10-CM

## 2017-11-06 DIAGNOSIS — Z0000 Encounter for general adult medical examination without abnormal findings: Secondary | ICD-10-CM | POA: Diagnosis not present

## 2017-11-06 LAB — POCT UA - MICROALBUMIN: MICROALBUMIN (UR) POC: 20 mg/L

## 2017-11-06 NOTE — Patient Instructions (Signed)
Health Maintenance for Postmenopausal Women Menopause is a normal process in which your reproductive ability comes to an end. This process happens gradually over a span of months to years, usually between the ages of 22 and 9. Menopause is complete when you have missed 12 consecutive menstrual periods. It is important to talk with your health care provider about some of the most common conditions that affect postmenopausal women, such as heart disease, cancer, and bone loss (osteoporosis). Adopting a healthy lifestyle and getting preventive care can help to promote your health and wellness. Those actions can also lower your chances of developing some of these common conditions. What should I know about menopause? During menopause, you may experience a number of symptoms, such as:  Moderate-to-severe hot flashes.  Night sweats.  Decrease in sex drive.  Mood swings.  Headaches.  Tiredness.  Irritability.  Memory problems.  Insomnia.  Choosing to treat or not to treat menopausal changes is an individual decision that you make with your health care provider. What should I know about hormone replacement therapy and supplements? Hormone therapy products are effective for treating symptoms that are associated with menopause, such as hot flashes and night sweats. Hormone replacement carries certain risks, especially as you become older. If you are thinking about using estrogen or estrogen with progestin treatments, discuss the benefits and risks with your health care provider. What should I know about heart disease and stroke? Heart disease, heart attack, and stroke become more likely as you age. This may be due, in part, to the hormonal changes that your body experiences during menopause. These can affect how your body processes dietary fats, triglycerides, and cholesterol. Heart attack and stroke are both medical emergencies. There are many things that you can do to help prevent heart disease  and stroke:  Have your blood pressure checked at least every 1-2 years. High blood pressure causes heart disease and increases the risk of stroke.  If you are 53-22 years old, ask your health care provider if you should take aspirin to prevent a heart attack or a stroke.  Do not use any tobacco products, including cigarettes, chewing tobacco, or electronic cigarettes. If you need help quitting, ask your health care provider.  It is important to eat a healthy diet and maintain a healthy weight. ? Be sure to include plenty of vegetables, fruits, low-fat dairy products, and lean protein. ? Avoid eating foods that are high in solid fats, added sugars, or salt (sodium).  Get regular exercise. This is one of the most important things that you can do for your health. ? Try to exercise for at least 150 minutes each week. The type of exercise that you do should increase your heart rate and make you sweat. This is known as moderate-intensity exercise. ? Try to do strengthening exercises at least twice each week. Do these in addition to the moderate-intensity exercise.  Know your numbers.Ask your health care provider to check your cholesterol and your blood glucose. Continue to have your blood tested as directed by your health care provider.  What should I know about cancer screening? There are several types of cancer. Take the following steps to reduce your risk and to catch any cancer development as early as possible. Breast Cancer  Practice breast self-awareness. ? This means understanding how your breasts normally appear and feel. ? It also means doing regular breast self-exams. Let your health care provider know about any changes, no matter how small.  If you are 40  or older, have a clinician do a breast exam (clinical breast exam or CBE) every year. Depending on your age, family history, and medical history, it may be recommended that you also have a yearly breast X-ray (mammogram).  If you  have a family history of breast cancer, talk with your health care provider about genetic screening.  If you are at high risk for breast cancer, talk with your health care provider about having an MRI and a mammogram every year.  Breast cancer (BRCA) gene test is recommended for women who have family members with BRCA-related cancers. Results of the assessment will determine the need for genetic counseling and BRCA1 and for BRCA2 testing. BRCA-related cancers include these types: ? Breast. This occurs in males or females. ? Ovarian. ? Tubal. This may also be called fallopian tube cancer. ? Cancer of the abdominal or pelvic lining (peritoneal cancer). ? Prostate. ? Pancreatic.  Cervical, Uterine, and Ovarian Cancer Your health care provider may recommend that you be screened regularly for cancer of the pelvic organs. These include your ovaries, uterus, and vagina. This screening involves a pelvic exam, which includes checking for microscopic changes to the surface of your cervix (Pap test).  For women ages 21-65, health care providers may recommend a pelvic exam and a Pap test every three years. For women ages 79-65, they may recommend the Pap test and pelvic exam, combined with testing for human papilloma virus (HPV), every five years. Some types of HPV increase your risk of cervical cancer. Testing for HPV may also be done on women of any age who have unclear Pap test results.  Other health care providers may not recommend any screening for nonpregnant women who are considered low risk for pelvic cancer and have no symptoms. Ask your health care provider if a screening pelvic exam is right for you.  If you have had past treatment for cervical cancer or a condition that could lead to cancer, you need Pap tests and screening for cancer for at least 20 years after your treatment. If Pap tests have been discontinued for you, your risk factors (such as having a new sexual partner) need to be  reassessed to determine if you should start having screenings again. Some women have medical problems that increase the chance of getting cervical cancer. In these cases, your health care provider may recommend that you have screening and Pap tests more often.  If you have a family history of uterine cancer or ovarian cancer, talk with your health care provider about genetic screening.  If you have vaginal bleeding after reaching menopause, tell your health care provider.  There are currently no reliable tests available to screen for ovarian cancer.  Lung Cancer Lung cancer screening is recommended for adults 69-62 years old who are at high risk for lung cancer because of a history of smoking. A yearly low-dose CT scan of the lungs is recommended if you:  Currently smoke.  Have a history of at least 30 pack-years of smoking and you currently smoke or have quit within the past 15 years. A pack-year is smoking an average of one pack of cigarettes per day for one year.  Yearly screening should:  Continue until it has been 15 years since you quit.  Stop if you develop a health problem that would prevent you from having lung cancer treatment.  Colorectal Cancer  This type of cancer can be detected and can often be prevented.  Routine colorectal cancer screening usually begins at  age 49 and continues through age 73.  If you have risk factors for colon cancer, your health care provider may recommend that you be screened at an earlier age.  If you have a family history of colorectal cancer, talk with your health care provider about genetic screening.  Your health care provider may also recommend using home test kits to check for hidden blood in your stool.  A small camera at the end of a tube can be used to examine your colon directly (sigmoidoscopy or colonoscopy). This is done to check for the earliest forms of colorectal cancer.  Direct examination of the colon should be repeated every  5-10 years until age 53. However, if early forms of precancerous polyps or small growths are found or if you have a family history or genetic risk for colorectal cancer, you may need to be screened more often.  Skin Cancer  Check your skin from head to toe regularly.  Monitor any moles. Be sure to tell your health care provider: ? About any new moles or changes in moles, especially if there is a change in a mole's shape or color. ? If you have a mole that is larger than the size of a pencil eraser.  If any of your family members has a history of skin cancer, especially at a young age, talk with your health care provider about genetic screening.  Always use sunscreen. Apply sunscreen liberally and repeatedly throughout the day.  Whenever you are outside, protect yourself by wearing long sleeves, pants, a wide-brimmed hat, and sunglasses.  What should I know about osteoporosis? Osteoporosis is a condition in which bone destruction happens more quickly than new bone creation. After menopause, you may be at an increased risk for osteoporosis. To help prevent osteoporosis or the bone fractures that can happen because of osteoporosis, the following is recommended:  If you are 30-58 years old, get at least 1,000 mg of calcium and at least 600 mg of vitamin D per day.  If you are older than age 20 but younger than age 63, get at least 1,200 mg of calcium and at least 600 mg of vitamin D per day.  If you are older than age 4, get at least 1,200 mg of calcium and at least 800 mg of vitamin D per day.  Smoking and excessive alcohol intake increase the risk of osteoporosis. Eat foods that are rich in calcium and vitamin D, and do weight-bearing exercises several times each week as directed by your health care provider. What should I know about how menopause affects my mental health? Depression may occur at any age, but it is more common as you become older. Common symptoms of depression  include:  Low or sad mood.  Changes in sleep patterns.  Changes in appetite or eating patterns.  Feeling an overall lack of motivation or enjoyment of activities that you previously enjoyed.  Frequent crying spells.  Talk with your health care provider if you think that you are experiencing depression. What should I know about immunizations? It is important that you get and maintain your immunizations. These include:  Tetanus, diphtheria, and pertussis (Tdap) booster vaccine.  Influenza every year before the flu season begins.  Pneumonia vaccine.  Shingles vaccine.  Your health care provider may also recommend other immunizations. This information is not intended to replace advice given to you by your health care provider. Make sure you discuss any questions you have with your health care provider. Document Released: 03/23/2005  Document Revised: 08/19/2015 Document Reviewed: 11/02/2014 Elsevier Interactive Patient Education  [employer].

## 2017-11-06 NOTE — Progress Notes (Signed)
[name], Female    DOB: 01/30/1956, 62 y.o.   MRN: [id number] Visit Date: 11/06/2017  Today's Provider: Mar Daring, PA-C   Chief Complaint  Patient presents with  . Annual Exam   Subjective:    Annual physical exam Meredith Jones is a 62 y.o. female who presents today for health maintenance and complete physical. She feels well. She reports exercising daily. She reports she is sleeping fairly well.  11/13/16 CPE 11/30/16 Pap/HPV-negative 01/11/17 Mammogram-BI-RADS 1 09/02/15 Colonoscopy-polyps, diverticulosis-pathology report-Tubular adenoma,recheck in 5 years. Dr. Allen Norris  -----------------------------------------------------------------   Review of Systems  Constitutional: Negative.   HENT: Positive for hearing loss.   Eyes: Negative.   Respiratory: Negative.   Cardiovascular: Negative.   Gastrointestinal: Negative.   Endocrine: Positive for polyuria.  Genitourinary: Negative.   Musculoskeletal: Negative.   Skin: Negative.   Allergic/Immunologic: Positive for environmental allergies.  Neurological: Negative.   Hematological: Negative.   Psychiatric/Behavioral: Positive for decreased concentration.    Social History      She  reports that she has never smoked. She has never used smokeless tobacco. She reports that she does not drink alcohol or use drugs.       Social History   Socioeconomic History  . Marital status: Single    Spouse name: Not on file  . Number of children: Not on file  . Years of education: Not on file  . Highest education level: Not on file  Occupational History  . Not on file  Social Needs  . Financial resource strain: Not on file  . Food insecurity:    Worry: Not on file    Inability: Not on file  . Transportation needs:    Medical: Not on file    Non-medical: Not on file  Tobacco Use  . Smoking status: Never Smoker  . Smokeless tobacco: Never Used  Substance and Sexual Activity  . Alcohol use: No    . Drug use: No  . Sexual activity: Not on file  Lifestyle  . Physical activity:    Days per week: Not on file    Minutes per session: Not on file  . Stress: Not on file  Relationships  . Social connections:    Talks on phone: Not on file    Gets together: Not on file    Attends religious service: Not on file    Active member of club or organization: Not on file    Attends meetings of clubs or organizations: Not on file    Relationship status: Not on file  Other Topics Concern  . Not on file  Social History Narrative  . Not on file    Past Medical History:  Diagnosis Date  . Allergy   . Diabetes mellitus without complication ([place])   . Diverticulosis   . Hyperlipidemia   . Hypothyroidism   . Thyroid disease      Patient Active Problem List   Diagnosis Date Noted  . Loss of weight   . Benign neoplasm of descending colon   . Type 2 diabetes mellitus without complication, without long-term current use of insulin ([place]) 06/07/2015  . 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 Cyst of ovary 06/09/2014  . Bulky or enlarged uterus 06/09/2014  . Hypercholesteremia 02/09/2007  . Allergic rhinitis 11/27/2006  . Endometriosis 11/27/2006  . Adult hypothyroidism 11/27/2006    Past Surgical History:  Procedure Laterality Date  . BACK SURGERY  2007   Lumbar Surgery  .  BREAST CYST ASPIRATION Right    Around 10 years ago. Pt thinks it was the right breast but not sure.  Marland Kitchen CATARACT EXTRACTION Left 2010   Right Eye 2011  . COLONOSCOPY WITH PROPOFOL N/A 09/02/2015   Procedure: COLONOSCOPY WITH PROPOFOL;  Surgeon: [name], MD;  Location: [facility];  Service: Endoscopy;  Laterality: N/A;  Diabetic - oral meds  . ESOPHAGOGASTRODUODENOSCOPY (EGD) WITH PROPOFOL N/A 11/03/2015   Procedure: ESOPHAGOGASTRODUODENOSCOPY (EGD) WITH PROPOFOL;  Surgeon: [name], MD;  Location: [facility];  Service: Endoscopy;  Laterality: N/A;  . EYE SURGERY Bilateral 06/2014    tear duck  catherization ( Dr. Vallarie Mare)  . POLYPECTOMY  09/02/2015   Procedure: POLYPECTOMY;  Surgeon: [name], MD;  Location: [facility];  Service: Endoscopy;;  . [place]    Family History        Family Status  Relation Name Status  . Mother  Deceased at age 19  . Father  Deceased at age 56  . PGM greatgm Deceased        Her family history includes Cancer in her father and paternal grandmother; Congestive Heart Failure in her mother; Diabetes in her mother; Hypothyroidism in her father; Stroke in her mother.      Allergies  Allergen Reactions  . Clarithromycin Nausea Only  . Doxycycline     nausea, dizziness.  Marland Kitchen Penicillins Hives     Current Outpatient Medications:  .  aspirin 81 MG tablet, ASPIRIN, 81MG  (Oral Tablet Delayed Release)  1 a week for 0 days  Quantity: 0.00;  Refills: 0   Ordered :02-Oct-2010  Doy Hutching ;  Started 12-Mar-2007 Active, Disp: , Rfl:  .  calcipotriene (DOVONOX) 0.005 % ointment, CALCIPOTRIENE, 0.005% (External Ointment) - Historical Medication  apply to skin daily (0.005 %) Active, Disp: , Rfl:  .  clobetasol ointment (TEMOVATE) 0.05 %, CLOBETASOL PROPIONATE, 0.05% (External Ointment) - Historical Medication  apply to skin daily (0.05 %) Active, Disp: , Rfl:  .  dexamethasone (DECADRON) 0.5 MG/5ML solution, Take 0.5 mg by mouth daily. Use daily one week then off one week, Disp: , Rfl:  .  fexofenadine (ALLEGRA) 60 MG tablet, Take 60 mg by mouth daily. Reported on 09/01/2015, Disp: , Rfl:  .  glipiZIDE (GLUCOTROL) 5 MG tablet, TAKE 1 TABLET BY MOUTH DAILY BEFORE BREAKFAST, Disp: 90 tablet, Rfl: 1 .  glucose blood (ONE TOUCH ULTRA TEST) test strip, USE TO TEST BLOOD SUGAR TWICE A DAY AS INSTRUCTED, Disp: 100 each, Rfl: 12 .  levothyroxine (SYNTHROID) 100 MCG tablet, Take 1 tablet (100 mcg total) by mouth daily., Disp: 90 tablet, Rfl: 1 .  metFORMIN (GLUCOPHAGE) 500 MG tablet, Take 2 tablets (1,000 mg total) by mouth 2 (two) times  daily., Disp: 360 tablet, Rfl: 1 .  MULTIPLE VITAMIN PO, MULTIVITAMINS (Oral Tablet)  1 Every Day for 0 days  Quantity: 0.00;  Refills: 0   Ordered :02-Oct-2010  Doy Hutching ;  Started 12-Mar-2007 Active, Disp: , Rfl:  .  ONETOUCH DELICA LANCETS 27O MISC, USE TO TEST BLOOD SUGAR ONCE A DAY, Disp: 100 each, Rfl: 12 .  simvastatin (ZOCOR) 20 MG tablet, Take 1 tablet (20 mg total) by mouth at bedtime., Disp: 90 tablet, Rfl: 1   Patient Care Team: Mar Daring, PA-C as PCP - General (Family Medicine)      Objective:   Vitals: BP 90/60 (BP Location: Left Arm, Patient Position: Sitting, Cuff Size: Normal)   Pulse 72  Temp 98.1 F (36.7 C) (Oral)   Resp 16   Ht 5\' 3"  (1.6 m)   Wt 112 lb (50.8 kg)   SpO2 98%   BMI 19.84 kg/m    Vitals:   11/06/17 1352  BP: 90/60  Pulse: 72  Resp: 16  Temp: 98.1 F (36.7 C)  TempSrc: Oral  SpO2: 98%  Weight: 112 lb (50.8 kg)  Height: 5\' 3"  (1.6 m)     Physical Exam  Constitutional: She is oriented to person, place, and time. She appears well-developed and well-nourished. No distress.  HENT:  Head: Normocephalic and atraumatic.  Right Ear: Hearing, tympanic membrane, external ear and ear canal normal.  Left Ear: Hearing, tympanic membrane, external ear and ear canal normal.  Nose: Nose normal.  Mouth/Throat: Uvula is midline, oropharynx is clear and moist and mucous membranes are normal. No oropharyngeal exudate.  Eyes: Pupils are equal, round, and reactive to light. Conjunctivae, EOM and lids are normal. Right eye exhibits no discharge. Left eye exhibits no discharge. No scleral icterus.  Neck: Trachea normal and normal range of motion. Neck supple. No JVD present. Carotid bruit is not present. No tracheal deviation present. No thyroid mass and no thyromegaly present.  Cardiovascular: Normal rate, regular rhythm, normal heart sounds and intact distal pulses. Exam reveals no gallop and no friction rub.  No murmur  heard. Pulmonary/Chest: Effort normal and breath sounds normal. No respiratory distress. She has no wheezes. She has no rales. She exhibits no tenderness. Right breast exhibits no inverted nipple, no mass, no nipple discharge, no skin change and no tenderness. Left breast exhibits no inverted nipple, no mass, no nipple discharge, no skin change and no tenderness. No breast swelling, tenderness, discharge or bleeding. Breasts are symmetrical.  Abdominal: Soft. Normal appearance and bowel sounds are normal. She exhibits no distension and no mass. There is no hepatosplenomegaly. There is no tenderness. There is no rebound and no guarding.  Musculoskeletal: Normal range of motion. She exhibits no edema or tenderness.  Lymphadenopathy:    She has no cervical adenopathy.    She has no axillary adenopathy.  Neurological: She is alert and oriented to person, place, and time. She has normal strength. No cranial nerve deficit.  Skin: Skin is warm, dry and intact. No rash noted. She is not diaphoretic.  Psychiatric: She has a normal mood and affect. Her speech is normal and behavior is normal. Judgment and thought content normal. Cognition and memory are normal.  Vitals reviewed.    Depression Screen PHQ 2/9 Scores 11/06/2017 11/13/2016 08/13/2016 07/27/2014  PHQ - 2 Score 0 0 0 0  PHQ- 9 Score 1 1 1  -      Assessment & Plan:     Routine Health Maintenance and Physical Exam  Exercise Activities and Dietary recommendations Goals    . Exercise 150 minutes per week (moderate activity)    . Peak Blood Glucose < 180       Immunization History  Administered Date(s) Administered  . Tdap 01/16/2007    Health Maintenance  Topic Date Due  . PNEUMOCOCCAL POLYSACCHARIDE VACCINE AGE 54-64 HIGH RISK  [birth year]01/1957  . TETANUS/TDAP  01/15/2017  . HEMOGLOBIN A1C  02/13/2017  . FOOT EXAM  05/11/2017  . URINE MICROALBUMIN  07/30/2017  . INFLUENZA VACCINE  09/12/2017  . OPHTHALMOLOGY EXAM  12/17/2017  .  MAMMOGRAM  01/12/2019  . COLONOSCOPY  09/01/2020  . PAP SMEAR  11/30/2021  . Hepatitis C Screening  Completed  . HIV  Screening  Completed     Discussed health benefits of physical activity, and encouraged her to engage in regular exercise appropriate for her age and condition.    1. Annual physical exam Normal exam today. Labs will be checked in December through Dr. Gabriel Carina.   2. Breast cancer screening Breast exam today was normal. There is no family history of breast cancer. She does perform regular self breast exams. Mammogram was ordered as below. Information for [facility] Breast clinic was given to patient so she may schedule her mammogram at her convenience. - MM 3D SCREEN BREAST BILATERAL; Future  3. Type 2 diabetes mellitus without complication, without long-term current use of insulin ([facility]) Microalbumin 20 today. Followed by Dr. Gabriel Carina. A1c in 07/2017 was 6.7. Returns for endocrine f/u in Dec 2019. Continue glipizide 5mg , metformin 1000mg  BID.  - POCT UA - Microalbumin - Pneumococcal polysaccharide vaccine 23-valent greater than or equal to 2yo subcutaneous/IM  4. Hypercholesteremia Stable. Continue Simvastatin 20mg .  5. Adult hypothyroidism Stable. Followed by Dr. Gabriel Carina, endocrinology. Continue levothyroxine 113mcg as directed per Dr. Gabriel Carina.   6. Need for Td vaccine Td booster given to patient without complications. Patient sat for 15 minutes after administration and was tolerated well without adverse effects. - Td vaccine greater than or equal to 7yo preservative free IM  7. Need for pneumococcal vaccination Pneumococcal 23 Vaccine given to patient without complications. Patient sat for 15 minutes after administration and was tolerated well without adverse effects. - Pneumococcal polysaccharide vaccine 23-valent greater than or equal to 2yo subcutaneous/IM  8. Influenza vaccination declined Will get through employer in October.    --------------------------------------------------------------------    Mar Daring, PA-C  [facility]

## 2017-11-11 ENCOUNTER — Telehealth: Payer: Self-pay | Admitting: Physician Assistant

## 2017-11-11 DIAGNOSIS — E7800 Pure hypercholesterolemia, unspecified: Secondary | ICD-10-CM

## 2017-11-11 MED ORDER — SIMVASTATIN 20 MG PO TABS: 20.0000 mg | ORAL_TABLET | Freq: Every day | ORAL | 1 refills | Status: DC

## 2017-11-11 NOTE — Telephone Encounter (Signed)
Sent in

## 2017-11-11 NOTE — Telephone Encounter (Signed)
Pt needs a refill on   Simvastatin 20 mg  She uses [facility]   CB#  [phone]

## 2017-11-14 ENCOUNTER — Encounter: Payer: Self-pay | Admitting: Physician Assistant

## 2018-01-27 LAB — HM DIABETES EYE EXAM

## 2018-04-21 ENCOUNTER — Telehealth: Payer: Self-pay | Admitting: Physician Assistant

## 2018-04-21 DIAGNOSIS — E119 Type 2 diabetes mellitus without complications: Secondary | ICD-10-CM

## 2018-04-21 MED ORDER — METFORMIN HCL 500 MG PO TABS: 1000.0000 mg | ORAL_TABLET | Freq: Two times a day (BID) | ORAL | 1 refills | Status: DC

## 2018-04-21 NOTE — Telephone Encounter (Signed)
She gets A1c testing with Endocrine, Dr. Gabriel Carina. Metformin refilled

## 2018-04-21 NOTE — Telephone Encounter (Signed)
Pt needs a refill on her metformin 500 mg   [facility]  CB#  [phone]  Thanks Con Memos

## 2018-04-21 NOTE — Telephone Encounter (Signed)
Please review, last follow up for DM 11/06/17. Medication last filled 10/21/17. KW

## 2018-05-13 ENCOUNTER — Other Ambulatory Visit: Payer: Self-pay

## 2018-05-13 DIAGNOSIS — E7800 Pure hypercholesterolemia, unspecified: Secondary | ICD-10-CM

## 2018-05-13 MED ORDER — SIMVASTATIN 20 MG PO TABS: 20.0000 mg | ORAL_TABLET | Freq: Every day | ORAL | 1 refills | Status: DC

## 2018-07-21 ENCOUNTER — Other Ambulatory Visit: Payer: Self-pay | Admitting: Physician Assistant

## 2018-07-21 NOTE — Telephone Encounter (Signed)
pt needs a refill on her metformin 500 mg  [facility]  Thanks teri

## 2018-07-22 NOTE — Telephone Encounter (Signed)
Patient still has a refill available at pharmacy. Confirmed by pharmacy.

## 2018-07-23 ENCOUNTER — Emergency Department
Admission: EM | Admit: 2018-07-23 | Discharge: 2018-07-23 | Disposition: A | Discharge status: Home / Self Care | Payer: Commercial Managed Care - PPO | Attending: Emergency Medicine | Admitting: Emergency Medicine

## 2018-07-23 ENCOUNTER — Other Ambulatory Visit: Payer: Self-pay

## 2018-07-23 ENCOUNTER — Emergency Department: Payer: Commercial Managed Care - PPO

## 2018-07-23 ENCOUNTER — Encounter: Payer: Self-pay | Admitting: *Deleted

## 2018-07-23 DIAGNOSIS — R140 Abdominal distension (gaseous): Secondary | ICD-10-CM | POA: Diagnosis present

## 2018-07-23 DIAGNOSIS — R600 Localized edema: Secondary | ICD-10-CM | POA: Insufficient documentation

## 2018-07-23 DIAGNOSIS — E119 Type 2 diabetes mellitus without complications: Secondary | ICD-10-CM | POA: Insufficient documentation

## 2018-07-23 DIAGNOSIS — R062 Wheezing: Secondary | ICD-10-CM | POA: Insufficient documentation

## 2018-07-23 DIAGNOSIS — Z7982 Long term (current) use of aspirin: Secondary | ICD-10-CM | POA: Insufficient documentation

## 2018-07-23 DIAGNOSIS — E039 Hypothyroidism, unspecified: Secondary | ICD-10-CM | POA: Insufficient documentation

## 2018-07-23 DIAGNOSIS — Z79899 Other long term (current) drug therapy: Secondary | ICD-10-CM | POA: Insufficient documentation

## 2018-07-23 DIAGNOSIS — Z7984 Long term (current) use of oral hypoglycemic drugs: Secondary | ICD-10-CM | POA: Insufficient documentation

## 2018-07-23 DIAGNOSIS — Z880 Allergy status to penicillin: Secondary | ICD-10-CM | POA: Insufficient documentation

## 2018-07-23 DIAGNOSIS — R609 Edema, unspecified: Secondary | ICD-10-CM

## 2018-07-23 LAB — BASIC METABOLIC PANEL
Anion gap: 10 (ref 5–15)
BUN: 15 mg/dL (ref 8–23)
CO2: 24 mmol/L (ref 22–32)
Calcium: 8.5 mg/dL — ABNORMAL LOW (ref 8.9–10.3)
Chloride: 104 mmol/L (ref 98–111)
Creatinine, Ser: 0.74 mg/dL (ref 0.44–1.00)
GFR calc Af Amer: >60 mL/min (ref >60)
GFR calc non Af Amer: >60 mL/min (ref >60)
Glucose, Bld: 106 mg/dL — ABNORMAL HIGH (ref 70–99)
Potassium: 4.1 mmol/L (ref 3.5–5.1)
Sodium: 138 mmol/L (ref 135–145)

## 2018-07-23 LAB — CBC
HCT: 32.1 % — ABNORMAL LOW (ref 36.0–46.0)
Hemoglobin: 10.4 g/dL — ABNORMAL LOW (ref 12.0–15.0)
MCH: 32.8 pg (ref 26.0–34.0)
MCHC: 32.4 g/dL (ref 30.0–36.0)
MCV: 101.3 fL — ABNORMAL HIGH (ref 80.0–100.0)
Platelets: 265 10*3/uL (ref 150–400)
RBC: 3.17 MIL/uL — ABNORMAL LOW (ref 3.87–5.11)
RDW: 15.5 % (ref 11.5–15.5)
WBC: 5 10*3/uL (ref 4.0–10.5)
nRBC: 0 % (ref 0.0–0.2)

## 2018-07-23 LAB — HEPATIC FUNCTION PANEL
ALT: 20 U/L (ref 0–44)
AST: 39 U/L (ref 15–41)
Albumin: 2.5 g/dL — ABNORMAL LOW (ref 3.5–5.0)
Alkaline Phosphatase: 50 U/L (ref 38–126)
Bilirubin, Direct: <0.1 mg/dL (ref 0.0–0.2)
Total Bilirubin: 0.5 mg/dL (ref 0.3–1.2)
Total Protein: 6.3 g/dL — ABNORMAL LOW (ref 6.5–8.1)

## 2018-07-23 LAB — BRAIN NATRIURETIC PEPTIDE: B Natriuretic Peptide: 55 pg/mL (ref 0.0–100.0)

## 2018-07-23 LAB — PROTIME-INR
INR: 1 (ref 0.8–1.2)
Prothrombin Time: 12.6 seconds (ref 11.4–15.2)

## 2018-07-23 LAB — TROPONIN I: Troponin I: <0.03 ng/mL (ref <0.03)

## 2018-07-23 MED ORDER — FUROSEMIDE 20 MG PO TABS: 20.0000 mg | ORAL_TABLET | Freq: Every day | ORAL | 0 refills | Status: DC

## 2018-07-23 MED ORDER — SPIRONOLACTONE 50 MG PO TABS: 50.0000 mg | ORAL_TABLET | Freq: Every day | ORAL | 0 refills | Status: DC

## 2018-07-23 NOTE — ED Notes (Signed)
PA at the bedside to discuss results and plan of care.

## 2018-07-23 NOTE — ED Notes (Signed)
Patient discharged to home per MD order. Patient in stable condition, and deemed medically cleared by ED provider for discharge. Discharge instructions reviewed with patient/family using "Teach Back"; verbalized understanding of medication education and administration, and information about follow-up care. Denies further concerns. ° °

## 2018-07-23 NOTE — ED Notes (Signed)
Pt presents with distended abdomen and swollen legs. She reports that she usually weighs around 120 and today she was 140. She states this began 2 weeks ago and has never happened before. Pt denies any sob. Pt alert & oriented with NAD noted.

## 2018-07-23 NOTE — ED Provider Notes (Signed)
Patient was discussed with gastroenterology.  We will place her on 20 mg of Lasix a day and 50 mg of spironolactone.  She will follow-up with Dr. Alice Reichert with gastroenterology as an outpatient.   Earleen Newport, MD 07/23/18 2002

## 2018-07-23 NOTE — ED Provider Notes (Signed)
Patient was seen and examined by me, has extensive abdominal distention and peripheral edema.  Clinically she has the appearance of someone with liver dysfunction, low albumin and low plasma oncotic pressure leading to the swelling.  He probably has ascites as well.  I am adding hepatic function panel, pro time and abdominal ultrasound.   Earleen Newport, MD 07/23/18 [phone]

## 2018-07-23 NOTE — ED Triage Notes (Addendum)
Pt to ED with swelling to both legs x 1-2 weeks. +3 pitting edema noted to both legs. Abd distention noted and pt verbalized 1-2 weeks of swelling in her abd as well. Pt denies CHF or hx of similar symptoms. No SOB.   Pt reporting a 20lb weight gain in 2 weeks.

## 2018-07-23 NOTE — Discharge Instructions (Addendum)
Please begin spinolactone and Lasix to help get some of the fluid off of your abdomen and legs.  Please call Dr. Ricky Stabs office for an appointment tomorrow or Friday.  You are going to need some additional testing completed.  Please return the emergency department for worsening of symptoms.

## 2018-07-23 NOTE — ED Notes (Addendum)
Pt resting on stretcher with no distress noted. Alert and calm at this time. Asking how much longer until she can go home because she has to work tomorrow. Provided for pt comfort and safety and will continue to assess.

## 2018-07-23 NOTE — ED Provider Notes (Signed)
[facility] Emergency Department Provider Note  ____________________________________________  Time seen: Approximately 6:57 PM  I have reviewed the triage vital signs and the nursing notes.   HISTORY  Chief Complaint Fluid Retention    HPI Meredith Jones is a 63 y.o. female that presents to the emergency department for evaluation of abdominal distention and bilateral lower extremity edema for 2 weeks.  She has had some shortness of breath with exertion.  Patient states that she has gained at least 20 pounds in the last 2 weeks.  No recent illness.  No significant past medical history.   Past Medical History:  Diagnosis Date  . Allergy   . Diabetes mellitus without complication ([place])   . Diverticulosis   . Hyperlipidemia   . Hypothyroidism   . Thyroid disease     Patient Active Problem List   Diagnosis Date Noted  . Loss of weight   . Benign neoplasm of descending colon   . Type 2 diabetes mellitus without complication, without long-term current use of insulin ([place]) 06/07/2015  . 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 Cyst of ovary 06/09/2014  . Bulky or enlarged uterus 06/09/2014  . Hypercholesteremia 02/09/2007  . Allergic rhinitis 11/27/2006  . Endometriosis 11/27/2006  . Adult hypothyroidism 11/27/2006    Past Surgical History:  Procedure Laterality Date  . BACK SURGERY  2007   Lumbar Surgery  . BREAST CYST ASPIRATION Right    Around 10 years ago. Pt thinks it was the right breast but not sure.  Marland Kitchen CATARACT EXTRACTION Left 2010   Right Eye 2011  . COLONOSCOPY WITH PROPOFOL N/A 09/02/2015   Procedure: COLONOSCOPY WITH PROPOFOL;  Surgeon: [name], MD;  Location: [place];  Service: Endoscopy;  Laterality: N/A;  Diabetic - oral meds  . ESOPHAGOGASTRODUODENOSCOPY (EGD) WITH PROPOFOL N/A 11/03/2015   Procedure: ESOPHAGOGASTRODUODENOSCOPY (EGD) WITH PROPOFOL;  Surgeon: [name], MD;  Location: [place];  Service:  Endoscopy;  Laterality: N/A;  . EYE SURGERY Bilateral 06/2014    tear duck catherization ( Dr. Vallarie Mare)  . POLYPECTOMY  09/02/2015   Procedure: POLYPECTOMY;  Surgeon: [name], MD;  Location: [place];  Service: Endoscopy;;  . TONSILLECTOMY  1963    Prior to Admission medications   Medication Sig Start Date End Date Taking? Authorizing Provider  aspirin 81 MG tablet ASPIRIN, 81MG  (Oral Tablet Delayed Release)  1 a week for 0 days  Quantity: 0.00;  Refills: 0   Ordered :02-Oct-2010  Doy Hutching ;  Started 12-Mar-2007 Active 03/12/07   [provider]  calcipotriene (DOVONOX) 0.005 % ointment CALCIPOTRIENE, 0.005% (External Ointment) - Historical Medication  apply to skin daily (0.005 %) Active    [provider]  clobetasol ointment (TEMOVATE) 0.05 % CLOBETASOL PROPIONATE, 0.05% (External Ointment) - Historical Medication  apply to skin daily (0.05 %) Active    [provider]  dexamethasone (DECADRON) 0.5 MG/5ML solution Take 0.5 mg by mouth daily. Use daily one week then off one week    [provider]  fexofenadine (ALLEGRA) 60 MG tablet Take 60 mg by mouth daily. Reported on 09/01/2015    [provider]  furosemide (LASIX) 20 MG tablet Take 1 tablet (20 mg total) by mouth daily. 07/23/18 07/23/19  Laban Emperor, PA-C  glipiZIDE (GLUCOTROL) 5 MG tablet TAKE 1 TABLET BY MOUTH DAILY BEFORE BREAKFAST 05/09/17   Mar Daring, PA-C  glucose blood (ONE TOUCH ULTRA TEST) test strip USE TO TEST BLOOD SUGAR TWICE A  DAY AS INSTRUCTED 07/23/17   Mar Daring, PA-C  levothyroxine (SYNTHROID) 100 MCG tablet Take 1 tablet (100 mcg total) by mouth daily. 05/11/16   Mar Daring, PA-C  metFORMIN (GLUCOPHAGE) 500 MG tablet Take 2 tablets (1,000 mg total) by mouth 2 (two) times daily. 04/21/18   Mar Daring, PA-C  MULTIPLE VITAMIN PO MULTIVITAMINS (Oral Tablet)  1 Every Day for 0 days  Quantity: 0.00;  Refills: 0   Ordered  :02-Oct-2010  Doy Hutching ;  Started 12-Mar-2007 Active 03/12/07   [provider]  Jonetta Speak LANCETS 49F MISC USE TO TEST BLOOD SUGAR ONCE A DAY 11/28/15   Mar Daring, PA-C  simvastatin (ZOCOR) 20 MG tablet Take 1 tablet (20 mg total) by mouth at bedtime. 05/13/18   Mar Daring, PA-C  spironolactone (ALDACTONE) 50 MG tablet Take 1 tablet (50 mg total) by mouth daily. 07/23/18   Laban Emperor, PA-C    Allergies Clarithromycin; Doxycycline; and Penicillins  Family History  Problem Relation Age of Onset  . Congestive Heart Failure Mother   . Diabetes Mother   . Stroke Mother   . Hypothyroidism Father   . Cancer Father        lung cancer  . Cancer Paternal Grandmother        stomach    Social History Social History   Tobacco Use  . Smoking status: Never Smoker  . Smokeless tobacco: Never Used  Substance Use Topics  . Alcohol use: No  . Drug use: No     Review of Systems  Constitutional: No fever/chills Cardiovascular: No chest pain. Respiratory: No cough. Positive for SOB with exertion.  Gastrointestinal: No abdominal pain.  No nausea, no vomiting.  Musculoskeletal: Negative for musculoskeletal pain. Skin: Negative for rash, abrasions, lacerations, ecchymosis. Neurological: Negative for headaches   ____________________________________________   PHYSICAL EXAM:  VITAL SIGNS: ED Triage Vitals  Enc Vitals Group     BP 07/23/18 1144 108/73     Pulse Rate 07/23/18 1144 94     Resp 07/23/18 1144 16     Temp 07/23/18 1144 (!) 97.4 F (36.3 C)     Temp Source 07/23/18 1144 Oral     SpO2 07/23/18 1144 99 %     Weight 07/23/18 1141 143 lb (64.9 kg)     Height 07/23/18 1141 5\' 3"  (1.6 m)     Head Circumference --      Peak Flow --      Pain Score 07/23/18 1141 2     Pain Loc --      Pain Edu? --      Excl. in [place]? --      Constitutional: Alert and oriented. Well appearing and in no acute distress. Eyes: Conjunctivae are  normal. PERRL. EOMI. Head: Atraumatic. ENT:      Ears:      Nose: No congestion/rhinnorhea.      Mouth/Throat: Mucous membranes are moist.  Neck: No stridor.   Cardiovascular: Normal rate, regular rhythm.  Good peripheral circulation. Respiratory: Normal respiratory effort without tachypnea or retractions. Lungs CTAB. Good air entry to the bases with no decreased or absent breath sounds. Gastrointestinal: Bowel sounds 4 quadrants. Soft and nontender to palpation. No guarding or rigidity. No palpable masses. Moderate abdominal distention.  Musculoskeletal: Full range of motion to all extremities. No gross deformities appreciated. Neurologic:  Normal speech and language. No gross focal neurologic deficits are appreciated.  Skin:  Skin is warm, dry and intact. 3+  bilateral lower extremity pitting edema. Psychiatric: Mood and affect are normal. Speech and behavior are normal. Patient exhibits appropriate insight and judgement.   ____________________________________________   LABS (all labs ordered are listed, but only abnormal results are displayed)  Labs Reviewed  BASIC METABOLIC PANEL - Abnormal; Notable for the following components:      Result Value   Glucose, Bld 106 (*)    Calcium 8.5 (*)    All other components within normal limits  CBC - Abnormal; Notable for the following components:   RBC 3.17 (*)    Hemoglobin 10.4 (*)    HCT 32.1 (*)    MCV 101.3 (*)    All other components within normal limits  HEPATIC FUNCTION PANEL - Abnormal; Notable for the following components:   Total Protein 6.3 (*)    Albumin 2.5 (*)    All other components within normal limits  TROPONIN I  BRAIN NATRIURETIC PEPTIDE  PROTIME-INR   ____________________________________________  EKG   ____________________________________________  RADIOLOGY Robinette Haines, personally viewed and evaluated these images (plain radiographs) as part of my medical decision making, as well as reviewing the  written report by the radiologist.  Dg Chest 2 View  Result Date: 07/23/2018 CLINICAL DATA:  Fluid retention for 2 weeks. EXAM: CHEST - 2 VIEW COMPARISON:  Chest CT 07/12/2015 FINDINGS: The cardiac silhouette, mediastinal and hilar contours are within normal limits and stable. The lungs are clear of an acute process. No pleural effusions. No worrisome pulmonary lesions. The right nipple shadows overlying the right lower chest. Remote surgical changes from a thoracic spine corpectomy and lateral fusion. No complicating features. IMPRESSION: No acute cardiopulmonary findings. Electronically Signed   By: Marijo Sanes M.D.   On: 07/23/2018 16:56   US Abdomen Complete  Result Date: 07/23/2018 CLINICAL DATA:  Abdominal distension for 1 week with weight gain EXAM: ABDOMEN ULTRASOUND COMPLETE COMPARISON:  07/12/2015 CT chest, abdomen and pelvis. FINDINGS: Gallbladder: Mild diffuse gallbladder wall thickening. No gallbladder distention. No gallstones. No sonographic Murphy sign. Common bile duct: Diameter: 3 mm Liver: No focal lesion identified. No convincing liver surface irregularity. Normal liver parenchymal echogenicity and echotexture. Portal vein is patent on color Doppler imaging with normal direction of blood flow towards the liver. IVC: No abnormality visualized. Pancreas: Visualized portion unremarkable. Spleen: Size and appearance within normal limits. Right Kidney: Length: 8.2 cm. Echogenicity within normal limits. No mass or hydronephrosis visualized. Left Kidney: Length: 9.7 cm. Echogenicity within normal limits. No mass or hydronephrosis visualized. Abdominal aorta: No aneurysm visualized. Other findings: Large volume ascites, most prominent in the left lower quadrant. IMPRESSION: 1. Large volume ascites. 2. No overt morphologic changes of hepatic cirrhosis. 3. Normal size spleen. 4. Nonspecific mild diffuse gallbladder wall thickening, suspect noninflammatory edema. No cholelithiasis. No biliary  ductal dilatation. Electronically Signed   By: Ilona Sorrel M.D.   On: 07/23/2018 19:45    ____________________________________________    PROCEDURES  Procedure(s) performed:    Procedures    Medications - No data to display   ____________________________________________   INITIAL IMPRESSION / ASSESSMENT AND PLAN / ED COURSE  Pertinent labs & imaging results that were available during my care of the patient were reviewed by me and considered in my medical decision making (see chart for details).  Review of the [place] CSRS was performed in accordance of the [place] prior to dispensing any controlled drugs.   Patient presented to the emergency department for evaluation of abdominal distention and bilateral lower extremity edema.  Lab work is overall reassuring.  Patient was evaluated by Dr. Jimmye Norman in the emergency department.  Dr. Jimmye Norman has consulted Dr. Alice Reichert with GI and recommends that patient be discharged home with spironolactone and Lasix.  Patient is requesting to go home and is ready to leave.  She is comfortable going home.  Patient will be discharged home with prescriptions for spironolactone and Lasix. Patient is to follow up with GI as directed. Patient will call in the morning for an appointment. Patient is given ED precautions to return to the ED for any worsening or new symptoms.     ____________________________________________  FINAL CLINICAL IMPRESSION(S) / ED DIAGNOSES  Final diagnoses:  Fluid retention      NEW MEDICATIONS STARTED DURING THIS VISIT:  ED Discharge Orders         Ordered    spironolactone (ALDACTONE) 50 MG tablet  Daily     07/23/18 2005    furosemide (LASIX) 20 MG tablet  Daily     07/23/18 2005              This chart was dictated using voice recognition software/Dragon. Despite best efforts to proofread, errors can occur which can change the meaning. Any change was purely unintentional.    Laban Emperor, PA-C 07/23/18  2050    Earleen Newport, MD 07/23/18 2105

## 2018-07-28 ENCOUNTER — Other Ambulatory Visit: Payer: Self-pay | Admitting: Internal Medicine

## 2018-07-28 DIAGNOSIS — R188 Other ascites: Secondary | ICD-10-CM

## 2018-07-28 DIAGNOSIS — R601 Generalized edema: Secondary | ICD-10-CM

## 2018-07-29 ENCOUNTER — Other Ambulatory Visit: Payer: Self-pay | Admitting: Internal Medicine

## 2018-07-29 ENCOUNTER — Ambulatory Visit
Admission: RE | Admit: 2018-07-29 | Discharge: 2018-07-29 | Disposition: A | Discharge status: Home / Self Care | Payer: Commercial Managed Care - PPO | Source: Ambulatory Visit | Attending: Internal Medicine | Admitting: Internal Medicine

## 2018-07-29 ENCOUNTER — Other Ambulatory Visit: Payer: Self-pay

## 2018-07-29 DIAGNOSIS — R188 Other ascites: Secondary | ICD-10-CM | POA: Diagnosis present

## 2018-07-29 DIAGNOSIS — R601 Generalized edema: Secondary | ICD-10-CM | POA: Insufficient documentation

## 2018-07-29 DIAGNOSIS — R140 Abdominal distension (gaseous): Secondary | ICD-10-CM

## 2018-07-29 LAB — BODY FLUID CELL COUNT WITH DIFFERENTIAL
Eos, Fluid: 0 %
Lymphs, Fluid: 30 %
Monocyte-Macrophage-Serous Fluid: 68 %
Neutrophil Count, Fluid: 2 %
Total Nucleated Cell Count, Fluid: 802 cu mm

## 2018-07-29 LAB — LACTATE DEHYDROGENASE, PLEURAL OR PERITONEAL FLUID: LD, Fluid: 432 U/L — ABNORMAL HIGH (ref 3–23)

## 2018-07-29 LAB — AMYLASE, PLEURAL OR PERITONEAL FLUID: Amylase, Fluid: 43 U/L

## 2018-07-29 LAB — ALBUMIN, PLEURAL OR PERITONEAL FLUID: Albumin, Fluid: 1.8 g/dL

## 2018-07-29 NOTE — Procedures (Signed)
Interventional Radiology Procedure Note  Procedure: US guided paracentesis.  Complications: None Recommendations:  - Ok to shower tomorrow - Do not submerge for 7 days - Routine care   Signed,  Celena Lanius S. Nomie Buchberger, DO    

## 2018-07-30 LAB — TRIGLYCERIDES, BODY FLUIDS: Triglycerides, Fluid: 36 mg/dL

## 2018-07-30 LAB — ACID FAST SMEAR (AFB, MYCOBACTERIA): Acid Fast Smear: NEGATIVE

## 2018-07-31 ENCOUNTER — Other Ambulatory Visit: Payer: Self-pay | Admitting: Anatomic Pathology & Clinical Pathology

## 2018-07-31 ENCOUNTER — Other Ambulatory Visit: Payer: Self-pay | Admitting: Internal Medicine

## 2018-07-31 ENCOUNTER — Telehealth: Payer: Self-pay

## 2018-07-31 DIAGNOSIS — R140 Abdominal distension (gaseous): Secondary | ICD-10-CM

## 2018-07-31 LAB — CYTOLOGY - NON PAP

## 2018-07-31 NOTE — Telephone Encounter (Signed)
Pathology reviewed with Robyn at Dr. Ricky Stabs office. Recommended referrals to gyn onc and med onc. If able they can perform CT CAP and CA 125. Otherwise oncology can complete workup. Once referrals are received we can arrange gyn onc on 6/24.

## 2018-08-01 ENCOUNTER — Telehealth: Payer: Self-pay

## 2018-08-01 NOTE — Telephone Encounter (Signed)
Referral was received. Call placed to Meredith Jones to arrange appointments. She has her CT scheduled for Monday 6/22. We would like to see her 6/24 at 1230 for gyn onc and 1330 for med onc. Left my contact information on her voicemail, to call and confirm these dates/times.

## 2018-08-04 ENCOUNTER — Telehealth: Payer: Self-pay

## 2018-08-04 ENCOUNTER — Other Ambulatory Visit: Payer: Self-pay

## 2018-08-04 ENCOUNTER — Ambulatory Visit
Admission: RE | Admit: 2018-08-04 | Discharge: 2018-08-04 | Disposition: A | Discharge status: Home / Self Care | Payer: Commercial Managed Care - PPO | Source: Ambulatory Visit | Attending: Internal Medicine | Admitting: Internal Medicine

## 2018-08-04 DIAGNOSIS — C569 Malignant neoplasm of unspecified ovary: Secondary | ICD-10-CM

## 2018-08-04 DIAGNOSIS — R140 Abdominal distension (gaseous): Secondary | ICD-10-CM | POA: Diagnosis present

## 2018-08-04 HISTORY — DX: Malignant neoplasm of unspecified ovary: C56.9

## 2018-08-04 MED ORDER — IOHEXOL 300 MG/ML  SOLN
75.0000 mL | Freq: Once | INTRAMUSCULAR | Status: AC | PRN
  Administered 2018-08-04: 75 mL via INTRAVENOUS

## 2018-08-04 NOTE — Telephone Encounter (Signed)
Voicemail left with Meredith Jones with appointment details for this Wednesday, 6-24. Asked that she return call to ensure she has received her appointments. Ct scan scheduled for today.

## 2018-08-04 NOTE — Telephone Encounter (Signed)
Received call back from Meredith Jones. Reviewed appointments for 6/24 with her. She states she cannot come to appointments on 6/24 due to work. I explained the importance of her coming to see gyn onc on 6/24 to not delay her care. Discussed the importance of seeing them as they may want to do surgery upfront. She was adamant that she cannot come 6/24 or 6/25 and states she can only come this Friday, 6/26. I explained that gyn onc is not in clinic except for Wednesdays and that I could reschedule her medical oncology appointment for this Friday, but she ultimately needs to see gyn onc. She again refused to come this Wednesday. Gyn onc appointment rescheduled to 7/1 at 1200. She states she will discuss that with her employer. She does not think he has made the schedule yet. Her medical oncology appointment has been rescheduled as requested to 6/26 at 1145 with Meredith Jones. Ensured that she has directions to the cancer center.

## 2018-08-06 ENCOUNTER — Inpatient Hospital Stay: Payer: Commercial Managed Care - PPO | Admitting: Oncology

## 2018-08-06 ENCOUNTER — Ambulatory Visit: Payer: Commercial Managed Care - PPO

## 2018-08-07 ENCOUNTER — Other Ambulatory Visit: Payer: Commercial Managed Care - PPO

## 2018-08-07 ENCOUNTER — Other Ambulatory Visit: Payer: Self-pay | Admitting: Nurse Practitioner

## 2018-08-07 DIAGNOSIS — C801 Malignant (primary) neoplasm, unspecified: Secondary | ICD-10-CM

## 2018-08-07 NOTE — Progress Notes (Signed)
Tumor Board Documentation  Meredith Jones was presented by Verlon Au, RN at our Tumor Board on 08/07/2018, which included representatives from medical oncology, radiation oncology, surgical oncology, surgical, radiology, pathology, navigation, internal medicine, genetics, research, pulmonology.  Meredith Jones currently presents as a new patient, for new positive pathology, for Meredith Jones with history of the following treatments: active survellience.  Additionally, we reviewed previous medical and familial history, history of present illness, and recent lab results along with all available histopathologic and imaging studies. The tumor board considered available treatment options and made the following recommendations: Surgery, Adjuvant chemotherapy Refer to GYN Oncology for possible surgery  The following procedures/referrals were also placed: No orders of the defined types were placed in this encounter.   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  Tumor board is a meeting of clinicians from various specialty areas who evaluate and discuss patients for whom a multidisciplinary approach is being considered. Final determinations in the plan of care are those of the provider(s). The responsibility for follow up of recommendations given during tumor board is that of the provider.   Today's extended care, comprehensive team conference, Meredith Jones was not present for the discussion and was not examined.   Multidisciplinary Tumor Board is a multidisciplinary case peer review process.  Decisions discussed in the Multidisciplinary Tumor Board reflect the opinions of the specialists present at the conference without having examined the patient.  Ultimately, treatment and diagnostic decisions rest with the primary provider(s) and the patient.

## 2018-08-07 NOTE — Progress Notes (Signed)
Discussed case with Dr. Theora Gianotti and discussed with Dr. Janese Banks. Labs ordered for 6/26 and appointment added. Called patient to discuss. Left message.

## 2018-08-08 ENCOUNTER — Encounter: Payer: Self-pay | Admitting: Oncology

## 2018-08-08 ENCOUNTER — Other Ambulatory Visit: Payer: Self-pay | Admitting: Oncology

## 2018-08-08 ENCOUNTER — Inpatient Hospital Stay: Payer: Commercial Managed Care - PPO | Attending: Oncology | Admitting: Oncology

## 2018-08-08 ENCOUNTER — Inpatient Hospital Stay: Payer: Commercial Managed Care - PPO

## 2018-08-08 ENCOUNTER — Other Ambulatory Visit: Payer: Self-pay

## 2018-08-08 VITALS — BP 95/61 | HR 97 | Temp 98.0°F | Resp 18 | Ht 63.0 in | Wt 118.0 lb

## 2018-08-08 DIAGNOSIS — D539 Nutritional anemia, unspecified: Secondary | ICD-10-CM

## 2018-08-08 DIAGNOSIS — R946 Abnormal results of thyroid function studies: Secondary | ICD-10-CM | POA: Diagnosis not present

## 2018-08-08 DIAGNOSIS — R64 Cachexia: Secondary | ICD-10-CM | POA: Diagnosis not present

## 2018-08-08 DIAGNOSIS — C569 Malignant neoplasm of unspecified ovary: Secondary | ICD-10-CM

## 2018-08-08 DIAGNOSIS — E039 Hypothyroidism, unspecified: Secondary | ICD-10-CM

## 2018-08-08 DIAGNOSIS — D509 Iron deficiency anemia, unspecified: Secondary | ICD-10-CM

## 2018-08-08 DIAGNOSIS — Z7189 Other specified counseling: Secondary | ICD-10-CM

## 2018-08-08 DIAGNOSIS — R7989 Other specified abnormal findings of blood chemistry: Secondary | ICD-10-CM

## 2018-08-08 DIAGNOSIS — C801 Malignant (primary) neoplasm, unspecified: Secondary | ICD-10-CM

## 2018-08-08 LAB — CBC WITH DIFFERENTIAL/PLATELET
Abs Immature Granulocytes: 0.02 10*3/uL (ref 0.00–0.07)
Basophils Absolute: 0.1 10*3/uL (ref 0.0–0.1)
Basophils Relative: 1 %
Eosinophils Absolute: 0.4 10*3/uL (ref 0.0–0.5)
Eosinophils Relative: 8 %
HCT: 33.8 % — ABNORMAL LOW (ref 36.0–46.0)
Hemoglobin: 10.8 g/dL — ABNORMAL LOW (ref 12.0–15.0)
Immature Granulocytes: 0 %
Lymphocytes Relative: 19 %
Lymphs Abs: 1 10*3/uL (ref 0.7–4.0)
MCH: 32.4 pg (ref 26.0–34.0)
MCHC: 32 g/dL (ref 30.0–36.0)
MCV: 101.5 fL — ABNORMAL HIGH (ref 80.0–100.0)
Monocytes Absolute: 0.6 10*3/uL (ref 0.1–1.0)
Monocytes Relative: 11 %
Neutro Abs: 3.1 10*3/uL (ref 1.7–7.7)
Neutrophils Relative %: 61 %
Platelets: 306 10*3/uL (ref 150–400)
RBC: 3.33 MIL/uL — ABNORMAL LOW (ref 3.87–5.11)
RDW: 15.5 % (ref 11.5–15.5)
WBC: 5.2 10*3/uL (ref 4.0–10.5)
nRBC: 0 % (ref 0.0–0.2)

## 2018-08-08 LAB — COMPREHENSIVE METABOLIC PANEL
ALT: 23 U/L (ref 0–44)
AST: 35 U/L (ref 15–41)
Albumin: 3.1 g/dL — ABNORMAL LOW (ref 3.5–5.0)
Alkaline Phosphatase: 49 U/L (ref 38–126)
Anion gap: 11 (ref 5–15)
BUN: 17 mg/dL (ref 8–23)
CO2: 29 mmol/L (ref 22–32)
Calcium: 8.9 mg/dL (ref 8.9–10.3)
Chloride: 97 mmol/L — ABNORMAL LOW (ref 98–111)
Creatinine, Ser: 0.87 mg/dL (ref 0.44–1.00)
GFR calc Af Amer: >60 mL/min (ref >60)
GFR calc non Af Amer: >60 mL/min (ref >60)
Glucose, Bld: 92 mg/dL (ref 70–99)
Potassium: 4.1 mmol/L (ref 3.5–5.1)
Sodium: 137 mmol/L (ref 135–145)
Total Bilirubin: 0.3 mg/dL (ref 0.3–1.2)
Total Protein: 7.2 g/dL (ref 6.5–8.1)

## 2018-08-08 LAB — IRON AND TIBC
Iron: 39 ug/dL (ref 28–170)
Saturation Ratios: 13 % (ref 10.4–31.8)
TIBC: 292 ug/dL (ref 250–450)
UIBC: 253 ug/dL

## 2018-08-08 LAB — FERRITIN: Ferritin: 22 ng/mL (ref 11–307)

## 2018-08-08 LAB — TSH: TSH: 46.461 u[IU]/mL — ABNORMAL HIGH (ref 0.350–4.500)

## 2018-08-08 LAB — VITAMIN B12: Vitamin B-12: 142 pg/mL — ABNORMAL LOW (ref 180–914)

## 2018-08-08 LAB — T4, FREE: Free T4: 0.74 ng/dL (ref 0.61–1.12)

## 2018-08-09 LAB — T3, FREE: T3, Free: 1.1 pg/mL — ABNORMAL LOW (ref 2.0–4.4)

## 2018-08-09 LAB — CA 125: Cancer Antigen (CA) 125: 491 U/mL — ABNORMAL HIGH (ref 0.0–38.1)

## 2018-08-11 ENCOUNTER — Telehealth: Payer: Self-pay | Admitting: Nurse Practitioner

## 2018-08-11 ENCOUNTER — Encounter: Payer: Self-pay | Admitting: Oncology

## 2018-08-11 DIAGNOSIS — C569 Malignant neoplasm of unspecified ovary: Secondary | ICD-10-CM | POA: Insufficient documentation

## 2018-08-11 DIAGNOSIS — Z7189 Other specified counseling: Secondary | ICD-10-CM | POA: Insufficient documentation

## 2018-08-11 NOTE — Progress Notes (Signed)
Hematology/Oncology Consult note [facility] [phone] [phone]  Patient Care Team: Rubye Beach as PCP - General (Family Medicine) Clent Jacks, RN as Oncology Nurse Navigator   Name of the [name]  [id number]  03/31/1955    Reason for referral- newly diagnosed high grade serous adenocarcinoma   Referring physician- Dr. Alice Reichert  Date of visit: 08/11/18   History of presenting illness- Patient is a 63 year old female with a past medical history significant for hyperlipidemia hypothyroidism and diabetes.  She presented with symptoms of bilateral lower extremity swelling as well as abdominal distention.GI.  This was followed by a CT abdomen and chest imaging with contrast.  CT showed cystic lesions of the enlarged bilateral ovaries the largest lesion on the left measuring 6.2 cm.  Patient also noted to have numerous bulky retroperitoneal lymph nodes, largest left periaortic node or conglomerates near the superior mesenteric artery measuring at least 4.4 x 2.9 cm.  Significant ascites was present.  Patient underwent paracentesis and cytology from the fluid showed high-grade serous carcinoma.  Tumor cells were positive for CK7, PAX 8, p53 and WT 1.  Patient has not seen GYN oncology yet given her work timings.  She works as a Scientist, water quality.  She lives with her boyfriend.  She reports about 35 pound weight loss over the last 4 to 5 months.  Reports that her appetite is good and she is eating well but continues to lose weight.  Denies any pain at this time.  Reports ongoing fatigue.  ECOG PS- 1  Pain scale- 0   Review of systems- Review of Systems  Constitutional: Positive for malaise/fatigue and weight loss. Negative for chills and fever.  HENT: Negative for congestion, ear discharge and nosebleeds.   Eyes: Negative for blurred vision.  Respiratory: Negative for cough, hemoptysis, sputum production, shortness of  breath and wheezing.   Cardiovascular: Negative for chest pain, palpitations, orthopnea and claudication.  Gastrointestinal: Negative for abdominal pain, blood in stool, constipation, diarrhea, heartburn, melena, nausea and vomiting.       Abdominal distention  Genitourinary: Negative for dysuria, flank pain, frequency, hematuria and urgency.  Musculoskeletal: Negative for back pain, joint pain and myalgias.  Skin: Negative for rash.  Neurological: Negative for dizziness, tingling, focal weakness, seizures, weakness and headaches.  Endo/Heme/Allergies: Does not bruise/bleed easily.  Psychiatric/Behavioral: Negative for depression and suicidal ideas. The patient does not have insomnia.     Allergies  Allergen Reactions   Clarithromycin Nausea Only   Doxycycline     nausea, dizziness.   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 Pt thinks it was the right  breast but not sure.   CATARACT EXTRACTION Left 2010   Right Eye 2011   COLONOSCOPY WITH PROPOFOL N/A 09/02/2015   Procedure: COLONOSCOPY WITH PROPOFOL;  Surgeon: [name], MD;  Location: [place];  Service: Endoscopy;  Laterality: N/A;  Diabetic -  oral meds   ESOPHAGOGASTRODUODENOSCOPY (EGD) WITH PROPOFOL N/A 11/03/2015   Procedure: ESOPHAGOGASTRODUODENOSCOPY (EGD) WITH PROPOFOL;  Surgeon: [name], MD;  Location: [place];  Service: Endoscopy;  Laterality: N/A;   EYE SURGERY Bilateral 06/2014    tear duck catherization ( Dr. Vallarie Mare)   POLYPECTOMY  09/02/2015   Procedure: POLYPECTOMY;  Surgeon: [name], MD;  Location: [place];  Service: Endoscopy;;   TONSILLECTOMY  1963    Social History   Socioeconomic History   Marital status: Single    Spouse name: Not on file   Number of children: Not on file   Years of education: Not on file   Highest education level: Not on file  Occupational History   Not on file  Social Needs   Financial resource strain: Not on file   Food insecurity    Worry: Not on file    Inability: Not on file   Transportation needs    Medical: Not on file    Non-medical: Not on file  Tobacco Use   Smoking status: Never Smoker   Smokeless tobacco: Never Used  Substance and Sexual Activity   Alcohol use: No   Drug use: No   Sexual activity: Not on file  Lifestyle   Physical activity    Days per week: Not on file    Minutes per session: Not on file   Stress: Not on file  Relationships   Social connections    Talks on phone: Not on file    Gets together: Not on file    Attends religious service: Not on file    Active member of club or organization: Not on file    Attends meetings of clubs or organizations: Not on file    Relationship status: Not on file   Intimate partner violence    Fear of current or ex partner: Not on file    Emotionally abused: Not on file    Physically abused: Not on file    Forced sexual activity: Not on file  Other Topics Concern   Not on file  Social History Narrative   Not on file     Family History  Problem Relation Age of Onset   Congestive Heart Failure Mother    Diabetes Mother    Stroke Mother     Hypothyroidism Father    Cancer Father        lung cancer   Cancer Paternal Grandmother        stomach     Current Outpatient Medications:    aspirin 81 MG tablet, ASPIRIN, '81MG'$  (Oral Tablet Delayed Release)  1 a week for 0 days  Quantity: 0.00;  Refills: 0   Ordered :02-Oct-2010  Doy Hutching ;  Started 12-Mar-2007 Active, Disp: , Rfl:    furosemide (LASIX) 20 MG tablet, Take 1 tablet (20 mg total) by mouth daily., Disp: 20 tablet, Rfl: 0   glipiZIDE (GLUCOTROL) 5 MG tablet, TAKE 1 TABLET BY MOUTH DAILY BEFORE BREAKFAST, Disp: 90 tablet, Rfl: 1   glucose blood (ONE TOUCH ULTRA TEST) test strip, USE TO TEST BLOOD SUGAR TWICE A DAY AS INSTRUCTED, Disp: 100 each, Rfl: 12   levothyroxine (SYNTHROID) 100 MCG tablet, Take 1  tablet (100 mcg total) by mouth daily., Disp: 90 tablet, Rfl: 1   metFORMIN (GLUCOPHAGE) 500 MG tablet, Take 2 tablets (1,000 mg total) by mouth 2 (two) times daily., Disp: 360 tablet, Rfl: 1   ONETOUCH DELICA LANCETS 81L MISC, USE TO TEST BLOOD SUGAR ONCE A DAY, Disp: 100 each, Rfl: 12   simvastatin (ZOCOR) 20 MG tablet, Take 1 tablet (20 mg total) by mouth at bedtime., Disp: 90 tablet, Rfl: 1   spironolactone (ALDACTONE) 50 MG tablet, Take 1 tablet (50 mg total) by mouth daily., Disp: 20 tablet, Rfl: 0   calcipotriene (DOVONOX) 0.005 % ointment, CALCIPOTRIENE, 0.005% (External Ointment) - Historical Medication  apply to skin daily (0.005 %) Active, Disp: , Rfl:    clobetasol ointment (TEMOVATE) 0.05 %, CLOBETASOL PROPIONATE, 0.05% (External Ointment) - Historical Medication  apply to skin daily (0.05 %) Active, Disp: , Rfl:    dexamethasone (DECADRON) 0.5 MG/5ML solution, Take 0.5 mg by mouth daily. Use daily one week then off one week, Disp: , Rfl:    fexofenadine (ALLEGRA) 60 MG tablet, Take 60 mg by mouth daily. Reported on 09/01/2015, Disp: , Rfl:    MULTIPLE VITAMIN PO, MULTIVITAMINS (Oral Tablet)  1 Every Day for 0 days  Quantity: 0.00;  Refills: 0    Ordered :02-Oct-2010  Doy Hutching ;  Started 12-Mar-2007 Active, Disp: , Rfl:    Physical exam:  Vitals:   08/08/18 1147  BP: 95/61  Pulse: 97  Resp: 18  Temp: 98 F (36.7 C)  TempSrc: Tympanic  SpO2: 100%  Weight: 118 lb (53.5 kg)  Height: '5\' 3"'$  (1.6 m)   Physical Exam Constitutional:      Comments: Patient is thin and cachectic  HENT:     Head: Normocephalic and atraumatic.  Eyes:     Pupils: Pupils are equal, round, and reactive to light.  Neck:     Musculoskeletal: Normal range of motion.  Cardiovascular:     Rate and Rhythm: Normal rate and regular rhythm.     Heart sounds: Normal heart sounds.  Pulmonary:     Effort: Pulmonary effort is normal.     Breath sounds: Normal breath sounds.  Abdominal:     General: Bowel sounds are normal. There is distension.     Palpations: Abdomen is soft.     Comments: Ascites positive  Musculoskeletal:     Right lower leg: Edema present.     Left lower leg: Edema present.  Skin:    General: Skin is warm and dry.  Neurological:     Mental Status: She is alert and oriented to person, place, and time.        CMP Latest Ref Rng & Units 08/08/2018  Glucose 70 - 99 mg/dL 92  BUN 8 - 23 mg/dL 17  Creatinine 0.44 - 1.00 mg/dL 0.87  Sodium 135 - 145 mmol/L 137  Potassium 3.5 - 5.1 mmol/L 4.1  Chloride 98 - 111 mmol/L 97(L)  CO2 22 - 32 mmol/L 29  Calcium 8.9 - 10.3 mg/dL 8.9  Total Protein 6.5 - 8.1 g/dL 7.2  Total Bilirubin 0.3 - 1.2 mg/dL 0.3  Alkaline Phos 38 - 126 U/L 49  AST 15 - 41 U/L 35  ALT 0 - 44 U/L 23   CBC Latest Ref Rng & Units 08/08/2018  WBC 4.0 - 10.5 K/uL 5.2  Hemoglobin 12.0 - 15.0 g/dL 10.8(L)  Hematocrit 36.0 - 46.0 % 33.8(L)  Platelets 150 - 400 K/uL 306    No  images are attached to the encounter.  Dg Chest 2 View  Result Date: 07/23/2018 CLINICAL DATA:  Fluid retention for 2 weeks. EXAM: CHEST - 2 VIEW COMPARISON:  Chest CT 07/12/2015 FINDINGS: The cardiac silhouette, mediastinal and hilar  contours are within normal limits and stable. The lungs are clear of an acute process. No pleural effusions. No worrisome pulmonary lesions. The right nipple shadows overlying the right lower chest. Remote surgical changes from a thoracic spine corpectomy and lateral fusion. No complicating features. IMPRESSION: No acute cardiopulmonary findings. Electronically Signed   By: Marijo Sanes M.D.   On: 07/23/2018 16:56   Ct Chest W Contrast  Result Date: 08/04/2018 CLINICAL DATA:  Diarrhea, abdominal distension, edema EXAM: CT CHEST, ABDOMEN, AND PELVIS WITH CONTRAST TECHNIQUE: Multidetector CT imaging of the chest, abdomen and pelvis was performed following the standard protocol during bolus administration of intravenous contrast. CONTRAST:  34m OMNIPAQUE IOHEXOL 300 MG/ML SOLN, additional oral enteric contrast COMPARISON:  None. FINDINGS: CT CHEST FINDINGS Cardiovascular: No significant vascular findings. Normal heart size. No pericardial effusion. Mediastinum/Nodes: No enlarged mediastinal, hilar, or axillary lymph nodes. Thyroid gland, trachea, and esophagus demonstrate no significant findings. Lungs/Pleura: Lungs are clear. No pleural effusion or pneumothorax. Musculoskeletal: No chest wall mass or suspicious bone lesions identified. Fusion of T7-T10. CT ABDOMEN PELVIS FINDINGS Hepatobiliary: No solid liver abnormality is seen. No gallstones, gallbladder wall thickening, or biliary dilatation. Pancreas: Unremarkable. No pancreatic ductal dilatation or surrounding inflammatory changes. Spleen: Normal in size without significant abnormality. Adrenals/Urinary Tract: Adrenal glands are unremarkable. Nonobstructive inferior pole left nephrolithiasis. Bladder is unremarkable. Stomach/Bowel: Stomach is within normal limits. Appendix is not clearly visualized and may be absent. No evidence of bowel wall thickening, distention, or inflammatory changes. Vascular/Lymphatic: No significant vascular findings are present.  There are numerous bulky retroperitoneal lymph nodes, largest left periaortic nodes or conglomerates near the superior mesenteric artery measuring at least 4.4 x 2.9 cm (series 2, image 64). Reproductive: There are cystic lesions of the enlarged bilateral ovaries, largest lesion on the left measuring at least 6.2 cm (series 2, image 116, series 6, image 76) Other: No abdominal wall hernia or abnormality. Moderate volume four quadrant ascites. Musculoskeletal: No acute or significant osseous findings. IMPRESSION: 1. There are cystic lesions of the enlarged bilateral ovaries, largest lesion on the left measuring at least 6.2 cm (series 2, image 116, series 6, image 76). Findings are highly concerning for ovarian neoplasm, particularly given presence of otherwise unexplained ascites and abdominal lymphadenopathy. 2. There are numerous bulky retroperitoneal lymph nodes, largest left periaortic nodes or conglomerates near the superior mesenteric artery measuring at least 4.4 x 2.9 cm (series 2, image 64), concerning for metastatic disease, or alternately primary malignancy such as lymphoma. 3.  Other chronic and incidental findings as detailed above. These results will be called to the ordering clinician or representative by the Radiologist Assistant, and communication documented in the PACS or zVision Dashboard. Electronically Signed   By: AEddie CandleM.D.   On: 08/04/2018 11:13   [place]oreaAbdomen Complete  Result Date: 07/23/2018 CLINICAL DATA:  Abdominal distension for 1 week with weight gain EXAM: ABDOMEN ULTRASOUND COMPLETE COMPARISON:  07/12/2015 CT chest, abdomen and pelvis. FINDINGS: Gallbladder: Mild diffuse gallbladder wall thickening. No gallbladder distention. No gallstones. No sonographic Murphy sign. Common bile duct: Diameter: 3 mm Liver: No focal lesion identified. No convincing liver surface irregularity. Normal liver parenchymal echogenicity and echotexture. Portal vein is patent on color Doppler imaging  with normal direction of blood  flow towards the liver. IVC: No abnormality visualized. Pancreas: Visualized portion unremarkable. Spleen: Size and appearance within normal limits. Right Kidney: Length: 8.2 cm. Echogenicity within normal limits. No mass or hydronephrosis visualized. Left Kidney: Length: 9.7 cm. Echogenicity within normal limits. No mass or hydronephrosis visualized. Abdominal aorta: No aneurysm visualized. Other findings: Large volume ascites, most prominent in the left lower quadrant. IMPRESSION: 1. Large volume ascites. 2. No overt morphologic changes of hepatic cirrhosis. 3. Normal size spleen. 4. Nonspecific mild diffuse gallbladder wall thickening, suspect noninflammatory edema. No cholelithiasis. No biliary ductal dilatation. Electronically Signed   By: Ilona Sorrel M.D.   On: 07/23/2018 19:45   Ct Abdomen Pelvis W Contrast  Result Date: 08/04/2018 CLINICAL DATA:  Diarrhea, abdominal distension, edema EXAM: CT CHEST, ABDOMEN, AND PELVIS WITH CONTRAST TECHNIQUE: Multidetector CT imaging of the chest, abdomen and pelvis was performed following the standard protocol during bolus administration of intravenous contrast. CONTRAST:  37m OMNIPAQUE IOHEXOL 300 MG/ML SOLN, additional oral enteric contrast COMPARISON:  None. FINDINGS: CT CHEST FINDINGS Cardiovascular: No significant vascular findings. Normal heart size. No pericardial effusion. Mediastinum/Nodes: No enlarged mediastinal, hilar, or axillary lymph nodes. Thyroid gland, trachea, and esophagus demonstrate no significant findings. Lungs/Pleura: Lungs are clear. No pleural effusion or pneumothorax. Musculoskeletal: No chest wall mass or suspicious bone lesions identified. Fusion of T7-T10. CT ABDOMEN PELVIS FINDINGS Hepatobiliary: No solid liver abnormality is seen. No gallstones, gallbladder wall thickening, or biliary dilatation. Pancreas: Unremarkable. No pancreatic ductal dilatation or surrounding inflammatory changes. Spleen: Normal  in size without significant abnormality. Adrenals/Urinary Tract: Adrenal glands are unremarkable. Nonobstructive inferior pole left nephrolithiasis. Bladder is unremarkable. Stomach/Bowel: Stomach is within normal limits. Appendix is not clearly visualized and may be absent. No evidence of bowel wall thickening, distention, or inflammatory changes. Vascular/Lymphatic: No significant vascular findings are present. There are numerous bulky retroperitoneal lymph nodes, largest left periaortic nodes or conglomerates near the superior mesenteric artery measuring at least 4.4 x 2.9 cm (series 2, image 64). Reproductive: There are cystic lesions of the enlarged bilateral ovaries, largest lesion on the left measuring at least 6.2 cm (series 2, image 116, series 6, image 76) Other: No abdominal wall hernia or abnormality. Moderate volume four quadrant ascites. Musculoskeletal: No acute or significant osseous findings. IMPRESSION: 1. There are cystic lesions of the enlarged bilateral ovaries, largest lesion on the left measuring at least 6.2 cm (series 2, image 116, series 6, image 76). Findings are highly concerning for ovarian neoplasm, particularly given presence of otherwise unexplained ascites and abdominal lymphadenopathy. 2. There are numerous bulky retroperitoneal lymph nodes, largest left periaortic nodes or conglomerates near the superior mesenteric artery measuring at least 4.4 x 2.9 cm (series 2, image 64), concerning for metastatic disease, or alternately primary malignancy such as lymphoma. 3.  Other chronic and incidental findings as detailed above. These results will be called to the ordering clinician or representative by the Radiologist Assistant, and communication documented in the PACS or zVision Dashboard. Electronically Signed   By: AEddie CandleM.D.   On: 08/04/2018 11:13   UKoreaParacentesis  Result Date: 07/29/2018 INDICATION: 63year old female with a history of recurrent ascites EXAM: ULTRASOUND  GUIDED  PARACENTESIS MEDICATIONS: None. COMPLICATIONS: None PROCEDURE: Informed written consent was obtained from the patient after a discussion of the risks, benefits and alternatives to treatment. A timeout was performed prior to the initiation of the procedure. Initial ultrasound scanning demonstrates a moderate amount of ascites within the right lower abdominal quadrant. The right lower abdomen  was prepped and draped in the usual sterile fashion. 1% lidocaine with epinephrine was used for local anesthesia. Following this, a 8 Fr Safe-T-Centesis catheter was introduced. An ultrasound image was saved for documentation purposes. The paracentesis was performed. The catheter was removed and a dressing was applied. The patient tolerated the procedure well without immediate post procedural complication. FINDINGS: A total of approximately 5 L of thin yellow fluid was removed. IMPRESSION: Successful status post ultrasound-guided paracentesis. Signed, Dulcy Fanny. Dellia Nims, RPVI [facility] Radiology Electronically Signed   By: Corrie Mckusick D.O.   On: 07/29/2018 11:31    Assessment and plan- Patient is a 63 y.o. 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  Based on peritoneal fluid cytology has most consistent with high-grade serous carcinoma and given the presence of bilateral enlarged ovaries this is likely ovarian in origin.  Clinically this is at least a stage III T3N1 disease.  There is no evidence of distant metastatic disease or liver metastases and this may be resectable upfront surgically.  However patient is thin and cachectic and her albumin is low at 3.1.  Patient was also noted to have macrocytic anemia and I have therefore checked  iron studies B12 and folate as well as TSH today.  Patient  noted to have significantly elevated TSH of 46 with a low T3 of 1.1.  Patient does follow-up with Dr. Gabriel Carina for her thyroid issues and we will get in touch with her regarding her values from today.  I have strongly recommended that patient should be seen ASAP by GYN oncology and only be taken see her here at [facility] would be Wednesday.  I have given her a note to her employer to be excused from work on Wednesday to be able to be seen by them.  If not deemed to be an upfront surgical candidate there would be a role for neoadjuvant chemotherapy with 3 cycles of carboplatin and Taxol plus minus a Avastin followed by repeat scanning and consideration for interval surgery.  She will then need 3 more cycles of chemotherapy adjuvantly for a total of 6 cycles.  At this time I will await further recommendations from GYN oncology before making any plans for neoadjuvant chemotherapy.  Treatment will be given with the potential curative intent.  I will discuss chemotherapy in further detail after her appointment with GYN oncology.  Baseline CBC with differential, CMP and Ca1 25 checked today  We will also make referral for genetic counseling given her history of ovarian carcinoma.  No known history of breast cancer or ovarian cancer in the family.  Patient does not have any children of her own.   Total face to face encounter time for this patient visit was 60 min. >50% of the time was  spent in counseling and coordination of care.   Thank you for this kind referral and the opportunity to participate in the care of this patient   Visit Diagnosis 1. Acquired hypothyroidism   2. Goals of care, counseling/discussion   3. Macrocytic anemia   4. Primary high grade serous adenocarcinoma of ovary ([place])     Dr. Randa Evens, MD, MPH [facility] at [facility] [phone] 08/11/2018 7:50 AM

## 2018-08-11 NOTE — Telephone Encounter (Signed)
Called patient to schedule to see gyn-onc this week. She has not yet returned previous calls or messages. Phone went to voicemail. Message left asking patient to return call. Would tentatively plan to see patient on 7/1 at 4pm with Dr. Theora Gianotti.

## 2018-08-12 ENCOUNTER — Telehealth: Payer: Self-pay | Admitting: Physician Assistant

## 2018-08-12 DIAGNOSIS — E7800 Pure hypercholesterolemia, unspecified: Secondary | ICD-10-CM

## 2018-08-12 MED ORDER — SIMVASTATIN 20 MG PO TABS: 20.0000 mg | ORAL_TABLET | Freq: Every day | ORAL | 1 refills | Status: DC

## 2018-08-12 NOTE — Telephone Encounter (Signed)
Pt needs refill on Simvastatin 20 mg  Medical [employer]

## 2018-08-12 NOTE — Telephone Encounter (Signed)
Beckey Rutter NP at [facility] called wanting to speak to [facility] regarding pt's new diagnosis of Cancer.  She is concerned because she can not get in touch with this patient.  CB# is  [phone]    Thanks Con Memos

## 2018-08-12 NOTE — Telephone Encounter (Signed)
Spoke with [employer], Patient's SO. He reports they are aware of the appointment for tomorrow at 4pm and do plan on going to this appt. He does report patient has been doing ok mentally at this time, she has just been working a lot and that is why she has canceled her appointments and not been able to answer her phone.

## 2018-08-12 NOTE — Telephone Encounter (Signed)
L.O.V. 11/06/2017 and no upcoming appointment.

## 2018-08-13 ENCOUNTER — Ambulatory Visit: Payer: Commercial Managed Care - PPO

## 2018-08-13 ENCOUNTER — Inpatient Hospital Stay: Payer: Commercial Managed Care - PPO | Attending: Obstetrics and Gynecology | Admitting: Obstetrics and Gynecology

## 2018-08-13 ENCOUNTER — Other Ambulatory Visit: Payer: Commercial Managed Care - PPO

## 2018-08-13 ENCOUNTER — Other Ambulatory Visit: Payer: Self-pay

## 2018-08-13 DIAGNOSIS — E538 Deficiency of other specified B group vitamins: Secondary | ICD-10-CM | POA: Diagnosis not present

## 2018-08-13 DIAGNOSIS — C561 Malignant neoplasm of right ovary: Secondary | ICD-10-CM | POA: Insufficient documentation

## 2018-08-13 DIAGNOSIS — R809 Proteinuria, unspecified: Secondary | ICD-10-CM | POA: Diagnosis not present

## 2018-08-13 DIAGNOSIS — Z79899 Other long term (current) drug therapy: Secondary | ICD-10-CM | POA: Insufficient documentation

## 2018-08-13 DIAGNOSIS — Z515 Encounter for palliative care: Secondary | ICD-10-CM | POA: Insufficient documentation

## 2018-08-13 DIAGNOSIS — Z5111 Encounter for antineoplastic chemotherapy: Secondary | ICD-10-CM | POA: Insufficient documentation

## 2018-08-13 DIAGNOSIS — Z7984 Long term (current) use of oral hypoglycemic drugs: Secondary | ICD-10-CM | POA: Diagnosis not present

## 2018-08-13 DIAGNOSIS — E7800 Pure hypercholesterolemia, unspecified: Secondary | ICD-10-CM | POA: Insufficient documentation

## 2018-08-13 DIAGNOSIS — E785 Hyperlipidemia, unspecified: Secondary | ICD-10-CM | POA: Diagnosis not present

## 2018-08-13 DIAGNOSIS — R188 Other ascites: Secondary | ICD-10-CM

## 2018-08-13 DIAGNOSIS — Z7689 Persons encountering health services in other specified circumstances: Secondary | ICD-10-CM | POA: Insufficient documentation

## 2018-08-13 DIAGNOSIS — E1136 Type 2 diabetes mellitus with diabetic cataract: Secondary | ICD-10-CM | POA: Diagnosis not present

## 2018-08-13 DIAGNOSIS — Z7982 Long term (current) use of aspirin: Secondary | ICD-10-CM | POA: Diagnosis not present

## 2018-08-13 DIAGNOSIS — I10 Essential (primary) hypertension: Secondary | ICD-10-CM | POA: Diagnosis not present

## 2018-08-13 DIAGNOSIS — Z5112 Encounter for antineoplastic immunotherapy: Secondary | ICD-10-CM | POA: Diagnosis not present

## 2018-08-13 DIAGNOSIS — C562 Malignant neoplasm of left ovary: Secondary | ICD-10-CM | POA: Insufficient documentation

## 2018-08-13 DIAGNOSIS — J309 Allergic rhinitis, unspecified: Secondary | ICD-10-CM | POA: Diagnosis not present

## 2018-08-13 DIAGNOSIS — R634 Abnormal weight loss: Secondary | ICD-10-CM | POA: Diagnosis not present

## 2018-08-13 DIAGNOSIS — E119 Type 2 diabetes mellitus without complications: Secondary | ICD-10-CM | POA: Diagnosis not present

## 2018-08-13 DIAGNOSIS — D649 Anemia, unspecified: Secondary | ICD-10-CM | POA: Insufficient documentation

## 2018-08-13 DIAGNOSIS — R600 Localized edema: Secondary | ICD-10-CM | POA: Insufficient documentation

## 2018-08-13 DIAGNOSIS — R590 Localized enlarged lymph nodes: Secondary | ICD-10-CM | POA: Insufficient documentation

## 2018-08-13 DIAGNOSIS — R180 Malignant ascites: Secondary | ICD-10-CM | POA: Diagnosis not present

## 2018-08-13 DIAGNOSIS — E039 Hypothyroidism, unspecified: Secondary | ICD-10-CM | POA: Diagnosis not present

## 2018-08-13 DIAGNOSIS — C801 Malignant (primary) neoplasm, unspecified: Secondary | ICD-10-CM

## 2018-08-13 NOTE — Progress Notes (Signed)
Gynecologic Oncology Consult Visit   Referring Provider: Dr. Janese Banks  Chief Complaint: high grade serous adenocarcinoma  Subjective:  Meredith Jones is a 63 y.o. GOPO female who is seen in consultation from Dr. Janese Banks for new diagnosis of high grade serous adenocarcinoma.   She presented to ER on 07/23/2018 with symptoms of bilateral lower extremity swelling as well as abdominal distention. US abdomen revealed large volume ascites, no overt changes of hepatic cirrhosis and was referred to Dr. Tama Headings. She underwent US paracentesis on 07/29/2018. 5L of fluid was removed.   Pathology:  A. ABDOMINAL FLUID; PARACENTESIS:  - MALIGNANT CELLS PRESENT.  - ADENOCARCINOMA, MOST CONSISTENT WITH HIGH-GRADE SEROUS CARCINOMA.   08/04/2018- CT C/A/P IMPRESSION: 1. There are cystic lesions of the enlarged bilateral ovaries, largest lesion on the left measuring at least 6.2 cm (series 2, image 116, series 6, image 76). Findings are highly concerning for ovarian neoplasm, particularly given presence of otherwise unexplained ascites and abdominal lymphadenopathy.  2. There are numerous bulky retroperitoneal lymph nodes, largest left periaortic nodes or conglomerates near the superior mesenteric artery measuring at least 4.4 x 2.9 cm (series 2, image 64), concerning for metastatic disease, or alternately primary malignancy such as lymphoma.  Nonobstructive inferior pole left nephrolithiasis. Fusion of T7-T10.   She works as a Scientist, water quality.  She lives with her boyfriend.  She reports about 35 pound weight loss over the last 4 to 5 months but more significant weight loss of past several years.  Reports that her appetite is good and she is eating well but continues to lose weight.  Denies any pain at this time.  Reports ongoing fatigue. She saw Dr. Laurey Morale in 2013 for possible endometrial hyperplasia and underwent D/C. Very narrow vagina making visualization of cervix 'almost impossible'.   She saw Dr. Janese Banks on 08/08/2018.  Noted to have macrocytic anemia and found to have low B12 levels. TSH- 46.46; FT3 1.1, FT4 0.74. She has seen her endocrinologist and adjusted her levothyroxine. History of type 2 diabetes. Last hA1c was 6.5 (08/04/2018).   CA 125 08/08/2018 491  08/08/2018- albumin 3.1, hemoglobin 10.8, hct 33.8.    She presents today to discuss neoadjuvant chemotherapy vs primary debulking surgery.    Problem List: Patient Active Problem List   Diagnosis Date Noted  . Serous adenocarcinoma ([place]) 08/13/2018  . Goals of care, counseling/discussion 08/11/2018  . Loss of weight   . Benign neoplasm of descending colon   . Type 2 diabetes mellitus without complication, without long-term current use of insulin ([place]) 06/07/2015  . 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 Cyst of ovary 06/09/2014  . Bulky or enlarged uterus 06/09/2014  . Hypercholesteremia 02/09/2007  . Allergic rhinitis 11/27/2006  . Endometriosis 11/27/2006  . Adult hypothyroidism 11/27/2006    Past Medical History: Past Medical History:  Diagnosis Date  . Allergy   . Diabetes mellitus without complication ([place])   . Diverticulosis   . Hyperlipidemia   . Hypothyroidism   . Thyroid disease     Past Surgical History: Past Surgical History:  Procedure Laterality Date  . BACK SURGERY  2007   Lumbar Surgery  . BREAST CYST ASPIRATION Right    Around 10 years ago. Pt thinks it was the right breast but not sure.  Marland Kitchen CATARACT EXTRACTION Left 2010   Right Eye 2011  . COLONOSCOPY WITH PROPOFOL N/A 09/02/2015   Procedure: COLONOSCOPY WITH PROPOFOL;  Surgeon: [name], MD;  Location: [place];  Service: Endoscopy;  Laterality: N/A;  Diabetic - oral meds  . ESOPHAGOGASTRODUODENOSCOPY (EGD) WITH PROPOFOL N/A 11/03/2015   Procedure: ESOPHAGOGASTRODUODENOSCOPY (EGD) WITH PROPOFOL;  Surgeon: [name], MD;  Location: [place];  Service: Endoscopy;  Laterality: N/A;  . EYE SURGERY Bilateral 06/2014    tear duck  catherization ( Dr. Vallarie Mare)  . POLYPECTOMY  09/02/2015   Procedure: POLYPECTOMY;  Surgeon: [name], MD;  Location: [place];  Service: Endoscopy;;  . TONSILLECTOMY  1963    Past Gynecologic History:  As per HPI  OB History: P0 OB History  No obstetric history on file.    Family History: Family History  Problem Relation Age of Onset  . Congestive Heart Failure Mother   . Diabetes Mother   . Stroke Mother   . Hypothyroidism Father   . Cancer Father        lung cancer  . Cancer Paternal Grandmother        stomach    Social History: Social History   Socioeconomic History  . Marital status: Single    Spouse name: Not on file  . Number of children: Not on file  . Years of education: Not on file  . Highest education level: Not on file  Occupational History  . Not on file  Social Needs  . Financial resource strain: Not on file  . Food insecurity    Worry: Not on file    Inability: Not on file  . Transportation needs    Medical: Not on file    Non-medical: Not on file  Tobacco Use  . Smoking status: Never Smoker  . Smokeless tobacco: Never Used  Substance and Sexual Activity  . Alcohol use: No  . Drug use: No  . Sexual activity: Not on file  Lifestyle  . Physical activity    Days per week: Not on file    Minutes per session: Not on file  . Stress: Not on file  Relationships  . Social Herbalist on phone: Not on file    Gets together: Not on file    Attends religious service: Not on file    Active member of club or organization: Not on file    Attends meetings of clubs or organizations: Not on file    Relationship status: Not on file  . Intimate partner violence    Fear of current or ex partner: Not on file    Emotionally abused: Not on file    Physically abused: Not on file    Forced sexual activity: Not on file  Other Topics Concern  . Not on file  Social History Narrative  . Not on file    Allergies: Allergies   Allergen Reactions  . Clarithromycin Nausea Only  . Doxycycline     nausea, dizziness.  Marland Kitchen Penicillins Hives    Current Medications: Current Outpatient Medications  Medication Sig Dispense Refill  . calcipotriene (DOVONOX) 0.005 % ointment CALCIPOTRIENE, 0.005% (External Ointment) - Historical Medication  apply to skin daily (0.005 %) Active    . clobetasol ointment (TEMOVATE) 0.05 % CLOBETASOL PROPIONATE, 0.05% (External Ointment) - Historical Medication  apply to skin daily (0.05 %) Active    . dexamethasone (DECADRON) 0.5 MG/5ML solution Take 0.5 mg by mouth daily. Use daily one week then off one week    . furosemide (LASIX) 20 MG tablet Take 1 tablet (20 mg total) by mouth daily. 20 tablet 0  . glipiZIDE (GLUCOTROL) 5 MG tablet TAKE 1 TABLET BY MOUTH DAILY BEFORE  BREAKFAST 90 tablet 1  . glucose blood (ONE TOUCH ULTRA TEST) test strip USE TO TEST BLOOD SUGAR TWICE A DAY AS INSTRUCTED 100 each 12  . levothyroxine (SYNTHROID) 100 MCG tablet Take 1 tablet (100 mcg total) by mouth daily. 90 tablet 1  . metFORMIN (GLUCOPHAGE) 500 MG tablet Take 2 tablets (1,000 mg total) by mouth 2 (two) times daily. 360 tablet 1  . MULTIPLE VITAMIN PO MULTIVITAMINS (Oral Tablet)  1 Every Day for 0 days  Quantity: 0.00;  Refills: 0   Ordered :02-Oct-2010  Doy Hutching ;  Started 12-Mar-2007 Active    . ONETOUCH DELICA LANCETS 16S MISC USE TO TEST BLOOD SUGAR ONCE A DAY 100 each 12  . simvastatin (ZOCOR) 20 MG tablet Take 1 tablet (20 mg total) by mouth at bedtime. 90 tablet 1  . spironolactone (ALDACTONE) 50 MG tablet Take 1 tablet (50 mg total) by mouth daily. 20 tablet 0  . aspirin 81 MG tablet ASPIRIN, 81MG  (Oral Tablet Delayed Release)  1 a week for 0 days  Quantity: 0.00;  Refills: 0   Ordered :02-Oct-2010  Doy Hutching ;  Started 12-Mar-2007 Active    . fexofenadine (ALLEGRA) 60 MG tablet Take 60 mg by mouth daily. Reported on 09/01/2015     No current facility-administered medications for this  visit.     Review of Systems General: fatigue  HEENT: no complaints  Lungs: no complaints  Cardiac: no complaints  GI: abdominal swelling  GU: urinary incontinence since 2016  Musculoskeletal: chronic back pain  Extremities: bilateral lymphedemia  Skin: lesions chronic began with leg swelling  Neuro: no complaints  Endocrine: no complaints  Psych: no complaints        Objective:  Physical Examination:  BP 99/68 (BP Location: Left Arm, Patient Position: Sitting)   Pulse 98   Temp 99.6 F (37.6 C) (Tympanic)   Resp (!) 8   Ht 5\' 3"  (1.6 m)   Wt 117 lb 14.4 oz (53.5 kg)   SpO2 96%   BMI 20.89 kg/m     ECOG Performance Status: 1 - Symptomatic but completely ambulatory she works full time   General appearance: alert, cooperative, appears older than stated age and cachectic HEENT: ATNC Lymph node survey: non-palpable, axillary, inguinal, supraclavicular Cardiovascular: regulatr rate and rhythm Respiratory: Bilateral clear to ausculation Abdomen: very distended with ascites; no palpable masses but unable to determine due to ascites. No evidence of hepatomegaly or splenomegaly.  Extremities:bilateral lymphedema and diffiuse skin lesion and echymosis Neurological exam reveals alert, oriented, normal speech, no focal findings or movement disorder noted  Pelvic: chaperoned by NP EGBUS: no lesions Cervix: no lesions, nontender, mobile Vagina: no lesions, no discharge or bleeding.  Uterus: unable to determine size given ascites. No evidence of enlargement Adnexa: no palpable masses but very limited exam due to ascites and narrow vaginal caliber. Rectovaginal: confirmatory  Lab Review Labs on site today: Lab Results  Component Value Date   WBC 5.2 08/08/2018   HGB 10.8 (L) 08/08/2018   HCT 33.8 (L) 08/08/2018   MCV 101.5 (H) 08/08/2018   PLT 306 08/08/2018      Chemistry      Component Value Date/Time   NA 137 08/08/2018 1053   NA 142 05/31/2017 0836   K 4.1  08/08/2018 1053   CL 97 (L) 08/08/2018 1053   CO2 29 08/08/2018 1053   BUN 17 08/08/2018 1053   BUN 9 05/31/2017 0836   CREATININE 0.87 08/08/2018 1053   CREATININE  0.57 11/13/2016 0933   GLU 89 03/25/2013      Component Value Date/Time   CALCIUM 8.9 08/08/2018 1053   ALKPHOS 49 08/08/2018 1053   AST 35 08/08/2018 1053   ALT 23 08/08/2018 1053   BILITOT 0.3 08/08/2018 1053   BILITOT <0.2 05/31/2017 0836     Albumin 3.1 on 08/08/2018  Radiologic Imaging: As per HPI    Assessment:  Meredith Jones is a 63 y.o. 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 Bulky retroperitoneal adenopathy adjacent and surrounding superior mesenteric artery. Hypoalbuminemia. Tense ascites. Excellent performance status.   Mild anemia.   Medical co-morbidities complicating care:Diabetes mellitus without complication ([place]), Hyperlipidemia, Hypothyroid disease. Body mass index is 20.89 kg/m.   Plan:   Problem List Items Addressed This Visit      Other   Serous adenocarcinoma ([place])      We discussed typical options for management including neoadjuvant chemotherapy vs primary debulking surgery. Given the precarious location of the bulky retroperitoneal disease adjacent to the superior mesenteric artery I have recommended neoadjuvant chemotherapy. We defer the choice of chemotherapy and addition of bevacizumab to Dr Janese Banks. Dr. Janese Banks is willing to add her on to her schedule tomorrow which is wonderful given the extent of her disease. Recommend repeat CT scan after 3 cycles and we would like her to return to clinic at that time to review the findings and determine if surgery is an option at that time versus further chemotherapy.   We recommend germline testing and somatic tumor testing. Somatic tumor testing will be deferred to her interval surgery.   She agrees with this plan.   The patient's diagnosis, an outline of the further diagnostic and laboratory  studies which will be required, the recommendation for surgery, and alternatives were discussed with her and her accompanying family members.  All questions were answered to their satisfaction.  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  I personally had a face to face interaction and evaluated the patient jointly with the NP, Ms. Beckey Rutter.  I have reviewed her history and available records and have performed the key portions of the physical exam including  lymph node survey, abdominal exam, pelvic exam with my findings confirming those documented above by the APP.  I have discussed the case with the APP and the patient.  I agree with the above documentation, assessment and plan which was fully formulated by me.  Counseling was completed by me.   I personally saw the patient and performed a substantive portion of this encounter in conjunction with the listed APP as documented above.  Angeles Gaetana Michaelis, MD       CC:  Dr. Janese Banks

## 2018-08-13 NOTE — Patient Instructions (Addendum)
Ovarian Cancer  Ovarian cancer is an abnormal growth of cells that forms a mass (malignant tumor) on one or both ovaries. The ovaries are the parts of the female reproductive system that produce eggs. Women have two ovaries. They are located on either side of the uterus. What are the causes? The cause of ovarian cancer is not known. What increases the risk? You are more likely to develop this condition if you:  Are 104 or older.  Have a personal or family history of endometrial, colon, breast, or ovarian cancer.  Have the genes associated with breast and ovarian cancer (BRCA1 and BRCA2).  Have used fertility medicines.  Started menstruating before age 32.  Started menopause when you were older than 74.  Became pregnant for the first time at age 23 or older.  Have never been pregnant.  Have had hormone replacement therapy.  Are overweight.  Have certain inherited genetic conditions that raise the risk of cancer, such as Lynch syndrome.  Have a history of polycystic ovarian syndrome.  Have tissues from the uterus growing outside of the uterus (endometriosis). What are the signs or symptoms? In the early stages, ovarian cancer often does not cause symptoms. As the cancer grows, symptoms may include:  Unexplained weight loss.  Pain, swelling, and bloating in the abdomen.  Pain and pressure in your back and pelvis.  Abnormal bleeding from the vagina.  Loss of appetite.  Passing urine often.  Pain during sex.  Fatigue. How is this diagnosed? This condition may be diagnosed based on:  Your medical history and a physical exam.  A pelvic and abdominal exam to check your ovaries, uterus, vulva, cervix, vagina, bladder, rectum, and fallopian tubes.  Tests, such as: ? An imaging test that uses sound waves to take pictures of your uterus, bladder, ovaries, and fallopian tubes (transvaginal ultrasound). For this test, a sound wave probe is inserted into your  vagina. ? CT scan, PET scan, or MRI. ? X-rays of the colon and rectum. ? Blood tests. ? Taking a small piece of tissue to examine it under a microscope (biopsy). ? Removing built-up fluid from the abdomen (ascites) to be examined under a microscope (paracentesis). Your cancer will be assessed (staged) based on how severe it is and how much it has spread. How is this treated? This condition may be treated with one or more of the following:  Surgery to remove one ovary and its fallopian tube (oophorectomy). This may be done to treat cancer in its early stages.  Surgery to remove the uterus, cervix, fallopian tubes, and ovaries (hysterectomy with bilateral salpingo-oophorectomy). Lymph tissue (lymph nodes) near the tumor and some tissue and fluid from the abdomen may also be removed and analyzed for cancer cells. This is done to treat advanced cancer.  Chemotherapy. This uses medicines to kill the cancer cells. Chemotherapy may be used before or after surgery.  Intraperitoneal chemotherapy (IP chemotherapy). Chemotherapy is given directly into the abdomen (peritoneum) through a special tube.  Targeted therapy. This uses drugs to attack specific areas within cancer cells to kill the cells or stop them from growing. Follow these instructions at home: Lifestyle  Do not use any products that contain nicotine or tobacco, such as cigarettes and e-cigarettes. If you need help quitting, ask your health care provider.  Do moderate exercise regularly, as told by your health care provider.  Try to eat regular, healthy meals. Some of your treatments might affect your appetite. If you are having problems eating  or with your appetite, ask to meet with a food and nutrition specialist (dietitian).  Consider joining a support group with others who have cancer. A support group may help you with resources and information to help you cope with your cancer. General instructions  Take over-the-counter and  prescription medicines only as told by your health care provider.  Do not drive or use heavy machinery while taking prescription pain medicine.  If you are taking prescription pain medicine, take actions to prevent or treat constipation. Your health care provider may recommend that you: ? Drink enough fluid to keep your urine pale yellow. ? Eat foods that are high in fiber, such as fresh fruits and vegetables, whole grains, and beans. ? Limit foods that are high in fat and processed sugars, such as fried or sweet foods. ? Take an over-the-counter or prescription medicine for constipation.  You may need to have regular blood tests and imaging tests to monitor your response to treatment.  Keep all follow-up visits as told by your health care provider. This is important. Where to find more information  American Cancer Society: www.cancer.[place]: www.cancer.gov Contact a health care provider if you:  Are not able to follow a prescribed treatment plan or take a medicine.  Have any symptoms or changes that concern you.  Have changes in your bowel or bladder habits. Get help right away if you have:  A fever or chills.  Serious side effects or an allergic reaction to a treatment or medicine.  Back pain that is new.  Increased pain, swelling, or bloating in your abdomen. Summary  Ovarian cancer is an abnormal growth of cells that forms a mass (malignant tumor) on one or both ovaries.  Symptoms of ovarian cancer may include pain in the abdomen, back, or pelvis. You may also have a loss of appetite and swelling or bloating in the abdomen.  You are more likely to develop ovarian cancer if you are older than 31, but it can affect younger women, particularly those with the BRCA1 or BRCA2 genes. These genes are associated with breast and ovarian cancer.  Treatment may include a combination of surgery and medicines that kill cancer cells (chemotherapy).  You may  need to have regular blood tests and imaging tests to monitor your response to treatment. This information is not intended to replace advice given to you by your health care provider. Make sure you discuss any questions you have with your health care provider. Document Released: 12/20/2003 Document Revised: 05/22/2018 Document Reviewed: 02/20/2017 Elsevier Patient Education  2020 [employer].

## 2018-08-14 ENCOUNTER — Other Ambulatory Visit: Payer: Self-pay

## 2018-08-14 ENCOUNTER — Telehealth: Payer: Self-pay | Admitting: Oncology

## 2018-08-14 ENCOUNTER — Inpatient Hospital Stay (HOSPITAL_BASED_OUTPATIENT_CLINIC_OR_DEPARTMENT_OTHER): Payer: Commercial Managed Care - PPO | Admitting: Oncology

## 2018-08-14 ENCOUNTER — Ambulatory Visit: Payer: Commercial Managed Care - PPO

## 2018-08-14 ENCOUNTER — Ambulatory Visit: Payer: Commercial Managed Care - PPO | Admitting: Oncology

## 2018-08-14 ENCOUNTER — Encounter: Payer: Self-pay | Admitting: Oncology

## 2018-08-14 VITALS — BP 105/72 | HR 103 | Temp 98.2°F | Resp 18 | Wt 118.2 lb

## 2018-08-14 DIAGNOSIS — C801 Malignant (primary) neoplasm, unspecified: Secondary | ICD-10-CM | POA: Diagnosis not present

## 2018-08-14 DIAGNOSIS — C562 Malignant neoplasm of left ovary: Secondary | ICD-10-CM

## 2018-08-14 DIAGNOSIS — Z7189 Other specified counseling: Secondary | ICD-10-CM

## 2018-08-14 DIAGNOSIS — C561 Malignant neoplasm of right ovary: Secondary | ICD-10-CM

## 2018-08-14 NOTE — Telephone Encounter (Signed)
Spoke with PT. She said that she would be at her appt on 08/13/2018 whenever she was allowed to get off work per her job. I told her that her appt was at 4:00 and she said she would be there when her work allowed her to come.

## 2018-08-14 NOTE — Progress Notes (Signed)
Patient here for follow up. States she has diarrhea at times which she takes immodium for.

## 2018-08-17 MED ORDER — DEXAMETHASONE 4 MG PO TABS: 8.0000 mg | ORAL_TABLET | Freq: Every day | ORAL | 1 refills | Status: DC

## 2018-08-17 MED ORDER — LORAZEPAM 0.5 MG PO TABS: 0.5000 mg | ORAL_TABLET | Freq: Four times a day (QID) | ORAL | 0 refills | Status: DC | PRN

## 2018-08-17 MED ORDER — ONDANSETRON HCL 8 MG PO TABS: 8.0000 mg | ORAL_TABLET | Freq: Two times a day (BID) | ORAL | 1 refills | Status: DC | PRN

## 2018-08-17 MED ORDER — PROCHLORPERAZINE MALEATE 10 MG PO TABS: 10.0000 mg | ORAL_TABLET | Freq: Four times a day (QID) | ORAL | 1 refills | Status: DC | PRN

## 2018-08-17 MED ORDER — LIDOCAINE-PRILOCAINE 2.5-2.5 % EX CREA: TOPICAL_CREAM | CUTANEOUS | 3 refills | Status: DC

## 2018-08-17 NOTE — Progress Notes (Signed)
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  She presented with symptoms of bilateral lower extremity swelling as well as abdominal distention.GI.  This was followed by a CT abdomen and chest imaging with contrast.  CT showed cystic lesions of the enlarged bilateral ovaries the largest lesion on the left measuring 6.2 cm.  Patient also noted to have numerous bulky retroperitoneal lymph nodes, largest left periaortic node or conglomerates near the superior mesenteric artery measuring at least 4.4 x 2.9 cm.  Significant ascites was present.  Patient underwent paracentesis and cytology from the fluid showed high-grade serous carcinoma.  Tumor cells were positive for CK7, PAX 8, p53 and WT 1.  Patient seen by GYN onc and neoadjuvant chemotherapy recommended due to buly retroperitoneal adenopathy  Interval history- no acute changes since last visit. Continues to have fatigue and leg swelling  ECOG PS- 1 Pain scale- 0 Opioid associated constipation- no  Review of systems- Review of Systems  Constitutional: Positive for malaise/fatigue and weight loss. Negative for chills and fever.  HENT: Negative for congestion, ear discharge and nosebleeds.   Eyes: Negative for blurred vision.  Respiratory: Negative for cough, hemoptysis, sputum production, shortness of breath and wheezing.   Cardiovascular: Positive for leg  swelling. Negative for chest pain, palpitations, orthopnea and claudication.  Gastrointestinal: Negative for abdominal pain, blood in stool, constipation, diarrhea, heartburn, melena, nausea and vomiting.  Genitourinary: Negative for dysuria, flank pain, frequency, hematuria and urgency.  Musculoskeletal: Negative for back pain, joint pain and myalgias.  Skin: Negative for rash.  Neurological: Negative for dizziness, tingling, focal weakness, seizures, weakness and headaches.  Endo/Heme/Allergies: Does not bruise/bleed easily.  Psychiatric/Behavioral: Negative for depression and suicidal ideas. The patient does not have insomnia.       Allergies  Allergen Reactions   Clarithromycin Nausea Only   Doxycycline     nausea, dizziness.   Penicillins Hives     Past Medical History:  Diagnosis Date   Allergy    Diabetes mellitus without complication ([place])    Diverticulosis    Hyperlipidemia    Hypothyroidism    Thyroid disease      Past Surgical History:  Procedure Laterality Date   BACK SURGERY  2007   Lumbar Surgery   BREAST CYST ASPIRATION Right    Around 10 years ago. Pt thinks it was the right breast but not sure.   CATARACT EXTRACTION Left 2010   Right Eye 2011   COLONOSCOPY WITH PROPOFOL N/A 09/02/2015   Procedure: COLONOSCOPY WITH PROPOFOL;  Surgeon: [name], MD;  Location: [place];  Service: Endoscopy;  Laterality: N/A;  Diabetic - oral meds   ESOPHAGOGASTRODUODENOSCOPY (EGD) WITH PROPOFOL N/A 11/03/2015   Procedure: ESOPHAGOGASTRODUODENOSCOPY (EGD) WITH PROPOFOL;  Surgeon: [name], MD;  Location: [place];  Service: Endoscopy;  Laterality: N/A;   EYE SURGERY Bilateral 06/2014    tear duck catherization ( Dr. Vallarie Mare)   POLYPECTOMY  09/02/2015   Procedure: POLYPECTOMY;  Surgeon: [name], MD;  Location: [place];  Service: Endoscopy;;   TONSILLECTOMY  1963    Social History   Socioeconomic History    Marital status: Single    Spouse name: Not on file   Number of children: Not on file   Years of education: Not on file   Highest education level: Not on file  Occupational History   Not on file  Social Needs   Financial resource strain: Not on file   Food insecurity    Worry: Not on file    Inability: Not on file   Transportation needs    Medical: Not on file    Non-medical: Not on file  Tobacco Use   Smoking status: Never Smoker   Smokeless tobacco: Never Used  Substance and Sexual Activity   Alcohol use: No   Drug use: No   Sexual activity: Not on file  Lifestyle   Physical activity    Days per week: Not on file    Minutes per session: Not on file   Stress: Not on file  Relationships   Social connections    Talks on phone: Not on file    Gets together: Not on file    Attends religious service: Not on file    Active member of club or organization: Not on file    Attends meetings of clubs or organizations: Not on file    Relationship status: Not on file   Intimate partner violence    Fear of current or ex partner: Not on file    Emotionally abused: Not on file    Physically abused: Not on file    Forced sexual activity: Not on file  Other Topics Concern   Not on file  Social History Narrative   Not on file    Family History  Problem Relation Age of Onset   Congestive Heart Failure Mother    Diabetes Mother    Stroke Mother    Hypothyroidism Father    Cancer Father        lung cancer   Cancer Paternal Grandmother        stomach     Current Outpatient Medications:    aspirin 81 MG tablet, ASPIRIN, '81MG'$  (Oral Tablet Delayed Release)  1 a week for 0 days  Quantity: 0.00;  Refills: 0   Ordered :02-Oct-2010  Doy Hutching ;  Started 12-Mar-2007 Active, Disp: , Rfl:    clobetasol ointment (TEMOVATE) 0.05 %, CLOBETASOL PROPIONATE, 0.05% (External Ointment) - Historical Medication  apply to skin daily (0.05 %) Active, Disp: , Rfl:     dexamethasone (DECADRON) 0.5 MG/5ML solution, Take 0.5 mg by mouth daily. Use daily one week then off one week, Disp: , Rfl:    fexofenadine (ALLEGRA) 60 MG tablet, Take 60 mg by mouth daily. Reported on 09/01/2015, Disp: , Rfl:    furosemide (LASIX) 20 MG tablet, Take 1 tablet (20 mg total) by mouth daily., Disp: 20 tablet, Rfl: 0   glipiZIDE (GLUCOTROL) 5 MG tablet, TAKE 1 TABLET BY MOUTH DAILY BEFORE BREAKFAST, Disp: 90 tablet, Rfl: 1   glucose blood (ONE TOUCH ULTRA TEST) test strip, USE TO TEST BLOOD SUGAR TWICE A DAY AS INSTRUCTED, Disp: 100 each, Rfl: 12   levothyroxine (SYNTHROID) 100 MCG tablet, Take 1 tablet (100 mcg total) by mouth daily., Disp: 90 tablet, Rfl: 1   metFORMIN (GLUCOPHAGE) 500 MG tablet, Take 2 tablets (1,000 mg total) by mouth 2 (two) times daily., Disp: 360 tablet, Rfl: 1   MULTIPLE  VITAMIN PO, MULTIVITAMINS (Oral Tablet)  1 Every Day for 0 days  Quantity: 0.00;  Refills: 0   Ordered :02-Oct-2010  Doy Hutching ;  Started 12-Mar-2007 Active, Disp: , Rfl:    ONETOUCH DELICA LANCETS 24M MISC, USE TO TEST BLOOD SUGAR ONCE A DAY, Disp: 100 each, Rfl: 12   simvastatin (ZOCOR) 20 MG tablet, Take 1 tablet (20 mg total) by mouth at bedtime., Disp: 90 tablet, Rfl: 1   spironolactone (ALDACTONE) 50 MG tablet, Take 1 tablet (50 mg total) by mouth daily., Disp: 20 tablet, Rfl: 0   calcipotriene (DOVONOX) 0.005 % ointment, CALCIPOTRIENE, 0.005% (External Ointment) - Historical Medication  apply to skin daily (0.005 %) Active, Disp: , Rfl:   Physical exam:  Vitals:   08/14/18 1424  BP: 105/72  Pulse: (!) 103  Resp: 18  Temp: 98.2 F (36.8 C)  Weight: 118 lb 3.2 oz (53.6 kg)   Physical Exam Constitutional:      General: She is not in acute distress.    Comments: Thin and cachectic  HENT:     Head: Normocephalic and atraumatic.  Eyes:     Pupils: Pupils are equal, round, and reactive to light.  Neck:     Musculoskeletal: Normal range of motion.    Cardiovascular:     Rate and Rhythm: Normal rate and regular rhythm.     Heart sounds: Normal heart sounds.  Pulmonary:     Effort: Pulmonary effort is normal.     Breath sounds: Normal breath sounds.  Abdominal:     General: Bowel sounds are normal. There is distension.     Comments: Firm. Ascites +  Skin:    General: Skin is warm and dry.  Neurological:     Mental Status: She is alert and oriented to person, place, and time.      CMP Latest Ref Rng & Units 08/08/2018  Glucose 70 - 99 mg/dL 92  BUN 8 - 23 mg/dL 17  Creatinine 0.44 - 1.00 mg/dL 0.87  Sodium 135 - 145 mmol/L 137  Potassium 3.5 - 5.1 mmol/L 4.1  Chloride 98 - 111 mmol/L 97(L)  CO2 22 - 32 mmol/L 29  Calcium 8.9 - 10.3 mg/dL 8.9  Total Protein 6.5 - 8.1 g/dL 7.2  Total Bilirubin 0.3 - 1.2 mg/dL 0.3  Alkaline Phos 38 - 126 U/L 49  AST 15 - 41 U/L 35  ALT 0 - 44 U/L 23   CBC Latest Ref Rng & Units 08/08/2018  WBC 4.0 - 10.5 K/uL 5.2  Hemoglobin 12.0 - 15.0 g/dL 10.8(L)  Hematocrit 36.0 - 46.0 % 33.8(L)  Platelets 150 - 400 K/uL 306    No images are attached to the encounter.  Dg Chest 2 View  Result Date: 07/23/2018 CLINICAL DATA:  Fluid retention for 2 weeks. EXAM: CHEST - 2 VIEW COMPARISON:  Chest CT 07/12/2015 FINDINGS: The cardiac silhouette, mediastinal and hilar contours are within normal limits and stable. The lungs are clear of an acute process. No pleural effusions. No worrisome pulmonary lesions. The right nipple shadows overlying the right lower chest. Remote surgical changes from a thoracic spine corpectomy and lateral fusion. No complicating features. IMPRESSION: No acute cardiopulmonary findings. Electronically Signed   By: Marijo Sanes M.D.   On: 07/23/2018 16:56   Ct Chest W Contrast  Result Date: 08/04/2018 CLINICAL DATA:  Diarrhea, abdominal distension, edema EXAM: CT CHEST, ABDOMEN, AND PELVIS WITH CONTRAST TECHNIQUE: Multidetector CT imaging of the chest, abdomen and pelvis was performed  following the standard protocol during bolus administration of intravenous contrast. CONTRAST:  22m OMNIPAQUE IOHEXOL 300 MG/ML SOLN, additional oral enteric contrast COMPARISON:  None. FINDINGS: CT CHEST FINDINGS Cardiovascular: No significant vascular findings. Normal heart size. No pericardial effusion. Mediastinum/Nodes: No enlarged mediastinal, hilar, or axillary lymph nodes. Thyroid gland, trachea, and esophagus demonstrate no significant findings. Lungs/Pleura: Lungs are clear. No pleural effusion or pneumothorax. Musculoskeletal: No chest wall mass or suspicious bone lesions identified. Fusion of T7-T10. CT ABDOMEN PELVIS FINDINGS Hepatobiliary: No solid liver abnormality is seen. No gallstones, gallbladder wall thickening, or biliary dilatation. Pancreas: Unremarkable. No pancreatic ductal dilatation or surrounding inflammatory changes. Spleen: Normal in size without significant abnormality. Adrenals/Urinary Tract: Adrenal glands are unremarkable. Nonobstructive inferior pole left nephrolithiasis. Bladder is unremarkable. Stomach/Bowel: Stomach is within normal limits. Appendix is not clearly visualized and may be absent. No evidence of bowel wall thickening, distention, or inflammatory changes. Vascular/Lymphatic: No significant vascular findings are present. There are numerous bulky retroperitoneal lymph nodes, largest left periaortic nodes or conglomerates near the superior mesenteric artery measuring at least 4.4 x 2.9 cm (series 2, image 64). Reproductive: There are cystic lesions of the enlarged bilateral ovaries, largest lesion on the left measuring at least 6.2 cm (series 2, image 116, series 6, image 76) Other: No abdominal wall hernia or abnormality. Moderate volume four quadrant ascites. Musculoskeletal: No acute or significant osseous findings. IMPRESSION: 1. There are cystic lesions of the enlarged bilateral ovaries, largest lesion on the left measuring at least 6.2 cm (series 2, image 116,  series 6, image 76). Findings are highly concerning for ovarian neoplasm, particularly given presence of otherwise unexplained ascites and abdominal lymphadenopathy. 2. There are numerous bulky retroperitoneal lymph nodes, largest left periaortic nodes or conglomerates near the superior mesenteric artery measuring at least 4.4 x 2.9 cm (series 2, image 64), concerning for metastatic disease, or alternately primary malignancy such as lymphoma. 3.  Other chronic and incidental findings as detailed above. These results will be called to the ordering clinician or representative by the Radiologist Assistant, and communication documented in the PACS or zVision Dashboard. Electronically Signed   By: AEddie CandleM.D.   On: 08/04/2018 11:13   [place]oreaAbdomen Complete  Result Date: 07/23/2018 CLINICAL DATA:  Abdominal distension for 1 week with weight gain EXAM: ABDOMEN ULTRASOUND COMPLETE COMPARISON:  07/12/2015 CT chest, abdomen and pelvis. FINDINGS: Gallbladder: Mild diffuse gallbladder wall thickening. No gallbladder distention. No gallstones. No sonographic Murphy sign. Common bile duct: Diameter: 3 mm Liver: No focal lesion identified. No convincing liver surface irregularity. Normal liver parenchymal echogenicity and echotexture. Portal vein is patent on color Doppler imaging with normal direction of blood flow towards the liver. IVC: No abnormality visualized. Pancreas: Visualized portion unremarkable. Spleen: Size and appearance within normal limits. Right Kidney: Length: 8.2 cm. Echogenicity within normal limits. No mass or hydronephrosis visualized. Left Kidney: Length: 9.7 cm. Echogenicity within normal limits. No mass or hydronephrosis visualized. Abdominal aorta: No aneurysm visualized. Other findings: Large volume ascites, most prominent in the left lower quadrant. IMPRESSION: 1. Large volume ascites. 2. No overt morphologic changes of hepatic cirrhosis. 3. Normal size spleen. 4. Nonspecific mild diffuse  gallbladder wall thickening, suspect noninflammatory edema. No cholelithiasis. No biliary ductal dilatation. Electronically Signed   By: JIlona SorrelM.D.   On: 07/23/2018 19:45   Ct Abdomen Pelvis W Contrast  Result Date: 08/04/2018 CLINICAL DATA:  Diarrhea, abdominal distension, edema EXAM: CT CHEST, ABDOMEN, AND PELVIS WITH CONTRAST TECHNIQUE: Multidetector CT imaging of  the chest, abdomen and pelvis was performed following the standard protocol during bolus administration of intravenous contrast. CONTRAST:  57m OMNIPAQUE IOHEXOL 300 MG/ML SOLN, additional oral enteric contrast COMPARISON:  None. FINDINGS: CT CHEST FINDINGS Cardiovascular: No significant vascular findings. Normal heart size. No pericardial effusion. Mediastinum/Nodes: No enlarged mediastinal, hilar, or axillary lymph nodes. Thyroid gland, trachea, and esophagus demonstrate no significant findings. Lungs/Pleura: Lungs are clear. No pleural effusion or pneumothorax. Musculoskeletal: No chest wall mass or suspicious bone lesions identified. Fusion of T7-T10. CT ABDOMEN PELVIS FINDINGS Hepatobiliary: No solid liver abnormality is seen. No gallstones, gallbladder wall thickening, or biliary dilatation. Pancreas: Unremarkable. No pancreatic ductal dilatation or surrounding inflammatory changes. Spleen: Normal in size without significant abnormality. Adrenals/Urinary Tract: Adrenal glands are unremarkable. Nonobstructive inferior pole left nephrolithiasis. Bladder is unremarkable. Stomach/Bowel: Stomach is within normal limits. Appendix is not clearly visualized and may be absent. No evidence of bowel wall thickening, distention, or inflammatory changes. Vascular/Lymphatic: No significant vascular findings are present. There are numerous bulky retroperitoneal lymph nodes, largest left periaortic nodes or conglomerates near the superior mesenteric artery measuring at least 4.4 x 2.9 cm (series 2, image 64). Reproductive: There are cystic lesions of  the enlarged bilateral ovaries, largest lesion on the left measuring at least 6.2 cm (series 2, image 116, series 6, image 76) Other: No abdominal wall hernia or abnormality. Moderate volume four quadrant ascites. Musculoskeletal: No acute or significant osseous findings. IMPRESSION: 1. There are cystic lesions of the enlarged bilateral ovaries, largest lesion on the left measuring at least 6.2 cm (series 2, image 116, series 6, image 76). Findings are highly concerning for ovarian neoplasm, particularly given presence of otherwise unexplained ascites and abdominal lymphadenopathy. 2. There are numerous bulky retroperitoneal lymph nodes, largest left periaortic nodes or conglomerates near the superior mesenteric artery measuring at least 4.4 x 2.9 cm (series 2, image 64), concerning for metastatic disease, or alternately primary malignancy such as lymphoma. 3.  Other chronic and incidental findings as detailed above. These results will be called to the ordering clinician or representative by the Radiologist Assistant, and communication documented in the PACS or zVision Dashboard. Electronically Signed   By: AEddie CandleM.D.   On: 08/04/2018 11:13   [place]oreaParacentesis  Result Date: 07/29/2018 INDICATION: 63year old female with a history of recurrent ascites EXAM: ULTRASOUND GUIDED  PARACENTESIS MEDICATIONS: None. COMPLICATIONS: None PROCEDURE: Informed written consent was obtained from the patient after a discussion of the risks, benefits and alternatives to treatment. A timeout was performed prior to the initiation of the procedure. Initial ultrasound scanning demonstrates a moderate amount of ascites within the right lower abdominal quadrant. The right lower abdomen was prepped and draped in the usual sterile fashion. 1% lidocaine with epinephrine was used for local anesthesia. Following this, a 8 Fr Safe-T-Centesis catheter was introduced. An ultrasound image was saved for documentation purposes. The  paracentesis was performed. The catheter was removed and a dressing was applied. The patient tolerated the procedure well without immediate post procedural complication. FINDINGS: A total of approximately 5 L of thin yellow fluid was removed. IMPRESSION: Successful status post ultrasound-guided paracentesis. Signed, JDulcy Fanny WDellia Nims RPVI [facility] Electronically Signed   By: JCorrie MckusickD.O.   On: 07/29/2018 11:31     Assessment and plan- Patient is a 62y.o. 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 She is here to discuss neoadjuvant chemotherapy options  Gyn Onc recommendations  noted. Given bulky retroperitoneal adenopathy, neoadjuvant chemotherapy recommended. I would recommend 3 cycles of carboplatin AUC 5, taxol 175 mg/meter square avastin 15 mg/kg Q3 weeks IV with onproneulasta support.  I explained to the patient the risks and benefits of chemotherapy including all but not limited to nausea, vomiting, low blood counts and risk of infection and hospitalization. Risk of infusion reaction and peripheral neuropathy associated with taxol. Discussed risks and benefits of avastin including all but not limited to proteinuria, hypertension, thromboembolic events.  Patient understands and agrees to proceed as planned. Patient will go through chemo teach and tentatively start chemotherapy next week. She has good peripheral IV lines and we will hold off on port at this time. Treatment will be given with a curative intent.   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 She sees Dr. Etta Grandchild who has adjust her levothyroxine.  Check CBC with diff/cmp/ ca 125 and urine protein in 1 week.   Cancer Staging Serous adenocarcinoma [facility]) Staging form: Exocrine Pancreas, AJCC 8th Edition - Clinical stage from 08/14/2018: Stage III (cT3, cN2, cM0) - Signed by Sindy Guadeloupe, MD on 08/17/2018       Visit Diagnosis 1. Serous adenocarcinoma ([place])   2. Goals of care, counseling/discussion      Dr. Randa Evens, MD, MPH [facility] at [facility] [phone] 08/17/2018 7:43 PM

## 2018-08-17 NOTE — Progress Notes (Signed)
START ON PATHWAY REGIMEN - Ovarian     A cycle is every 21 days:     Paclitaxel      Carboplatin   **Always confirm dose/schedule in your pharmacy ordering system**  Patient Characteristics: Preoperative or Nonsurgical Candidate (Clinical Staging), Newly Diagnosed, Neoadjuvant Therapy followed by Surgery Therapeutic Status: Preoperative or Nonsurgical Candidate (Clinical Staging) BRCA Mutation Status: Awaiting Test Results AJCC T Category: cT3 AJCC 8 Stage Grouping: Unknown AJCC N Category: cN1b AJCC M Category: cM0 Therapy Plan: Neoadjuvant Therapy followed by Surgery Intent of Therapy: Curative Intent, Discussed with Patient

## 2018-08-18 ENCOUNTER — Inpatient Hospital Stay: Payer: Commercial Managed Care - PPO

## 2018-08-19 ENCOUNTER — Inpatient Hospital Stay: Payer: Commercial Managed Care - PPO

## 2018-08-19 ENCOUNTER — Other Ambulatory Visit: Payer: Self-pay | Admitting: *Deleted

## 2018-08-19 ENCOUNTER — Other Ambulatory Visit: Payer: Commercial Managed Care - PPO

## 2018-08-19 ENCOUNTER — Other Ambulatory Visit: Payer: Self-pay

## 2018-08-19 DIAGNOSIS — Z20822 Contact with and (suspected) exposure to covid-19: Secondary | ICD-10-CM

## 2018-08-19 DIAGNOSIS — C801 Malignant (primary) neoplasm, unspecified: Secondary | ICD-10-CM

## 2018-08-20 ENCOUNTER — Inpatient Hospital Stay: Payer: Commercial Managed Care - PPO

## 2018-08-20 ENCOUNTER — Other Ambulatory Visit: Payer: Self-pay

## 2018-08-20 ENCOUNTER — Telehealth: Payer: Self-pay

## 2018-08-20 ENCOUNTER — Other Ambulatory Visit: Payer: Self-pay | Admitting: Nurse Practitioner

## 2018-08-20 ENCOUNTER — Ambulatory Visit: Payer: Commercial Managed Care - PPO

## 2018-08-20 DIAGNOSIS — C801 Malignant (primary) neoplasm, unspecified: Secondary | ICD-10-CM

## 2018-08-20 NOTE — Telephone Encounter (Signed)
Patient states during chemo class today she went to get her Covid test yesterday but they said "I am not suppose to be here".   Order given to get Covid test today.  Called patient home phone but no answer and no voice mail available.  Called patient cell phone and went straight to voice mail.  Left message to go to [facility] across from [facility] as soon as she received voice mail to have Covid test today.  Informed patient they closed at 4pm and she can still get her chemo tomorrow even though results pending.   Left office number if any questions.

## 2018-08-21 ENCOUNTER — Other Ambulatory Visit: Payer: Self-pay

## 2018-08-21 ENCOUNTER — Encounter: Payer: Self-pay | Admitting: Oncology

## 2018-08-21 ENCOUNTER — Inpatient Hospital Stay: Payer: Commercial Managed Care - PPO

## 2018-08-21 ENCOUNTER — Inpatient Hospital Stay (HOSPITAL_BASED_OUTPATIENT_CLINIC_OR_DEPARTMENT_OTHER): Payer: Commercial Managed Care - PPO | Admitting: Oncology

## 2018-08-21 ENCOUNTER — Other Ambulatory Visit: Payer: Self-pay | Admitting: *Deleted

## 2018-08-21 VITALS — BP 102/67

## 2018-08-21 VITALS — BP 103/70 | HR 93 | Temp 97.0°F | Resp 18 | Wt 125.3 lb

## 2018-08-21 DIAGNOSIS — D649 Anemia, unspecified: Secondary | ICD-10-CM | POA: Diagnosis not present

## 2018-08-21 DIAGNOSIS — Z7689 Persons encountering health services in other specified circumstances: Secondary | ICD-10-CM | POA: Diagnosis not present

## 2018-08-21 DIAGNOSIS — E538 Deficiency of other specified B group vitamins: Secondary | ICD-10-CM | POA: Diagnosis not present

## 2018-08-21 DIAGNOSIS — Z5112 Encounter for antineoplastic immunotherapy: Secondary | ICD-10-CM | POA: Diagnosis not present

## 2018-08-21 DIAGNOSIS — E7800 Pure hypercholesterolemia, unspecified: Secondary | ICD-10-CM | POA: Diagnosis not present

## 2018-08-21 DIAGNOSIS — Z79899 Other long term (current) drug therapy: Secondary | ICD-10-CM | POA: Diagnosis not present

## 2018-08-21 DIAGNOSIS — R180 Malignant ascites: Secondary | ICD-10-CM | POA: Diagnosis not present

## 2018-08-21 DIAGNOSIS — C562 Malignant neoplasm of left ovary: Secondary | ICD-10-CM

## 2018-08-21 DIAGNOSIS — C561 Malignant neoplasm of right ovary: Secondary | ICD-10-CM

## 2018-08-21 DIAGNOSIS — R809 Proteinuria, unspecified: Secondary | ICD-10-CM | POA: Diagnosis not present

## 2018-08-21 DIAGNOSIS — I10 Essential (primary) hypertension: Secondary | ICD-10-CM | POA: Diagnosis not present

## 2018-08-21 DIAGNOSIS — E1136 Type 2 diabetes mellitus with diabetic cataract: Secondary | ICD-10-CM | POA: Diagnosis not present

## 2018-08-21 DIAGNOSIS — Z515 Encounter for palliative care: Secondary | ICD-10-CM | POA: Diagnosis not present

## 2018-08-21 DIAGNOSIS — E039 Hypothyroidism, unspecified: Secondary | ICD-10-CM | POA: Diagnosis not present

## 2018-08-21 DIAGNOSIS — C801 Malignant (primary) neoplasm, unspecified: Secondary | ICD-10-CM

## 2018-08-21 DIAGNOSIS — Z5111 Encounter for antineoplastic chemotherapy: Secondary | ICD-10-CM | POA: Diagnosis not present

## 2018-08-21 DIAGNOSIS — E119 Type 2 diabetes mellitus without complications: Secondary | ICD-10-CM | POA: Diagnosis not present

## 2018-08-21 DIAGNOSIS — R634 Abnormal weight loss: Secondary | ICD-10-CM | POA: Diagnosis not present

## 2018-08-21 DIAGNOSIS — E785 Hyperlipidemia, unspecified: Secondary | ICD-10-CM | POA: Diagnosis not present

## 2018-08-21 DIAGNOSIS — R590 Localized enlarged lymph nodes: Secondary | ICD-10-CM | POA: Diagnosis not present

## 2018-08-21 DIAGNOSIS — Z7982 Long term (current) use of aspirin: Secondary | ICD-10-CM | POA: Diagnosis not present

## 2018-08-21 DIAGNOSIS — D638 Anemia in other chronic diseases classified elsewhere: Secondary | ICD-10-CM

## 2018-08-21 DIAGNOSIS — J309 Allergic rhinitis, unspecified: Secondary | ICD-10-CM | POA: Diagnosis not present

## 2018-08-21 DIAGNOSIS — Z7984 Long term (current) use of oral hypoglycemic drugs: Secondary | ICD-10-CM | POA: Diagnosis not present

## 2018-08-21 LAB — COMPREHENSIVE METABOLIC PANEL
ALT: 17 U/L (ref 0–44)
AST: 28 U/L (ref 15–41)
Albumin: 2.7 g/dL — ABNORMAL LOW (ref 3.5–5.0)
Alkaline Phosphatase: 45 U/L (ref 38–126)
Anion gap: 10 (ref 5–15)
BUN: 15 mg/dL (ref 8–23)
CO2: 25 mmol/L (ref 22–32)
Calcium: 8.3 mg/dL — ABNORMAL LOW (ref 8.9–10.3)
Chloride: 101 mmol/L (ref 98–111)
Creatinine, Ser: 0.68 mg/dL (ref 0.44–1.00)
GFR calc Af Amer: >60 mL/min (ref >60)
GFR calc non Af Amer: >60 mL/min (ref >60)
Glucose, Bld: 153 mg/dL — ABNORMAL HIGH (ref 70–99)
Potassium: 4 mmol/L (ref 3.5–5.1)
Sodium: 136 mmol/L (ref 135–145)
Total Bilirubin: 0.4 mg/dL (ref 0.3–1.2)
Total Protein: 6.4 g/dL — ABNORMAL LOW (ref 6.5–8.1)

## 2018-08-21 LAB — CBC WITH DIFFERENTIAL/PLATELET
Abs Immature Granulocytes: 0.02 10*3/uL (ref 0.00–0.07)
Basophils Absolute: 0.1 10*3/uL (ref 0.0–0.1)
Basophils Relative: 1 %
Eosinophils Absolute: 0.3 10*3/uL (ref 0.0–0.5)
Eosinophils Relative: 6 %
HCT: 31.2 % — ABNORMAL LOW (ref 36.0–46.0)
Hemoglobin: 10 g/dL — ABNORMAL LOW (ref 12.0–15.0)
Immature Granulocytes: 0 %
Lymphocytes Relative: 16 %
Lymphs Abs: 0.7 10*3/uL (ref 0.7–4.0)
MCH: 32.6 pg (ref 26.0–34.0)
MCHC: 32.1 g/dL (ref 30.0–36.0)
MCV: 101.6 fL — ABNORMAL HIGH (ref 80.0–100.0)
Monocytes Absolute: 0.4 10*3/uL (ref 0.1–1.0)
Monocytes Relative: 10 %
Neutro Abs: 3 10*3/uL (ref 1.7–7.7)
Neutrophils Relative %: 67 %
Platelets: 253 10*3/uL (ref 150–400)
RBC: 3.07 MIL/uL — ABNORMAL LOW (ref 3.87–5.11)
RDW: 15.8 % — ABNORMAL HIGH (ref 11.5–15.5)
WBC: 4.5 10*3/uL (ref 4.0–10.5)
nRBC: 0 % (ref 0.0–0.2)

## 2018-08-21 LAB — PROTEIN, URINE, RANDOM: Total Protein, Urine: 30 mg/dL

## 2018-08-21 MED ORDER — SODIUM CHLORIDE 0.9 % IV SOLN
15.0000 mg/kg | Freq: Once | INTRAVENOUS | Status: AC
  Administered 2018-08-21: 800 mg via INTRAVENOUS
  Filled 2018-08-21: qty 32

## 2018-08-21 MED ORDER — SODIUM CHLORIDE 0.9 % IV SOLN
Freq: Once | INTRAVENOUS | Status: AC
  Administered 2018-08-21: 10:00:00 via INTRAVENOUS
  Filled 2018-08-21: qty 250

## 2018-08-21 MED ORDER — FAMOTIDINE IN NACL 20-0.9 MG/50ML-% IV SOLN
20.0000 mg | Freq: Once | INTRAVENOUS | Status: AC
  Administered 2018-08-21: 10:00:00 20 mg via INTRAVENOUS
  Filled 2018-08-21: qty 50

## 2018-08-21 MED ORDER — DIPHENHYDRAMINE HCL 50 MG/ML IJ SOLN
50.0000 mg | Freq: Once | INTRAMUSCULAR | Status: AC
  Administered 2018-08-21: 10:00:00 50 mg via INTRAVENOUS
  Filled 2018-08-21: qty 1

## 2018-08-21 MED ORDER — SODIUM CHLORIDE 0.9 % IV SOLN
430.0000 mg | Freq: Once | INTRAVENOUS | Status: AC
  Administered 2018-08-21: 16:00:00 430 mg via INTRAVENOUS
  Filled 2018-08-21: qty 43

## 2018-08-21 MED ORDER — SODIUM CHLORIDE 0.9 % IV SOLN
175.0000 mg/m2 | Freq: Once | INTRAVENOUS | Status: AC
  Administered 2018-08-21: 12:00:00 270 mg via INTRAVENOUS
  Filled 2018-08-21: qty 45

## 2018-08-21 MED ORDER — PALONOSETRON HCL INJECTION 0.25 MG/5ML
0.2500 mg | Freq: Once | INTRAVENOUS | Status: AC
  Administered 2018-08-21: 0.25 mg via INTRAVENOUS
  Filled 2018-08-21: qty 5

## 2018-08-21 MED ORDER — PEGFILGRASTIM 6 MG/0.6ML ~~LOC~~ PSKT
6.0000 mg | PREFILLED_SYRINGE | Freq: Once | SUBCUTANEOUS | Status: AC
  Administered 2018-08-21: 15:00:00 6 mg via SUBCUTANEOUS
  Filled 2018-08-21: qty 0.6

## 2018-08-21 MED ORDER — SODIUM CHLORIDE 0.9 % IV SOLN
Freq: Once | INTRAVENOUS | Status: AC
  Administered 2018-08-21: 11:00:00 via INTRAVENOUS
  Filled 2018-08-21: qty 5

## 2018-08-21 NOTE — Progress Notes (Signed)
Pt in for follow up reports having difficulty sleeping.  Pt also verbalized she is not taking any fluid medication at this time.

## 2018-08-21 NOTE — Addendum Note (Signed)
Addended by: Vernetta Honey on: 08/21/2018 09:38 AM   Modules accepted: Orders

## 2018-08-21 NOTE — Progress Notes (Signed)
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  Chief complaint/Reason for visit- Initial Meeting for [facility], preparing for starting chemotherapy  Heme/Onc history:  Oncology History  Serous adenocarcinoma ([place])  08/13/2018 Initial Diagnosis   Serous adenocarcinoma ([place])   08/14/2018 Cancer Staging   Staging form: Exocrine Pancreas, AJCC 8th Edition - Clinical stage from 08/14/2018: Stage III (cT3, cN2, cM0) - Signed by Sindy Guadeloupe, MD on 08/17/2018   08/21/2018 -  Chemotherapy   The patient had palonosetron (ALOXI) injection 0.25 mg, 0.25 mg, Intravenous,  Once, 1 of 6 cycles pegfilgrastim (NEULASTA ONPRO KIT) injection 6 mg, 6 mg, Subcutaneous, Once, 1 of 6 cycles bevacizumab (AVASTIN) 800 mg in sodium chloride 0.9 % 100 mL chemo infusion, 15 mg/kg = 800 mg (100 % of original dose 15 mg/kg), Intravenous,  Once, 1 of 6 cycles Dose modification: 15 mg/kg (original dose 15 mg/kg, Cycle 1) CARBOplatin (PARAPLATIN) 430 mg in sodium chloride 0.9 % 250 mL chemo infusion, 430 mg (100 % of original dose 429.5 mg), Intravenous,  Once, 1 of 6 cycles Dose modification:   (original dose 429.5 mg, Cycle 1) PACLitaxel (TAXOL) 270 mg in sodium chloride 0.9 % 250 mL chemo infusion (> '80mg'$ /m2), 175 mg/m2 = 270 mg, Intravenous,  Once, 1 of 6 cycles fosaprepitant (EMEND) 150 mg, dexamethasone (DECADRON) 12 mg in sodium chloride 0.9 % 145 mL IVPB, , Intravenous,  Once, 1 of 6 cycles  for chemotherapy treatment.      Interval history-Meredith Jones is a 63 year old female who presents to chemo care clinic today for initial meeting in preparation for starting  chemotherapy. I introduced the chemo care clinic and we discussed that the role of the clinic is to assist those who are at an increased risk of emergency room visits and/or complications during the course of chemotherapy treatment. We discussed that the increased risk takes into account factors such as age, performance status, and co-morbidities. We also discussed that for some, this might include barriers to care such as not having a primary care provider, lack of insurance/transportation, or not being able to afford medications. We discussed that the goal of the program is to help prevent unplanned ER visits and help reduce complications during chemotherapy. We do this by discussing specific risk factors to each individual and identifying ways that we can help improve these risk factors and reduce barriers to care.   ECOG FS:1 - Symptomatic but completely ambulatory  Review of systems- Review of Systems  Constitutional: Positive for malaise/fatigue. Negative for chills, fever and weight loss.  HENT: Negative for congestion, ear pain and tinnitus.   Eyes: Negative.  Negative for blurred vision and double vision.  Respiratory: Negative.  Negative for cough, sputum production and shortness of breath.   Cardiovascular: Positive for leg swelling. Negative for chest pain and palpitations.  Gastrointestinal: Positive for abdominal pain. Negative for constipation, diarrhea, nausea and vomiting.       Distention  Genitourinary: Negative for dysuria, frequency and urgency.  Musculoskeletal: Negative for back pain and falls.  Skin: Negative.  Negative for rash.  Neurological: Positive for weakness. Negative for headaches.  Endo/Heme/Allergies: Negative.  Does not bruise/bleed easily.  Psychiatric/Behavioral: Negative.  Negative for depression. The patient is not nervous/anxious and does not have insomnia.      Current treatment-beginning treatment today.  Cycle 1 of carbotaxol Avastin  Allergies    Allergen Reactions   Clarithromycin Nausea Only   Doxycycline     nausea, dizziness.   Penicillins Hives    Past Medical History:  Diagnosis Date   Allergy    Diabetes mellitus without complication ([place])    Diverticulosis    Hyperlipidemia    Hypothyroidism    Thyroid disease     Past Surgical History:  Procedure Laterality Date   BACK SURGERY  2007   Lumbar Surgery   BREAST CYST ASPIRATION Right    Around 10 years ago. Pt thinks it was the right breast but not sure.   CATARACT EXTRACTION Left 2010   Right Eye 2011   COLONOSCOPY WITH PROPOFOL N/A 09/02/2015   Procedure: COLONOSCOPY WITH PROPOFOL;  Surgeon: [name], MD;  Location: [place] [facility];  Service: Endoscopy;  Laterality: N/A;  Diabetic - oral meds   ESOPHAGOGASTRODUODENOSCOPY (EGD) WITH PROPOFOL N/A 11/03/2015   Procedure: ESOPHAGOGASTRODUODENOSCOPY (EGD) WITH PROPOFOL;  Surgeon: [name], MD;  Location: [place];  Service: Endoscopy;  Laterality: N/A;   EYE SURGERY Bilateral 06/2014    tear duck catherization ( Dr. Vallarie Mare)   POLYPECTOMY  09/02/2015   Procedure: POLYPECTOMY;  Surgeon: [name], MD;  Location: [place];  Service: Endoscopy;;   TONSILLECTOMY  1963    Social History   Socioeconomic History   Marital status: Single    Spouse name: Not on file   Number of children: Not on file   Years of education: Not on file   Highest education level: Not on file  Occupational History   Not on file  Social Needs   Financial resource strain: Not on file   Food insecurity    Worry: Not on file    Inability: Not on file   Transportation needs    Medical: Not on file    Non-medical: Not on file  Tobacco Use   Smoking status: Never Smoker   Smokeless tobacco: Never Used  Substance and Sexual Activity   Alcohol use: No   Drug use: No   Sexual activity: Not on file  Lifestyle   Physical activity    Days per week: Not on file    Minutes  per session: Not on file   Stress: Not on file  Relationships   Social connections    Talks on phone: Not on file    Gets together: Not on file    Attends religious service: Not on file    Active member of club or organization: Not on file    Attends meetings of clubs or organizations: Not on file    Relationship status: Not on file   Intimate partner violence    Fear of current or ex partner: Not on file    Emotionally abused: Not on file    Physically abused: Not on file    Forced sexual activity: Not on file  Other Topics Concern   Not on file  Social History Narrative   Not on file    Family History  Problem Relation Age of Onset   Congestive Heart Failure Mother    Diabetes Mother    Stroke Mother    Hypothyroidism Father    Cancer Father        lung cancer   Cancer Paternal Grandmother  stomach     Current Outpatient Medications:    aspirin 81 MG tablet, ASPIRIN, '81MG'$  (Oral Tablet Delayed Release)  1 a week for 0 days  Quantity: 0.00;  Refills: 0   Ordered :02-Oct-2010  Doy Hutching ;  Started 12-Mar-2007 Active, Disp: , Rfl:    calcipotriene (DOVONOX) 0.005 % ointment, CALCIPOTRIENE, 0.005% (External Ointment) - Historical Medication  apply to skin daily (0.005 %) Active, Disp: , Rfl:    clobetasol ointment (TEMOVATE) 0.05 %, CLOBETASOL PROPIONATE, 0.05% (External Ointment) - Historical Medication  apply to skin daily (0.05 %) Active, Disp: , Rfl:    dexamethasone (DECADRON) 4 MG tablet, Take 2 tablets (8 mg total) by mouth daily. Start the day after carboplatin chemotherapy for 3 days., Disp: 30 tablet, Rfl: 1   fexofenadine (ALLEGRA) 60 MG tablet, Take 60 mg by mouth daily. Reported on 09/01/2015, Disp: , Rfl:    furosemide (LASIX) 20 MG tablet, Take 1 tablet (20 mg total) by mouth daily. (Patient not taking: Reported on 08/21/2018), Disp: 20 tablet, Rfl: 0   glipiZIDE (GLUCOTROL) 5 MG tablet, TAKE 1 TABLET BY MOUTH DAILY BEFORE BREAKFAST,  Disp: 90 tablet, Rfl: 1   glucose blood (ONE TOUCH ULTRA TEST) test strip, USE TO TEST BLOOD SUGAR TWICE A DAY AS INSTRUCTED, Disp: 100 each, Rfl: 12   levothyroxine (SYNTHROID) 100 MCG tablet, Take 1 tablet (100 mcg total) by mouth daily., Disp: 90 tablet, Rfl: 1   lidocaine-prilocaine (EMLA) cream, Apply to affected area once, Disp: 30 g, Rfl: 3   LORazepam (ATIVAN) 0.5 MG tablet, Take 1 tablet (0.5 mg total) by mouth every 6 (six) hours as needed (Nausea or vomiting)., Disp: 30 tablet, Rfl: 0   metFORMIN (GLUCOPHAGE) 500 MG tablet, Take 2 tablets (1,000 mg total) by mouth 2 (two) times daily., Disp: 360 tablet, Rfl: 1   MULTIPLE VITAMIN PO, MULTIVITAMINS (Oral Tablet)  1 Every Day for 0 days  Quantity: 0.00;  Refills: 0   Ordered :02-Oct-2010  Doy Hutching ;  Started 12-Mar-2007 Active, Disp: , Rfl:    ondansetron (ZOFRAN) 8 MG tablet, Take 1 tablet (8 mg total) by mouth 2 (two) times daily as needed for refractory nausea / vomiting. Start on day 3 after carboplatin chemo., Disp: 30 tablet, Rfl: 1   ONETOUCH DELICA LANCETS 63K MISC, USE TO TEST BLOOD SUGAR ONCE A DAY, Disp: 100 each, Rfl: 12   prochlorperazine (COMPAZINE) 10 MG tablet, Take 1 tablet (10 mg total) by mouth every 6 (six) hours as needed (Nausea or vomiting)., Disp: 30 tablet, Rfl: 1   simvastatin (ZOCOR) 20 MG tablet, Take 1 tablet (20 mg total) by mouth at bedtime., Disp: 90 tablet, Rfl: 1   spironolactone (ALDACTONE) 50 MG tablet, Take 1 tablet (50 mg total) by mouth daily. (Patient not taking: Reported on 08/21/2018), Disp: 20 tablet, Rfl: 0 No current facility-administered medications for this visit.   Facility-Administered Medications Ordered in Other Visits:    CARBOplatin (PARAPLATIN) 430 mg in sodium chloride 0.9 % 250 mL chemo infusion, 430 mg, Intravenous, Once, Sindy Guadeloupe, MD   pegfilgrastim (NEULASTA ONPRO KIT) injection 6 mg, 6 mg, Subcutaneous, Once, Sindy Guadeloupe, MD  Physical exam: There were no  vitals filed for this visit. Physical Exam Constitutional:      Appearance: Normal appearance.  HENT:     Head: Normocephalic and atraumatic.  Eyes:     Pupils: Pupils are equal, round, and reactive to light.  Neck:     Musculoskeletal: Normal  range of motion.  Cardiovascular:     Rate and Rhythm: Normal rate and regular rhythm.     Heart sounds: Normal heart sounds. No murmur.  Pulmonary:     Effort: Pulmonary effort is normal.     Breath sounds: Normal breath sounds. No wheezing.  Abdominal:     General: Bowel sounds are normal. There is distension.     Palpations: Abdomen is soft.     Tenderness: There is no abdominal tenderness.  Musculoskeletal: Normal range of motion.     Right ankle: She exhibits swelling.     Left ankle: She exhibits swelling.  Skin:    General: Skin is warm and dry.     Findings: No rash.  Neurological:     Mental Status: She is alert and oriented to person, place, and time.  Psychiatric:        Judgment: Judgment normal.      CMP Latest Ref Rng & Units 08/21/2018  Glucose 70 - 99 mg/dL 153(H)  BUN 8 - 23 mg/dL 15  Creatinine 0.44 - 1.00 mg/dL 0.68  Sodium 135 - 145 mmol/L 136  Potassium 3.5 - 5.1 mmol/L 4.0  Chloride 98 - 111 mmol/L 101  CO2 22 - 32 mmol/L 25  Calcium 8.9 - 10.3 mg/dL 8.3(L)  Total Protein 6.5 - 8.1 g/dL 6.4(L)  Total Bilirubin 0.3 - 1.2 mg/dL 0.4  Alkaline Phos 38 - 126 U/L 45  AST 15 - 41 U/L 28  ALT 0 - 44 U/L 17   CBC Latest Ref Rng & Units 08/21/2018  WBC 4.0 - 10.5 K/uL 4.5  Hemoglobin 12.0 - 15.0 g/dL 10.0(L)  Hematocrit 36.0 - 46.0 % 31.2(L)  Platelets 150 - 400 K/uL 253    No images are attached to the encounter.  Dg Chest 2 View  Result Date: 07/23/2018 CLINICAL DATA:  Fluid retention for 2 weeks. EXAM: CHEST - 2 VIEW COMPARISON:  Chest CT 07/12/2015 FINDINGS: The cardiac silhouette, mediastinal and hilar contours are within normal limits and stable. The lungs are clear of an acute process. No pleural  effusions. No worrisome pulmonary lesions. The right nipple shadows overlying the right lower chest. Remote surgical changes from a thoracic spine corpectomy and lateral fusion. No complicating features. IMPRESSION: No acute cardiopulmonary findings. Electronically Signed   By: Marijo Sanes M.D.   On: 07/23/2018 16:56   Ct Chest W Contrast  Result Date: 08/04/2018 CLINICAL DATA:  Diarrhea, abdominal distension, edema EXAM: CT CHEST, ABDOMEN, AND PELVIS WITH CONTRAST TECHNIQUE: Multidetector CT imaging of the chest, abdomen and pelvis was performed following the standard protocol during bolus administration of intravenous contrast. CONTRAST:  20m OMNIPAQUE IOHEXOL 300 MG/ML SOLN, additional oral enteric contrast COMPARISON:  None. FINDINGS: CT CHEST FINDINGS Cardiovascular: No significant vascular findings. Normal heart size. No pericardial effusion. Mediastinum/Nodes: No enlarged mediastinal, hilar, or axillary lymph nodes. Thyroid gland, trachea, and esophagus demonstrate no significant findings. Lungs/Pleura: Lungs are clear. No pleural effusion or pneumothorax. Musculoskeletal: No chest wall mass or suspicious bone lesions identified. Fusion of T7-T10. CT ABDOMEN PELVIS FINDINGS Hepatobiliary: No solid liver abnormality is seen. No gallstones, gallbladder wall thickening, or biliary dilatation. Pancreas: Unremarkable. No pancreatic ductal dilatation or surrounding inflammatory changes. Spleen: Normal in size without significant abnormality. Adrenals/Urinary Tract: Adrenal glands are unremarkable. Nonobstructive inferior pole left nephrolithiasis. Bladder is unremarkable. Stomach/Bowel: Stomach is within normal limits. Appendix is not clearly visualized and may be absent. No evidence of bowel wall thickening, distention, or inflammatory changes. Vascular/Lymphatic: No  significant vascular findings are present. There are numerous bulky retroperitoneal lymph nodes, largest left periaortic nodes or  conglomerates near the superior mesenteric artery measuring at least 4.4 x 2.9 cm (series 2, image 64). Reproductive: There are cystic lesions of the enlarged bilateral ovaries, largest lesion on the left measuring at least 6.2 cm (series 2, image 116, series 6, image 76) Other: No abdominal wall hernia or abnormality. Moderate volume four quadrant ascites. Musculoskeletal: No acute or significant osseous findings. IMPRESSION: 1. There are cystic lesions of the enlarged bilateral ovaries, largest lesion on the left measuring at least 6.2 cm (series 2, image 116, series 6, image 76). Findings are highly concerning for ovarian neoplasm, particularly given presence of otherwise unexplained ascites and abdominal lymphadenopathy. 2. There are numerous bulky retroperitoneal lymph nodes, largest left periaortic nodes or conglomerates near the superior mesenteric artery measuring at least 4.4 x 2.9 cm (series 2, image 64), concerning for metastatic disease, or alternately primary malignancy such as lymphoma. 3.  Other chronic and incidental findings as detailed above. These results will be called to the ordering clinician or representative by the Radiologist Assistant, and communication documented in the PACS or zVision Dashboard. Electronically Signed   By: Eddie Candle M.D.   On: 08/04/2018 11:13   US Abdomen Complete  Result Date: 07/23/2018 CLINICAL DATA:  Abdominal distension for 1 week with weight gain EXAM: ABDOMEN ULTRASOUND COMPLETE COMPARISON:  07/12/2015 CT chest, abdomen and pelvis. FINDINGS: Gallbladder: Mild diffuse gallbladder wall thickening. No gallbladder distention. No gallstones. No sonographic Murphy sign. Common bile duct: Diameter: 3 mm Liver: No focal lesion identified. No convincing liver surface irregularity. Normal liver parenchymal echogenicity and echotexture. Portal vein is patent on color Doppler imaging with normal direction of blood flow towards the liver. IVC: No abnormality visualized.  Pancreas: Visualized portion unremarkable. Spleen: Size and appearance within normal limits. Right Kidney: Length: 8.2 cm. Echogenicity within normal limits. No mass or hydronephrosis visualized. Left Kidney: Length: 9.7 cm. Echogenicity within normal limits. No mass or hydronephrosis visualized. Abdominal aorta: No aneurysm visualized. Other findings: Large volume ascites, most prominent in the left lower quadrant. IMPRESSION: 1. Large volume ascites. 2. No overt morphologic changes of hepatic cirrhosis. 3. Normal size spleen. 4. Nonspecific mild diffuse gallbladder wall thickening, suspect noninflammatory edema. No cholelithiasis. No biliary ductal dilatation. Electronically Signed   By: Ilona Sorrel M.D.   On: 07/23/2018 19:45   Ct Abdomen Pelvis W Contrast  Result Date: 08/04/2018 CLINICAL DATA:  Diarrhea, abdominal distension, edema EXAM: CT CHEST, ABDOMEN, AND PELVIS WITH CONTRAST TECHNIQUE: Multidetector CT imaging of the chest, abdomen and pelvis was performed following the standard protocol during bolus administration of intravenous contrast. CONTRAST:  27m OMNIPAQUE IOHEXOL 300 MG/ML SOLN, additional oral enteric contrast COMPARISON:  None. FINDINGS: CT CHEST FINDINGS Cardiovascular: No significant vascular findings. Normal heart size. No pericardial effusion. Mediastinum/Nodes: No enlarged mediastinal, hilar, or axillary lymph nodes. Thyroid gland, trachea, and esophagus demonstrate no significant findings. Lungs/Pleura: Lungs are clear. No pleural effusion or pneumothorax. Musculoskeletal: No chest wall mass or suspicious bone lesions identified. Fusion of T7-T10. CT ABDOMEN PELVIS FINDINGS Hepatobiliary: No solid liver abnormality is seen. No gallstones, gallbladder wall thickening, or biliary dilatation. Pancreas: Unremarkable. No pancreatic ductal dilatation or surrounding inflammatory changes. Spleen: Normal in size without significant abnormality. Adrenals/Urinary Tract: Adrenal glands are  unremarkable. Nonobstructive inferior pole left nephrolithiasis. Bladder is unremarkable. Stomach/Bowel: Stomach is within normal limits. Appendix is not clearly visualized and may be absent. No evidence of bowel  wall thickening, distention, or inflammatory changes. Vascular/Lymphatic: No significant vascular findings are present. There are numerous bulky retroperitoneal lymph nodes, largest left periaortic nodes or conglomerates near the superior mesenteric artery measuring at least 4.4 x 2.9 cm (series 2, image 64). Reproductive: There are cystic lesions of the enlarged bilateral ovaries, largest lesion on the left measuring at least 6.2 cm (series 2, image 116, series 6, image 76) Other: No abdominal wall hernia or abnormality. Moderate volume four quadrant ascites. Musculoskeletal: No acute or significant osseous findings. IMPRESSION: 1. There are cystic lesions of the enlarged bilateral ovaries, largest lesion on the left measuring at least 6.2 cm (series 2, image 116, series 6, image 76). Findings are highly concerning for ovarian neoplasm, particularly given presence of otherwise unexplained ascites and abdominal lymphadenopathy. 2. There are numerous bulky retroperitoneal lymph nodes, largest left periaortic nodes or conglomerates near the superior mesenteric artery measuring at least 4.4 x 2.9 cm (series 2, image 64), concerning for metastatic disease, or alternately primary malignancy such as lymphoma. 3.  Other chronic and incidental findings as detailed above. These results will be called to the ordering clinician or representative by the Radiologist Assistant, and communication documented in the PACS or zVision Dashboard. Electronically Signed   By: Eddie Candle M.D.   On: 08/04/2018 11:13   US Paracentesis  Result Date: 07/29/2018 INDICATION: 63 year old female with a history of recurrent ascites EXAM: ULTRASOUND GUIDED  PARACENTESIS MEDICATIONS: None. COMPLICATIONS: None PROCEDURE: Informed  written consent was obtained from the patient after a discussion of the risks, benefits and alternatives to treatment. A timeout was performed prior to the initiation of the procedure. Initial ultrasound scanning demonstrates a moderate amount of ascites within the right lower abdominal quadrant. The right lower abdomen was prepped and draped in the usual sterile fashion. 1% lidocaine with epinephrine was used for local anesthesia. Following this, a 8 Fr Safe-T-Centesis catheter was introduced. An ultrasound image was saved for documentation purposes. The paracentesis was performed. The catheter was removed and a dressing was applied. The patient tolerated the procedure well without immediate post procedural complication. FINDINGS: A total of approximately 5 L of thin yellow fluid was removed. IMPRESSION: Successful status post ultrasound-guided paracentesis. Signed, Dulcy Fanny. Dellia Nims, RPVI [facility] Electronically Signed   By: Corrie Mckusick D.O.   On: 07/29/2018 11:31     Assessment and plan- Patient is a 63 y.o. female who presents to [facility] for initial meeting in preparation for starting chemotherapy for the treatment of carbo Taxol and Avastin.   1. Cancer-stage III adenocarcinoma of the ovary.  Patient initially presented with symptoms of bilateral lower extremity swelling and abdominal distention requiring a CT abdomen chest for further work-up.  CT scan showed enlarged bilateral ovaries and numerous bulky retroperitoneal lymph nodes.  She had significant ascites.  Paracentesis performed revealing high-grade serous carcinoma.  She is followed by Dr. Janese Banks her medical oncologist and Dr. Theora Gianotti for gynecological oncology.  Plan is for neoadjuvant chemotherapy with carbo/Taxol and Avastin with Neulasta support.  Receiving first cycle today.  2. Chemo Care Clinic/High Risk for ER/Hospitalization during chemotherapy- We discussed the  role of the chemo care clinic and identified patient specific risk factors. I discussed that patient was identified as high risk primarily based on stage of cancer diagnosis, comorbidities that could complicate care,  lack of social support and financial concerns.   Current PCP-Fransico Sciandra Charissa Bash, [facility] Admissions-1 ED Visits-1  3. Social Determinants  of Health- we discussed that social determinants of health may have significant impacts on health and outcomes for cancer patients.  Today we discussed specific social determinants of performance status, alcohol use, depression, financial needs, food insecurity, housing, interpersonal violence, social connections, stress, tobacco use, and transportation.  After lengthy discussion the following were identified as areas of need:   Based on financial insecurity: We discussed that living with cancer can create tremendous financial burden.  We discussed options for assistance. I asked that if assistance is needed in affording medications or paying bills to please let us know so that we can provide assistance.  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  She has missed several of her appointments due no vacation time due to COVID-19 and lay off.  FMLA paperwork has been received and submitted on her behalf.  Based on food insecurity-we discussed options for food including social services.  We will also notify [facility] to see if cancer center can provide support.  Patient informed of food pantry at cancer center and was provided with care package today.  Please notify nursing if un-met needs.   Based on housing insecurity: we discussed referral to social work. Will also discuss with Elease Etienne to see if [facility] can provide support of utility bills, etc.   Based on lack of social connections-we discussed options for support groups at the cancer center. If interested, please notify nurse navigator to enroll.    Based on concern of stress-we discussed options for managing stress including healthy eating, exercise as well as participating in no charge counseling services at the cancer center and support groups.  If these are of interest, patient can notify either myself or primary nursing team.  Based on transportation need: Mrs. Shrider currently drives herself to and from her appointments.  She does not have any family in the area the closest living relative lives in [place] which is approximately 3 hours away.  We discussed options for transportation including acta, paratransit, bus routes, [employer] transit, taxi/uber/lyft, and cancer center van.  I have notified primary oncology team who will help assist with arranging [name] transportation for appointments when needed. We also discussed options for transportation on short notice/acute visits.   4. Co-morbidities Complicating Care: Past medical history includes hyperlipidemia, hypothyroidism and diabetes.  Initial presentation includes bilateral lower extremity swelling and abdominal distention.  Had CT abdomen and chest revealing cystic lesions enlarged bilateral ovaries measuring 6.2 cm.  Had numerous bulky retroperitoneal lymph nodes and significant ascites.  Had paracentesis with cytology revealing high-grade serous carcinoma.  Recommendation was for neoadjuvant chemotherapy due to bulky retroperitoneal adenopathy.  Plan is for repeat paracentesis due to continued bloating and abdominal distention.  5. Palliative Care- based on stage of cancer and/or identified needs today, I will refer patient to palliative care for goals of care and advanced care planning.  Palliative care consult scheduled for 09/01/2018.  We also discussed the role of the Symptom Management Clinic at [facility] for acute issues and methods of contacting clinic/provider. She denies needing specific assistance at this time and She will be followed by  Mariea Clonts, RN (Nurse  Navigator).    Visit Diagnosis 1. Serous adenocarcinoma [facility])      Patient expressed understanding and was in agreement with this plan. She also understands that She can call clinic at any time with any questions, concerns, or complaints.   A total of (25) minutes of  face-to-face time was spent with this patient with greater than 50% of that time in counseling and care-coordination.  Rulon Abide, AGNP-C [place] [facility] [place] (work cell) [phone] (office)  CC: Dr. Janese Banks

## 2018-08-21 NOTE — Progress Notes (Signed)
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 She presented with symptoms of bilateral lower extremity swelling as well as abdominal distention.GI. This was followed by a CT abdomen and chest imaging with contrast. CT showed cystic lesions of the enlarged bilateral ovaries the largest lesion on the left measuring 6.2 cm. Patient also noted to have numerous bulky retroperitoneal lymph nodes, largest left periaortic node or conglomerates near the superior mesenteric artery measuring at least 4.4 x 2.9 cm. Significant ascites was present. Patient underwent paracentesis and cytology from the fluid showed high-grade serous carcinoma. Tumor cells were positive for CK7, PAX 8, p53 and WT 1.  Patient seen by GYN onc and neoadjuvant chemotherapy recommended due to buly retroperitoneal adenopathy  Interval history-she feels fatigued.  Abdomen feels bloated.  ECOG PS- 1 Pain scale- 0 Opioid associated constipation- no  Review of systems- Review of Systems  Constitutional: Positive for malaise/fatigue. Negative for chills, fever and weight loss.  HENT: Negative for congestion, ear discharge and nosebleeds.   Eyes: Negative for blurred vision.  Respiratory: Negative for cough, hemoptysis, sputum production, shortness of breath and wheezing.   Cardiovascular: Positive for leg swelling.  Negative for chest pain, palpitations, orthopnea and claudication.  Gastrointestinal: Negative for abdominal pain, blood in stool, constipation, diarrhea, heartburn, melena, nausea and vomiting.       Abdominal distention and bloating  Genitourinary: Negative for dysuria, flank pain, frequency, hematuria and urgency.  Musculoskeletal: Negative for back pain, joint pain and myalgias.  Skin: Negative for rash.  Neurological: Negative for dizziness, tingling, focal weakness, seizures, weakness and headaches.  Endo/Heme/Allergies: Does not bruise/bleed easily.  Psychiatric/Behavioral: Negative for depression and suicidal ideas. The patient does not have insomnia.        Allergies  Allergen Reactions   Clarithromycin Nausea Only   Doxycycline     nausea, dizziness.   Penicillins Hives     Past Medical History:  Diagnosis Date   Allergy    Diabetes mellitus without complication ([place])    Diverticulosis    Hyperlipidemia    Hypothyroidism    Thyroid disease      Past Surgical History:  Procedure Laterality Date   BACK SURGERY  2007   Lumbar Surgery   BREAST CYST ASPIRATION Right    Around 10 years ago. Pt thinks it was the right breast but not sure.   CATARACT EXTRACTION Left 2010   Right Eye 2011   COLONOSCOPY WITH PROPOFOL N/A 09/02/2015   Procedure: COLONOSCOPY WITH PROPOFOL;  Surgeon: [name], MD;  Location: [place];  Service: Endoscopy;  Laterality: N/A;  Diabetic - oral meds   ESOPHAGOGASTRODUODENOSCOPY (EGD) WITH PROPOFOL N/A 11/03/2015   Procedure: ESOPHAGOGASTRODUODENOSCOPY (EGD) WITH PROPOFOL;  Surgeon: [name], MD;  Location: [place];  Service: Endoscopy;  Laterality: N/A;   EYE SURGERY Bilateral 06/2014    tear duck catherization ( Dr. Vallarie Mare)   POLYPECTOMY  09/02/2015   Procedure: POLYPECTOMY;  Surgeon: [name],  MD;  Location: [place];  Service: Endoscopy;;   TONSILLECTOMY  1963    Social History    Socioeconomic History   Marital status: Single    Spouse name: Not on file   Number of children: Not on file   Years of education: Not on file   Highest education level: Not on file  Occupational History   Not on file  Social Needs   Financial resource strain: Not on file   Food insecurity    Worry: Not on file    Inability: Not on file   Transportation needs    Medical: Not on file    Non-medical: Not on file  Tobacco Use   Smoking status: Never Smoker   Smokeless tobacco: Never Used  Substance and Sexual Activity   Alcohol use: No   Drug use: No   Sexual activity: Not on file  Lifestyle   Physical activity    Days per week: Not on file    Minutes per session: Not on file   Stress: Not on file  Relationships   Social connections    Talks on phone: Not on file    Gets together: Not on file    Attends religious service: Not on file    Active member of club or organization: Not on file    Attends meetings of clubs or organizations: Not on file    Relationship status: Not on file   Intimate partner violence    Fear of current or ex partner: Not on file    Emotionally abused: Not on file    Physically abused: Not on file    Forced sexual activity: Not on file  Other Topics Concern   Not on file  Social History Narrative   Not on file    Family History  Problem Relation Age of Onset   Congestive Heart Failure Mother    Diabetes Mother    Stroke Mother    Hypothyroidism Father    Cancer Father        lung cancer   Cancer Paternal Grandmother        stomach     Current Outpatient Medications:    aspirin 81 MG tablet, ASPIRIN, '81MG'$  (Oral Tablet Delayed Release)  1 a week for 0 days  Quantity: 0.00;  Refills: 0   Ordered :02-Oct-2010  Doy Hutching ;  Started 12-Mar-2007 Active, Disp: , Rfl:    calcipotriene (DOVONOX) 0.005 % ointment, CALCIPOTRIENE, 0.005% (External Ointment) - Historical Medication  apply to skin daily (0.005 %)  Active, Disp: , Rfl:    clobetasol ointment (TEMOVATE) 0.05 %, CLOBETASOL PROPIONATE, 0.05% (External Ointment) - Historical Medication  apply to skin daily (0.05 %) Active, Disp: , Rfl:    dexamethasone (DECADRON) 0.5 MG/5ML solution, Take 0.5 mg by mouth daily. Use daily one week then off one week, Disp: , Rfl:    dexamethasone (DECADRON) 4 MG tablet, Take 2 tablets (8 mg total) by mouth daily. Start the day after carboplatin chemotherapy for 3 days., Disp: 30 tablet, Rfl: 1   fexofenadine (ALLEGRA) 60 MG tablet, Take 60 mg by mouth daily. Reported on 09/01/2015, Disp: , Rfl:    furosemide (LASIX) 20 MG tablet, Take 1 tablet (20 mg total) by mouth daily., Disp: 20 tablet, Rfl: 0   glipiZIDE (GLUCOTROL) 5 MG tablet, TAKE 1 TABLET BY MOUTH DAILY BEFORE BREAKFAST, Disp: 90 tablet, Rfl: 1   glucose blood (ONE TOUCH ULTRA TEST) test strip, USE TO TEST BLOOD SUGAR  TWICE A DAY AS INSTRUCTED, Disp: 100 each, Rfl: 12   levothyroxine (SYNTHROID) 100 MCG tablet, Take 1 tablet (100 mcg total) by mouth daily., Disp: 90 tablet, Rfl: 1   lidocaine-prilocaine (EMLA) cream, Apply to affected area once, Disp: 30 g, Rfl: 3   LORazepam (ATIVAN) 0.5 MG tablet, Take 1 tablet (0.5 mg total) by mouth every 6 (six) hours as needed (Nausea or vomiting)., Disp: 30 tablet, Rfl: 0   metFORMIN (GLUCOPHAGE) 500 MG tablet, Take 2 tablets (1,000 mg total) by mouth 2 (two) times daily., Disp: 360 tablet, Rfl: 1   MULTIPLE VITAMIN PO, MULTIVITAMINS (Oral Tablet)  1 Every Day for 0 days  Quantity: 0.00;  Refills: 0   Ordered :02-Oct-2010  Doy Hutching ;  Started 12-Mar-2007 Active, Disp: , Rfl:    ondansetron (ZOFRAN) 8 MG tablet, Take 1 tablet (8 mg total) by mouth 2 (two) times daily as needed for refractory nausea / vomiting. Start on day 3 after carboplatin chemo., Disp: 30 tablet, Rfl: 1   ONETOUCH DELICA LANCETS 52D MISC, USE TO TEST BLOOD SUGAR ONCE A DAY, Disp: 100 each, Rfl: 12   prochlorperazine (COMPAZINE)  10 MG tablet, Take 1 tablet (10 mg total) by mouth every 6 (six) hours as needed (Nausea or vomiting)., Disp: 30 tablet, Rfl: 1   simvastatin (ZOCOR) 20 MG tablet, Take 1 tablet (20 mg total) by mouth at bedtime., Disp: 90 tablet, Rfl: 1   spironolactone (ALDACTONE) 50 MG tablet, Take 1 tablet (50 mg total) by mouth daily., Disp: 20 tablet, Rfl: 0  Physical exam:  Vitals:   08/21/18 0840  BP: 103/70  Pulse: 93  Resp: 18  Temp: (!) 97 F (36.1 C)  TempSrc: Tympanic  SpO2: 98%  Weight: 125 lb 4.8 oz (56.8 kg)   Physical Exam Constitutional:      Comments: Patient is thin and cachectic.  Appears in no acute distress  HENT:     Head: Normocephalic and atraumatic.  Eyes:     Pupils: Pupils are equal, round, and reactive to light.  Neck:     Musculoskeletal: Normal range of motion.  Cardiovascular:     Rate and Rhythm: Normal rate and regular rhythm.     Heart sounds: Normal heart sounds.  Pulmonary:     Effort: Pulmonary effort is normal.     Breath sounds: Normal breath sounds.  Abdominal:     Comments: Distended.  Ascites  Musculoskeletal:     Right lower leg: Edema present.     Left lower leg: Edema present.  Skin:    General: Skin is warm and dry.  Neurological:     Mental Status: She is alert and oriented to person, place, and time.      CMP Latest Ref Rng & Units 08/08/2018  Glucose 70 - 99 mg/dL 92  BUN 8 - 23 mg/dL 17  Creatinine 0.44 - 1.00 mg/dL 0.87  Sodium 135 - 145 mmol/L 137  Potassium 3.5 - 5.1 mmol/L 4.1  Chloride 98 - 111 mmol/L 97(L)  CO2 22 - 32 mmol/L 29  Calcium 8.9 - 10.3 mg/dL 8.9  Total Protein 6.5 - 8.1 g/dL 7.2  Total Bilirubin 0.3 - 1.2 mg/dL 0.3  Alkaline Phos 38 - 126 U/L 49  AST 15 - 41 U/L 35  ALT 0 - 44 U/L 23   CBC Latest Ref Rng & Units 08/08/2018  WBC 4.0 - 10.5 K/uL 5.2  Hemoglobin 12.0 - 15.0 g/dL 10.8(L)  Hematocrit 36.0 - 46.0 %  33.8(L)  Platelets 150 - 400 K/uL 306    No images are attached to the encounter.  Dg  Chest 2 View  Result Date: 07/23/2018 CLINICAL DATA:  Fluid retention for 2 weeks. EXAM: CHEST - 2 VIEW COMPARISON:  Chest CT 07/12/2015 FINDINGS: The cardiac silhouette, mediastinal and hilar contours are within normal limits and stable. The lungs are clear of an acute process. No pleural effusions. No worrisome pulmonary lesions. The right nipple shadows overlying the right lower chest. Remote surgical changes from a thoracic spine corpectomy and lateral fusion. No complicating features. IMPRESSION: No acute cardiopulmonary findings. Electronically Signed   By: Marijo Sanes M.D.   On: 07/23/2018 16:56   Ct Chest W Contrast  Result Date: 08/04/2018 CLINICAL DATA:  Diarrhea, abdominal distension, edema EXAM: CT CHEST, ABDOMEN, AND PELVIS WITH CONTRAST TECHNIQUE: Multidetector CT imaging of the chest, abdomen and pelvis was performed following the standard protocol during bolus administration of intravenous contrast. CONTRAST:  58m OMNIPAQUE IOHEXOL 300 MG/ML SOLN, additional oral enteric contrast COMPARISON:  None. FINDINGS: CT CHEST FINDINGS Cardiovascular: No significant vascular findings. Normal heart size. No pericardial effusion. Mediastinum/Nodes: No enlarged mediastinal, hilar, or axillary lymph nodes. Thyroid gland, trachea, and esophagus demonstrate no significant findings. Lungs/Pleura: Lungs are clear. No pleural effusion or pneumothorax. Musculoskeletal: No chest wall mass or suspicious bone lesions identified. Fusion of T7-T10. CT ABDOMEN PELVIS FINDINGS Hepatobiliary: No solid liver abnormality is seen. No gallstones, gallbladder wall thickening, or biliary dilatation. Pancreas: Unremarkable. No pancreatic ductal dilatation or surrounding inflammatory changes. Spleen: Normal in size without significant abnormality. Adrenals/Urinary Tract: Adrenal glands are unremarkable. Nonobstructive inferior pole left nephrolithiasis. Bladder is unremarkable. Stomach/Bowel: Stomach is within normal limits.  Appendix is not clearly visualized and may be absent. No evidence of bowel wall thickening, distention, or inflammatory changes. Vascular/Lymphatic: No significant vascular findings are present. There are numerous bulky retroperitoneal lymph nodes, largest left periaortic nodes or conglomerates near the superior mesenteric artery measuring at least 4.4 x 2.9 cm (series 2, image 64). Reproductive: There are cystic lesions of the enlarged bilateral ovaries, largest lesion on the left measuring at least 6.2 cm (series 2, image 116, series 6, image 76) Other: No abdominal wall hernia or abnormality. Moderate volume four quadrant ascites. Musculoskeletal: No acute or significant osseous findings. IMPRESSION: 1. There are cystic lesions of the enlarged bilateral ovaries, largest lesion on the left measuring at least 6.2 cm (series 2, image 116, series 6, image 76). Findings are highly concerning for ovarian neoplasm, particularly given presence of otherwise unexplained ascites and abdominal lymphadenopathy. 2. There are numerous bulky retroperitoneal lymph nodes, largest left periaortic nodes or conglomerates near the superior mesenteric artery measuring at least 4.4 x 2.9 cm (series 2, image 64), concerning for metastatic disease, or alternately primary malignancy such as lymphoma. 3.  Other chronic and incidental findings as detailed above. These results will be called to the ordering clinician or representative by the Radiologist Assistant, and communication documented in the PACS or zVision Dashboard. Electronically Signed   By: AEddie CandleM.D.   On: 08/04/2018 11:13   [place]dom[place] Complete  Result Date: 07/23/2018 CLINICAL DATA:  Abdominal distension for 1 week with weight gain EXAM: ABDOMEN ULTRASOUND COMPLETE COMPARISON:  07/12/2015 CT chest, abdomen and pelvis. FINDINGS: Gallbladder: Mild diffuse gallbladder wall thickening. No gallbladder distention. No gallstones. No sonographic Murphy sign. Common bile  duct: Diameter: 3 mm Liver: No focal lesion identified. No convincing liver surface irregularity. Normal liver parenchymal echogenicity and echotexture. Portal  vein is patent on color Doppler imaging with normal direction of blood flow towards the liver. IVC: No abnormality visualized. Pancreas: Visualized portion unremarkable. Spleen: Size and appearance within normal limits. Right Kidney: Length: 8.2 cm. Echogenicity within normal limits. No mass or hydronephrosis visualized. Left Kidney: Length: 9.7 cm. Echogenicity within normal limits. No mass or hydronephrosis visualized. Abdominal aorta: No aneurysm visualized. Other findings: Large volume ascites, most prominent in the left lower quadrant. IMPRESSION: 1. Large volume ascites. 2. No overt morphologic changes of hepatic cirrhosis. 3. Normal size spleen. 4. Nonspecific mild diffuse gallbladder wall thickening, suspect noninflammatory edema. No cholelithiasis. No biliary ductal dilatation. Electronically Signed   By: Ilona Sorrel M.D.   On: 07/23/2018 19:45   Ct Abdomen Pelvis W Contrast  Result Date: 08/04/2018 CLINICAL DATA:  Diarrhea, abdominal distension, edema EXAM: CT CHEST, ABDOMEN, AND PELVIS WITH CONTRAST TECHNIQUE: Multidetector CT imaging of the chest, abdomen and pelvis was performed following the standard protocol during bolus administration of intravenous contrast. CONTRAST:  47m OMNIPAQUE IOHEXOL 300 MG/ML SOLN, additional oral enteric contrast COMPARISON:  None. FINDINGS: CT CHEST FINDINGS Cardiovascular: No significant vascular findings. Normal heart size. No pericardial effusion. Mediastinum/Nodes: No enlarged mediastinal, hilar, or axillary lymph nodes. Thyroid gland, trachea, and esophagus demonstrate no significant findings. Lungs/Pleura: Lungs are clear. No pleural effusion or pneumothorax. Musculoskeletal: No chest wall mass or suspicious bone lesions identified. Fusion of T7-T10. CT ABDOMEN PELVIS FINDINGS Hepatobiliary: No solid  liver abnormality is seen. No gallstones, gallbladder wall thickening, or biliary dilatation. Pancreas: Unremarkable. No pancreatic ductal dilatation or surrounding inflammatory changes. Spleen: Normal in size without significant abnormality. Adrenals/Urinary Tract: Adrenal glands are unremarkable. Nonobstructive inferior pole left nephrolithiasis. Bladder is unremarkable. Stomach/Bowel: Stomach is within normal limits. Appendix is not clearly visualized and may be absent. No evidence of bowel wall thickening, distention, or inflammatory changes. Vascular/Lymphatic: No significant vascular findings are present. There are numerous bulky retroperitoneal lymph nodes, largest left periaortic nodes or conglomerates near the superior mesenteric artery measuring at least 4.4 x 2.9 cm (series 2, image 64). Reproductive: There are cystic lesions of the enlarged bilateral ovaries, largest lesion on the left measuring at least 6.2 cm (series 2, image 116, series 6, image 76) Other: No abdominal wall hernia or abnormality. Moderate volume four quadrant ascites. Musculoskeletal: No acute or significant osseous findings. IMPRESSION: 1. There are cystic lesions of the enlarged bilateral ovaries, largest lesion on the left measuring at least 6.2 cm (series 2, image 116, series 6, image 76). Findings are highly concerning for ovarian neoplasm, particularly given presence of otherwise unexplained ascites and abdominal lymphadenopathy. 2. There are numerous bulky retroperitoneal lymph nodes, largest left periaortic nodes or conglomerates near the superior mesenteric artery measuring at least 4.4 x 2.9 cm (series 2, image 64), concerning for metastatic disease, or alternately primary malignancy such as lymphoma. 3.  Other chronic and incidental findings as detailed above. These results will be called to the ordering clinician or representative by the Radiologist Assistant, and communication documented in the PACS or zVision Dashboard.  Electronically Signed   By: AEddie CandleM.D.   On: 08/04/2018 11:13   [place]acentesis  Result Date: 07/29/2018 INDICATION: 63year old female with a history of recurrent ascites EXAM: ULTRASOUND GUIDED  PARACENTESIS MEDICATIONS: None. COMPLICATIONS: None PROCEDURE: Informed written consent was obtained from the patient after a discussion of the risks, benefits and alternatives to treatment. A timeout was performed prior to the initiation of the procedure. Initial ultrasound scanning demonstrates a moderate amount  of ascites within the right lower abdominal quadrant. The right lower abdomen was prepped and draped in the usual sterile fashion. 1% lidocaine with epinephrine was used for local anesthesia. Following this, a 8 Fr Safe-T-Centesis catheter was introduced. An ultrasound image was saved for documentation purposes. The paracentesis was performed. The catheter was removed and a dressing was applied. The patient tolerated the procedure well without immediate post procedural complication. FINDINGS: A total of approximately 5 L of thin yellow fluid was removed. IMPRESSION: Successful status post ultrasound-guided paracentesis. Signed, Dulcy Fanny. Dellia Nims, RPVI [facility] Radiology Electronically Signed   By: Corrie Mckusick D.O.   On: 07/29/2018 11:31     Assessment and plan- Patient is a 63 y.o. 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  She is here for on treatment assessment prior to cycle 1 of carbotaxol Avastin chemotherapy  Counts okay to proceed with cycle 1 of carbotaxol Avastin chemotherapy with ongoing Neulasta support.  I will see her back in 10 days time to see how she tolerated her first cycle.  Possible IV fluids on that day.  Normocytic anemia: She does have baseline anemia likely multifactorial secondary to hypothyroidism as well as underlying malignancy.  Continue to monitor  We will  plan to get therapeutic abdominal paracentesis for symptomatic relief within the next week.  Patient will get her second carbotaxol Avastin chemotherapy in 3 weeks.  CBC with differential, CMP and urine protein   Visit Diagnosis 1. Encounter for antineoplastic chemotherapy   2. Serous adenocarcinoma ([place])   3. Malignant ascites   4. Anemia of chronic disease      Dr. Randa Evens, MD, MPH [facility] at [facility] [phone] 08/21/2018 7:50 AM

## 2018-08-22 ENCOUNTER — Telehealth: Payer: Self-pay | Admitting: *Deleted

## 2018-08-22 DIAGNOSIS — E538 Deficiency of other specified B group vitamins: Secondary | ICD-10-CM | POA: Diagnosis not present

## 2018-08-22 DIAGNOSIS — C561 Malignant neoplasm of right ovary: Secondary | ICD-10-CM | POA: Diagnosis present

## 2018-08-22 DIAGNOSIS — R180 Malignant ascites: Secondary | ICD-10-CM | POA: Diagnosis not present

## 2018-08-22 DIAGNOSIS — R590 Localized enlarged lymph nodes: Secondary | ICD-10-CM | POA: Diagnosis not present

## 2018-08-22 DIAGNOSIS — J309 Allergic rhinitis, unspecified: Secondary | ICD-10-CM | POA: Diagnosis not present

## 2018-08-22 DIAGNOSIS — R809 Proteinuria, unspecified: Secondary | ICD-10-CM | POA: Diagnosis not present

## 2018-08-22 DIAGNOSIS — Z7689 Persons encountering health services in other specified circumstances: Secondary | ICD-10-CM | POA: Diagnosis not present

## 2018-08-22 DIAGNOSIS — E119 Type 2 diabetes mellitus without complications: Secondary | ICD-10-CM | POA: Diagnosis not present

## 2018-08-22 DIAGNOSIS — Z5111 Encounter for antineoplastic chemotherapy: Secondary | ICD-10-CM | POA: Diagnosis not present

## 2018-08-22 DIAGNOSIS — E039 Hypothyroidism, unspecified: Secondary | ICD-10-CM | POA: Diagnosis not present

## 2018-08-22 DIAGNOSIS — R634 Abnormal weight loss: Secondary | ICD-10-CM | POA: Diagnosis not present

## 2018-08-22 DIAGNOSIS — Z515 Encounter for palliative care: Secondary | ICD-10-CM | POA: Diagnosis not present

## 2018-08-22 DIAGNOSIS — E7800 Pure hypercholesterolemia, unspecified: Secondary | ICD-10-CM | POA: Diagnosis not present

## 2018-08-22 DIAGNOSIS — Z7984 Long term (current) use of oral hypoglycemic drugs: Secondary | ICD-10-CM | POA: Diagnosis not present

## 2018-08-22 DIAGNOSIS — I10 Essential (primary) hypertension: Secondary | ICD-10-CM | POA: Diagnosis not present

## 2018-08-22 DIAGNOSIS — Z79899 Other long term (current) drug therapy: Secondary | ICD-10-CM | POA: Diagnosis not present

## 2018-08-22 DIAGNOSIS — E785 Hyperlipidemia, unspecified: Secondary | ICD-10-CM | POA: Diagnosis not present

## 2018-08-22 DIAGNOSIS — D649 Anemia, unspecified: Secondary | ICD-10-CM | POA: Diagnosis not present

## 2018-08-22 DIAGNOSIS — Z5112 Encounter for antineoplastic immunotherapy: Secondary | ICD-10-CM | POA: Diagnosis not present

## 2018-08-22 DIAGNOSIS — E1136 Type 2 diabetes mellitus with diabetic cataract: Secondary | ICD-10-CM | POA: Diagnosis not present

## 2018-08-22 DIAGNOSIS — C562 Malignant neoplasm of left ovary: Secondary | ICD-10-CM | POA: Diagnosis present

## 2018-08-22 DIAGNOSIS — Z7982 Long term (current) use of aspirin: Secondary | ICD-10-CM | POA: Diagnosis not present

## 2018-08-22 NOTE — Telephone Encounter (Signed)
Patient informed that she has 3 more chemotherapy treatments then surgery and then another 3 chemotherapy treatment per Dr Janese Banks She will have her HR rep fax over a FMLA form for [place] to complete for her

## 2018-08-22 NOTE — Telephone Encounter (Signed)
Patient called asking how many more treatment she is going to get so that she can let her employer know

## 2018-08-22 NOTE — Telephone Encounter (Signed)
Total 3

## 2018-08-25 ENCOUNTER — Ambulatory Visit: Payer: Commercial Managed Care - PPO

## 2018-08-25 ENCOUNTER — Ambulatory Visit
Admission: RE | Admit: 2018-08-25 | Discharge: 2018-08-25 | Disposition: A | Discharge status: Home / Self Care | Payer: Commercial Managed Care - PPO | Source: Ambulatory Visit | Attending: Oncology | Admitting: Oncology

## 2018-08-25 ENCOUNTER — Other Ambulatory Visit: Payer: Self-pay

## 2018-08-25 DIAGNOSIS — R180 Malignant ascites: Secondary | ICD-10-CM | POA: Insufficient documentation

## 2018-08-26 LAB — PROTEIN ELECTRO, RANDOM URINE
Albumin ELP, Urine: UNDETERMINED %
Alpha-1-Globulin, U: UNDETERMINED %
Alpha-2-Globulin, U: UNDETERMINED %
Beta Globulin, U: UNDETERMINED %
Gamma Globulin, U: UNDETERMINED %
M Component, Ur: UNDETERMINED %
Total Protein, Urine: 27.2 mg/dL

## 2018-08-26 LAB — NOVEL CORONAVIRUS, NAA: SARS-CoV-2, NAA: NOT DETECTED

## 2018-09-01 ENCOUNTER — Inpatient Hospital Stay: Payer: Commercial Managed Care - PPO

## 2018-09-01 ENCOUNTER — Inpatient Hospital Stay (HOSPITAL_BASED_OUTPATIENT_CLINIC_OR_DEPARTMENT_OTHER): Payer: Commercial Managed Care - PPO | Admitting: Oncology

## 2018-09-01 ENCOUNTER — Other Ambulatory Visit: Payer: Self-pay

## 2018-09-01 ENCOUNTER — Inpatient Hospital Stay (HOSPITAL_BASED_OUTPATIENT_CLINIC_OR_DEPARTMENT_OTHER): Payer: Commercial Managed Care - PPO | Admitting: Hospice and Palliative Medicine

## 2018-09-01 VITALS — BP 113/79 | HR 108 | Temp 99.6°F | Resp 18 | Wt 114.5 lb

## 2018-09-01 DIAGNOSIS — Z515 Encounter for palliative care: Secondary | ICD-10-CM

## 2018-09-01 DIAGNOSIS — C562 Malignant neoplasm of left ovary: Secondary | ICD-10-CM | POA: Diagnosis not present

## 2018-09-01 DIAGNOSIS — C561 Malignant neoplasm of right ovary: Secondary | ICD-10-CM

## 2018-09-01 DIAGNOSIS — C801 Malignant (primary) neoplasm, unspecified: Secondary | ICD-10-CM

## 2018-09-01 DIAGNOSIS — Z7189 Other specified counseling: Secondary | ICD-10-CM

## 2018-09-01 LAB — COMPREHENSIVE METABOLIC PANEL
ALT: 20 U/L (ref 0–44)
AST: 41 U/L (ref 15–41)
Albumin: 3 g/dL — ABNORMAL LOW (ref 3.5–5.0)
Alkaline Phosphatase: 127 U/L — ABNORMAL HIGH (ref 38–126)
Anion gap: 11 (ref 5–15)
BUN: 12 mg/dL (ref 8–23)
CO2: 27 mmol/L (ref 22–32)
Calcium: 9.2 mg/dL (ref 8.9–10.3)
Chloride: 101 mmol/L (ref 98–111)
Creatinine, Ser: 0.69 mg/dL (ref 0.44–1.00)
GFR calc Af Amer: >60 mL/min (ref >60)
GFR calc non Af Amer: >60 mL/min (ref >60)
Glucose, Bld: 94 mg/dL (ref 70–99)
Potassium: 4.2 mmol/L (ref 3.5–5.1)
Sodium: 139 mmol/L (ref 135–145)
Total Bilirubin: 0.5 mg/dL (ref 0.3–1.2)
Total Protein: 7.3 g/dL (ref 6.5–8.1)

## 2018-09-01 LAB — CBC WITH DIFFERENTIAL/PLATELET
Abs Immature Granulocytes: 1.97 10*3/uL — ABNORMAL HIGH (ref 0.00–0.07)
Basophils Absolute: 0 10*3/uL (ref 0.0–0.1)
Basophils Relative: 0 %
Eosinophils Absolute: 0.1 10*3/uL (ref 0.0–0.5)
Eosinophils Relative: 1 %
HCT: 32.9 % — ABNORMAL LOW (ref 36.0–46.0)
Hemoglobin: 10.5 g/dL — ABNORMAL LOW (ref 12.0–15.0)
Immature Granulocytes: 9 %
Lymphocytes Relative: 8 %
Lymphs Abs: 1.7 10*3/uL (ref 0.7–4.0)
MCH: 32.4 pg (ref 26.0–34.0)
MCHC: 31.9 g/dL (ref 30.0–36.0)
MCV: 101.5 fL — ABNORMAL HIGH (ref 80.0–100.0)
Monocytes Absolute: 1.7 10*3/uL — ABNORMAL HIGH (ref 0.1–1.0)
Monocytes Relative: 8 %
Neutro Abs: 16.4 10*3/uL — ABNORMAL HIGH (ref 1.7–7.7)
Neutrophils Relative %: 74 %
Platelets: 186 10*3/uL (ref 150–400)
RBC: 3.24 MIL/uL — ABNORMAL LOW (ref 3.87–5.11)
RDW: 15.6 % — ABNORMAL HIGH (ref 11.5–15.5)
Smear Review: NORMAL
WBC Morphology: INCREASED
WBC: 21.9 10*3/uL — ABNORMAL HIGH (ref 4.0–10.5)
nRBC: 0.4 % — ABNORMAL HIGH (ref 0.0–0.2)

## 2018-09-01 NOTE — Progress Notes (Signed)
[place]  [phone] [phone]   Name: Meredith Jones Date: 09/01/2018 MRN: [id number]  DOB: [birth year]  Patient Care Team: Mar Daring, PA-C as PCP - General (Family Medicine) Clent Jacks, RN as Oncology Nurse Navigator    REASON FOR CONSULTATION: Palliative Care consult requested for this 63 y.o. 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  Abdominal CT revealed cystic lesions in the bilateral ovaries with numerous bulky retroperitoneal lymph nodes and a large amount of ascites.  She has required therapeutic paracentesis.  Patient is being followed by GYN oncology with plan for neoadjuvant chemotherapy and possible later surgery.  Patient was referred to palliative care to help address goals and manage ongoing symptoms.   SOCIAL HISTORY:     reports that she has never smoked. She has never used smokeless tobacco. She reports that she does not drink alcohol or use drugs.   Patient is not married and has no children.  Her parents are deceased and she has no siblings.  She lives at home with a boyfriend.  Patient previously worked as a Scientist, water quality at [employer].  ADVANCE DIRECTIVES:  Does not have  CODE STATUS:   PAST MEDICAL HISTORY: Past Medical History:  Diagnosis Date   Allergy    Diabetes mellitus without complication ([place])    Diverticulosis    Hyperlipidemia    Hypothyroidism    Thyroid disease     PAST SURGICAL HISTORY:  Past Surgical History:  Procedure Laterality Date   BACK SURGERY  2007   Lumbar Surgery   BREAST CYST ASPIRATION Right    Around 10 years ago. Pt thinks it was the right breast but not sure.   CATARACT EXTRACTION Left 2010   Right Eye 2011   COLONOSCOPY WITH PROPOFOL N/A 09/02/2015   Procedure: COLONOSCOPY WITH PROPOFOL;  Surgeon: [name], MD;  Location: [facility];   Service: Endoscopy;  Laterality: N/A;  Diabetic - oral meds   ESOPHAGOGASTRODUODENOSCOPY (EGD) WITH PROPOFOL N/A 11/03/2015   Procedure: ESOPHAGOGASTRODUODENOSCOPY (EGD) WITH PROPOFOL;  Surgeon: [name], MD;  Location: [facility];  Service: Endoscopy;  Laterality: N/A;   EYE SURGERY Bilateral 06/2014    tear duck catherization ( Dr. Vallarie Mare)   POLYPECTOMY  09/02/2015   Procedure: POLYPECTOMY;  Surgeon: [name], MD;  Location: [facility];  Service: Endoscopy;;   TONSILLECTOMY  1963    HEMATOLOGY/ONCOLOGY HISTORY:  Oncology History  Serous adenocarcinoma ([place])  08/13/2018 Initial Diagnosis   Serous adenocarcinoma ([place])   08/14/2018 Cancer Staging   Staging form: Exocrine Pancreas, AJCC 8th Edition - Clinical stage from 08/14/2018: Stage III (cT3, cN2, cM0) - Signed by Sindy Guadeloupe, MD on 08/17/2018   08/21/2018 -  Chemotherapy   The patient had palonosetron (ALOXI) injection 0.25 mg, 0.25 mg, Intravenous,  Once, 1 of 6 cycles pegfilgrastim (NEULASTA ONPRO KIT) injection 6 mg, 6 mg, Subcutaneous, Once, 1 of 6 cycles bevacizumab (AVASTIN) 800 mg in sodium chloride 0.9 % 100 mL chemo infusion, 15 mg/kg = 800 mg (100 % of original dose 15 mg/kg), Intravenous,  Once, 1 of 6 cycles Dose modification: 15 mg/kg (original dose 15 mg/kg, Cycle 1) CARBOplatin (PARAPLATIN) 430 mg in sodium chloride 0.9 % 250 mL chemo infusion, 430 mg (100 % of original dose 429.5 mg), Intravenous,  Once, 1 of 6 cycles Dose modification:   (original dose 429.5 mg, Cycle 1)  PACLitaxel (TAXOL) 270 mg in sodium chloride 0.9 % 250 mL chemo infusion (> 81m/m2), 175 mg/m2 = 270 mg, Intravenous,  Once, 1 of 6 cycles fosaprepitant (EMEND) 150 mg, dexamethasone (DECADRON) 12 mg in sodium chloride 0.9 % 145 mL IVPB, , Intravenous,  Once, 1 of 6 cycles  for chemotherapy treatment.      ALLERGIES:  is allergic to clarithromycin; doxycycline; and penicillins.  MEDICATIONS:  Current Outpatient  Medications  Medication Sig Dispense Refill   aspirin 81 MG tablet ASPIRIN, 81MG (Oral Tablet Delayed Release)  1 a week for 0 days  Quantity: 0.00;  Refills: 0   Ordered :02-Oct-2010  CDoy Hutching;  Started 12-Mar-2007 Active     calcipotriene (DOVONOX) 0.005 % ointment CALCIPOTRIENE, 0.005% (External Ointment) - Historical Medication  apply to skin daily (0.005 %) Active     clobetasol ointment (TEMOVATE) 0.05 % CLOBETASOL PROPIONATE, 0.05% (External Ointment) - Historical Medication  apply to skin daily (0.05 %) Active     dexamethasone (DECADRON) 4 MG tablet Take 2 tablets (8 mg total) by mouth daily. Start the day after carboplatin chemotherapy for 3 days. 30 tablet 1   fexofenadine (ALLEGRA) 60 MG tablet Take 60 mg by mouth daily. Reported on 09/01/2015     furosemide (LASIX) 20 MG tablet Take 1 tablet (20 mg total) by mouth daily. 20 tablet 0   glipiZIDE (GLUCOTROL) 5 MG tablet TAKE 1 TABLET BY MOUTH DAILY BEFORE BREAKFAST 90 tablet 1   glucose blood (ONE TOUCH ULTRA TEST) test strip USE TO TEST BLOOD SUGAR TWICE A DAY AS INSTRUCTED 100 each 12   levothyroxine (SYNTHROID) 100 MCG tablet Take 1 tablet (100 mcg total) by mouth daily. 90 tablet 1   lidocaine-prilocaine (EMLA) cream Apply to affected area once (Patient not taking: Reported on 09/01/2018) 30 g 3   LORazepam (ATIVAN) 0.5 MG tablet Take 1 tablet (0.5 mg total) by mouth every 6 (six) hours as needed (Nausea or vomiting). 30 tablet 0   metFORMIN (GLUCOPHAGE) 500 MG tablet Take 2 tablets (1,000 mg total) by mouth 2 (two) times daily. 360 tablet 1   MULTIPLE VITAMIN PO MULTIVITAMINS (Oral Tablet)  1 Every Day for 0 days  Quantity: 0.00;  Refills: 0   Ordered :02-Oct-2010  CDoy Hutching;  Started 12-Mar-2007 Active     ondansetron (ZOFRAN) 8 MG tablet Take 1 tablet (8 mg total) by mouth 2 (two) times daily as needed for refractory nausea / vomiting. Start on day 3 after carboplatin chemo. 30 tablet 1   ONETOUCH  DELICA LANCETS 332ZMISC USE TO TEST BLOOD SUGAR ONCE A DAY 100 each 12   prochlorperazine (COMPAZINE) 10 MG tablet Take 1 tablet (10 mg total) by mouth every 6 (six) hours as needed (Nausea or vomiting). 30 tablet 1   simvastatin (ZOCOR) 20 MG tablet Take 1 tablet (20 mg total) by mouth at bedtime. 90 tablet 1   spironolactone (ALDACTONE) 50 MG tablet Take 1 tablet (50 mg total) by mouth daily. 20 tablet 0   No current facility-administered medications for this visit.     VITAL SIGNS: There were no vitals taken for this visit. There were no vitals filed for this visit.  Estimated body mass index is 20.28 kg/m as calculated from the following:   Height as of 08/13/18: _0  (1.6 m).   Weight as of an earlier encounter on 09/01/18: 114 lb 8 oz (51.9 kg).  LABS: CBC:    Component Value Date/Time   WBC  21.9 (H) 09/01/2018 1029   HGB 10.5 (L) 09/01/2018 1029   HGB 10.2 (L) 05/31/2017 0836   HCT 32.9 (L) 09/01/2018 1029   HCT 31.4 (L) 05/31/2017 0836   PLT 186 09/01/2018 1029   PLT 374 05/31/2017 0836   MCV 101.5 (H) 09/01/2018 1029   MCV 100 (H) 05/31/2017 0836   NEUTROABS 16.4 (H) 09/01/2018 1029   NEUTROABS 3.4 05/31/2017 0836   LYMPHSABS 1.7 09/01/2018 1029   LYMPHSABS 1.0 05/31/2017 0836   MONOABS 1.7 (H) 09/01/2018 1029   EOSABS 0.1 09/01/2018 1029   EOSABS 0.4 05/31/2017 0836   BASOSABS 0.0 09/01/2018 1029   BASOSABS 0.0 05/31/2017 0836   Comprehensive Metabolic Panel:    Component Value Date/Time   NA 139 09/01/2018 1029   NA 142 05/31/2017 0836   K 4.2 09/01/2018 1029   CL 101 09/01/2018 1029   CO2 27 09/01/2018 1029   BUN 12 09/01/2018 1029   BUN 9 05/31/2017 0836   CREATININE 0.69 09/01/2018 1029   CREATININE 0.57 11/13/2016 0933   GLUCOSE 94 09/01/2018 1029   CALCIUM 9.2 09/01/2018 1029   AST 41 09/01/2018 1029   ALT 20 09/01/2018 1029   ALKPHOS 127 (H) 09/01/2018 1029   BILITOT 0.5 09/01/2018 1029   BILITOT <0.2 05/31/2017 0836   PROT 7.3 09/01/2018  1029   PROT 6.1 05/31/2017 0836   ALBUMIN 3.0 (L) 09/01/2018 1029   ALBUMIN 3.4 (L) 05/31/2017 0836    RADIOGRAPHIC STUDIES: Ct Chest W Contrast  Result Date: 08/04/2018 CLINICAL DATA:  Diarrhea, abdominal distension, edema EXAM: CT CHEST, ABDOMEN, AND PELVIS WITH CONTRAST TECHNIQUE: Multidetector CT imaging of the chest, abdomen and pelvis was performed following the standard protocol during bolus administration of intravenous contrast. CONTRAST:  41m OMNIPAQUE IOHEXOL 300 MG/ML SOLN, additional oral enteric contrast COMPARISON:  None. FINDINGS: CT CHEST FINDINGS Cardiovascular: No significant vascular findings. Normal heart size. No pericardial effusion. Mediastinum/Nodes: No enlarged mediastinal, hilar, or axillary lymph nodes. Thyroid gland, trachea, and esophagus demonstrate no significant findings. Lungs/Pleura: Lungs are clear. No pleural effusion or pneumothorax. Musculoskeletal: No chest wall mass or suspicious bone lesions identified. Fusion of T7-T10. CT ABDOMEN PELVIS FINDINGS Hepatobiliary: No solid liver abnormality is seen. No gallstones, gallbladder wall thickening, or biliary dilatation. Pancreas: Unremarkable. No pancreatic ductal dilatation or surrounding inflammatory changes. Spleen: Normal in size without significant abnormality. Adrenals/Urinary Tract: Adrenal glands are unremarkable. Nonobstructive inferior pole left nephrolithiasis. Bladder is unremarkable. Stomach/Bowel: Stomach is within normal limits. Appendix is not clearly visualized and may be absent. No evidence of bowel wall thickening, distention, or inflammatory changes. Vascular/Lymphatic: No significant vascular findings are present. There are numerous bulky retroperitoneal lymph nodes, largest left periaortic nodes or conglomerates near the superior mesenteric artery measuring at least 4.4 x 2.9 cm (series 2, image 64). Reproductive: There are cystic lesions of the enlarged bilateral ovaries, largest lesion on the left  measuring at least 6.2 cm (series 2, image 116, series 6, image 76) Other: No abdominal wall hernia or abnormality. Moderate volume four quadrant ascites. Musculoskeletal: No acute or significant osseous findings. IMPRESSION: 1. There are cystic lesions of the enlarged bilateral ovaries, largest lesion on the left measuring at least 6.2 cm (series 2, image 116, series 6, image 76). Findings are highly concerning for ovarian neoplasm, particularly given presence of otherwise unexplained ascites and abdominal lymphadenopathy. 2. There are numerous bulky retroperitoneal lymph nodes, largest left periaortic nodes or conglomerates near the superior mesenteric artery measuring at least 4.4 x 2.9 cm (  series 2, image 64), concerning for metastatic disease, or alternately primary malignancy such as lymphoma. 3.  Other chronic and incidental findings as detailed above. These results will be called to the ordering clinician or representative by the Radiologist Assistant, and communication documented in the PACS or zVision Dashboard. Electronically Signed   By: Eddie Candle M.D.   On: 08/04/2018 11:13   Ct Abdomen Pelvis W Contrast  Result Date: 08/04/2018 CLINICAL DATA:  Diarrhea, abdominal distension, edema EXAM: CT CHEST, ABDOMEN, AND PELVIS WITH CONTRAST TECHNIQUE: Multidetector CT imaging of the chest, abdomen and pelvis was performed following the standard protocol during bolus administration of intravenous contrast. CONTRAST:  105m OMNIPAQUE IOHEXOL 300 MG/ML SOLN, additional oral enteric contrast COMPARISON:  None. FINDINGS: CT CHEST FINDINGS Cardiovascular: No significant vascular findings. Normal heart size. No pericardial effusion. Mediastinum/Nodes: No enlarged mediastinal, hilar, or axillary lymph nodes. Thyroid gland, trachea, and esophagus demonstrate no significant findings. Lungs/Pleura: Lungs are clear. No pleural effusion or pneumothorax. Musculoskeletal: No chest wall mass or suspicious bone lesions  identified. Fusion of T7-T10. CT ABDOMEN PELVIS FINDINGS Hepatobiliary: No solid liver abnormality is seen. No gallstones, gallbladder wall thickening, or biliary dilatation. Pancreas: Unremarkable. No pancreatic ductal dilatation or surrounding inflammatory changes. Spleen: Normal in size without significant abnormality. Adrenals/Urinary Tract: Adrenal glands are unremarkable. Nonobstructive inferior pole left nephrolithiasis. Bladder is unremarkable. Stomach/Bowel: Stomach is within normal limits. Appendix is not clearly visualized and may be absent. No evidence of bowel wall thickening, distention, or inflammatory changes. Vascular/Lymphatic: No significant vascular findings are present. There are numerous bulky retroperitoneal lymph nodes, largest left periaortic nodes or conglomerates near the superior mesenteric artery measuring at least 4.4 x 2.9 cm (series 2, image 64). Reproductive: There are cystic lesions of the enlarged bilateral ovaries, largest lesion on the left measuring at least 6.2 cm (series 2, image 116, series 6, image 76) Other: No abdominal wall hernia or abnormality. Moderate volume four quadrant ascites. Musculoskeletal: No acute or significant osseous findings. IMPRESSION: 1. There are cystic lesions of the enlarged bilateral ovaries, largest lesion on the left measuring at least 6.2 cm (series 2, image 116, series 6, image 76). Findings are highly concerning for ovarian neoplasm, particularly given presence of otherwise unexplained ascites and abdominal lymphadenopathy. 2. There are numerous bulky retroperitoneal lymph nodes, largest left periaortic nodes or conglomerates near the superior mesenteric artery measuring at least 4.4 x 2.9 cm (series 2, image 64), concerning for metastatic disease, or alternately primary malignancy such as lymphoma. 3.  Other chronic and incidental findings as detailed above. These results will be called to the ordering clinician or representative by the  Radiologist Assistant, and communication documented in the PACS or zVision Dashboard. Electronically Signed   By: AEddie CandleM.D.   On: 08/04/2018 11:13   [place]acentesis  Result Date: 08/25/2018 INDICATION: Malignant ascites secondary to ovarian carcinoma. EXAM: ULTRASOUND GUIDED PARACENTESIS MEDICATIONS: None. COMPLICATIONS: None immediate. PROCEDURE: Informed written consent was obtained from the patient after a discussion of the risks, benefits and alternatives to treatment. A timeout was performed prior to the initiation of the procedure. Initial ultrasound was performed to localize ascites. The right mid abdomen was prepped and draped in the usual sterile fashion. 1% was used for local anesthesia. Following this, a 6 Fr Safe-T-Centesis catheter was introduced. An ultrasound image was saved for documentation purposes. The paracentesis was performed. The catheter was removed and a dressing was applied. The patient tolerated the procedure well without immediate post procedural complication. FINDINGS: A  total of approximately 3.9 L of bloody fluid was removed. IMPRESSION: Successful ultrasound-guided paracentesis yielding 3.9 liters of peritoneal fluid. Electronically Signed   By: Aletta Edouard M.D.   On: 08/25/2018 12:31    PERFORMANCE STATUS (ECOG) : 0 - Asymptomatic  Review of Systems Unless otherwise noted, a complete review of systems is negative.  Physical Exam General: NAD,  thin Pulmonary: Unlabored breathing Abdomen: Distended Extremities: Bilateral lower extremity edema Skin: no rashes Neurological: Weakness but otherwise nonfocal  IMPRESSION: I met with patient today in the clinic.  I introduced palliative care services and attempted to establish therapeutic rapport.  Patient recognizes that her ovarian cancer is advanced but is hopeful that she will respond well to treatment and be a candidate for future resection.  She says she is trying to be optimistic and maintain a  positive outlook.  Her goals seem aligned with the current scope of treatment.  She denies any distressing symptoms today.  She has bilateral lower extremity edema but it is improved.  She has occasional gastrointestinal bloating and discomfort but none recently.  Oral intake is reportedly adequate.  Weight is down slightly to 114 pounds from previous weight of 118 pounds 1 month ago.  Functionally, patient reports being independent with all care in the home.  She does live at home with her significant other who is available to help if needed.  Patient feels she is coping reasonably well with her diagnosis and treatment.  She feels she has good support from her significant other.  We discussed advance care planning.  Patient is interested in establishing plans for future care.  She would want her significant other to be her decision-maker if necessary.  Today, I reviewed with her a MOST Form, HC POA, and living will and she took these documents home to discuss with her significant other.  PLAN: -Continue current scope of treatment -MOST Form/ACP documents reviewed and will need to be completed during a future visit -RTC in 1 month   Patient expressed understanding and was in agreement with this plan. She also understands that She can call the clinic at any time with any questions, concerns, or complaints.     Time Total: 30 minutes  Visit consisted of counseling and education dealing with the complex and emotionally intense issues of symptom management and palliative care in the setting of serious and potentially life-threatening illness.Greater than 50%  of this time was spent counseling and coordinating care related to the above assessment and plan.  Signed by: Altha Harm, PhD, NP-C [phone] (Work Cell)

## 2018-09-02 ENCOUNTER — Encounter: Payer: Self-pay | Admitting: Oncology

## 2018-09-02 DIAGNOSIS — E538 Deficiency of other specified B group vitamins: Secondary | ICD-10-CM | POA: Diagnosis not present

## 2018-09-02 DIAGNOSIS — I10 Essential (primary) hypertension: Secondary | ICD-10-CM | POA: Diagnosis not present

## 2018-09-02 DIAGNOSIS — Z7982 Long term (current) use of aspirin: Secondary | ICD-10-CM | POA: Diagnosis not present

## 2018-09-02 DIAGNOSIS — Z5112 Encounter for antineoplastic immunotherapy: Secondary | ICD-10-CM | POA: Diagnosis not present

## 2018-09-02 DIAGNOSIS — E7800 Pure hypercholesterolemia, unspecified: Secondary | ICD-10-CM | POA: Diagnosis not present

## 2018-09-02 DIAGNOSIS — Z79899 Other long term (current) drug therapy: Secondary | ICD-10-CM | POA: Diagnosis not present

## 2018-09-02 DIAGNOSIS — D649 Anemia, unspecified: Secondary | ICD-10-CM | POA: Diagnosis not present

## 2018-09-02 DIAGNOSIS — C562 Malignant neoplasm of left ovary: Secondary | ICD-10-CM | POA: Diagnosis present

## 2018-09-02 DIAGNOSIS — R590 Localized enlarged lymph nodes: Secondary | ICD-10-CM | POA: Diagnosis not present

## 2018-09-02 DIAGNOSIS — E785 Hyperlipidemia, unspecified: Secondary | ICD-10-CM | POA: Diagnosis not present

## 2018-09-02 DIAGNOSIS — R634 Abnormal weight loss: Secondary | ICD-10-CM | POA: Diagnosis not present

## 2018-09-02 DIAGNOSIS — R809 Proteinuria, unspecified: Secondary | ICD-10-CM | POA: Diagnosis not present

## 2018-09-02 DIAGNOSIS — E1136 Type 2 diabetes mellitus with diabetic cataract: Secondary | ICD-10-CM | POA: Diagnosis not present

## 2018-09-02 DIAGNOSIS — R180 Malignant ascites: Secondary | ICD-10-CM | POA: Diagnosis not present

## 2018-09-02 DIAGNOSIS — Z5111 Encounter for antineoplastic chemotherapy: Secondary | ICD-10-CM | POA: Diagnosis not present

## 2018-09-02 DIAGNOSIS — E039 Hypothyroidism, unspecified: Secondary | ICD-10-CM | POA: Diagnosis not present

## 2018-09-02 DIAGNOSIS — Z7689 Persons encountering health services in other specified circumstances: Secondary | ICD-10-CM | POA: Diagnosis not present

## 2018-09-02 DIAGNOSIS — Z7984 Long term (current) use of oral hypoglycemic drugs: Secondary | ICD-10-CM | POA: Diagnosis not present

## 2018-09-02 DIAGNOSIS — E119 Type 2 diabetes mellitus without complications: Secondary | ICD-10-CM | POA: Diagnosis not present

## 2018-09-02 DIAGNOSIS — J309 Allergic rhinitis, unspecified: Secondary | ICD-10-CM | POA: Diagnosis not present

## 2018-09-02 DIAGNOSIS — Z515 Encounter for palliative care: Secondary | ICD-10-CM | POA: Diagnosis not present

## 2018-09-02 DIAGNOSIS — C561 Malignant neoplasm of right ovary: Secondary | ICD-10-CM | POA: Diagnosis present

## 2018-09-02 NOTE — Progress Notes (Signed)
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 She presented with symptoms of bilateral lower extremity swelling as well as abdominal distention.GI. This was followed by a CT abdomen and chest imaging with contrast. CT showed cystic lesions of the enlarged bilateral ovaries the largest lesion on the left measuring 6.2 cm. Patient also noted to have numerous bulky retroperitoneal lymph nodes, largest left periaortic node or conglomerates near the superior mesenteric artery measuring at least 4.4 x 2.9 cm. Significant ascites was present. Patient underwent paracentesis and cytology from the fluid showed high-grade serous carcinoma. Tumor cells were positive for CK7, PAX 8, p53 and WT 1.  Patient seen by GYN onc and neoadjuvant chemotherapy recommended due to buly retroperitoneal adenopathy   Interval history-patient tolerated her cycle 1 of chemotherapy well with very minimal nausea and vomiting.  She still has ongoing fatigue and abdominal bloating.  She wishes to hold off on another paracentesis at this time.  Denies any pain  ECOG PS- 1 Pain scale- 0 Opioid associated constipation- no  Review of systems- Review of Systems  Constitutional: Positive for malaise/fatigue. Negative for chills, fever and weight loss.  HENT: Negative for congestion, ear discharge and nosebleeds.   Eyes:  Negative for blurred vision.  Respiratory: Negative for cough, hemoptysis, sputum production, shortness of breath and wheezing.   Cardiovascular: Negative for chest pain, palpitations, orthopnea and claudication.  Gastrointestinal: Negative for abdominal pain, blood in stool, constipation, diarrhea, heartburn, melena, nausea and vomiting.       Abdominal bloating  Genitourinary: Negative for dysuria, flank pain, frequency, hematuria and urgency.  Musculoskeletal: Negative for back pain, joint pain and myalgias.  Skin: Negative for rash.  Neurological: Negative for dizziness, tingling, focal weakness, seizures, weakness and headaches.  Endo/Heme/Allergies: Does not bruise/bleed easily.  Psychiatric/Behavioral: Negative for depression and suicidal ideas. The patient does not have insomnia.       Allergies  Allergen Reactions   Clarithromycin Nausea Only   Doxycycline     nausea, dizziness.   Penicillins Hives     Past Medical History:  Diagnosis Date   Allergy    Diabetes mellitus without complication ([place])    Diverticulosis    Hyperlipidemia    Hypothyroidism    Thyroid disease      Past Surgical History:  Procedure Laterality Date   BACK SURGERY  2007   Lumbar Surgery   BREAST CYST ASPIRATION Right    Around 10 years ago. Pt thinks it was the right breast but not sure.   CATARACT EXTRACTION Left 2010   Right Eye 2011   COLONOSCOPY WITH PROPOFOL N/A 09/02/2015   Procedure: COLONOSCOPY WITH PROPOFOL;  Surgeon: [name], MD;  Location: [place];  Service: Endoscopy;  Laterality: N/A;  Diabetic - oral meds   ESOPHAGOGASTRODUODENOSCOPY (EGD) WITH PROPOFOL N/A 11/03/2015   Procedure: ESOPHAGOGASTRODUODENOSCOPY (EGD) WITH PROPOFOL;  Surgeon: [name], MD;  Location: [place];  Service: Endoscopy;  Laterality: N/A;  EYE SURGERY Bilateral 06/2014    tear duck catherization ( Dr. Vallarie Mare)   POLYPECTOMY  09/02/2015   Procedure:  POLYPECTOMY;  Surgeon: [name], MD;  Location: [facility];  Service: Endoscopy;;   TONSILLECTOMY  1963    Social History   Socioeconomic History   Marital status: Single    Spouse name: Not on file   Number of children: Not on file   Years of education: Not on file   Highest education level: Not on file  Occupational History   Not on file  Social Needs   Financial resource strain: Not on file   Food insecurity    Worry: Not on file    Inability: Not on file   Transportation needs    Medical: Not on file    Non-medical: Not on file  Tobacco Use   Smoking status: Never Smoker   Smokeless tobacco: Never Used  Substance and Sexual Activity   Alcohol use: No   Drug use: No   Sexual activity: Not on file  Lifestyle   Physical activity    Days per week: Not on file    Minutes per session: Not on file   Stress: Not on file  Relationships   Social connections    Talks on phone: Not on file    Gets together: Not on file    Attends religious service: Not on file    Active member of club or organization: Not on file    Attends meetings of clubs or organizations: Not on file    Relationship status: Not on file   Intimate partner violence    Fear of current or ex partner: Not on file    Emotionally abused: Not on file    Physically abused: Not on file    Forced sexual activity: Not on file  Other Topics Concern   Not on file  Social History Narrative   Not on file    Family History  Problem Relation Age of Onset   Congestive Heart Failure Mother    Diabetes Mother    Stroke Mother    Hypothyroidism Father    Cancer Father        lung cancer   Cancer Paternal Grandmother        stomach     Current Outpatient Medications:    aspirin 81 MG tablet, ASPIRIN, '81MG'$  (Oral Tablet Delayed Release)  1 a week for 0 days  Quantity: 0.00;  Refills: 0   Ordered :02-Oct-2010  Doy Hutching ;  Started 12-Mar-2007 Active, Disp: , Rfl:     dexamethasone (DECADRON) 4 MG tablet, Take 2 tablets (8 mg total) by mouth daily. Start the day after carboplatin chemotherapy for 3 days., Disp: 30 tablet, Rfl: 1   fexofenadine (ALLEGRA) 60 MG tablet, Take 60 mg by mouth daily. Reported on 09/01/2015, Disp: , Rfl:    furosemide (LASIX) 20 MG tablet, Take 1 tablet (20 mg total) by mouth daily., Disp: 20 tablet, Rfl: 0   glipiZIDE (GLUCOTROL) 5 MG tablet, TAKE 1 TABLET BY MOUTH DAILY BEFORE BREAKFAST, Disp: 90 tablet, Rfl: 1   glucose blood (ONE TOUCH ULTRA TEST) test strip, USE TO TEST BLOOD SUGAR TWICE A DAY AS INSTRUCTED, Disp: 100 each, Rfl: 12   levothyroxine (SYNTHROID) 100 MCG tablet, Take 1 tablet (100 mcg total) by mouth daily., Disp: 90 tablet, Rfl: 1   LORazepam (ATIVAN) 0.5 MG tablet, Take 1 tablet (0.5 mg total) by mouth every 6 (six) hours as needed (  Nausea or vomiting)., Disp: 30 tablet, Rfl: 0   metFORMIN (GLUCOPHAGE) 500 MG tablet, Take 2 tablets (1,000 mg total) by mouth 2 (two) times daily., Disp: 360 tablet, Rfl: 1   MULTIPLE VITAMIN PO, MULTIVITAMINS (Oral Tablet)  1 Every Day for 0 days  Quantity: 0.00;  Refills: 0   Ordered :02-Oct-2010  Doy Hutching ;  Started 12-Mar-2007 Active, Disp: , Rfl:    ondansetron (ZOFRAN) 8 MG tablet, Take 1 tablet (8 mg total) by mouth 2 (two) times daily as needed for refractory nausea / vomiting. Start on day 3 after carboplatin chemo., Disp: 30 tablet, Rfl: 1   ONETOUCH DELICA LANCETS 94W MISC, USE TO TEST BLOOD SUGAR ONCE A DAY, Disp: 100 each, Rfl: 12   prochlorperazine (COMPAZINE) 10 MG tablet, Take 1 tablet (10 mg total) by mouth every 6 (six) hours as needed (Nausea or vomiting)., Disp: 30 tablet, Rfl: 1   simvastatin (ZOCOR) 20 MG tablet, Take 1 tablet (20 mg total) by mouth at bedtime., Disp: 90 tablet, Rfl: 1   spironolactone (ALDACTONE) 50 MG tablet, Take 1 tablet (50 mg total) by mouth daily., Disp: 20 tablet, Rfl: 0   calcipotriene (DOVONOX) 0.005 % ointment,  CALCIPOTRIENE, 0.005% (External Ointment) - Historical Medication  apply to skin daily (0.005 %) Active, Disp: , Rfl:    clobetasol ointment (TEMOVATE) 0.05 %, CLOBETASOL PROPIONATE, 0.05% (External Ointment) - Historical Medication  apply to skin daily (0.05 %) Active, Disp: , Rfl:    lidocaine-prilocaine (EMLA) cream, Apply to affected area once (Patient not taking: Reported on 09/01/2018), Disp: 30 g, Rfl: 3  Physical exam:  Vitals:   09/01/18 1055  BP: 113/79  Pulse: (!) 108  Resp: 18  Temp: 99.6 F (37.6 C)  TempSrc: Tympanic  Weight: 114 lb 8 oz (51.9 kg)   Physical Exam Constitutional:      Comments: Thin frail woman in no acute distress  HENT:     Head: Normocephalic and atraumatic.  Eyes:     Pupils: Pupils are equal, round, and reactive to light.  Neck:     Musculoskeletal: Normal range of motion.  Cardiovascular:     Rate and Rhythm: Normal rate and regular rhythm.     Heart sounds: Normal heart sounds.  Pulmonary:     Effort: Pulmonary effort is normal.     Breath sounds: Normal breath sounds.  Abdominal:     General: Bowel sounds are normal. There is distension.     Palpations: Abdomen is soft.     Comments: Ascites positive  Musculoskeletal:     Right lower leg: Edema present.     Left lower leg: Edema present.  Skin:    General: Skin is warm and dry.  Neurological:     Mental Status: She is alert and oriented to person, place, and time.      CMP Latest Ref Rng & Units 09/01/2018  Glucose 70 - 99 mg/dL 94  BUN 8 - 23 mg/dL 12  Creatinine 0.44 - 1.00 mg/dL 0.69  Sodium 135 - 145 mmol/L 139  Potassium 3.5 - 5.1 mmol/L 4.2  Chloride 98 - 111 mmol/L 101  CO2 22 - 32 mmol/L 27  Calcium 8.9 - 10.3 mg/dL 9.2  Total Protein 6.5 - 8.1 g/dL 7.3  Total Bilirubin 0.3 - 1.2 mg/dL 0.5  Alkaline Phos 38 - 126 U/L 127(H)  AST 15 - 41 U/L 41  ALT 0 - 44 U/L 20   CBC Latest Ref Rng & Units 09/01/2018  WBC 4.0 - 10.5 K/uL 21.9(H)  Hemoglobin 12.0 - 15.0 g/dL  10.5(L)  Hematocrit 36.0 - 46.0 % 32.9(L)  Platelets 150 - 400 K/uL 186    No images are attached to the encounter.  Ct Chest W Contrast  Result Date: 08/04/2018 CLINICAL DATA:  Diarrhea, abdominal distension, edema EXAM: CT CHEST, ABDOMEN, AND PELVIS WITH CONTRAST TECHNIQUE: Multidetector CT imaging of the chest, abdomen and pelvis was performed following the standard protocol during bolus administration of intravenous contrast. CONTRAST:  61m OMNIPAQUE IOHEXOL 300 MG/ML SOLN, additional oral enteric contrast COMPARISON:  None. FINDINGS: CT CHEST FINDINGS Cardiovascular: No significant vascular findings. Normal heart size. No pericardial effusion. Mediastinum/Nodes: No enlarged mediastinal, hilar, or axillary lymph nodes. Thyroid gland, trachea, and esophagus demonstrate no significant findings. Lungs/Pleura: Lungs are clear. No pleural effusion or pneumothorax. Musculoskeletal: No chest wall mass or suspicious bone lesions identified. Fusion of T7-T10. CT ABDOMEN PELVIS FINDINGS Hepatobiliary: No solid liver abnormality is seen. No gallstones, gallbladder wall thickening, or biliary dilatation. Pancreas: Unremarkable. No pancreatic ductal dilatation or surrounding inflammatory changes. Spleen: Normal in size without significant abnormality. Adrenals/Urinary Tract: Adrenal glands are unremarkable. Nonobstructive inferior pole left nephrolithiasis. Bladder is unremarkable. Stomach/Bowel: Stomach is within normal limits. Appendix is not clearly visualized and may be absent. No evidence of bowel wall thickening, distention, or inflammatory changes. Vascular/Lymphatic: No significant vascular findings are present. There are numerous bulky retroperitoneal lymph nodes, largest left periaortic nodes or conglomerates near the superior mesenteric artery measuring at least 4.4 x 2.9 cm (series 2, image 64). Reproductive: There are cystic lesions of the enlarged bilateral ovaries, largest lesion on the left  measuring at least 6.2 cm (series 2, image 116, series 6, image 76) Other: No abdominal wall hernia or abnormality. Moderate volume four quadrant ascites. Musculoskeletal: No acute or significant osseous findings. IMPRESSION: 1. There are cystic lesions of the enlarged bilateral ovaries, largest lesion on the left measuring at least 6.2 cm (series 2, image 116, series 6, image 76). Findings are highly concerning for ovarian neoplasm, particularly given presence of otherwise unexplained ascites and abdominal lymphadenopathy. 2. There are numerous bulky retroperitoneal lymph nodes, largest left periaortic nodes or conglomerates near the superior mesenteric artery measuring at least 4.4 x 2.9 cm (series 2, image 64), concerning for metastatic disease, or alternately primary malignancy such as lymphoma. 3.  Other chronic and incidental findings as detailed above. These results will be called to the ordering clinician or representative by the Radiologist Assistant, and communication documented in the PACS or zVision Dashboard. Electronically Signed   By: AEddie CandleM.D.   On: 08/04/2018 11:13   Ct Abdomen Pelvis W Contrast  Result Date: 08/04/2018 CLINICAL DATA:  Diarrhea, abdominal distension, edema EXAM: CT CHEST, ABDOMEN, AND PELVIS WITH CONTRAST TECHNIQUE: Multidetector CT imaging of the chest, abdomen and pelvis was performed following the standard protocol during bolus administration of intravenous contrast. CONTRAST:  775mOMNIPAQUE IOHEXOL 300 MG/ML SOLN, additional oral enteric contrast COMPARISON:  None. FINDINGS: CT CHEST FINDINGS Cardiovascular: No significant vascular findings. Normal heart size. No pericardial effusion. Mediastinum/Nodes: No enlarged mediastinal, hilar, or axillary lymph nodes. Thyroid gland, trachea, and esophagus demonstrate no significant findings. Lungs/Pleura: Lungs are clear. No pleural effusion or pneumothorax. Musculoskeletal: No chest wall mass or suspicious bone lesions  identified. Fusion of T7-T10. CT ABDOMEN PELVIS FINDINGS Hepatobiliary: No solid liver abnormality is seen. No gallstones, gallbladder wall thickening, or biliary dilatation. Pancreas: Unremarkable. No pancreatic ductal dilatation or surrounding inflammatory changes. Spleen: Normal in size  without significant abnormality. Adrenals/Urinary Tract: Adrenal glands are unremarkable. Nonobstructive inferior pole left nephrolithiasis. Bladder is unremarkable. Stomach/Bowel: Stomach is within normal limits. Appendix is not clearly visualized and may be absent. No evidence of bowel wall thickening, distention, or inflammatory changes. Vascular/Lymphatic: No significant vascular findings are present. There are numerous bulky retroperitoneal lymph nodes, largest left periaortic nodes or conglomerates near the superior mesenteric artery measuring at least 4.4 x 2.9 cm (series 2, image 64). Reproductive: There are cystic lesions of the enlarged bilateral ovaries, largest lesion on the left measuring at least 6.2 cm (series 2, image 116, series 6, image 76) Other: No abdominal wall hernia or abnormality. Moderate volume four quadrant ascites. Musculoskeletal: No acute or significant osseous findings. IMPRESSION: 1. There are cystic lesions of the enlarged bilateral ovaries, largest lesion on the left measuring at least 6.2 cm (series 2, image 116, series 6, image 76). Findings are highly concerning for ovarian neoplasm, particularly given presence of otherwise unexplained ascites and abdominal lymphadenopathy. 2. There are numerous bulky retroperitoneal lymph nodes, largest left periaortic nodes or conglomerates near the superior mesenteric artery measuring at least 4.4 x 2.9 cm (series 2, image 64), concerning for metastatic disease, or alternately primary malignancy such as lymphoma. 3.  Other chronic and incidental findings as detailed above. These results will be called to the ordering clinician or representative by the  Radiologist Assistant, and communication documented in the PACS or zVision Dashboard. Electronically Signed   By: Eddie Candle M.D.   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 EXAM: ULTRASOUND GUIDED PARACENTESIS MEDICATIONS: None. COMPLICATIONS: None immediate. PROCEDURE: Informed written consent was obtained from the patient after a discussion of the risks, benefits and alternatives to treatment. A timeout was performed prior to the initiation of the procedure. Initial ultrasound was performed to localize ascites. The right mid abdomen was prepped and draped in the usual sterile fashion. 1% was used for local anesthesia. Following this, a 6 Fr Safe-T-Centesis catheter was introduced. An ultrasound image was saved for documentation purposes. The paracentesis was performed. The catheter was removed and a dressing was applied. The patient tolerated the procedure well without immediate post procedural complication. FINDINGS: A total of approximately 3.9 L of bloody fluid was removed. IMPRESSION: Successful ultrasound-guided paracentesis yielding 3.9 liters of peritoneal fluid. Electronically Signed   By: Aletta Edouard M.D.   On: 08/25/2018 12:31     Assessment and plan- Patient is a 63 y.o. 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   He is here for toxicity check after cycle 1 of carbotaxol Avastin chemotherapy  He does have leukocytosis today likely secondary to the ongoing Neulasta but she received.  Overall she is tolerating chemotherapy well and she is keeping up with her oral and fluid intake.  She does not need IV fluids today.  I will see her back on 09/11/2018 with CBC with differential, CMP and Ca1 25 for cycle 2 of carbotaxol Avastin chemotherapy   Visit Diagnosis No diagnosis found.   Dr. Randa Evens, MD, MPH [facility] at [facility] [phone] 09/02/2018 9:50 AM

## 2018-09-09 ENCOUNTER — Other Ambulatory Visit: Payer: Self-pay

## 2018-09-09 ENCOUNTER — Other Ambulatory Visit: Payer: Self-pay | Admitting: Oncology

## 2018-09-09 DIAGNOSIS — C801 Malignant (primary) neoplasm, unspecified: Secondary | ICD-10-CM

## 2018-09-10 LAB — ACID FAST CULTURE WITH REFLEXED SENSITIVITIES (MYCOBACTERIA): Acid Fast Culture: NEGATIVE

## 2018-09-11 ENCOUNTER — Encounter: Payer: Self-pay | Admitting: Licensed Clinical Social Worker

## 2018-09-11 ENCOUNTER — Other Ambulatory Visit: Payer: Self-pay

## 2018-09-11 ENCOUNTER — Inpatient Hospital Stay: Payer: Commercial Managed Care - PPO

## 2018-09-11 ENCOUNTER — Inpatient Hospital Stay (HOSPITAL_BASED_OUTPATIENT_CLINIC_OR_DEPARTMENT_OTHER): Payer: Commercial Managed Care - PPO | Admitting: Licensed Clinical Social Worker

## 2018-09-11 ENCOUNTER — Inpatient Hospital Stay (HOSPITAL_BASED_OUTPATIENT_CLINIC_OR_DEPARTMENT_OTHER): Payer: Commercial Managed Care - PPO | Admitting: Oncology

## 2018-09-11 ENCOUNTER — Encounter: Payer: Self-pay | Admitting: Oncology

## 2018-09-11 VITALS — BP 115/79 | HR 89 | Temp 98.6°F | Resp 18 | Wt 115.6 lb

## 2018-09-11 VITALS — BP 124/80 | HR 88

## 2018-09-11 DIAGNOSIS — C801 Malignant (primary) neoplasm, unspecified: Secondary | ICD-10-CM

## 2018-09-11 DIAGNOSIS — D638 Anemia in other chronic diseases classified elsewhere: Secondary | ICD-10-CM

## 2018-09-11 DIAGNOSIS — C561 Malignant neoplasm of right ovary: Secondary | ICD-10-CM

## 2018-09-11 DIAGNOSIS — Z5111 Encounter for antineoplastic chemotherapy: Secondary | ICD-10-CM

## 2018-09-11 DIAGNOSIS — Z801 Family history of malignant neoplasm of trachea, bronchus and lung: Secondary | ICD-10-CM

## 2018-09-11 DIAGNOSIS — R180 Malignant ascites: Secondary | ICD-10-CM | POA: Diagnosis not present

## 2018-09-11 DIAGNOSIS — C562 Malignant neoplasm of left ovary: Secondary | ICD-10-CM

## 2018-09-11 LAB — CBC WITH DIFFERENTIAL/PLATELET
Abs Immature Granulocytes: 0.22 10*3/uL — ABNORMAL HIGH (ref 0.00–0.07)
Basophils Absolute: 0.1 10*3/uL (ref 0.0–0.1)
Basophils Relative: 1 %
Eosinophils Absolute: 0 10*3/uL (ref 0.0–0.5)
Eosinophils Relative: 0 %
HCT: 27.9 % — ABNORMAL LOW (ref 36.0–46.0)
Hemoglobin: 9 g/dL — ABNORMAL LOW (ref 12.0–15.0)
Immature Granulocytes: 2 %
Lymphocytes Relative: 8 %
Lymphs Abs: 1 10*3/uL (ref 0.7–4.0)
MCH: 33 pg (ref 26.0–34.0)
MCHC: 32.3 g/dL (ref 30.0–36.0)
MCV: 102.2 fL — ABNORMAL HIGH (ref 80.0–100.0)
Monocytes Absolute: 0.9 10*3/uL (ref 0.1–1.0)
Monocytes Relative: 8 %
Neutro Abs: 9.9 10*3/uL — ABNORMAL HIGH (ref 1.7–7.7)
Neutrophils Relative %: 81 %
Platelets: 247 10*3/uL (ref 150–400)
RBC: 2.73 MIL/uL — ABNORMAL LOW (ref 3.87–5.11)
RDW: 16.5 % — ABNORMAL HIGH (ref 11.5–15.5)
WBC: 12.1 10*3/uL — ABNORMAL HIGH (ref 4.0–10.5)
nRBC: 0.2 % (ref 0.0–0.2)

## 2018-09-11 LAB — COMPREHENSIVE METABOLIC PANEL
ALT: 24 U/L (ref 0–44)
AST: 34 U/L (ref 15–41)
Albumin: 3.3 g/dL — ABNORMAL LOW (ref 3.5–5.0)
Alkaline Phosphatase: 83 U/L (ref 38–126)
Anion gap: 10 (ref 5–15)
BUN: 16 mg/dL (ref 8–23)
CO2: 28 mmol/L (ref 22–32)
Calcium: 9.6 mg/dL (ref 8.9–10.3)
Chloride: 99 mmol/L (ref 98–111)
Creatinine, Ser: 0.54 mg/dL (ref 0.44–1.00)
GFR calc Af Amer: >60 mL/min (ref >60)
GFR calc non Af Amer: >60 mL/min (ref >60)
Glucose, Bld: 134 mg/dL — ABNORMAL HIGH (ref 70–99)
Potassium: 4 mmol/L (ref 3.5–5.1)
Sodium: 137 mmol/L (ref 135–145)
Total Bilirubin: 0.4 mg/dL (ref 0.3–1.2)
Total Protein: 7.7 g/dL (ref 6.5–8.1)

## 2018-09-11 LAB — PROTEIN, URINE, RANDOM: Total Protein, Urine: 48 mg/dL

## 2018-09-11 MED ORDER — SODIUM CHLORIDE 0.9 % IV SOLN
Freq: Once | INTRAVENOUS | Status: AC
  Administered 2018-09-11: 10:00:00 via INTRAVENOUS
  Filled 2018-09-11: qty 250

## 2018-09-11 MED ORDER — SODIUM CHLORIDE 0.9 % IV SOLN
429.5000 mg | Freq: Once | INTRAVENOUS | Status: AC
  Administered 2018-09-11: 430 mg via INTRAVENOUS
  Filled 2018-09-11: qty 43

## 2018-09-11 MED ORDER — PEGFILGRASTIM 6 MG/0.6ML ~~LOC~~ PSKT
6.0000 mg | PREFILLED_SYRINGE | Freq: Once | SUBCUTANEOUS | Status: AC
  Administered 2018-09-11: 6 mg via SUBCUTANEOUS
  Filled 2018-09-11: qty 0.6

## 2018-09-11 MED ORDER — SODIUM CHLORIDE 0.9 % IV SOLN
175.0000 mg/m2 | Freq: Once | INTRAVENOUS | Status: AC
  Administered 2018-09-11: 270 mg via INTRAVENOUS
  Filled 2018-09-11: qty 45

## 2018-09-11 MED ORDER — SODIUM CHLORIDE 0.9 % IV SOLN
Freq: Once | INTRAVENOUS | Status: AC
  Administered 2018-09-11: 11:00:00 via INTRAVENOUS
  Filled 2018-09-11: qty 5

## 2018-09-11 MED ORDER — DIPHENHYDRAMINE HCL 50 MG/ML IJ SOLN
50.0000 mg | Freq: Once | INTRAMUSCULAR | Status: AC
  Administered 2018-09-11: 50 mg via INTRAVENOUS
  Filled 2018-09-11: qty 1

## 2018-09-11 MED ORDER — SODIUM CHLORIDE 0.9 % IV SOLN
15.0000 mg/kg | Freq: Once | INTRAVENOUS | Status: AC
  Administered 2018-09-11: 11:00:00 800 mg via INTRAVENOUS
  Filled 2018-09-11: qty 32

## 2018-09-11 MED ORDER — PALONOSETRON HCL INJECTION 0.25 MG/5ML
0.2500 mg | Freq: Once | INTRAVENOUS | Status: AC
  Administered 2018-09-11: 10:00:00 0.25 mg via INTRAVENOUS
  Filled 2018-09-11: qty 5

## 2018-09-11 MED ORDER — FAMOTIDINE IN NACL 20-0.9 MG/50ML-% IV SOLN
20.0000 mg | Freq: Once | INTRAVENOUS | Status: AC
  Administered 2018-09-11: 10:00:00 20 mg via INTRAVENOUS
  Filled 2018-09-11: qty 50

## 2018-09-11 NOTE — Progress Notes (Signed)
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 Family history of lung cancer      HISTORY OF PRESENT ILLNESS:   Meredith Jones, a 63 y.o. female, was seen for a [place] [facility] [place] cancer genetics consultation at the request of Dr. Janese Jones due to a personal history of ovarian cancer.  Meredith Jones presents to clinic today to discuss the possibility of a hereditary predisposition to cancer, genetic testing, and to further clarify her future cancer risks, as well as potential cancer risks for family members.   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 Her current treatment plan includes chemotherapy followed by surgery.  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[place])   08/14/2018 Cancer Staging   Staging form: Exocrine Pancreas, AJCC 8th Edition - Clinical stage from 08/14/2018: Stage III (cT3, cN2, cM0) - Signed by Meredith Guadeloupe, MD on 08/17/2018   08/21/2018 -  Chemotherapy   The patient had palonosetron (ALOXI) injection 0.25 mg, 0.25 mg, Intravenous,  Once, 2 of 6 cycles Administration: 0.25 mg (08/21/2018) pegfilgrastim (NEULASTA ONPRO KIT) injection 6 mg, 6 mg, Subcutaneous, Once, 2 of 6 cycles Administration: 6 mg (08/21/2018) bevacizumab (AVASTIN) 800 mg in sodium chloride 0.9 % 100 mL chemo infusion, 15 mg/kg = 800 mg (100 % of original dose 15 mg/kg), Intravenous,  Once, 1 of 1 cycle Dose modification: 15 mg/kg (original dose 15 mg/kg, Cycle 1) Administration: 800 mg (08/21/2018) CARBOplatin (PARAPLATIN) 430 mg in sodium chloride 0.9 % 250 mL chemo infusion, 430 mg (100 % of original dose 429.5 mg), Intravenous,  Once, 2 of 6 cycles Dose modification:   (original dose 429.5 mg, Cycle 1) Administration: 430 mg (08/21/2018) PACLitaxel  (TAXOL) 270 mg in sodium chloride 0.9 % 250 mL chemo infusion (> '80mg'$ /m2), 175 mg/m2 = 270 mg, Intravenous,  Once, 2 of 6 cycles Administration: 270 mg (08/21/2018) fosaprepitant (EMEND) 150 mg, dexamethasone (DECADRON) 12 mg in sodium chloride 0.9 % 145 mL IVPB, , Intravenous,  Once, 2 of 6 cycles Administration:  (08/21/2018) bevacizumab-awwb (MVASI) 800 mg in sodium chloride 0.9 % 100 mL chemo infusion, 15 mg/kg = 800 mg (100 % of original dose 15 mg/kg), Intravenous,  Once, 1 of 5 cycles Dose modification: 15 mg/kg (original dose 15 mg/kg, Cycle 2)  for chemotherapy treatment.        Past Medical History:  Diagnosis Date  . Allergy   . Diabetes mellitus without complication ([place])   . Diverticulosis   . Family history of lung cancer   . Hyperlipidemia   . Hypothyroidism   . Thyroid disease     Past Surgical History:  Procedure Laterality Date  . BACK SURGERY  2007   Lumbar Surgery  . BREAST CYST ASPIRATION Right    Around 10 years ago. Pt thinks it was the right breast but not sure.  Marland Kitchen CATARACT EXTRACTION Left 2010   Right Eye 2011  . COLONOSCOPY WITH PROPOFOL N/A 09/02/2015   Procedure: COLONOSCOPY WITH PROPOFOL;  Surgeon: Meredith Lame, MD;  Location: [facility];  Service: Endoscopy;  Laterality: N/A;  Diabetic - oral meds  . ESOPHAGOGASTRODUODENOSCOPY (EGD) WITH PROPOFOL N/A 11/03/2015   Procedure: ESOPHAGOGASTRODUODENOSCOPY (EGD) WITH PROPOFOL;  Surgeon: Meredith Lame, MD;  Location: [facility];  Service: Endoscopy;  Laterality: N/A;  . EYE SURGERY Bilateral 06/2014    tear duck catherization ( Meredith Jones)  . POLYPECTOMY  09/02/2015   Procedure: POLYPECTOMY;  Surgeon: Meredith Lame, MD;  Location: [facility];  Service: Endoscopy;;  . TONSILLECTOMY  1963    Social History   Socioeconomic History  . Marital status: Single    Spouse name: Not on file  . Number of children: Not on file  . Years of education: Not on file  . Highest education level:  Not on file  Occupational History  . Not on file  Social Needs  . Financial resource strain: Not on file  . Food insecurity    Worry: Not on file    Inability: Not on file  . Transportation needs    Medical: Not on file    Non-medical: Not on file  Tobacco Use  . Smoking status: Never Smoker  . Smokeless tobacco: Never Used  Substance and Sexual Activity  . Alcohol use: No  . Drug use: No  . Sexual activity: Not on file  Lifestyle  . Physical activity    Days per week: Not on file    Minutes per session: Not on file  . Stress: Not on file  Relationships  . Social Herbalist on phone: Not on file    Gets together: Not on file    Attends religious service: Not on file    Active member of club or organization: Not on file    Attends meetings of clubs or organizations: Not on file    Relationship status: Not on file  Other Topics Concern  . Not on file  Social History Narrative  . Not on file     FAMILY HISTORY:  We obtained a detailed, 4-generation family history.  Significant diagnoses are listed below: Family History  Problem Relation Age of Onset  . Congestive Heart Failure Mother   . Diabetes Mother   . Stroke Mother   . Hypothyroidism Father   . Cancer Father        lung cancer  . Cancer Paternal Grandmother        stomach    Meredith Jones does not have children. She had one brother who died shortly after he was born.   Meredith Jones's mother died at 78, no history of cancer. Pat[place] had 1 maternal uncle and 1 maternal aunt, no cancers. No known cancers in maternal cousins. Her maternal grandparents both died in their 63s, no cancers.  Meredith Jones's father died at 109 and had lung cancer. Patient had several half aunts/half uncles, she believes they may have had cancer but she isn't sure of the types. No known cancers in cousins. She does not have information about her grandfather, her paternal grandmother died at 42 with no history of cancer.   Meredith Jones  is unaware of previous family history of genetic testing for hereditary cancer risks. Patient's ancestors are of [place] descent. There is no reported Ashkenazi Jewish ancestry. There is no known consanguinity.  GENETIC COUNSELING ASSESSMENT: Ms. Arras is a 63 y.o. female with a personal history which is somewhat suggestive of a hereditary cancer syndrome and predisposition to cancer. We, therefore, discussed and recommended the following at today's visit.   DISCUSSION: We discussed that 15 - 25% of ovarian cancer is hereditary, with most cases associated with BRCA1/BRCA2 mutations.  There are other genes that can be associated with hereditary ovarian cancer syndromes.  We discussed that testing is beneficial for several reasons including knowing if an indiivdual is a candidate for PARP inhibitors,  knowing how to follow individuals after completing their treatment, and understand if other family members could be at risk for cancer and allow them to undergo genetic testing.   We reviewed the characteristics, features and inheritance patterns of hereditary cancer syndromes. We also discussed genetic testing, including the appropriate family members to test, the process of testing, insurance coverage and turn-around-time for results. We discussed the implications of a negative, positive and/or variant of uncertain significant result. We recommended Ms. Hartung pursue genetic testing for the [facility] TumorNext-HRD + CancerNext gene panel.   We discussed that genetic testing through [facility] will test for hereditary mutations that could explain her diagnosis of cancer.  However, homologous recombination testing (HRD) is genetic testing performed on her tumor that can determine genetic changes that could influence her management.  HRD testing is performed in tandem with genetic testing, and typically at no additional cost.    The CancerNext gene panel offered by [facility] includes sequencing and  rearrangement analysis for the following 34 genes:   APC, ATM, BARD1, BMPR1A, BRCA1, BRCA2, BRIP1, CDH1, CDK4, CDKN2A, CHEK2, DICER1, EPCAM, GREM1, HOXB13, MLH1, MRE11A, MSH2, MSH6, MUTYH, NBN, NF1, PALB2, PMS2, POLD1, POLE, PTEN, RAD50, RAD51C, RAD51D, SMAD4, SMARCA4, STK11, and TP53.   Somatic genes analyzed through TumorNext-HRD: ATM, BARD1, BRCA1, BRCA2, BRIP1, CHEK2, MRE11A, NBN, PALB2, RAD51C, RAD51D.  Based on Ms. Keys's personal and family history of cancer, she meets medical criteria for genetic testing. Despite that she meets criteria, she may still have an out of pocket cost. We discussed that if her out of pocket cost for testing is over $100, the laboratory will call and confirm whether she wants to proceed with testing.  If the out of pocket cost of testing is less than $100 she will be billed by the genetic testing laboratory.   PLAN: After considering the risks, benefits, and limitations, Ms. Longstreth provided informed consent to pursue genetic testing. Since she has not had surgery yet, and the tumor testing must be performed at the same time as germline, we recommended waiting to do testing for Ms. Lague until she has had her surgery. We will reach back out to Ms. Mcbeth once she has had surgery to coordinate testing.  Lastly, we encouraged Ms. Morr to remain in contact with cancer genetics annually so that we can continuously update the family history and inform her of any changes in cancer genetics and testing that may be of benefit for this family.   Ms. Rising questions were answered to her satisfaction today. Our contact information was provided should additional questions or concerns arise. Thank you for the referral and allowing [place] to share in the care of your patient.   Faith Rogue, MS, [facility] Genetic Counselor [email or web address] Phone: [phone]  The patient was seen for a total of 25 minutes in face-to-face genetic counseling.  Dr. Grayland Ormond was   available for discussion regarding this case.   _______________________________________________________________________ For Office Staff:  Number of people involved in session: 1 Was an Intern/ student involved with case: no

## 2018-09-11 NOTE — Progress Notes (Signed)
Patient is here for follow up, she is doing well. Would like to talking to someone about genetic testing.

## 2018-09-12 LAB — CA 125: Cancer Antigen (CA) 125: 200 U/mL — ABNORMAL HIGH (ref 0.0–38.1)

## 2018-09-12 NOTE — Progress Notes (Signed)
Hematology/Oncology Consult note [facility]  [phone] [phone]  Patient Care Team: Rubye Beach as PCP - General (Family Medicine) Clent Jacks, RN as Oncology Nurse Navigator   Name of the [name]  [id number]  [birth year]   Date of visit: 09/12/18  Diagnosis- Atleast Stage III serous adenocarcinoma of the ovary [id number]  Chief complaint/ Reason for visit-on treatment assessment prior to cycle 2 of carbotaxol Avastin chemotherapy  Heme/Onc history: Patient is a 63 year old female with a past medical history significant for hyperlipidemia hypothyroidism and diabetes. She presented with symptoms of bilateral lower extremity swelling as well as abdominal distention.GI. This was followed by a CT abdomen and chest imaging with contrast. CT showed cystic lesions of the enlarged bilateral ovaries the largest lesion on the left measuring 6.2 cm. Patient also noted to have numerous bulky retroperitoneal lymph nodes, largest left periaortic node or conglomerates near the superior mesenteric artery measuring at least 4.4 x 2.9 cm. Significant ascites was present. Patient underwent paracentesis and cytology from the fluid showed high-grade serous carcinoma. Tumor cells were positive for CK7, PAX 8, p53 and WT 1.  Patient seen by GYN onc and neoadjuvant chemotherapy recommended due to buly retroperitoneal adenopathy  Cycle 1 of carbotaxol Avastin neoadjuvant chemotherapy started on 08/21/2018  Interval history-she feels like her ascites is a little better and she feels less full in her abdomen.  Continues to have bilateral lower extremity swelling.  Denies any significant nausea vomiting secondary to chemotherapy.  ECOG PS- 1 Pain scale- 0   Review of systems- Review of Systems  Constitutional: Positive for malaise/fatigue. Negative for chills, fever and weight loss.  HENT: Negative for congestion, ear  discharge and nosebleeds.   Eyes: Negative for blurred vision.  Respiratory: Negative for cough, hemoptysis, sputum production, shortness of breath and wheezing.   Cardiovascular: Positive for leg swelling. Negative for chest pain, palpitations, orthopnea and claudication.  Gastrointestinal: Negative for abdominal pain, blood in stool, constipation, diarrhea, heartburn, melena, nausea and vomiting.       Abdominal distention  Genitourinary: Negative for dysuria, flank pain, frequency, hematuria and urgency.  Musculoskeletal: Negative for back pain, joint pain and myalgias.  Skin: Negative for rash.  Neurological: Negative for dizziness, tingling, focal weakness, seizures, weakness and headaches.  Endo/Heme/Allergies: Does not bruise/bleed easily.  Psychiatric/Behavioral: Negative for depression and suicidal ideas. The patient does not have insomnia.       Allergies  Allergen Reactions  . Clarithromycin Nausea Only  . Doxycycline     nausea, dizziness.  Marland Kitchen Penicillins Hives     Past Medical History:  Diagnosis Date  . Allergy   . Diabetes mellitus without complication ([place])   . Diverticulosis   . Family history of lung cancer   . Hyperlipidemia   . Hypothyroidism   . Thyroid disease      Past Surgical History:  Procedure Laterality Date  . BACK SURGERY  2007   Lumbar Surgery  . BREAST CYST ASPIRATION Right    Around 10 years ago. Pt thinks it was the right breast but not sure.  Marland Kitchen CATARACT EXTRACTION Left 2010   Right Eye 2011  . COLONOSCOPY WITH PROPOFOL N/A 09/02/2015   Procedure: COLONOSCOPY WITH PROPOFOL;  Surgeon: [name], MD;  Location: [facility];  Service: Endoscopy;  Laterality: N/A;  Diabetic - oral meds  . ESOPHAGOGASTRODUODENOSCOPY (EGD) WITH PROPOFOL N/A 11/03/2015   Procedure: ESOPHAGOGASTRODUODENOSCOPY (EGD) WITH PROPOFOL;  Surgeon: [name],  MD;  Location: [facility];  Service: Endoscopy;  Laterality: N/A;  . EYE SURGERY Bilateral  06/2014    tear duck catherization ( Dr. Vallarie Mare)  . POLYPECTOMY  09/02/2015   Procedure: POLYPECTOMY;  Surgeon: [name], MD;  Location: [facility];  Service: Endoscopy;;  . TONSILLECTOMY  1963    Social History   Socioeconomic History  . Marital status: Single    Spouse name: Not on file  . Number of children: Not on file  . Years of education: Not on file  . Highest education level: Not on file  Occupational History  . Not on file  Social Needs  . Financial resource strain: Not on file  . Food insecurity    Worry: Not on file    Inability: Not on file  . Transportation needs    Medical: Not on file    Non-medical: Not on file  Tobacco Use  . Smoking status: Never Smoker  . Smokeless tobacco: Never Used  Substance and Sexual Activity  . Alcohol use: No  . Drug use: No  . Sexual activity: Not on file  Lifestyle  . Physical activity    Days per week: Not on file    Minutes per session: Not on file  . Stress: Not on file  Relationships  . Social Herbalist on phone: Not on file    Gets together: Not on file    Attends religious service: Not on file    Active member of club or organization: Not on file    Attends meetings of clubs or organizations: Not on file    Relationship status: Not on file  . Intimate partner violence    Fear of current or ex partner: Not on file    Emotionally abused: Not on file    Physically abused: Not on file    Forced sexual activity: Not on file  Other Topics Concern  . Not on file  Social History Narrative  . Not on file    Family History  Problem Relation Age of Onset  . Congestive Heart Failure Mother   . Diabetes Mother   . Stroke Mother   . Hypothyroidism Father   . Cancer Father        lung cancer  . Cancer Paternal Grandmother        stomach     Current Outpatient Medications:  .  aspirin 81 MG tablet, ASPIRIN, 81MG (Oral Tablet Delayed Release)  1 a week for 0 days  Quantity: 0.00;   Refills: 0   Ordered :02-Oct-2010  Doy Hutching ;  Started 12-Mar-2007 Active, Disp: , Rfl:  .  calcipotriene (DOVONOX) 0.005 % ointment, CALCIPOTRIENE, 0.005% (External Ointment) - Historical Medication  apply to skin daily (0.005 %) Active, Disp: , Rfl:  .  clobetasol ointment (TEMOVATE) 0.05 %, CLOBETASOL PROPIONATE, 0.05% (External Ointment) - Historical Medication  apply to skin daily (0.05 %) Active, Disp: , Rfl:  .  dexamethasone (DECADRON) 4 MG tablet, Take 2 tablets (8 mg total) by mouth daily. Start the day after carboplatin chemotherapy for 3 days., Disp: 30 tablet, Rfl: 1 .  fexofenadine (ALLEGRA) 60 MG tablet, Take 60 mg by mouth daily. Reported on 09/01/2015, Disp: , Rfl:  .  furosemide (LASIX) 20 MG tablet, Take 1 tablet (20 mg total) by mouth daily., Disp: 20 tablet, Rfl: 0 .  glipiZIDE (GLUCOTROL) 5 MG tablet, TAKE 1 TABLET BY MOUTH DAILY BEFORE BREAKFAST, Disp: 90 tablet, Rfl: 1 .  glucose blood (ONE TOUCH ULTRA TEST) test strip, USE TO TEST BLOOD SUGAR TWICE A DAY AS INSTRUCTED, Disp: 100 each, Rfl: 12 .  levothyroxine (SYNTHROID) 100 MCG tablet, Take 1 tablet (100 mcg total) by mouth daily., Disp: 90 tablet, Rfl: 1 .  lidocaine-prilocaine (EMLA) cream, Apply to affected area once, Disp: 30 g, Rfl: 3 .  LORazepam (ATIVAN) 0.5 MG tablet, Take 1 tablet (0.5 mg total) by mouth every 6 (six) hours as needed (Nausea or vomiting)., Disp: 30 tablet, Rfl: 0 .  metFORMIN (GLUCOPHAGE) 500 MG tablet, Take 2 tablets (1,000 mg total) by mouth 2 (two) times daily., Disp: 360 tablet, Rfl: 1 .  MULTIPLE VITAMIN PO, MULTIVITAMINS (Oral Tablet)  1 Every Day for 0 days  Quantity: 0.00;  Refills: 0   Ordered :02-Oct-2010  Doy Hutching ;  Started 12-Mar-2007 Active, Disp: , Rfl:  .  ondansetron (ZOFRAN) 8 MG tablet, Take 1 tablet (8 mg total) by mouth 2 (two) times daily as needed for refractory nausea / vomiting. Start on day 3 after carboplatin chemo., Disp: 30 tablet, Rfl: 1 .  ONETOUCH DELICA  LANCETS 78L MISC, USE TO TEST BLOOD SUGAR ONCE A DAY, Disp: 100 each, Rfl: 12 .  prochlorperazine (COMPAZINE) 10 MG tablet, Take 1 tablet (10 mg total) by mouth every 6 (six) hours as needed (Nausea or vomiting)., Disp: 30 tablet, Rfl: 1 .  simvastatin (ZOCOR) 20 MG tablet, Take 1 tablet (20 mg total) by mouth at bedtime., Disp: 90 tablet, Rfl: 1 .  spironolactone (ALDACTONE) 50 MG tablet, Take 1 tablet (50 mg total) by mouth daily., Disp: 20 tablet, Rfl: 0  Physical exam:  Vitals:   09/11/18 0901  BP: 115/79  Pulse: 89  Resp: 18  Temp: 98.6 F (37 C)  TempSrc: Tympanic  Weight: 115 lb 9.6 oz (52.4 kg)   Physical Exam Constitutional:      Comments: Thin cachectic female in no acute distress  HENT:     Head: Normocephalic and atraumatic.  Eyes:     Pupils: Pupils are equal, round, and reactive to light.  Neck:     Musculoskeletal: Normal range of motion.  Cardiovascular:     Rate and Rhythm: Normal rate and regular rhythm.     Heart sounds: Normal heart sounds.  Pulmonary:     Effort: Pulmonary effort is normal.     Breath sounds: Normal breath sounds.  Abdominal:     General: There is distension.     Comments: Ascites positive  Musculoskeletal:     Right lower leg: Edema present.     Left lower leg: Edema present.  Skin:    General: Skin is warm and dry.  Neurological:     Mental Status: She is alert and oriented to person, place, and time.      CMP Latest Ref Rng & Units 09/11/2018  Glucose 70 - 99 mg/dL 134(H)  BUN 8 - 23 mg/dL 16  Creatinine 0.44 - 1.00 mg/dL 0.54  Sodium 135 - 145 mmol/L 137  Potassium 3.5 - 5.1 mmol/L 4.0  Chloride 98 - 111 mmol/L 99  CO2 22 - 32 mmol/L 28  Calcium 8.9 - 10.3 mg/dL 9.6  Total Protein 6.5 - 8.1 g/dL 7.7  Total Bilirubin 0.3 - 1.2 mg/dL 0.4  Alkaline Phos 38 - 126 U/L 83  AST 15 - 41 U/L 34  ALT 0 - 44 U/L 24   CBC Latest Ref Rng & Units 09/11/2018  WBC 4.0 - 10.5 K/uL 12.1(H)  Hemoglobin 12.0 - 15.0 g/dL 9.0(L)   Hematocrit 36.0 - 46.0 % 27.9(L)  Platelets 150 - 400 K/uL 247    No images are attached to the encounter.  US Paracentesis  Result Date: 08/25/2018 INDICATION: Malignant ascites secondary to ovarian carcinoma. EXAM: ULTRASOUND GUIDED PARACENTESIS MEDICATIONS: None. COMPLICATIONS: None immediate. PROCEDURE: Informed written consent was obtained from the patient after a discussion of the risks, benefits and alternatives to treatment. A timeout was performed prior to the initiation of the procedure. Initial ultrasound was performed to localize ascites. The right mid abdomen was prepped and draped in the usual sterile fashion. 1% was used for local anesthesia. Following this, a 6 Fr Safe-T-Centesis catheter was introduced. An ultrasound image was saved for documentation purposes. The paracentesis was performed. The catheter was removed and a dressing was applied. The patient tolerated the procedure well without immediate post procedural complication. FINDINGS: A total of approximately 3.9 L of bloody fluid was removed. IMPRESSION: Successful ultrasound-guided paracentesis yielding 3.9 liters of peritoneal fluid. Electronically Signed   By: Aletta Edouard M.D.   On: 08/25/2018 12:31     Assessment and plan- Patient is a 63 y.o. female with serous adenocarcinoma bilateral ovaries at least stage III cT3 N2 M0.  She is here for on treatment assessment prior to cycle 2 of carbotaxol Avastin chemotherapy  Counts okay to proceed with cycle 2 of carbotaxol Avastin chemotherapy today with onpro Neulasta support.  She has tolerated cycle 1 of chemotherapy well without any significant side effects.  Clinically she is somewhat improved as far as her ascites is concerned.  Hold off on any therapeutic paracentesis at this time.  I will see her back in 3 weeks time with CBC with differential, CMP, urine protein for cycle 3 of carbotaxol Avastin chemotherapy.  Ca1 25 has trended down from 491 to-200.  Plan to repeat  scans after 3 cycles of chemotherapy and assess for resectability at that time  Anemia: Likely secondary to malignancy.  Continue to monitor.   Visit Diagnosis 1. 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 Encounter for antineoplastic chemotherapy   3. Anemia of chronic disease      Dr. Randa Evens, MD, MPH [facility] at [facility] [phone] 09/12/2018 8:30 AM

## 2018-10-02 ENCOUNTER — Inpatient Hospital Stay (HOSPITAL_BASED_OUTPATIENT_CLINIC_OR_DEPARTMENT_OTHER): Payer: Commercial Managed Care - PPO | Admitting: Oncology

## 2018-10-02 ENCOUNTER — Inpatient Hospital Stay: Payer: Commercial Managed Care - PPO

## 2018-10-02 ENCOUNTER — Inpatient Hospital Stay
Payer: Commercial Managed Care - PPO | Attending: Hospice and Palliative Medicine | Admitting: Hospice and Palliative Medicine

## 2018-10-02 ENCOUNTER — Encounter: Payer: Self-pay | Admitting: Oncology

## 2018-10-02 ENCOUNTER — Ambulatory Visit: Payer: Commercial Managed Care - PPO

## 2018-10-02 ENCOUNTER — Other Ambulatory Visit: Payer: Self-pay

## 2018-10-02 VITALS — BP 112/76 | HR 93 | Temp 98.1°F | Resp 16 | Ht 63.0 in | Wt 111.4 lb

## 2018-10-02 DIAGNOSIS — D649 Anemia, unspecified: Secondary | ICD-10-CM

## 2018-10-02 DIAGNOSIS — Z7984 Long term (current) use of oral hypoglycemic drugs: Secondary | ICD-10-CM | POA: Insufficient documentation

## 2018-10-02 DIAGNOSIS — C562 Malignant neoplasm of left ovary: Secondary | ICD-10-CM | POA: Diagnosis not present

## 2018-10-02 DIAGNOSIS — E119 Type 2 diabetes mellitus without complications: Secondary | ICD-10-CM | POA: Diagnosis not present

## 2018-10-02 DIAGNOSIS — Z7982 Long term (current) use of aspirin: Secondary | ICD-10-CM | POA: Diagnosis not present

## 2018-10-02 DIAGNOSIS — Z79899 Other long term (current) drug therapy: Secondary | ICD-10-CM | POA: Diagnosis not present

## 2018-10-02 DIAGNOSIS — Z5189 Encounter for other specified aftercare: Secondary | ICD-10-CM | POA: Diagnosis not present

## 2018-10-02 DIAGNOSIS — C801 Malignant (primary) neoplasm, unspecified: Secondary | ICD-10-CM

## 2018-10-02 DIAGNOSIS — Z5111 Encounter for antineoplastic chemotherapy: Secondary | ICD-10-CM

## 2018-10-02 DIAGNOSIS — Z7189 Other specified counseling: Secondary | ICD-10-CM | POA: Diagnosis not present

## 2018-10-02 DIAGNOSIS — E039 Hypothyroidism, unspecified: Secondary | ICD-10-CM | POA: Insufficient documentation

## 2018-10-02 DIAGNOSIS — R188 Other ascites: Secondary | ICD-10-CM | POA: Insufficient documentation

## 2018-10-02 DIAGNOSIS — Z5112 Encounter for antineoplastic immunotherapy: Secondary | ICD-10-CM | POA: Insufficient documentation

## 2018-10-02 DIAGNOSIS — C561 Malignant neoplasm of right ovary: Secondary | ICD-10-CM | POA: Insufficient documentation

## 2018-10-02 DIAGNOSIS — E785 Hyperlipidemia, unspecified: Secondary | ICD-10-CM | POA: Insufficient documentation

## 2018-10-02 LAB — CBC WITH DIFFERENTIAL/PLATELET
Abs Immature Granulocytes: 0.12 10*3/uL — ABNORMAL HIGH (ref 0.00–0.07)
Basophils Absolute: 0.1 10*3/uL (ref 0.0–0.1)
Basophils Relative: 1 %
Eosinophils Absolute: 0.1 10*3/uL (ref 0.0–0.5)
Eosinophils Relative: 1 %
HCT: 27.3 % — ABNORMAL LOW (ref 36.0–46.0)
Hemoglobin: 9 g/dL — ABNORMAL LOW (ref 12.0–15.0)
Immature Granulocytes: 1 %
Lymphocytes Relative: 11 %
Lymphs Abs: 1.1 10*3/uL (ref 0.7–4.0)
MCH: 34.6 pg — ABNORMAL HIGH (ref 26.0–34.0)
MCHC: 33 g/dL (ref 30.0–36.0)
MCV: 105 fL — ABNORMAL HIGH (ref 80.0–100.0)
Monocytes Absolute: 0.8 10*3/uL (ref 0.1–1.0)
Monocytes Relative: 7 %
Neutro Abs: 8.5 10*3/uL — ABNORMAL HIGH (ref 1.7–7.7)
Neutrophils Relative %: 79 %
Platelets: 207 10*3/uL (ref 150–400)
RBC: 2.6 MIL/uL — ABNORMAL LOW (ref 3.87–5.11)
RDW: 19.1 % — ABNORMAL HIGH (ref 11.5–15.5)
WBC: 10.6 10*3/uL — ABNORMAL HIGH (ref 4.0–10.5)
nRBC: 0 % (ref 0.0–0.2)

## 2018-10-02 LAB — COMPREHENSIVE METABOLIC PANEL
ALT: 23 U/L (ref 0–44)
AST: 30 U/L (ref 15–41)
Albumin: 3.7 g/dL (ref 3.5–5.0)
Alkaline Phosphatase: 88 U/L (ref 38–126)
Anion gap: 10 (ref 5–15)
BUN: 19 mg/dL (ref 8–23)
CO2: 27 mmol/L (ref 22–32)
Calcium: 9.5 mg/dL (ref 8.9–10.3)
Chloride: 101 mmol/L (ref 98–111)
Creatinine, Ser: 0.61 mg/dL (ref 0.44–1.00)
GFR calc Af Amer: >60 mL/min (ref >60)
GFR calc non Af Amer: >60 mL/min (ref >60)
Glucose, Bld: 160 mg/dL — ABNORMAL HIGH (ref 70–99)
Potassium: 4.3 mmol/L (ref 3.5–5.1)
Sodium: 138 mmol/L (ref 135–145)
Total Bilirubin: 0.5 mg/dL (ref 0.3–1.2)
Total Protein: 8.3 g/dL — ABNORMAL HIGH (ref 6.5–8.1)

## 2018-10-02 LAB — PROTEIN, URINE, RANDOM: Total Protein, Urine: 26 mg/dL

## 2018-10-02 MED ORDER — SODIUM CHLORIDE 0.9 % IV SOLN
Freq: Once | INTRAVENOUS | Status: AC
  Administered 2018-10-02: 11:00:00 via INTRAVENOUS
  Filled 2018-10-02: qty 250

## 2018-10-02 MED ORDER — SODIUM CHLORIDE 0.9 % IV SOLN
Freq: Once | INTRAVENOUS | Status: AC
  Administered 2018-10-02: 11:00:00 via INTRAVENOUS
  Filled 2018-10-02: qty 5

## 2018-10-02 MED ORDER — SODIUM CHLORIDE 0.9 % IV SOLN
429.5000 mg | Freq: Once | INTRAVENOUS | Status: AC
  Administered 2018-10-02: 430 mg via INTRAVENOUS
  Filled 2018-10-02: qty 43

## 2018-10-02 MED ORDER — PALONOSETRON HCL INJECTION 0.25 MG/5ML
0.2500 mg | Freq: Once | INTRAVENOUS | Status: AC
  Administered 2018-10-02: 0.25 mg via INTRAVENOUS
  Filled 2018-10-02: qty 5

## 2018-10-02 MED ORDER — PEGFILGRASTIM 6 MG/0.6ML ~~LOC~~ PSKT
6.0000 mg | PREFILLED_SYRINGE | Freq: Once | SUBCUTANEOUS | Status: AC
  Administered 2018-10-02: 6 mg via SUBCUTANEOUS
  Filled 2018-10-02: qty 0.6

## 2018-10-02 MED ORDER — FAMOTIDINE IN NACL 20-0.9 MG/50ML-% IV SOLN
20.0000 mg | Freq: Once | INTRAVENOUS | Status: AC
  Administered 2018-10-02: 20 mg via INTRAVENOUS
  Filled 2018-10-02: qty 50

## 2018-10-02 MED ORDER — SODIUM CHLORIDE 0.9 % IV SOLN
15.0000 mg/kg | Freq: Once | INTRAVENOUS | Status: AC
  Administered 2018-10-02: 800 mg via INTRAVENOUS
  Filled 2018-10-02: qty 32

## 2018-10-02 MED ORDER — SODIUM CHLORIDE 0.9 % IV SOLN
175.0000 mg/m2 | Freq: Once | INTRAVENOUS | Status: AC
  Administered 2018-10-02: 270 mg via INTRAVENOUS
  Filled 2018-10-02: qty 45

## 2018-10-02 MED ORDER — DIPHENHYDRAMINE HCL 50 MG/ML IJ SOLN
50.0000 mg | Freq: Once | INTRAMUSCULAR | Status: AC
  Administered 2018-10-02: 50 mg via INTRAVENOUS
  Filled 2018-10-02: qty 1

## 2018-10-02 NOTE — Progress Notes (Signed)
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 She presented with symptoms of bilateral lower extremity swelling as well as abdominal distention.GI. This was followed by a CT abdomen and chest imaging with contrast. CT showed cystic lesions of the enlarged bilateral ovaries the largest lesion on the left measuring 6.2 cm. Patient also noted to have numerous bulky retroperitoneal lymph nodes, largest left periaortic node or conglomerates near the superior mesenteric artery measuring at least 4.4 x 2.9 cm. Significant ascites was present. Patient underwent paracentesis and cytology from the fluid showed high-grade serous carcinoma. Tumor cells were positive for CK7, PAX 8, p53 and WT 1.  Patient seen by GYN onc and neoadjuvant chemotherapy recommended due to buly retroperitoneal adenopathy  Cycle 1 of carbotaxol Avastin neoadjuvant chemotherapy started on 08/21/2018   Interval history-she reports eating 3 meals a day along with 3 ensures.  She does feel fatigued 1 or 2 days after chemotherapy but is otherwise tolerating chemotherapy well.  Denies any significant tingling numbness in her extremities  ECOG PS- 1 Pain scale- 0   Review of systems- Review of Systems  Constitutional: Positive for malaise/fatigue. Negative for chills, fever and weight loss.  HENT: Negative for  congestion, ear discharge and nosebleeds.   Eyes: Negative for blurred vision.  Respiratory: Negative for cough, hemoptysis, sputum production, shortness of breath and wheezing.   Cardiovascular: Negative for chest pain, palpitations, orthopnea and claudication.  Gastrointestinal: Negative for abdominal pain, blood in stool, constipation, diarrhea, heartburn, melena, nausea and vomiting.  Genitourinary: Negative for dysuria, flank pain, frequency, hematuria and urgency.  Musculoskeletal: Negative for back pain, joint pain and myalgias.  Skin: Negative for rash.  Neurological: Negative for dizziness, tingling, focal weakness, seizures, weakness and headaches.  Endo/Heme/Allergies: Does not bruise/bleed easily.  Psychiatric/Behavioral: Negative for depression and suicidal ideas. The patient does not have insomnia.       Allergies  Allergen Reactions  . Clarithromycin Nausea Only  . Doxycycline     nausea, dizziness.  Marland Kitchen Penicillins Hives     Past Medical History:  Diagnosis Date  . Allergy   . Diabetes mellitus without complication ([place])   . Diverticulosis   . Family history of lung cancer   . Hyperlipidemia   . Hypothyroidism   . Thyroid disease      Past Surgical History:  Procedure Laterality Date  . BACK SURGERY  2007   Lumbar Surgery  . BREAST CYST ASPIRATION Right    Around 10 years ago. Pt thinks it was the right breast but not sure.  Marland Kitchen CATARACT EXTRACTION Left 2010   Right Eye 2011  . COLONOSCOPY WITH PROPOFOL N/A 09/02/2015   Procedure: COLONOSCOPY WITH PROPOFOL;  Surgeon: [name], MD;  Location: [place];  Service: Endoscopy;  Laterality: N/A;  Diabetic - oral meds  . ESOPHAGOGASTRODUODENOSCOPY (EGD) WITH PROPOFOL N/A 11/03/2015   Procedure: ESOPHAGOGASTRODUODENOSCOPY (EGD) WITH PROPOFOL;  Surgeon: [name], MD;  Location: [place];  Service: Endoscopy;  Laterality: N/A;  . EYE SURGERY Bilateral 06/2014    tear duck catherization ( Dr.  Vallarie Mare)  . POLYPECTOMY  09/02/2015   Procedure: POLYPECTOMY;  Surgeon: [name], MD;  Location: [place];  Service: Endoscopy;;  . TONSILLECTOMY  1963    Social History   Socioeconomic History  . Marital status: Single    Spouse name: Not on file  . Number of children: Not on file  . Years of education: Not on file  . Highest education level: Not on file  Occupational History  . Not on file  Social Needs  . Financial resource strain: Not on file  . Food insecurity    Worry: Not on file    Inability: Not on file  . Transportation needs    Medical: Not on file    Non-medical: Not on file  Tobacco Use  . Smoking status: Never Smoker  . Smokeless tobacco: Never Used  Substance and Sexual Activity  . Alcohol use: No  . Drug use: No  . Sexual activity: Not on file  Lifestyle  . Physical activity    Days per week: Not on file    Minutes per session: Not on file  . Stress: Not on file  Relationships  . Social Herbalist on phone: Not on file    Gets together: Not on file    Attends religious service: Not on file    Active member of club or organization: Not on file    Attends meetings of clubs or organizations: Not on file    Relationship status: Not on file  . Intimate partner violence    Fear of current or ex partner: Not on file    Emotionally abused: Not on file    Physically abused: Not on file    Forced sexual activity: Not on file  Other Topics Concern  . Not on file  Social History Narrative  . Not on file    Family History  Problem Relation Age of Onset  . Congestive Heart Failure Mother   . Diabetes Mother   . Stroke Mother   . Hypothyroidism Father   . Cancer Father        lung cancer  . Cancer Paternal Grandmother        stomach     Current Outpatient Medications:  .  aspirin 81 MG tablet, ASPIRIN, 81MG (Oral Tablet Delayed Release)  1 a week for 0 days  Quantity: 0.00;  Refills: 0   Ordered :02-Oct-2010  Doy Hutching ;  Started 12-Mar-2007 Active, Disp: , Rfl:  .  calcipotriene (DOVONOX) 0.005 % ointment, CALCIPOTRIENE, 0.005% (External Ointment) - Historical Medication  apply to skin daily (0.005 %) Active, Disp: , Rfl:  .  clobetasol ointment (TEMOVATE) 0.05 %, CLOBETASOL PROPIONATE, 0.05% (External Ointment) - Historical Medication  apply to skin daily (0.05 %) Active, Disp: , Rfl:  .  dexamethasone (DECADRON) 4 MG tablet, Take 2 tablets (8 mg total) by mouth daily. Start the day after carboplatin chemotherapy for 3 days., Disp: 30 tablet, Rfl: 1 .  fexofenadine (ALLEGRA) 60 MG tablet, Take 60 mg by mouth daily. Reported on 09/01/2015, Disp: , Rfl:  .  furosemide (LASIX) 20 MG tablet, Take 1 tablet (20 mg total) by mouth daily., Disp: 20 tablet, Rfl: 0 .  glipiZIDE (GLUCOTROL) 5 MG tablet, TAKE 1 TABLET BY MOUTH DAILY BEFORE BREAKFAST, Disp: 90 tablet, Rfl: 1 .  glucose blood (ONE TOUCH ULTRA  TEST) test strip, USE TO TEST BLOOD SUGAR TWICE A DAY AS INSTRUCTED, Disp: 100 each, Rfl: 12 .  levothyroxine (SYNTHROID) 100 MCG tablet, Take 1 tablet (100 mcg total) by mouth daily., Disp: 90 tablet, Rfl: 1 .  lidocaine-prilocaine (EMLA) cream, Apply to affected area once, Disp: 30 g, Rfl: 3 .  LORazepam (ATIVAN) 0.5 MG tablet, Take 1 tablet (0.5 mg total) by mouth every 6 (six) hours as needed (Nausea or vomiting)., Disp: 30 tablet, Rfl: 0 .  metFORMIN (GLUCOPHAGE) 500 MG tablet, Take 2 tablets (1,000 mg total) by mouth 2 (two) times daily., Disp: 360 tablet, Rfl: 1 .  MULTIPLE VITAMIN PO, MULTIVITAMINS (Oral Tablet)  1 Every Day for 0 days  Quantity: 0.00;  Refills: 0   Ordered :02-Oct-2010  Doy Hutching ;  Started 12-Mar-2007 Active, Disp: , Rfl:  .  ondansetron (ZOFRAN) 8 MG tablet, Take 1 tablet (8 mg total) by mouth 2 (two) times daily as needed for refractory nausea / vomiting. Start on day 3 after carboplatin chemo., Disp: 30 tablet, Rfl: 1 .  ONETOUCH DELICA LANCETS 66Z MISC, USE TO TEST BLOOD SUGAR  ONCE A DAY, Disp: 100 each, Rfl: 12 .  prochlorperazine (COMPAZINE) 10 MG tablet, Take 1 tablet (10 mg total) by mouth every 6 (six) hours as needed (Nausea or vomiting)., Disp: 30 tablet, Rfl: 1 .  simvastatin (ZOCOR) 20 MG tablet, Take 1 tablet (20 mg total) by mouth at bedtime., Disp: 90 tablet, Rfl: 1 .  spironolactone (ALDACTONE) 50 MG tablet, Take 1 tablet (50 mg total) by mouth daily., Disp: 20 tablet, Rfl: 0  Physical exam:  Vitals:   10/02/18 0841  BP: 112/76  Pulse: 93  Resp: 16  Temp: 98.1 F (36.7 C)  TempSrc: Tympanic  Weight: 111 lb 6.4 oz (50.5 kg)  Height: 5' 3" (1.6 m)   Physical Exam HENT:     Head: Normocephalic and atraumatic.  Eyes:     Pupils: Pupils are equal, round, and reactive to light.  Neck:     Musculoskeletal: Normal range of motion.  Cardiovascular:     Rate and Rhythm: Normal rate and regular rhythm.     Heart sounds: Normal heart sounds.  Pulmonary:     Effort: Pulmonary effort is normal.     Breath sounds: Normal breath sounds.  Abdominal:     General: Bowel sounds are normal.     Comments: Firm and mildly distended but ascites appears improved  Musculoskeletal:     Right lower leg: Edema (Edema improved compared to before) present.     Left lower leg: Edema present.  Skin:    General: Skin is warm and dry.  Neurological:     Mental Status: She is alert and oriented to person, place, and time.      CMP Latest Ref Rng & Units 09/11/2018  Glucose 70 - 99 mg/dL 134(H)  BUN 8 - 23 mg/dL 16  Creatinine 0.44 - 1.00 mg/dL 0.54  Sodium 135 - 145 mmol/L 137  Potassium 3.5 - 5.1 mmol/L 4.0  Chloride 98 - 111 mmol/L 99  CO2 22 - 32 mmol/L 28  Calcium 8.9 - 10.3 mg/dL 9.6  Total Protein 6.5 - 8.1 g/dL 7.7  Total Bilirubin 0.3 - 1.2 mg/dL 0.4  Alkaline Phos 38 - 126 U/L 83  AST 15 - 41 U/L 34  ALT 0 - 44 U/L 24   CBC Latest Ref Rng & Units 10/02/2018  WBC 4.0 - 10.5 K/uL 10.6(H)  Hemoglobin  12.0 - 15.0 g/dL 9.0(L)  Hematocrit 36.0 - 46.0  % 27.3(L)  Platelets 150 - 400 K/uL 207      Assessment and plan- Patient is a 63 y.o. female with serous adenocarcinoma bilateral ovaries at least stage III cT3 N2 M0.  She is here for on treatment assessment prior to cycle 3 of carbotaxol Avastin chemotherapy  Counts okay to proceed with cycle 3 of carbotaxol Avastin chemotherapy today with on pro-Neulasta support.  I will be obtaining repeat CT chest abdomen and pelvis with contrast in 2 weeks time and she will be seen by GYN oncology following the scans to see if she would be a surgical candidate at this time.  If plan is to proceed with surgery after 3 treatments, she will complete her remaining 3 cycles of chemotherapy after surgery.  Normocytic anemia: Likely secondary to malignancy.  Continue to monitor   Visit Diagnosis 1. Serous adenocarcinoma ([place])   2. Encounter for antineoplastic chemotherapy   3. Normocytic anemia      Dr. Randa Evens, MD, MPH [facility] [phone] 10/02/2018 8:59 AM

## 2018-10-02 NOTE — Progress Notes (Signed)
[place]  [phone] [phone]   Name: Meredith Jones Date: 10/02/2018 MRN: [id number]  DOB: 1956/01/14  Patient Care Team: Mar Daring, PA-C as PCP - General (Family Medicine) Clent Jacks, RN as Oncology Nurse Navigator    REASON FOR CONSULTATION: Palliative Care consult requested for this 63 y.o. 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  Abdominal CT revealed cystic lesions in the bilateral ovaries with numerous bulky retroperitoneal lymph nodes and a large amount of ascites.  She has required therapeutic paracentesis.  Patient is being followed by GYN oncology with plan for neoadjuvant chemotherapy and possible later surgery.  Patient was referred to palliative care to help address goals and manage ongoing symptoms.   SOCIAL HISTORY:     reports that she has never smoked. She has never used smokeless tobacco. She reports that she does not drink alcohol or use drugs.   Patient is not married and has no children.  Her parents are deceased and she has no siblings.  She lives at home with a boyfriend.  Patient previously worked as a Scientist, water quality at [employer].  ADVANCE DIRECTIVES:  Does not have  CODE STATUS: Full Code (MOST form completed on 10/02/18)  PAST MEDICAL HISTORY: Past Medical History:  Diagnosis Date   Allergy    Diabetes mellitus without complication ([place])    Diverticulosis    Family history of lung cancer    Hyperlipidemia    Hypothyroidism    Thyroid disease     PAST SURGICAL HISTORY:  Past Surgical History:  Procedure Laterality Date   BACK SURGERY  2007   Lumbar Surgery   BREAST CYST ASPIRATION Right    Around 10 years ago. Pt thinks it was the right breast but not sure.   CATARACT EXTRACTION Left 2010   Right Eye 2011   COLONOSCOPY WITH PROPOFOL N/A 09/02/2015   Procedure:  COLONOSCOPY WITH PROPOFOL;  Surgeon: [name], MD;  Location: [place];  Service: Endoscopy;  Laterality: N/A;  Diabetic - oral meds   ESOPHAGOGASTRODUODENOSCOPY (EGD) WITH PROPOFOL N/A 11/03/2015   Procedure: ESOPHAGOGASTRODUODENOSCOPY (EGD) WITH PROPOFOL;  Surgeon: [name], MD;  Location: [place];  Service: Endoscopy;  Laterality: N/A;   EYE SURGERY Bilateral 06/2014    tear duck catherization ( Dr. Vallarie Mare)   POLYPECTOMY  09/02/2015   Procedure: POLYPECTOMY;  Surgeon: [name], MD;  Location: [place];  Service: Endoscopy;;   TONSILLECTOMY  1963    HEMATOLOGY/ONCOLOGY HISTORY:  Oncology History  Serous adenocarcinoma ([place])  08/13/2018 Initial Diagnosis   Serous adenocarcinoma ([place])   08/14/2018 Cancer Staging   Staging form: Exocrine Pancreas, AJCC 8th Edition - Clinical stage from 08/14/2018: Stage III (cT3, cN2, cM0) - Signed by Sindy Guadeloupe, MD on 08/17/2018   08/21/2018 -  Chemotherapy   The patient had palonosetron (ALOXI) injection 0.25 mg, 0.25 mg, Intravenous,  Once, 3 of 6 cycles Administration: 0.25 mg (08/21/2018), 0.25 mg (09/11/2018) pegfilgrastim (NEULASTA ONPRO KIT) injection 6 mg, 6 mg, Subcutaneous, Once, 3 of 6 cycles Administration: 6 mg (08/21/2018), 6 mg (09/11/2018) bevacizumab (AVASTIN) 800 mg in sodium chloride 0.9 % 100 mL chemo infusion, 15 mg/kg = 800 mg (100 % of original dose 15 mg/kg), Intravenous,  Once, 1 of 1 cycle Dose modification: 15 mg/kg (original dose 15 mg/kg, Cycle 1) Administration: 800 mg (08/21/2018) CARBOplatin (PARAPLATIN) 430 mg in sodium chloride  0.9 % 250 mL chemo infusion, 430 mg (100 % of original dose 429.5 mg), Intravenous,  Once, 3 of 6 cycles Dose modification:   (original dose 429.5 mg, Cycle 1) Administration: 430 mg (08/21/2018), 430 mg (09/11/2018) PACLitaxel (TAXOL) 270 mg in sodium chloride 0.9 % 250 mL chemo infusion (> 60m/m2), 175 mg/m2 = 270 mg, Intravenous,  Once, 3 of 6  cycles Administration: 270 mg (08/21/2018), 270 mg (09/11/2018) fosaprepitant (EMEND) 150 mg, dexamethasone (DECADRON) 12 mg in sodium chloride 0.9 % 145 mL IVPB, , Intravenous,  Once, 3 of 6 cycles Administration:  (08/21/2018),  (09/11/2018) bevacizumab-awwb (MVASI) 800 mg in sodium chloride 0.9 % 100 mL chemo infusion, 15 mg/kg = 800 mg (100 % of original dose 15 mg/kg), Intravenous,  Once, 2 of 5 cycles Dose modification: 15 mg/kg (original dose 15 mg/kg, Cycle 2) Administration: 800 mg (09/11/2018)  for chemotherapy treatment.      ALLERGIES:  is allergic to clarithromycin; doxycycline; and penicillins.  MEDICATIONS:  Current Outpatient Medications  Medication Sig Dispense Refill   aspirin 81 MG tablet ASPIRIN, 81MG (Oral Tablet Delayed Release)  1 a week for 0 days  Quantity: 0.00;  Refills: 0   Ordered :02-Oct-2010  CDoy Hutching;  Started 12-Mar-2007 Active     calcipotriene (DOVONOX) 0.005 % ointment Apply 1 application topically as needed. CALCIPOTRIENE, 0.005% (External Ointment) - Historical Medication  apply to skin daily (0.005 %) Active     dexamethasone (DECADRON) 4 MG tablet Take 2 tablets (8 mg total) by mouth daily. Start the day after carboplatin chemotherapy for 3 days. 30 tablet 1   fexofenadine (ALLEGRA) 60 MG tablet Take 60 mg by mouth daily as needed. Reported on 09/01/2015     furosemide (LASIX) 20 MG tablet Take 1 tablet (20 mg total) by mouth daily. 20 tablet 0   glipiZIDE (GLUCOTROL) 5 MG tablet TAKE 1 TABLET BY MOUTH DAILY BEFORE BREAKFAST 90 tablet 1   glucose blood (ONE TOUCH ULTRA TEST) test strip USE TO TEST BLOOD SUGAR TWICE A DAY AS INSTRUCTED 100 each 12   levothyroxine (SYNTHROID) 100 MCG tablet Take 1 tablet (100 mcg total) by mouth daily. 90 tablet 1   lidocaine-prilocaine (EMLA) cream Apply to affected area once 30 g 3   LORazepam (ATIVAN) 0.5 MG tablet Take 1 tablet (0.5 mg total) by mouth every 6 (six) hours as needed (Nausea or vomiting). 30  tablet 0   metFORMIN (GLUCOPHAGE) 500 MG tablet Take 2 tablets (1,000 mg total) by mouth 2 (two) times daily. 360 tablet 1   MULTIPLE VITAMIN PO MULTIVITAMINS (Oral Tablet)  1 Every Day for 0 days  Quantity: 0.00;  Refills: 0   Ordered :02-Oct-2010  CDoy Hutching;  Started 12-Mar-2007 Active     ondansetron (ZOFRAN) 8 MG tablet Take 1 tablet (8 mg total) by mouth 2 (two) times daily as needed for refractory nausea / vomiting. Start on day 3 after carboplatin chemo. (Patient not taking: Reported on 10/02/2018) 30 tablet 1   ONETOUCH DELICA LANCETS 312YMISC USE TO TEST BLOOD SUGAR ONCE A DAY 100 each 12   prochlorperazine (COMPAZINE) 10 MG tablet Take 1 tablet (10 mg total) by mouth every 6 (six) hours as needed (Nausea or vomiting). (Patient not taking: Reported on 10/02/2018) 30 tablet 1   simvastatin (ZOCOR) 20 MG tablet Take 1 tablet (20 mg total) by mouth at bedtime. 90 tablet 1   spironolactone (ALDACTONE) 50 MG tablet Take 1 tablet (50 mg total) by mouth  daily. 20 tablet 0   No current facility-administered medications for this visit.    Facility-Administered Medications Ordered in Other Visits  Medication Dose Route Frequency Provider Last Rate Last Dose   CARBOplatin (PARAPLATIN) 430 mg in sodium chloride 0.9 % 250 mL chemo infusion  430 mg Intravenous Once Sindy Guadeloupe, MD       PACLitaxel (TAXOL) 270 mg in sodium chloride 0.9 % 250 mL chemo infusion (> 95m/m2)  175 mg/m2 (Treatment Plan Recorded) Intravenous Once RSindy Guadeloupe MD 98 mL/hr at 10/02/18 1211 270 mg at 10/02/18 1211   pegfilgrastim (NEULASTA ONPRO KIT) injection 6 mg  6 mg Subcutaneous Once RSindy Guadeloupe MD        VITAL SIGNS: There were no vitals taken for this visit. There were no vitals filed for this visit.  Estimated body mass index is 19.73 kg/m as calculated from the following:   Height as of an earlier encounter on 10/02/18: _0  (1.6 m).   Weight as of an earlier encounter on 10/02/18: 111 lb  6.4 oz (50.5 kg).  LABS: CBC:    Component Value Date/Time   WBC 10.6 (H) 10/02/2018 0810   HGB 9.0 (L) 10/02/2018 0810   HGB 10.2 (L) 05/31/2017 0836   HCT 27.3 (L) 10/02/2018 0810   HCT 31.4 (L) 05/31/2017 0836   PLT 207 10/02/2018 0810   PLT 374 05/31/2017 0836   MCV 105.0 (H) 10/02/2018 0810   MCV 100 (H) 05/31/2017 0836   NEUTROABS 8.5 (H) 10/02/2018 0810   NEUTROABS 3.4 05/31/2017 0836   LYMPHSABS 1.1 10/02/2018 0810   LYMPHSABS 1.0 05/31/2017 0836   MONOABS 0.8 10/02/2018 0810   EOSABS 0.1 10/02/2018 0810   EOSABS 0.4 05/31/2017 0836   BASOSABS 0.1 10/02/2018 0810   BASOSABS 0.0 05/31/2017 0836   Comprehensive Metabolic Panel:    Component Value Date/Time   NA 138 10/02/2018 0810   NA 142 05/31/2017 0836   K 4.3 10/02/2018 0810   CL 101 10/02/2018 0810   CO2 27 10/02/2018 0810   BUN 19 10/02/2018 0810   BUN 9 05/31/2017 0836   CREATININE 0.61 10/02/2018 0810   CREATININE 0.57 11/13/2016 0933   GLUCOSE 160 (H) 10/02/2018 0810   CALCIUM 9.5 10/02/2018 0810   AST 30 10/02/2018 0810   ALT 23 10/02/2018 0810   ALKPHOS 88 10/02/2018 0810   BILITOT 0.5 10/02/2018 0810   BILITOT <0.2 05/31/2017 0836   PROT 8.3 (H) 10/02/2018 0810   PROT 6.1 05/31/2017 0836   ALBUMIN 3.7 10/02/2018 0810   ALBUMIN 3.4 (L) 05/31/2017 0836    RADIOGRAPHIC STUDIES: No results found.  PERFORMANCE STATUS (ECOG) : 0 - Asymptomatic  Review of Systems Unless otherwise noted, a complete review of systems is negative.  Physical Exam General: NAD,  thin Pulmonary: Unlabored breathing Abdomen: Distended Extremities: Bilateral lower extremity edema Skin: no rashes Neurological: Weakness but otherwise nonfocal  IMPRESSION: Patient seen in the clinic for routine follow-up.  She reports that she is doing reasonably well without acute changes or concerns.  She feels that she is eating well but has noted continued weight loss.  Will refer her to see dietitian.  Patient says she is  eating a lot of vegetables and I encouraged her not to fill out on low calorie foods but also focus on calorie dense and foods high in protein.  Patient denies distressing symptoms.  Patient says that she is trying to complete ACP documents but that her boyfriend has  been unwilling to discuss those topics with her.  We reviewed and completed a MOST Form today.  Patient says that she would want an attempt made at resuscitation but would not want to be sustained long-term on a ventilator.  She says she would be willing to consider less aggressive measures in the event that disease progression.   I completed a MOST form today. The patient outlined their wishes for the following treatment decisions:  Cardiopulmonary Resuscitation: Attempt Resuscitation (CPR)  Medical Interventions: Full Scope of Treatment: Use intubation, advanced airway interventions, mechanical ventilation, cardioversion as indicated, medical treatment, IV fluids, etc, also provide comfort measures. Transfer to the hospital if indicated  Antibiotics: Antibiotics if indicated  IV Fluids: IV fluids if indicated  Feeding Tube: Feeding tube long-term if indicated     PLAN: -Continue current scope of treatment -Full code/Full scope (MOST form completed) -RTC in 1 month   Patient expressed understanding and was in agreement with this plan. She also understands that She can call the clinic at any time with any questions, concerns, or complaints.     Time Total: 30 minutes  Visit consisted of counseling and education dealing with the complex and emotionally intense issues of symptom management and palliative care in the setting of serious and potentially life-threatening illness.Greater than 50%  of this time was spent counseling and coordinating care related to the above assessment and plan.  Signed by: Altha Harm, PhD, NP-C [phone] (Work Cell)

## 2018-10-02 NOTE — Progress Notes (Signed)
Lost wt. But she states she is eating good, she drinks 2 boost a day. I encouraged her to drink 3 a day. She does not eat meat so the extra protein in the drink should help. Her bowels are good and she feels good today

## 2018-10-02 NOTE — Addendum Note (Signed)
Addended by: Randa Evens C on: 10/02/2018 10:30 AM   Modules accepted: Orders

## 2018-10-02 NOTE — Progress Notes (Signed)
Nutrition Assessment   Reason for Assessment:   Referral from [place], NP Palliative care for weight loss.    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  Patient is receiving chemotherapy with plans for surgery and adjuvant chemotherapy.  Past medical history of DM, HLD, hypothyroidism.   Met with patient during infusion this pm.  Patient reports that she has a good appetite.  Reports that she mainly eats vegetables, nuts, peanut butter, grillers (soy burgers), low calorie yogurt.  Does occasionally eat chicken and beef.  Drinks boost glucose control or original boost about 2 times per day.  Reports breakfast is usually a peanut butter sandwich with sugar free jelly and low calorie chocolate bar.  Lunch is mainly 2 vegetables and nuts sometimes will eat a griller.    Reports some diarrhea with eating spicy foods but overall has regular bowel movements  Nutrition Focused Physical Exam: deferred   Medications: lasix, metformin, zofran, MVI, decadron   Labs: glucose 160   Anthropometrics:   Height: 63 inches Weight: 111 lb 6.4 oz today 115 lb on 7/30, 118 lb on 08/08/2018 BMI: 19  6% weight loss in the last 2 months  Estimated Energy Needs  Kcals: 1500-1750 calories Protein: 75-88 g Fluid: 1.7 L   NUTRITION DIAGNOSIS: Inadequate oral intake related to eating low calorie, low protein foods as evidenced by continued weight loss 6% in last 2 months   INTERVENTION:  Discussed strategies to increase calories and protein. Handout given.  Provided examples of ways to increase calories without increasing blood glucose.  However, would not want to restrict diet secondary to weight loss but control blood glucose with adjusting medication.   Encouraged eating protein source at every meal.   Encouraged continued [place] of oral nutrition supplements between meals.   Contact information provided.     MONITORING, EVALUATION, GOAL: Patient will consume adequate  calories and protein to prevent weight loss.   Next Visit: phone f/u 9/10  Maxine Fredman B. Zenia Resides, [place], [place] Registered Dietitian [phone] (pager)

## 2018-10-02 NOTE — Progress Notes (Signed)
Noted that CT is scheduled for 10/16/18. Appointment arranged with gyn onc to evaluate for surgery 10/22/18

## 2018-10-03 LAB — CA 125: Cancer Antigen (CA) 125: 78.5 U/mL — ABNORMAL HIGH (ref 0.0–38.1)

## 2018-10-07 ENCOUNTER — Telehealth: Payer: Self-pay | Admitting: Physician Assistant

## 2018-10-07 DIAGNOSIS — E119 Type 2 diabetes mellitus without complications: Secondary | ICD-10-CM

## 2018-10-07 MED ORDER — METFORMIN HCL 500 MG PO TABS: 1000.0000 mg | ORAL_TABLET | Freq: Two times a day (BID) | ORAL | 0 refills | Status: DC

## 2018-10-07 NOTE — Telephone Encounter (Signed)
Pt needs refill on Metformin 500 mg  [place]

## 2018-10-07 NOTE — Telephone Encounter (Signed)
Please review

## 2018-10-16 ENCOUNTER — Ambulatory Visit
Admission: RE | Admit: 2018-10-16 | Discharge: 2018-10-16 | Disposition: A | Discharge status: Home / Self Care | Payer: Commercial Managed Care - PPO | Source: Ambulatory Visit | Attending: Oncology | Admitting: Oncology

## 2018-10-16 ENCOUNTER — Other Ambulatory Visit: Payer: Self-pay

## 2018-10-16 DIAGNOSIS — C801 Malignant (primary) neoplasm, unspecified: Secondary | ICD-10-CM | POA: Insufficient documentation

## 2018-10-16 MED ORDER — IOHEXOL 300 MG/ML  SOLN
75.0000 mL | Freq: Once | INTRAMUSCULAR | Status: AC | PRN
  Administered 2018-10-16: 75 mL via INTRAVENOUS

## 2018-10-22 ENCOUNTER — Inpatient Hospital Stay: Payer: Commercial Managed Care - PPO | Attending: Obstetrics and Gynecology | Admitting: Obstetrics and Gynecology

## 2018-10-22 ENCOUNTER — Encounter: Payer: Self-pay | Admitting: Obstetrics and Gynecology

## 2018-10-22 ENCOUNTER — Other Ambulatory Visit: Payer: Self-pay | Admitting: Oncology

## 2018-10-22 ENCOUNTER — Other Ambulatory Visit: Payer: Self-pay

## 2018-10-22 VITALS — BP 143/87 | HR 91 | Temp 98.8°F | Resp 20 | Ht 63.0 in | Wt 110.4 lb

## 2018-10-22 DIAGNOSIS — Z7982 Long term (current) use of aspirin: Secondary | ICD-10-CM | POA: Diagnosis not present

## 2018-10-22 DIAGNOSIS — R05 Cough: Secondary | ICD-10-CM | POA: Diagnosis not present

## 2018-10-22 DIAGNOSIS — I2510 Atherosclerotic heart disease of native coronary artery without angina pectoris: Secondary | ICD-10-CM | POA: Insufficient documentation

## 2018-10-22 DIAGNOSIS — E538 Deficiency of other specified B group vitamins: Secondary | ICD-10-CM | POA: Insufficient documentation

## 2018-10-22 DIAGNOSIS — R590 Localized enlarged lymph nodes: Secondary | ICD-10-CM | POA: Diagnosis not present

## 2018-10-22 DIAGNOSIS — C561 Malignant neoplasm of right ovary: Secondary | ICD-10-CM | POA: Diagnosis present

## 2018-10-22 DIAGNOSIS — D638 Anemia in other chronic diseases classified elsewhere: Secondary | ICD-10-CM | POA: Insufficient documentation

## 2018-10-22 DIAGNOSIS — Z5189 Encounter for other specified aftercare: Secondary | ICD-10-CM | POA: Diagnosis not present

## 2018-10-22 DIAGNOSIS — R197 Diarrhea, unspecified: Secondary | ICD-10-CM | POA: Insufficient documentation

## 2018-10-22 DIAGNOSIS — R634 Abnormal weight loss: Secondary | ICD-10-CM | POA: Insufficient documentation

## 2018-10-22 DIAGNOSIS — E1136 Type 2 diabetes mellitus with diabetic cataract: Secondary | ICD-10-CM | POA: Insufficient documentation

## 2018-10-22 DIAGNOSIS — C801 Malignant (primary) neoplasm, unspecified: Secondary | ICD-10-CM | POA: Diagnosis not present

## 2018-10-22 DIAGNOSIS — R32 Unspecified urinary incontinence: Secondary | ICD-10-CM | POA: Diagnosis not present

## 2018-10-22 DIAGNOSIS — Z5111 Encounter for antineoplastic chemotherapy: Secondary | ICD-10-CM | POA: Insufficient documentation

## 2018-10-22 DIAGNOSIS — C562 Malignant neoplasm of left ovary: Secondary | ICD-10-CM | POA: Insufficient documentation

## 2018-10-22 DIAGNOSIS — I700 Atherosclerosis of aorta: Secondary | ICD-10-CM | POA: Diagnosis not present

## 2018-10-22 DIAGNOSIS — Z79899 Other long term (current) drug therapy: Secondary | ICD-10-CM | POA: Insufficient documentation

## 2018-10-22 DIAGNOSIS — Z7984 Long term (current) use of oral hypoglycemic drugs: Secondary | ICD-10-CM | POA: Diagnosis not present

## 2018-10-22 DIAGNOSIS — E7800 Pure hypercholesterolemia, unspecified: Secondary | ICD-10-CM | POA: Insufficient documentation

## 2018-10-22 DIAGNOSIS — E039 Hypothyroidism, unspecified: Secondary | ICD-10-CM | POA: Insufficient documentation

## 2018-10-22 DIAGNOSIS — Z23 Encounter for immunization: Secondary | ICD-10-CM | POA: Insufficient documentation

## 2018-10-22 DIAGNOSIS — D6481 Anemia due to antineoplastic chemotherapy: Secondary | ICD-10-CM | POA: Insufficient documentation

## 2018-10-22 NOTE — Progress Notes (Signed)
Gynecologic Oncology Consult Visit   Referring Provider: Dr. Janese Jones  Chief Complaint: high grade serous adenocarcinoma  Subjective:  Meredith Jones is a 63 y.o. [place] female, diagnosed with high grade serous adenocarcinoma, s/p 3 cycles of carbo-taxol-bevacizumab who returns to clinic today for pelvic exam.  She received 3 cycles of carbo-taxol-bevacizumab 08/21/2018-10/02/2018. She presents today for consideration of interval debulking surgery.   CT C/A/P- 10/16/2018-  1. Today's study demonstrates a marked positive response to therapy with regression of bilateral ovarian lesions and resolution of previously noted retroperitoneal lymphadenopathy. Additionally, previously noted ascites has resolved. 2. No findings to suggest metastatic disease in the thorax. 3. Aortic atherosclerosis, in addition to left anterior descending coronary artery disease. Please note that although the presence of coronary artery calcium documents the presence of coronary artery disease, the severity of this disease and any potential stenosis cannot be assessed on this non-gated CT examination. Assessment for potential risk factor modification, dietary therapy or pharmacologic therapy may be warranted, if clinically indicated.  CA 125 08/08/2018 491.0 09/11/2018 200 10/02/2018 78.5  She has suffered anemia during chemotherapy. Last hemoglobin 9.0 on 10/02/2018. She says she feels well today and denies specific complaints. Her abdomen is much less distended. She is no longer working full time due to undergoing treatment but says she continues to be active.     Gynecologic Oncology History:  Meredith Jones is a pleasant Meredith Jones female, diagnosed with high grade serous adenocarcinoma.   She presented to ER on 07/23/2018 with symptoms of bilateral lower extremity swelling as well as abdominal distention. US abdomen revealed large volume ascites, no overt changes of hepatic cirrhosis and was referred to Meredith Jones. She  underwent US paracentesis on 07/29/2018. 5L of fluid was removed.   Pathology:  A. ABDOMINAL FLUID; PARACENTESIS:  - MALIGNANT CELLS PRESENT.  - ADENOCARCINOMA, MOST CONSISTENT WITH HIGH-GRADE SEROUS CARCINOMA.   08/04/2018- CT C/A/P IMPRESSION: 1. There are cystic lesions of the enlarged bilateral ovaries, largest lesion on the left measuring at least 6.2 cm (series 2, image 116, series 6, image 76). Findings are highly concerning for ovarian neoplasm, particularly given presence of otherwise unexplained ascites and abdominal lymphadenopathy.  2. There are numerous bulky retroperitoneal lymph nodes, largest left periaortic nodes or conglomerates near the superior mesenteric artery measuring at least 4.4 x 2.9 cm (series 2, image 64), concerning for metastatic disease, or alternately primary malignancy such as lymphoma.  Nonobstructive inferior pole left nephrolithiasis. Fusion of T7-T10.   She works as a Scientist, water quality.  She lives with her boyfriend.  She reports about 35 pound weight loss over the last 4 to 5 months but more significant weight loss of past several years.  Reports that her appetite is good and she is eating well but continues to lose weight.  Denies any pain at this time.  Reports ongoing fatigue. She saw Meredith Jones in 2013 for possible endometrial hyperplasia and underwent D/C. Very narrow vagina making visualization of cervix 'almost impossible'.   She saw Meredith Jones on 08/08/2018. Noted to have macrocytic anemia and found to have low B12 levels. TSH- 46.46; FT3 1.1, FT4 0.74. She has seen her endocrinologist and adjusted her levothyroxine. History of type 2 diabetes. Last hA1c was 6.5 (08/04/2018).   CA 125 08/08/2018 491  08/08/2018- albumin 3.1, hemoglobin 10.8, hct 33.8.    She presents today to discuss neoadjuvant chemotherapy vs primary debulking surgery.   Genetics Testing: Based on genetic counselor recommendations: "Since she has not had surgery yet,  and the tumor testing  must be performed at the same time as germline, we recommended waiting to do testing for Meredith Jones until she has had her surgery."   Problem List: Patient Active Problem List   Diagnosis Date Noted  . Anemia of chronic disease 10/22/2018  . Family history of lung cancer   . Serous adenocarcinoma ([place]) 08/13/2018  . Goals of care, counseling/discussion 08/11/2018  . Loss of weight   . Benign neoplasm of descending colon   . Type 2 diabetes mellitus without complication, without long-term current use of insulin ([place]) 06/07/2015  . 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 Cyst of ovary 06/09/2014  . Bulky or enlarged uterus 06/09/2014  . Hypercholesteremia 02/09/2007  . Allergic rhinitis 11/27/2006  . Endometriosis 11/27/2006  . Adult hypothyroidism 11/27/2006    Past Medical History: Past Medical History:  Diagnosis Date  . Allergy   . Diabetes mellitus without complication ([place])   . Diverticulosis   . Family history of lung cancer   . Hyperlipidemia   . Hypothyroidism   . Ovarian cancer ([place]) 08/04/2018   Chemo tx's.  . Thyroid disease     Past Surgical History: Past Surgical History:  Procedure Laterality Date  . BACK SURGERY  2007   Lumbar Surgery  . BREAST CYST ASPIRATION Right    Around 10 years ago. Pt thinks it was the right breast but not sure.  Marland Kitchen CATARACT EXTRACTION Left 2010   Right Eye 2011  . COLONOSCOPY WITH PROPOFOL N/A 09/02/2015   Procedure: COLONOSCOPY WITH PROPOFOL;  Surgeon: [name], MD;  Location: [facility];  Service: Endoscopy;  Laterality: N/A;  Diabetic - oral meds  . ESOPHAGOGASTRODUODENOSCOPY (EGD) WITH PROPOFOL N/A 11/03/2015   Procedure: ESOPHAGOGASTRODUODENOSCOPY (EGD) WITH PROPOFOL;  Surgeon: [name], MD;  Location: [facility];  Service: Endoscopy;  Laterality: N/A;  . EYE SURGERY Bilateral 06/2014    tear duck catherization ( Dr. Vallarie Mare)  . POLYPECTOMY  09/02/2015   Procedure: POLYPECTOMY;  Surgeon: [name],  MD;  Location: [facility];  Service: Endoscopy;;  . TONSILLECTOMY  1963    Past Gynecologic History:  As per HPI  OB History: P0 OB History  No obstetric history on file.    Family History: Family History  Problem Relation Age of Onset  . Congestive Heart Failure Mother   . Diabetes Mother   . Stroke Mother   . Hypothyroidism Father   . Cancer Father        lung cancer  . Cancer Paternal Grandmother        stomach    Social History: Social History   Socioeconomic History  . Marital status: Single    Spouse name: Not on file  . Number of children: Not on file  . Years of education: Not on file  . Highest education level: Not on file  Occupational History  . Not on file  Social Needs  . Financial resource strain: Not on file  . Food insecurity    Worry: Not on file    Inability: Not on file  . Transportation needs    Medical: Not on file    Non-medical: Not on file  Tobacco Use  . Smoking status: Never Smoker  . Smokeless tobacco: Never Used  Substance and Sexual Activity  . Alcohol use: No  . Drug use: No  . Sexual activity: Not on file  Lifestyle  . Physical activity    Days per week: Not on file  Minutes per session: Not on file  . Stress: Not on file  Relationships  . Social Herbalist on phone: Not on file    Gets together: Not on file    Attends religious service: Not on file    Active member of club or organization: Not on file    Attends meetings of clubs or organizations: Not on file    Relationship status: Not on file  . Intimate partner violence    Fear of current or ex partner: Not on file    Emotionally abused: Not on file    Physically abused: Not on file    Forced sexual activity: Not on file  Other Topics Concern  . Not on file  Social History Narrative  . Not on file    Allergies: Allergies  Allergen Reactions  . Clarithromycin Nausea Only  . Doxycycline     nausea, dizziness.  Marland Kitchen Penicillins Hives     Current Medications: Current Outpatient Medications  Medication Sig Dispense Refill  . aspirin 81 MG tablet ASPIRIN, 81MG  (Oral Tablet Delayed Release)  1 a week for 0 days  Quantity: 0.00;  Refills: 0   Ordered :02-Oct-2010  Doy Hutching ;  Started 12-Mar-2007 Active    . dexamethasone (DECADRON) 4 MG tablet Take 2 tablets (8 mg total) by mouth daily. Start the day after carboplatin chemotherapy for 3 days. 30 tablet 1  . glipiZIDE (GLUCOTROL) 5 MG tablet TAKE 1 TABLET BY MOUTH DAILY BEFORE BREAKFAST 90 tablet 1  . glucose blood (ONE TOUCH ULTRA TEST) test strip USE TO TEST BLOOD SUGAR TWICE A DAY AS INSTRUCTED 100 each 12  . levothyroxine (SYNTHROID) 100 MCG tablet Take 1 tablet (100 mcg total) by mouth daily. 90 tablet 1  . metFORMIN (GLUCOPHAGE) 500 MG tablet Take 2 tablets (1,000 mg total) by mouth 2 (two) times daily. 360 tablet 0  . MULTIPLE VITAMIN PO MULTIVITAMINS (Oral Tablet)  1 Every Day for 0 days  Quantity: 0.00;  Refills: 0   Ordered :02-Oct-2010  Doy Hutching ;  Started 12-Mar-2007 Active    . ondansetron (ZOFRAN) 8 MG tablet Take 1 tablet (8 mg total) by mouth 2 (two) times daily as needed for refractory nausea / vomiting. Start on day 3 after carboplatin chemo. 30 tablet 1  . ONETOUCH DELICA LANCETS 99991111 MISC USE TO TEST BLOOD SUGAR ONCE A DAY 100 each 12  . simvastatin (ZOCOR) 20 MG tablet Take 1 tablet (20 mg total) by mouth at bedtime. 90 tablet 1  . calcipotriene (DOVONOX) 0.005 % ointment Apply 1 application topically as needed. CALCIPOTRIENE, 0.005% (External Ointment) - Historical Medication  apply to skin daily (0.005 %) Active    . fexofenadine (ALLEGRA) 60 MG tablet Take 60 mg by mouth daily as needed. Reported on 09/01/2015    . furosemide (LASIX) 20 MG tablet Take 1 tablet (20 mg total) by mouth daily. (Patient not taking: Reported on 10/22/2018) 20 tablet 0  . lidocaine-prilocaine (EMLA) cream Apply to affected area once (Patient not taking: Reported on  10/22/2018) 30 g 3  . LORazepam (ATIVAN) 0.5 MG tablet Take 1 tablet (0.5 mg total) by mouth every 6 (six) hours as needed (Nausea or vomiting). (Patient not taking: Reported on 10/22/2018) 30 tablet 0  . prochlorperazine (COMPAZINE) 10 MG tablet Take 1 tablet (10 mg total) by mouth every 6 (six) hours as needed (Nausea or vomiting). (Patient not taking: Reported on 10/02/2018) 30 tablet 1  . spironolactone (ALDACTONE) 50  MG tablet Take 1 tablet (50 mg total) by mouth daily. (Patient not taking: Reported on 10/22/2018) 20 tablet 0   No current facility-administered medications for this visit.    Review of Systems General:  no complaints Skin: no complaints Eyes: no complaints HEENT: no complaints Breasts: no complaints Pulmonary: cough Cardiac: no complaints Gastrointestinal: diarrhea Genitourinary/Sexual: hx of chronic urinary incontinence since 2016 Ob/Gyn: no complaints Musculoskeletal: no complaints Hematology: no complaints Neurologic/Psych: no complaints   Objective:  Physical Examination:  BP (!) 143/87 (BP Location: Left Arm, Patient Position: Sitting)   Pulse 91   Temp 98.8 F (37.1 C) (Tympanic)   Resp 20   Ht 5\' 3"  (1.6 m)   Wt 110 lb 6.4 oz (50.1 kg)   BMI 19.56 kg/m     ECOG Performance Status: 1 - Symptomatic but completely ambulatory; she has stopped working  General appearance: alert, cooperative and appears stated age HEENT: atraumatic normocephalic Lymph node survey: non-palpable, axillary, inguinal, supraclavicular Cardiovascular: regular rate and rhythm Respiratory: Bilateral clear to ausculation Abdomen: Soft nontender. Slightly distended, ascites seems to have improved significantly. No hernias, no masses palpated  Extremities:No edema Neurological exam reveals alert, oriented, normal speech, no focal findings or movement disorder noted  Pelvic: Vulva: normal appearing vulva with no masses, tenderness or lesions; Vagina: normal;  Cervix:  no lesions,  nontender, mobile; Uterus: unable to determine size given ascites. No evidence of enlargement; BME: bilateral adnexal fullness but no definite mass, no nodularity; Rectal: confirms, no obvious rectal involvement.      Lab Review Labs on site today: Lab Results  Component Value Date   WBC 10.6 (H) 10/02/2018   HGB 9.0 (L) 10/02/2018   HCT 27.3 (L) 10/02/2018   MCV 105.0 (H) 10/02/2018   PLT 207 10/02/2018      Chemistry      Component Value Date/Time   NA 138 10/02/2018 0810   NA 142 05/31/2017 0836   K 4.3 10/02/2018 0810   CL 101 10/02/2018 0810   CO2 27 10/02/2018 0810   BUN 19 10/02/2018 0810   BUN 9 05/31/2017 0836   CREATININE 0.61 10/02/2018 0810   CREATININE 0.57 11/13/2016 0933   GLU 89 03/25/2013      Component Value Date/Time   CALCIUM 9.5 10/02/2018 0810   ALKPHOS 88 10/02/2018 0810   AST 30 10/02/2018 0810   ALT 23 10/02/2018 0810   BILITOT 0.5 10/02/2018 0810   BILITOT <0.2 05/31/2017 0836     Albumin 3.7   Radiologic Imaging: As per HPI and interval history  CT C/A/P- 10/16/2018-        Assessment:  Meredith Jones is a 63 y.o. 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 Bulky retroperitoneal adenopathy adjacent and surrounding superior mesenteric artery. S/p neoadjuvant chemotherapy with 3 cycles of carboplatin-paclitaxel-bevacizumab with excellent response based on CA125, imaging and exam.   Mild anemia.   Hypoalbuminemia, improved.   Medical co-morbidities complicating care:Diabetes mellitus without complication ([place]), Hyperlipidemia, Hypothyroid disease. Body mass index is 19.56 kg/m.   Plan:   Problem List Items Addressed This Visit      Other   Anemia of chronic disease   Serous adenocarcinoma ([place]) - Primary      We discussed proceeding with interval debulking surgery. Since she was given bevacizumab for cycle 3 we recommended delaying interval debulking. She can proceed with  cycle #4 tomorrow without bevacizumab. I spoke with Dr Meredith Jones regarding my recommendations for carboplatin  and paclitaxel only for cycle #4 and not to give bevacizumab. Plan to re-evaluate in clinic 11/19/2018 with surgery 11/26/2018 with robotic TLH BSO tumor debulking omentectomy possible bowel resection, possible ostomy, possible laparotomy. Her consent has already been obtained.   Hopefully, germline testing and somatic tumor testing will be completed soon.   Risks were discussed in detail. These include infection, anesthesia, bleeding, transfusion, wound separation, vaginal cuff dehiscence, medical issues (blood clots, stroke, heart attack, fluid in the lungs, pneumonia, abnormal heart rhythm, death), possible exploratory surgery with larger incision, lymphedema, lymphocyst, allergic reaction, injury to adjacent organs (bowel, bladder, blood vessels, nerves, ureters, uterus). She understands, her questions were addressed, and she agrees with this plan.   Gyn VTE Prophylaxis Algorithm  Risk factors for VTE (calculator):  Point value Risk factors  1 point each acute medical illness  2 points each age 22-74  3 points each none in this category  5 points each  none in this category   Major surgery (>30 min); known malignancy or concern for malignancy (elevated tumor markers); laparotomy; Very high risk - heparin/UFH and SCDs and extended prophylaxis for 28 days   The patient's diagnosis, an outline of the further diagnostic and laboratory studies which will be required, the recommendation for surgery, and alternatives were discussed with her and her accompanying family members.  All questions were answered to their satisfaction.  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  I personally had a face to face interaction and evaluated the patient  jointly with the NP, Ms. Beckey Rutter.  I have reviewed her history and available records and have performed the key portions of the physical exam including  lymph node survey, abdominal exam, pelvic exam with my findings confirming those documented above by the APP.  I have discussed the case with the APP and the patient.  I agree with the above documentation, assessment and plan which was fully formulated by me.  Counseling was completed by me.   I personally saw the patient and performed a substantive portion of this encounter in conjunction with the listed APP as documented above.  Angeles Gaetana Michaelis, MD  CC:  Meredith Jones

## 2018-10-22 NOTE — Patient Instructions (Signed)
FOLLOW THE MIRALAX/DULCOLAX BOWEL PREP        DIVISION OF GYNECOLOGIC ONCOLOGY BOWEL PREP   The following instructions are extremely important to prepare for your surgery. Please follow them carefully   Step 1: Liquid Diet Instructions              Clear Liquid Diet for GYN Oncology Patients Day Before Surgery The day before your scheduled surgery DO NOT EAT any solid foods.  We do want you to drink enough liquids, but NO MILK products.  We do not want you to be dehydrated.  Clear liquids are defined as no milk products and no pieces of any solid food. Drink at least 64 oz. of fluid.  The following are all approved for you to drink the day before you surgery.  Chicken, Beef or Vegetable Broth (bouillon or consomm) - NO BROTH AFTER MIDNIGHT  Plain Jello  (no fruit)  Water  Strained lemonade or fruit punch  Gatorade (any flavor)  CLEAR Ensure or Boost Breeze  Fruit juices without pulp, such as apple, grape, or cranberry juice  Clear sodas - NO SODA AFTER MIDNIGHT  Ice Pops without bits of fruit or fruit pulp  Honey  Tea or coffee without milk or cream                 Any foods not on the above list should be avoided                                                                                             Step 2: Laxatives           The evening before surgery:   Time: around 5pm   Follow these instructions carefully.   Administer 1 Dulcolax suppository according to manufacturer instructions on the box. You will need to purchase this laxative at a pharmacy or grocery store.    Individual responses to laxatives vary; this prep may cause multiple bowel movements. It often works in 30 minutes and may take as long as 3 hours. Stay near an available bathroom.    It is important to stay hydrated. Ensure you are still drinking clear liquids.     IMPORTANT: FOR YOUR SAFETY, WE WILL HAVE TO CANCEL YOUR SURGERY IF YOU DO NOT FOLLOW THESE INSTRUCTIONS.     Do not eat anything after midnight (including gum or candy) prior to your surgery.  Avoid drinking carbonated beverages after midnight.  You can have clear liquids up until one hour before you arrive at the hospital. "Nothing by mouth" means no liquids, gum, candy, etc for one hour before your arrival time.      Laparoscopy Laparoscopy is a procedure to diagnose diseases in the abdomen. During the procedure, a thin, lighted, pencil-sized instrument called a laparoscope is inserted into the abdomen through an incision. The laparoscope allows your health care provider to look at the organs inside your body. LET [facility] CARE PROVIDER KNOW ABOUT:  Any allergies you have.  All medicines you are taking, including vitamins, herbs, eye drops, creams, and over-the-counter medicines.  Previous problems you or members of  your family have had with the use of anesthetics.  Any blood disorders you have.  Previous surgeries you have had.  Medical conditions you have. RISKS AND COMPLICATIONS  Generally, this is a safe procedure. However, problems can occur, which may include:  Infection.  Bleeding.  Damage to other organs.  Allergic reaction to the anesthetics used during the procedure. BEFORE THE PROCEDURE  Do not eat or drink anything after midnight on the night before the procedure or as directed by your health care provider.  Ask your health care provider about: ? Changing or stopping your regular medicines. ? Taking medicines such as aspirin and ibuprofen. These medicines can thin your blood. Do not take these medicines before your procedure if your health care provider instructs you not to.  Plan to have someone take you home after the procedure. PROCEDURE  You may be given a medicine to help you relax (sedative).  You will be given a medicine to make you sleep (general anesthetic).  Your abdomen will be inflated with a gas. This will make your organs easier to  see.  Small incisions will be made in your abdomen.  A laparoscope and other small instruments will be inserted into the abdomen through the incisions.  A tissue sample may be removed from an organ in the abdomen for examination.  The instruments will be removed from the abdomen.  The gas will be released.  The incisions will be closed with stitches (sutures). AFTER THE PROCEDURE  Your blood pressure, heart rate, breathing rate, and blood oxygen level will be monitored often until the medicines you were given have worn off.   This information is not intended to replace advice given to you by your health care provider. Make sure you discuss any questions you have with your health care provider.                                             Bowel Symptoms After Surgery After gynecologic surgery, women often have temporary changes in bowel function (constipation and gas pain).  Following are tips to help prevent and treat common bowel problems.  It also tells you when to call the doctor.  This is important because some symptoms might be a sign of a more serious bowel problem such as obstruction (bowel blockage).  These problems are rare but can happen after gynecologic surgery.   Besides surgery, what can temporarily affect bowel function? 1. Dietary changes   2. Decreased physical activity   3.Antibiotics   4. Pain medication   How can I prevent constipation (three days or more without a stool)? 1. Include fiber in your diet: whole grains, raw or dried fruits & vegetables, prunes, prune/pear juiceDrink at least 8 glasses of liquid (preferably water) every day 2. Avoid: ? Gas forming foods such as broccoli, beans, peas, salads, cabbage, sweet potatoes ? Greasy, fatty, or fried foods 3. Activity helps bowel function return to normal, walk around the house at least 3-4 times each day for 15 minutes or longer, if tolerated.  Rocking in a rocking chair is preferable to sitting  still. 4. Stool softeners: these are not laxatives, but serve to soften the stool to avoid straining.  Take 2-4 times a day until normal bowel function returns         Examples: Colace or generic equivalent (Docusate) 5. Bulk laxatives: provide  a concentrated source of fiber.  They do not stimulate the bowel.  Take 1-2 times each day until normal bowel function return.              Examples: Citrucel, Metamucil, Fiberal, Fibercon   What can I take for "Gas Pains"? 1. Simethicone (Mylicon, Gas-X, Maalox-Gas, Mylanta-Gas) take 3-4 times a day 2. Maalox Regular - take 3-4 times a day 3. Mylanta Regular - take 3-4 times a day   What can I take if I become constipated? 1. Start with stool softeners and add additional laxatives below as needed to have a bowel movement every 1-2 days  2. Stool softeners 1-2 tablets, 2 times a day 3. Senokot 1-2 tablets, 1-2 times a day 4. Glycerin suppository can soften hard stool take once a day 5. Bisacodyl suppository once a day  6. Milk of Magnesia 30 mL 1-2 times a day 7. Fleets or tap water enema    What can I do for nausea?  1. Limit most solid foods for 24-48 hours 2. Continue eating small frequent amounts of liquids and/or bland soft foods ? Toast, crackers, cooked cereal (grits, cream of wheat, rice) 3. Benadryl: a mild anti-nausea medicine can be obtained without a prescription. May cause drowsiness, especially if taken with narcotic pain medicines 4. Contact provider for prescription nausea medication     What can I do, or take for diarrhea (more than five loose stools per day)? 1. Drink plenty of clear fluids to prevent dehydration 2. May take Kaopectate, Pepto-Bismol, Imodium, or probiotics for 1-2 days 3. Anusol or Preparation-H can be helpful for hemorrhoids and irritated tissue around anus   When should I call the doctor?             CONSTIPATION:   Not relieved after three days following the above program VOMITING:  That contains  blood, "coffee ground" material  More the three times/hour and unable to keep down nausea medication for more than eight hours  With dry mouth, dark or strong urine, feeling light-headed, dizzy, or confused  With severe abdominal pain or bloating for more than 24 hours DIARRHEA:  That continues for more then 24-48 hours despite treatment  That contains blood or tarry material  With dry mouth, dark or strong urine, feeling light~headed, dizzy, or confused FEVER:  101 F or higher along with nausea, vomiting, gas pain, diarrhea UNABLE TO:  Pass gas from rectum for more than 24 hours  Tolerate liquids by mouth for more than 24 hours        Laparoscopic Hysterectomy, Care After Refer to this sheet in the next few weeks. These instructions provide you with information on caring for yourself after your procedure. Your health care provider may also give you more specific instructions. Your treatment has been planned according to current medical practices, but problems sometimes occur. Call your health care provider if you have any problems or questions after your procedure. What can I expect after the procedure?  Pain and bruising at the incision sites. You will be given pain medicine to control it.  Menopausal symptoms such as hot flashes, night sweats, and insomnia if your ovaries were removed.  Sore throat from the breathing tube that was inserted during surgery. Follow these instructions at home:  Only take over-the-counter or prescription medicines for pain, discomfort, or fever as directed by your health care provider.  Do not take aspirin. It can cause bleeding.  Do not drive when taking pain medicine.  Follow  your health care provider's advice regarding diet, exercise, lifting, driving, and general activities.  Resume your usual diet as directed and allowed.  Get plenty of rest and sleep.  Do not douche, use tampons, or have sexual intercourse for at least 6 weeks, or  until your health care provider gives you permission.  Change your bandages (dressings) as directed by your health care provider.  Monitor your temperature and notify your health care provider of a fever.  Take showers instead of baths for 2-3 weeks.  Do not drink alcohol until your health care provider gives you permission.  If you develop constipation, you may take a mild laxative with your health care provider's permission. Bran foods may help with constipation problems. Drinking enough fluids to keep your urine clear or pale yellow may help as well.  Try to have someone home with you for 1-2 weeks to help around the house.  Keep all of your follow-up appointments as directed by your health care provider. Contact a health care provider if:  You have swelling, redness, or increasing pain around your incision sites.  You have pus coming from your incision.  You notice a bad smell coming from your incision.  Your incision breaks open.  You feel dizzy or lightheaded.  You have pain or bleeding when you urinate.  You have persistent diarrhea.  You have persistent nausea and vomiting.  You have abnormal vaginal discharge.  You have a rash.  You have any type of abnormal reaction or develop an allergy to your medicine.  You have poor pain control with your prescribed medicine. Get help right away if:  You have chest pain or shortness of breath.  You have severe abdominal pain that is not relieved with pain medicine.  You have pain or swelling in your legs. This information is not intended to replace advice given to you by your health care provider. Make sure you discuss any questions you have with your health care provider. Document Released: 11/19/2012 Document Revised: 07/07/2015 Document Reviewed: 08/19/2012 Elsevier Interactive Patient Education  2017 [employer].

## 2018-10-23 ENCOUNTER — Other Ambulatory Visit: Payer: Self-pay

## 2018-10-23 ENCOUNTER — Inpatient Hospital Stay: Payer: Commercial Managed Care - PPO

## 2018-10-23 ENCOUNTER — Inpatient Hospital Stay (HOSPITAL_BASED_OUTPATIENT_CLINIC_OR_DEPARTMENT_OTHER): Payer: Commercial Managed Care - PPO | Admitting: Oncology

## 2018-10-23 ENCOUNTER — Encounter: Payer: Self-pay | Admitting: Oncology

## 2018-10-23 ENCOUNTER — Ambulatory Visit: Payer: Commercial Managed Care - PPO

## 2018-10-23 VITALS — BP 123/74 | HR 77 | Temp 96.8°F | Resp 18

## 2018-10-23 VITALS — BP 122/76 | HR 91 | Resp 18 | Wt 107.8 lb

## 2018-10-23 DIAGNOSIS — E538 Deficiency of other specified B group vitamins: Secondary | ICD-10-CM

## 2018-10-23 DIAGNOSIS — C801 Malignant (primary) neoplasm, unspecified: Secondary | ICD-10-CM

## 2018-10-23 DIAGNOSIS — D509 Iron deficiency anemia, unspecified: Secondary | ICD-10-CM

## 2018-10-23 DIAGNOSIS — Z5111 Encounter for antineoplastic chemotherapy: Secondary | ICD-10-CM

## 2018-10-23 LAB — CBC WITH DIFFERENTIAL/PLATELET
Abs Immature Granulocytes: 0.06 10*3/uL (ref 0.00–0.07)
Basophils Absolute: 0.1 10*3/uL (ref 0.0–0.1)
Basophils Relative: 1 %
Eosinophils Absolute: 0 10*3/uL (ref 0.0–0.5)
Eosinophils Relative: 0 %
HCT: 28.6 % — ABNORMAL LOW (ref 36.0–46.0)
Hemoglobin: 9.4 g/dL — ABNORMAL LOW (ref 12.0–15.0)
Immature Granulocytes: 1 %
Lymphocytes Relative: 9 %
Lymphs Abs: 0.8 10*3/uL (ref 0.7–4.0)
MCH: 35.6 pg — ABNORMAL HIGH (ref 26.0–34.0)
MCHC: 32.9 g/dL (ref 30.0–36.0)
MCV: 108.3 fL — ABNORMAL HIGH (ref 80.0–100.0)
Monocytes Absolute: 0.8 10*3/uL (ref 0.1–1.0)
Monocytes Relative: 8 %
Neutro Abs: 7.8 10*3/uL — ABNORMAL HIGH (ref 1.7–7.7)
Neutrophils Relative %: 81 %
Platelets: 139 10*3/uL — ABNORMAL LOW (ref 150–400)
RBC: 2.64 MIL/uL — ABNORMAL LOW (ref 3.87–5.11)
RDW: 19.7 % — ABNORMAL HIGH (ref 11.5–15.5)
WBC: 9.5 10*3/uL (ref 4.0–10.5)
nRBC: 0 % (ref 0.0–0.2)

## 2018-10-23 LAB — COMPREHENSIVE METABOLIC PANEL
ALT: 29 U/L (ref 0–44)
AST: 31 U/L (ref 15–41)
Albumin: 4 g/dL (ref 3.5–5.0)
Alkaline Phosphatase: 95 U/L (ref 38–126)
Anion gap: 10 (ref 5–15)
BUN: 23 mg/dL (ref 8–23)
CO2: 27 mmol/L (ref 22–32)
Calcium: 9.7 mg/dL (ref 8.9–10.3)
Chloride: 101 mmol/L (ref 98–111)
Creatinine, Ser: 0.6 mg/dL (ref 0.44–1.00)
GFR calc Af Amer: >60 mL/min (ref >60)
GFR calc non Af Amer: >60 mL/min (ref >60)
Glucose, Bld: 198 mg/dL — ABNORMAL HIGH (ref 70–99)
Potassium: 4.2 mmol/L (ref 3.5–5.1)
Sodium: 138 mmol/L (ref 135–145)
Total Bilirubin: 0.4 mg/dL (ref 0.3–1.2)
Total Protein: 7.7 g/dL (ref 6.5–8.1)

## 2018-10-23 LAB — PROTEIN, URINE, RANDOM: Total Protein, Urine: 37 mg/dL

## 2018-10-23 MED ORDER — IRON SUCROSE 20 MG/ML IV SOLN
200.0000 mg | Freq: Once | INTRAVENOUS | Status: AC
  Administered 2018-10-23: 10:00:00 200 mg via INTRAVENOUS
  Filled 2018-10-23: qty 10

## 2018-10-23 MED ORDER — PEGFILGRASTIM 6 MG/0.6ML ~~LOC~~ PSKT
6.0000 mg | PREFILLED_SYRINGE | Freq: Once | SUBCUTANEOUS | Status: AC
  Administered 2018-10-23: 6 mg via SUBCUTANEOUS
  Filled 2018-10-23: qty 0.6

## 2018-10-23 MED ORDER — SODIUM CHLORIDE 0.9 % IV SOLN
175.0000 mg/m2 | Freq: Once | INTRAVENOUS | Status: AC
  Administered 2018-10-23: 12:00:00 270 mg via INTRAVENOUS
  Filled 2018-10-23: qty 45

## 2018-10-23 MED ORDER — DIPHENHYDRAMINE HCL 50 MG/ML IJ SOLN
50.0000 mg | Freq: Once | INTRAMUSCULAR | Status: AC
  Administered 2018-10-23: 50 mg via INTRAVENOUS
  Filled 2018-10-23: qty 1

## 2018-10-23 MED ORDER — CYANOCOBALAMIN 1000 MCG/ML IJ SOLN
1000.0000 ug | INTRAMUSCULAR | Status: DC
  Administered 2018-10-23: 1000 ug via INTRAMUSCULAR
  Filled 2018-10-23: qty 1

## 2018-10-23 MED ORDER — SODIUM CHLORIDE 0.9 % IV SOLN: 200.0000 mg | Freq: Once | INTRAVENOUS | Status: DC

## 2018-10-23 MED ORDER — SODIUM CHLORIDE 0.9 % IV SOLN
429.5000 mg | Freq: Once | INTRAVENOUS | Status: AC
  Administered 2018-10-23: 430 mg via INTRAVENOUS
  Filled 2018-10-23: qty 43

## 2018-10-23 MED ORDER — FAMOTIDINE IN NACL 20-0.9 MG/50ML-% IV SOLN
20.0000 mg | Freq: Once | INTRAVENOUS | Status: AC
  Administered 2018-10-23: 20 mg via INTRAVENOUS
  Filled 2018-10-23: qty 50

## 2018-10-23 MED ORDER — PALONOSETRON HCL INJECTION 0.25 MG/5ML
0.2500 mg | Freq: Once | INTRAVENOUS | Status: AC
  Administered 2018-10-23: 0.25 mg via INTRAVENOUS
  Filled 2018-10-23: qty 5

## 2018-10-23 MED ORDER — SODIUM CHLORIDE 0.9 % IV SOLN
Freq: Once | INTRAVENOUS | Status: AC
  Administered 2018-10-23: 11:00:00 via INTRAVENOUS
  Filled 2018-10-23: qty 5

## 2018-10-23 MED ORDER — SODIUM CHLORIDE 0.9 % IV SOLN
Freq: Once | INTRAVENOUS | Status: AC
  Administered 2018-10-23: 09:00:00 via INTRAVENOUS
  Filled 2018-10-23: qty 250

## 2018-10-23 NOTE — Progress Notes (Signed)
Nutrition Follow-up:  Patient with adenocarcinoma of bilateral ovaries.  Patient is receiving chemotherapy with plans for surgery on 10/14.    Met with patient during infusion.  Patient reports that appetite is good.  Reports that she is drinking 2 boost glucose control shakes per day and continues to eat mainly vegetables, grillers (soy burgers), peanut butter, low calorie yogurt.  Likes peanut butter and sugar free jelly.  Patient states that sometimes she does not eat because she is afraid that her blood glucose will go up.      Medications: decadron, lasix, glipizide, metformin, MVI  Labs: glucose 198  Anthropometrics:   Weight is 107 lb 12.8 oz today decreased from 111 lb 6.4 oz on 8/20   NUTRITION DIAGNOSIS:  Inadequate oral intake continues   INTERVENTION:  Patient not eating for fear of increasing blood glucose.  Discussed factors that can increase blood glucose besides eating.   Reviewed ways to increase calories and protein without eating foods that will raise blood glucose.   Asked if patient would be willing to increase drinking boost glucose control from 2 to 3 and she responded no because she did not want her blood glucose to increase.  Discussed the importance of increasing calories and protein due to continued weight loss and prep for surgery.   Provided samples of boost glucose control and glucerna today for patient.      MONITORING, EVALUATION, GOAL: Patient will consume adequate calories and protein to prevent weight loss   NEXT VISIT: Sept 24 th during infusion  Marx Doig B. Zenia Resides, [place], [place] Registered Dietitian [phone] (pager)

## 2018-10-23 NOTE — Progress Notes (Signed)
Patient denies any concerns today.  

## 2018-10-23 NOTE — Progress Notes (Signed)
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 She presented with symptoms of bilateral lower extremity swelling as well as abdominal distention.GI. This was followed by a CT abdomen and chest imaging with contrast. CT showed cystic lesions of the enlarged bilateral ovaries the largest lesion on the left measuring 6.2 cm. Patient also noted to have numerous bulky retroperitoneal lymph nodes, largest left periaortic node or conglomerates near the superior mesenteric artery measuring at least 4.4 x 2.9 cm. Significant ascites was present. Patient underwent paracentesis and cytology from the fluid showed high-grade serous carcinoma. Tumor cells were positive for CK7, PAX 8, p53 and WT 1.  Patient seen by GYN onc and neoadjuvant chemotherapy recommended due to buly retroperitoneal adenopathy  Cycle 1 of carbotaxol Avastin neoadjuvant chemotherapy started on 08/21/2018  Interval history-patient is tolerating chemotherapy well and denies any nausea or vomiting at this time.  Denies any tingling numbness in her hands and feet.  ECOG PS- 1 Pain scale- 0  Review of systems- Review of Systems  Constitutional: Positive for malaise/fatigue. Negative for chills, fever and weight loss.  HENT: Negative for congestion, ear discharge and nosebleeds.   Eyes: Negative for blurred vision.  Respiratory:  Negative for cough, hemoptysis, sputum production, shortness of breath and wheezing.   Cardiovascular: Negative for chest pain, palpitations, orthopnea and claudication.  Gastrointestinal: Negative for abdominal pain, blood in stool, constipation, diarrhea, heartburn, melena, nausea and vomiting.  Genitourinary: Negative for dysuria, flank pain, frequency, hematuria and urgency.  Musculoskeletal: Negative for back pain, joint pain and myalgias.  Skin: Negative for rash.  Neurological: Negative for dizziness, tingling, focal weakness, seizures, weakness and headaches.  Endo/Heme/Allergies: Does not bruise/bleed easily.  Psychiatric/Behavioral: Negative for depression and suicidal ideas. The patient does not have insomnia.        Allergies  Allergen Reactions   Clarithromycin Nausea Only   Doxycycline     nausea, dizziness.   Penicillins Hives     Past Medical History:  Diagnosis Date   Allergy    Diabetes mellitus without complication ([place])    Diverticulosis    Family history of lung cancer    Hyperlipidemia    Hypothyroidism    Ovarian cancer ([place]) 08/04/2018   Chemo tx's.   Thyroid disease      Past Surgical History:  Procedure Laterality Date   BACK SURGERY  2007   Lumbar Surgery   BREAST CYST ASPIRATION Right    Around 10 years ago. Pt thinks it was the right breast but not sure.   CATARACT EXTRACTION Left 2010   Right Eye 2011   COLONOSCOPY WITH PROPOFOL N/A 09/02/2015   Procedure: COLONOSCOPY WITH PROPOFOL;  Surgeon: [name], MD;  Location: [place];  Service: Endoscopy;  Laterality: N/A;  Diabetic - oral meds   ESOPHAGOGASTRODUODENOSCOPY (EGD) WITH PROPOFOL N/A 11/03/2015   Procedure: ESOPHAGOGASTRODUODENOSCOPY (EGD) WITH PROPOFOL;  Surgeon: [name], MD;  Location: [place];  Service: Endoscopy;  Laterality: N/A;  EYE SURGERY Bilateral 06/2014    tear duck catherization ( Dr. Vallarie Mare)   POLYPECTOMY  09/02/2015    Procedure: POLYPECTOMY;  Surgeon: [name], MD;  Location: [place];  Service: Endoscopy;;   TONSILLECTOMY  1963    Social History   Socioeconomic History   Marital status: Single    Spouse name: Not on file   Number of children: Not on file   Years of education: Not on file   Highest education level: Not on file  Occupational History   Not on file  Social Needs   Financial resource strain: Not on file   Food insecurity    Worry: Not on file    Inability: Not on file   Transportation needs    Medical: Not on file    Non-medical: Not on file  Tobacco Use   Smoking status: Never Smoker   Smokeless tobacco: Never Used  Substance and Sexual Activity   Alcohol use: No   Drug use: No   Sexual activity: Not on file  Lifestyle   Physical activity    Days per week: Not on file    Minutes per session: Not on file   Stress: Not on file  Relationships   Social connections    Talks on phone: Not on file    Gets together: Not on file    Attends religious service: Not on file    Active member of club or organization: Not on file    Attends meetings of clubs or organizations: Not on file    Relationship status: Not on file   Intimate partner violence    Fear of current or ex partner: Not on file    Emotionally abused: Not on file    Physically abused: Not on file    Forced sexual activity: Not on file  Other Topics Concern   Not on file  Social History Narrative   Not on file    Family History  Problem Relation Age of Onset   Congestive Heart Failure Mother    Diabetes Mother    Stroke Mother    Hypothyroidism Father    Cancer Father        lung cancer   Cancer Paternal Grandmother        stomach     Current Outpatient Medications:    aspirin 81 MG tablet, ASPIRIN, '81MG'$  (Oral Tablet Delayed Release)  1 a week for 0 days  Quantity: 0.00;  Refills: 0   Ordered :02-Oct-2010  Doy Hutching ;  Started 12-Mar-2007 Active, Disp:  , Rfl:    dexamethasone (DECADRON) 4 MG tablet, Take 2 tablets (8 mg total) by mouth daily. Start the day after carboplatin chemotherapy for 3 days., Disp: 30 tablet, Rfl: 1   fexofenadine (ALLEGRA) 60 MG tablet, Take 60 mg by mouth daily as needed. Reported on 09/01/2015, Disp: , Rfl:    furosemide (LASIX) 20 MG tablet, Take 1 tablet (20 mg total) by mouth daily. (Patient not taking: Reported on 10/22/2018), Disp: 20 tablet, Rfl: 0   glipiZIDE (GLUCOTROL) 5 MG tablet, TAKE 1 TABLET BY MOUTH DAILY BEFORE BREAKFAST, Disp: 90 tablet, Rfl: 1   glucose blood (ONE TOUCH ULTRA TEST) test strip, USE TO TEST BLOOD SUGAR TWICE A DAY AS INSTRUCTED, Disp: 100 each, Rfl: 12   levothyroxine (SYNTHROID) 100 MCG tablet, Take 1 tablet (100 mcg total) by mouth daily., Disp: 90 tablet, Rfl: 1   lidocaine-prilocaine (EMLA) cream, Apply to affected area once (Patient not taking:  Reported on 10/22/2018), Disp: 30 g, Rfl: 3   metFORMIN (GLUCOPHAGE) 500 MG tablet, Take 2 tablets (1,000 mg total) by mouth 2 (two) times daily., Disp: 360 tablet, Rfl: 0   MULTIPLE VITAMIN PO, MULTIVITAMINS (Oral Tablet)  1 Every Day for 0 days  Quantity: 0.00;  Refills: 0   Ordered :02-Oct-2010  Doy Hutching ;  Started 12-Mar-2007 Active, Disp: , Rfl:    ONETOUCH DELICA LANCETS 93T MISC, USE TO TEST BLOOD SUGAR ONCE A DAY, Disp: 100 each, Rfl: 12   simvastatin (ZOCOR) 20 MG tablet, Take 1 tablet (20 mg total) by mouth at bedtime., Disp: 90 tablet, Rfl: 1   spironolactone (ALDACTONE) 50 MG tablet, Take 1 tablet (50 mg total) by mouth daily. (Patient not taking: Reported on 10/22/2018), Disp: 20 tablet, Rfl: 0   calcipotriene (DOVONOX) 0.005 % ointment, Apply 1 application topically as needed. CALCIPOTRIENE, 0.005% (External Ointment) - Historical Medication  apply to skin daily (0.005 %) Active, Disp: , Rfl:    LORazepam (ATIVAN) 0.5 MG tablet, Take 1 tablet (0.5 mg total) by mouth every 6 (six) hours as needed (Nausea or vomiting).  (Patient not taking: Reported on 10/22/2018), Disp: 30 tablet, Rfl: 0   ondansetron (ZOFRAN) 8 MG tablet, Take 1 tablet (8 mg total) by mouth 2 (two) times daily as needed for refractory nausea / vomiting. Start on day 3 after carboplatin chemo., Disp: 30 tablet, Rfl: 1   prochlorperazine (COMPAZINE) 10 MG tablet, Take 1 tablet (10 mg total) by mouth every 6 (six) hours as needed (Nausea or vomiting). (Patient not taking: Reported on 10/02/2018), Disp: 30 tablet, Rfl: 1 No current facility-administered medications for this visit.   Facility-Administered Medications Ordered in Other Visits:    CARBOplatin (PARAPLATIN) 430 mg in sodium chloride 0.9 % 250 mL chemo infusion, 430 mg, Intravenous, Once, Sindy Guadeloupe, MD   cyanocobalamin ((VITAMIN B-12)) injection 1,000 mcg, 1,000 mcg, Intramuscular, Q30 days, Sindy Guadeloupe, MD, 1,000 mcg at 10/23/18 1001   PACLitaxel (TAXOL) 270 mg in sodium chloride 0.9 % 250 mL chemo infusion (> '80mg'$ /m2), 175 mg/m2 (Treatment Plan Recorded), Intravenous, Once, Sindy Guadeloupe, MD, Last Rate: 98 mL/hr at 10/23/18 1144, 270 mg at 10/23/18 1144   pegfilgrastim (NEULASTA ONPRO KIT) injection 6 mg, 6 mg, Subcutaneous, Once, Sindy Guadeloupe, MD  Physical exam:  Vitals:   10/23/18 0843  BP: 122/76  Pulse: 91  Resp: 18  Weight: 107 lb 12.8 oz (48.9 kg)   Physical Exam Constitutional:      Comments: Patient is thin and cachectic.  Appears in no acute distress  HENT:     Head: Normocephalic and atraumatic.  Eyes:     Pupils: Pupils are equal, round, and reactive to light.  Neck:     Musculoskeletal: Normal range of motion.  Cardiovascular:     Rate and Rhythm: Normal rate and regular rhythm.     Heart sounds: Normal heart sounds.  Pulmonary:     Effort: Pulmonary effort is normal.     Breath sounds: Normal breath sounds.  Abdominal:     General: Bowel sounds are normal. There is distension.     Palpations: Abdomen is soft.  Musculoskeletal:     Comments:  Bilateral +1 edema  Skin:    General: Skin is warm and dry.  Neurological:     Mental Status: She is alert and oriented to person, place, and time.      CMP Latest Ref Rng & Units 10/23/2018  Glucose  70 - 99 mg/dL 198(H)  BUN 8 - 23 mg/dL 23  Creatinine 0.44 - 1.00 mg/dL 0.60  Sodium 135 - 145 mmol/L 138  Potassium 3.5 - 5.1 mmol/L 4.2  Chloride 98 - 111 mmol/L 101  CO2 22 - 32 mmol/L 27  Calcium 8.9 - 10.3 mg/dL 9.7  Total Protein 6.5 - 8.1 g/dL 7.7  Total Bilirubin 0.3 - 1.2 mg/dL 0.4  Alkaline Phos 38 - 126 U/L 95  AST 15 - 41 U/L 31  ALT 0 - 44 U/L 29   CBC Latest Ref Rng & Units 10/23/2018  WBC 4.0 - 10.5 K/uL 9.5  Hemoglobin 12.0 - 15.0 g/dL 9.4(L)  Hematocrit 36.0 - 46.0 % 28.6(L)  Platelets 150 - 400 K/uL 139(L)    No images are attached to the encounter.  Ct Chest W Contrast  Result Date: 10/16/2018 CLINICAL DATA:  63 year old female with history of ovarian cancer. Restaging examination. EXAM: CT CHEST, ABDOMEN, AND PELVIS WITH CONTRAST TECHNIQUE: Multidetector CT imaging of the chest, abdomen and pelvis was performed following the standard protocol during bolus administration of intravenous contrast. CONTRAST:  69m OMNIPAQUE IOHEXOL 300 MG/ML  SOLN COMPARISON:  CT the chest, abdomen and pelvis 08/04/2018. FINDINGS: CT CHEST FINDINGS Cardiovascular: Heart size is normal. There is no significant pericardial fluid, thickening or pericardial calcification. There is aortic atherosclerosis, as well as atherosclerosis of the great vessels of the mediastinum and the coronary arteries, including calcified atherosclerotic plaque in the left anterior descending coronary artery. Mediastinum/Nodes: No pathologically enlarged mediastinal or hilar lymph nodes. Esophagus is unremarkable in appearance. No axillary lymphadenopathy. Lungs/Pleura: Mild linear scarring in the left lower lobe, unchanged. No suspicious appearing pulmonary nodules or masses are noted. No acute consolidative  airspace disease. No pleural effusions. Musculoskeletal: Orthopedic fixation hardware in the midthoracic spine. There are no aggressive appearing lytic or blastic lesions noted in the visualized portions of the skeleton. CT ABDOMEN PELVIS FINDINGS Hepatobiliary: No suspicious cystic or solid hepatic lesions. No intra or extrahepatic biliary ductal dilatation. Gallbladder is normal in appearance. Pancreas: No pancreatic mass. No pancreatic ductal dilatation. No pancreatic or peripancreatic fluid collections or inflammatory changes. Spleen: Unremarkable. Adrenals/Urinary Tract: Bilateral kidneys and adrenal glands are normal in appearance. No hydroureteronephrosis. Urinary bladder is nearly decompressed, but otherwise normal in appearance. Stomach/Bowel: Normal appearance of the stomach. No pathologic dilatation of small bowel or colon. The appendix is not confidently identified and may be surgically absent. Regardless, there are no inflammatory changes noted adjacent to the cecum to suggest the presence of an acute appendicitis at this time. Vascular/Lymphatic: Atherosclerosis throughout the abdominal and pelvic vasculature, without evidence of aneurysm or dissection. Previously noted retroperitoneal lymphadenopathy has regressed. No new lymphadenopathy noted elsewhere in the abdomen or pelvis. Reproductive: Previously noted complex cystic and solid ovarian lesions have regressed compared to the prior examination. Right ovary currently measures approximately 4.6 x 3.2 x 3.6 cm (axial image 113 of series 2 and coronal image 71 of series 4), while the left ovary currently measures 4.9 x 3.7 x 5.6 cm (axial image 118 of series 2 and coronal image 76 of series 4). The right ovary is mixed cystic and solid, but predominantly solid in appearance, while the left ovary is now predominantly cystic in appearance. Uterus is either atrophic or surgically absent. Other: No significant volume of ascites.  No pneumoperitoneum.  Musculoskeletal: There are no aggressive appearing lytic or blastic lesions noted in the visualized portions of the skeleton. IMPRESSION: 1. Today's study demonstrates a marked  positive response to therapy with regression of bilateral ovarian lesions and resolution of previously noted retroperitoneal lymphadenopathy. Additionally, previously noted ascites has resolved. 2. No findings to suggest metastatic disease in the thorax. 3. Aortic atherosclerosis, in addition to left anterior descending coronary artery disease. Please note that although the presence of coronary artery calcium documents the presence of coronary artery disease, the severity of this disease and any potential stenosis cannot be assessed on this non-gated CT examination. Assessment for potential risk factor modification, dietary therapy or pharmacologic therapy may be warranted, if clinically indicated. Electronically Signed   By: Vinnie Langton M.D.   On: 10/16/2018 13:50   Ct Abdomen Pelvis W Contrast  Result Date: 10/16/2018 CLINICAL DATA:  63 year old female with history of ovarian cancer. Restaging examination. EXAM: CT CHEST, ABDOMEN, AND PELVIS WITH CONTRAST TECHNIQUE: Multidetector CT imaging of the chest, abdomen and pelvis was performed following the standard protocol during bolus administration of intravenous contrast. CONTRAST:  36m OMNIPAQUE IOHEXOL 300 MG/ML  SOLN COMPARISON:  CT the chest, abdomen and pelvis 08/04/2018. FINDINGS: CT CHEST FINDINGS Cardiovascular: Heart size is normal. There is no significant pericardial fluid, thickening or pericardial calcification. There is aortic atherosclerosis, as well as atherosclerosis of the great vessels of the mediastinum and the coronary arteries, including calcified atherosclerotic plaque in the left anterior descending coronary artery. Mediastinum/Nodes: No pathologically enlarged mediastinal or hilar lymph nodes. Esophagus is unremarkable in appearance. No axillary  lymphadenopathy. Lungs/Pleura: Mild linear scarring in the left lower lobe, unchanged. No suspicious appearing pulmonary nodules or masses are noted. No acute consolidative airspace disease. No pleural effusions. Musculoskeletal: Orthopedic fixation hardware in the midthoracic spine. There are no aggressive appearing lytic or blastic lesions noted in the visualized portions of the skeleton. CT ABDOMEN PELVIS FINDINGS Hepatobiliary: No suspicious cystic or solid hepatic lesions. No intra or extrahepatic biliary ductal dilatation. Gallbladder is normal in appearance. Pancreas: No pancreatic mass. No pancreatic ductal dilatation. No pancreatic or peripancreatic fluid collections or inflammatory changes. Spleen: Unremarkable. Adrenals/Urinary Tract: Bilateral kidneys and adrenal glands are normal in appearance. No hydroureteronephrosis. Urinary bladder is nearly decompressed, but otherwise normal in appearance. Stomach/Bowel: Normal appearance of the stomach. No pathologic dilatation of small bowel or colon. The appendix is not confidently identified and may be surgically absent. Regardless, there are no inflammatory changes noted adjacent to the cecum to suggest the presence of an acute appendicitis at this time. Vascular/Lymphatic: Atherosclerosis throughout the abdominal and pelvic vasculature, without evidence of aneurysm or dissection. Previously noted retroperitoneal lymphadenopathy has regressed. No new lymphadenopathy noted elsewhere in the abdomen or pelvis. Reproductive: Previously noted complex cystic and solid ovarian lesions have regressed compared to the prior examination. Right ovary currently measures approximately 4.6 x 3.2 x 3.6 cm (axial image 113 of series 2 and coronal image 71 of series 4), while the left ovary currently measures 4.9 x 3.7 x 5.6 cm (axial image 118 of series 2 and coronal image 76 of series 4). The right ovary is mixed cystic and solid, but predominantly solid in appearance, while  the left ovary is now predominantly cystic in appearance. Uterus is either atrophic or surgically absent. Other: No significant volume of ascites.  No pneumoperitoneum. Musculoskeletal: There are no aggressive appearing lytic or blastic lesions noted in the visualized portions of the skeleton. IMPRESSION: 1. Today's study demonstrates a marked positive response to therapy with regression of bilateral ovarian lesions and resolution of previously noted retroperitoneal lymphadenopathy. Additionally, previously noted ascites has resolved. 2. No findings to  suggest metastatic disease in the thorax. 3. Aortic atherosclerosis, in addition to left anterior descending coronary artery disease. Please note that although the presence of coronary artery calcium documents the presence of coronary artery disease, the severity of this disease and any potential stenosis cannot be assessed on this non-gated CT examination. Assessment for potential risk factor modification, dietary therapy or pharmacologic therapy may be warranted, if clinically indicated. Electronically Signed   By: Vinnie Langton M.D.   On: 10/16/2018 13:50     Assessment and plan- Patient is a 63 y.o. female  with serous adenocarcinoma bilateral ovaries at least stage III cT3 N2 M0.    She is here for on treatment assessment prior to cycle 4 of carbotaxol chemotherapy  Initially plan was to give him 3 cycles of neoadjuvant carbotaxol Avastin chemotherapy followed by surgery.  However I spoke with Dr. Theora Gianotti yesterday and she would like to operate on the patient within 4 weeks of the last chemotherapy.  Since she received a Avastin 3 weeks ago I will proceed with fourth cycle today without  Avastin and her surgery would be tentatively scheduled for 26 November 2018.  Patient will also receive on pro-Neulasta support today.  I did review CT chest abdomen and pelvis images independently and discussed findings with the patient which shows overall excellent  response to treatment so far.Marked decrease in the bilateral ovarian lesions as well as retroperitoneal adenopathy.  Ascites had also resolved.  No distant metastases.  Overall patient is tolerating chemotherapy well without any significant side effects.  Macrocytic anemia: Patient was found to have evidence of B12 deficiency in the past.  She will proceed with B12 shot today and receive 4 more B12 shots with IV iron.  See below  Low ferritin: Patient was noted to have a low ferritin of 22 in June 2020.  In anticipation of upcoming surgery I will proceed with 5 doses of Venofer 200 mg IV over the next 2 weeks.  Discussed risks and benefits of benefit including all but not limited to headache, leg swelling and possible risk of infusion reaction.  Patient understands and agrees to proceed as planned  We will obtain germline as well as somatic testing on her biopsy specimen at this time.  She was seen by genetic counseling but the plan at that time was to wait for germline testing to be completed after surgery  I will see her back on 11/22/2018 with CBC with differential, ferritin and iron studies as well as B12.    Visit Diagnosis 1. Encounter for antineoplastic chemotherapy   2. Serous adenocarcinoma ([place])      Dr. Randa Evens, MD, MPH [facility] [phone] 10/23/2018 1:39 PM

## 2018-10-24 LAB — CA 125: Cancer Antigen (CA) 125: 31.2 U/mL (ref 0.0–38.1)

## 2018-10-28 ENCOUNTER — Other Ambulatory Visit: Payer: Self-pay

## 2018-10-29 ENCOUNTER — Inpatient Hospital Stay: Payer: Commercial Managed Care - PPO

## 2018-10-29 ENCOUNTER — Other Ambulatory Visit: Payer: Self-pay

## 2018-10-29 VITALS — BP 116/76 | HR 88 | Temp 96.0°F | Resp 18

## 2018-10-29 DIAGNOSIS — Z5111 Encounter for antineoplastic chemotherapy: Secondary | ICD-10-CM | POA: Diagnosis not present

## 2018-10-29 DIAGNOSIS — E538 Deficiency of other specified B group vitamins: Secondary | ICD-10-CM

## 2018-10-29 MED ORDER — SODIUM CHLORIDE 0.9 % IV SOLN
Freq: Once | INTRAVENOUS | Status: AC
  Administered 2018-10-29: 12:00:00 via INTRAVENOUS
  Filled 2018-10-29: qty 250

## 2018-10-29 MED ORDER — SODIUM CHLORIDE 0.9 % IV SOLN: 200.0000 mg | Freq: Once | INTRAVENOUS | Status: DC

## 2018-10-29 MED ORDER — IRON SUCROSE 20 MG/ML IV SOLN
200.0000 mg | Freq: Once | INTRAVENOUS | Status: AC
  Administered 2018-10-29: 200 mg via INTRAVENOUS
  Filled 2018-10-29: qty 10

## 2018-10-29 NOTE — Progress Notes (Signed)
It was noted at her last gyn onc appointment that germline /somatic testing had not been completed. [facility] germline and [facility] somatic testing request sent at the request of Dr. Janese Banks. Blood drawn 10/29/18 and sent to [facility]. Meredith Jones has already been seen by genetic counseling. Message sent to Meredith Jones to notify that requests were sent and to request formal counseling for results.

## 2018-10-29 NOTE — Progress Notes (Signed)
Pt tolerated infusion well. Pt and VS stable at discharge.  

## 2018-10-31 ENCOUNTER — Inpatient Hospital Stay: Payer: Commercial Managed Care - PPO

## 2018-10-31 ENCOUNTER — Other Ambulatory Visit: Payer: Self-pay

## 2018-10-31 VITALS — BP 146/87 | HR 114 | Temp 98.2°F | Resp 18

## 2018-10-31 DIAGNOSIS — E538 Deficiency of other specified B group vitamins: Secondary | ICD-10-CM

## 2018-10-31 DIAGNOSIS — Z5111 Encounter for antineoplastic chemotherapy: Secondary | ICD-10-CM | POA: Diagnosis not present

## 2018-10-31 MED ORDER — SODIUM CHLORIDE 0.9 % IV SOLN
Freq: Once | INTRAVENOUS | Status: AC
  Administered 2018-10-31: 13:00:00 via INTRAVENOUS
  Filled 2018-10-31: qty 250

## 2018-10-31 MED ORDER — CYANOCOBALAMIN 1000 MCG/ML IJ SOLN
1000.0000 ug | INTRAMUSCULAR | Status: DC
  Administered 2018-10-31: 14:00:00 1000 ug via INTRAMUSCULAR
  Filled 2018-10-31: qty 1

## 2018-10-31 MED ORDER — SODIUM CHLORIDE 0.9 % IV SOLN: 200.0000 mg | Freq: Once | INTRAVENOUS | Status: DC

## 2018-10-31 MED ORDER — IRON SUCROSE 20 MG/ML IV SOLN
200.0000 mg | Freq: Once | INTRAVENOUS | Status: AC
  Administered 2018-10-31: 200 mg via INTRAVENOUS
  Filled 2018-10-31: qty 10

## 2018-11-03 ENCOUNTER — Other Ambulatory Visit: Payer: Self-pay

## 2018-11-04 ENCOUNTER — Inpatient Hospital Stay: Payer: Commercial Managed Care - PPO

## 2018-11-04 ENCOUNTER — Other Ambulatory Visit: Payer: Self-pay

## 2018-11-04 ENCOUNTER — Other Ambulatory Visit: Payer: Self-pay | Admitting: *Deleted

## 2018-11-04 VITALS — BP 123/73 | HR 81 | Temp 97.3°F | Resp 20

## 2018-11-04 DIAGNOSIS — Z23 Encounter for immunization: Secondary | ICD-10-CM

## 2018-11-04 DIAGNOSIS — C801 Malignant (primary) neoplasm, unspecified: Secondary | ICD-10-CM

## 2018-11-04 DIAGNOSIS — E538 Deficiency of other specified B group vitamins: Secondary | ICD-10-CM

## 2018-11-04 DIAGNOSIS — Z5111 Encounter for antineoplastic chemotherapy: Secondary | ICD-10-CM | POA: Diagnosis not present

## 2018-11-04 MED ORDER — CYANOCOBALAMIN 1000 MCG/ML IJ SOLN
1000.0000 ug | Freq: Once | INTRAMUSCULAR | Status: AC
  Administered 2018-11-04: 1000 ug via INTRAMUSCULAR
  Filled 2018-11-04: qty 1

## 2018-11-04 MED ORDER — SODIUM CHLORIDE 0.9 % IV SOLN: 200.0000 mg | Freq: Once | INTRAVENOUS | Status: DC

## 2018-11-04 MED ORDER — IRON SUCROSE 20 MG/ML IV SOLN
200.0000 mg | Freq: Once | INTRAVENOUS | Status: AC
  Administered 2018-11-04: 200 mg via INTRAVENOUS
  Filled 2018-11-04: qty 10

## 2018-11-04 MED ORDER — SODIUM CHLORIDE 0.9 % IV SOLN
Freq: Once | INTRAVENOUS | Status: AC
  Administered 2018-11-04: 14:00:00 via INTRAVENOUS
  Filled 2018-11-04: qty 250

## 2018-11-04 NOTE — Progress Notes (Signed)
Per MD, Dr. Janese Banks, order: administer Vitamin B12 injection today. Patient receiving Venofer infusion today as well.

## 2018-11-06 ENCOUNTER — Inpatient Hospital Stay: Payer: Commercial Managed Care - PPO

## 2018-11-06 ENCOUNTER — Other Ambulatory Visit: Payer: Self-pay

## 2018-11-06 VITALS — BP 138/74 | HR 85 | Temp 98.1°F | Resp 18

## 2018-11-06 DIAGNOSIS — E538 Deficiency of other specified B group vitamins: Secondary | ICD-10-CM

## 2018-11-06 DIAGNOSIS — Z5111 Encounter for antineoplastic chemotherapy: Secondary | ICD-10-CM | POA: Diagnosis not present

## 2018-11-06 DIAGNOSIS — Z23 Encounter for immunization: Secondary | ICD-10-CM

## 2018-11-06 MED ORDER — SODIUM CHLORIDE 0.9 % IV SOLN
Freq: Once | INTRAVENOUS | Status: AC
  Administered 2018-11-06: 14:00:00 via INTRAVENOUS
  Filled 2018-11-06: qty 250

## 2018-11-06 MED ORDER — CYANOCOBALAMIN 1000 MCG/ML IJ SOLN
1000.0000 ug | INTRAMUSCULAR | Status: DC
  Administered 2018-11-06: 1000 ug via INTRAMUSCULAR
  Filled 2018-11-06: qty 1

## 2018-11-06 MED ORDER — INFLUENZA VAC SPLIT QUAD 0.5 ML IM SUSY
0.5000 mL | PREFILLED_SYRINGE | Freq: Once | INTRAMUSCULAR | Status: AC
  Administered 2018-11-06: 0.5 mL via INTRAMUSCULAR
  Filled 2018-11-06: qty 0.5

## 2018-11-06 MED ORDER — IRON SUCROSE 20 MG/ML IV SOLN
200.0000 mg | Freq: Once | INTRAVENOUS | Status: AC
  Administered 2018-11-06: 200 mg via INTRAVENOUS
  Filled 2018-11-06: qty 10

## 2018-11-06 MED ORDER — SODIUM CHLORIDE 0.9 % IV SOLN: 200.0000 mg | Freq: Once | INTRAVENOUS | Status: DC

## 2018-11-06 NOTE — Progress Notes (Signed)
Nutrition Follow-up:  Patient with adenocarcinoma of bilateral ovaries.    Met with patient during infusion of venofer.  Patient reports that her continues to have a great appetite. Continues to eat mostly plant foods (veggie grillers, vegetables, peanut butter, nuts, cheese).  Only drinks 1 boost glucose control shake now as fearful of blood glucose going up.  Also verbalizes that chemotherapy makes blood glucose go up as well.     Medications: reviewed  Labs: reviewed  Anthropometrics:   No new weight.  Last weight 107 lb on 9/10   NUTRITION DIAGNOSIS: Inadequate oral intake continues   INTERVENTION:  Stressed importance of eating solid foods (snacks) if not drinking shakes to provide additional calories and protein. Reviewed foods that contain increased calories and protein that will not increase blood glucose.  Stressed importance of adequate nutrition prior to surgery on 10/14 Patient has contact information    MONITORING, EVALUATION, GOAL: patient will consume adequate calories and protein to prevent weight loss   NEXT VISIT: phone f/u October 29  Sandrine Bloodsworth B. Zenia Resides, [place], [place] Registered Dietitian [phone] (pager)

## 2018-11-11 ENCOUNTER — Encounter: Payer: Self-pay | Admitting: Oncology

## 2018-11-11 NOTE — Progress Notes (Signed)
Patient is coming in for follow up no complaints

## 2018-11-12 ENCOUNTER — Inpatient Hospital Stay (HOSPITAL_BASED_OUTPATIENT_CLINIC_OR_DEPARTMENT_OTHER): Payer: Commercial Managed Care - PPO | Admitting: Obstetrics and Gynecology

## 2018-11-12 ENCOUNTER — Other Ambulatory Visit: Payer: Self-pay

## 2018-11-12 ENCOUNTER — Inpatient Hospital Stay: Payer: Commercial Managed Care - PPO

## 2018-11-12 VITALS — BP 118/77 | HR 93 | Temp 98.1°F | Wt 107.0 lb

## 2018-11-12 DIAGNOSIS — D6481 Anemia due to antineoplastic chemotherapy: Secondary | ICD-10-CM

## 2018-11-12 DIAGNOSIS — Z5111 Encounter for antineoplastic chemotherapy: Secondary | ICD-10-CM | POA: Diagnosis not present

## 2018-11-12 DIAGNOSIS — C801 Malignant (primary) neoplasm, unspecified: Secondary | ICD-10-CM

## 2018-11-12 DIAGNOSIS — T451X5A Adverse effect of antineoplastic and immunosuppressive drugs, initial encounter: Secondary | ICD-10-CM

## 2018-11-12 LAB — T4, FREE: Free T4: 1.33 ng/dL — ABNORMAL HIGH (ref 0.61–1.12)

## 2018-11-12 LAB — TSH: TSH: 1.899 u[IU]/mL (ref 0.350–4.500)

## 2018-11-12 LAB — PROTIME-INR
INR: 1 (ref 0.8–1.2)
Prothrombin Time: 13.1 seconds (ref 11.4–15.2)

## 2018-11-12 LAB — APTT: aPTT: 29 seconds (ref 24–36)

## 2018-11-12 NOTE — Progress Notes (Signed)
Patient seen by Dr Theora Gianotti today.  Chart was updated while in clinic.

## 2018-11-12 NOTE — Progress Notes (Signed)
Reviewed surgery teaching and prep. Ms Pon[place]on has all appointments for PAT and COVID-19 test. Educated on process for these and provided in print. She was instructed to take her last dose of aspirin 10/3 for 10 day hold prior to surgery. This was also provided in print. Encouraged to call for any questions. Labs added for today.

## 2018-11-12 NOTE — H&P (View-Only) (Signed)
Gynecologic Oncology Interval Visit   Referring Provider: Dr. Janese Banks  Chief Complaint: high grade serous adenocarcinoma  Subjective:  Meredith Jones is a 63 y.o. [place] female, diagnosed with high grade serous adenocarcinoma, s/p 4 cycles of carbo-taxol-bevacizumab (bevacizumab held for cycle 4) who returns to clinic today for consideration of interval debulking surgery.  She received 3 cycles of carbo-taxol-bevacizumab 08/21/2018-10/02/2018.  She received cycle 4 of carbo-Taxol on 10/23/2018.  She received 5 doses of Venofer (10/23/2018-11/06/2018).  She received 4 doses of vitamin B12 (10/23/2018-11/06/2018).  Myriad myrisk hereditary cancer and BRCAnalysis genetic testing on 10/29/2018 revealed no clinically significant mutations identified. Negative for BRCA1 and BRCA 2.  Breast cancer risk score 5.5% lifetime risk.   CA 125 08/08/2018 491.0 09/11/2018 200 10/02/2018 78.5 10/23/2018 31.2  She suffered anemia during chemotherapy.  Last hemoglobin was 9.4 on 10/23/2018.  Since that time she has received vitamin B12 and iron infusions.    Gynecologic Oncology History:  Meredith Jones is a pleasant [place] female, diagnosed with high grade serous adenocarcinoma.   She presented to ER on 07/23/2018 with symptoms of bilateral lower extremity swelling as well as abdominal distention. US abdomen revealed large volume ascites, no overt changes of hepatic cirrhosis and was referred to Dr. Tama Headings. She underwent US paracentesis on 07/29/2018. 5L of fluid was removed.   Pathology:  A. ABDOMINAL FLUID; PARACENTESIS:  - MALIGNANT CELLS PRESENT.  - ADENOCARCINOMA, MOST CONSISTENT WITH HIGH-GRADE SEROUS CARCINOMA.   08/04/2018- CT C/A/P IMPRESSION: 1. There are cystic lesions of the enlarged bilateral ovaries, largest lesion on the left measuring at least 6.2 cm (series 2, image 116, series 6, image 76). Findings are highly concerning for ovarian neoplasm, particularly given presence of otherwise unexplained  ascites and abdominal lymphadenopathy.  2. There are numerous bulky retroperitoneal lymph nodes, largest left periaortic nodes or conglomerates near the superior mesenteric artery measuring at least 4.4 x 2.9 cm (series 2, image 64), concerning for metastatic disease, or alternately primary malignancy such as lymphoma.  Nonobstructive inferior pole left nephrolithiasis. Fusion of T7-T10.   She works as a Scientist, water quality.  She lives with her boyfriend.  She reports about 35 pound weight loss over the last 4 to 5 months but more significant weight loss of past several years.  Reports that her appetite is good and she is eating well but continues to lose weight.  Denies any pain at this time.  Reports ongoing fatigue. She saw Dr. Laurey Morale in 2013 for possible endometrial hyperplasia and underwent D/C. Very narrow vagina making visualization of cervix 'almost impossible'.   She saw Dr. Janese Banks on 08/08/2018. Noted to have macrocytic anemia and found to have low B12 levels. TSH- 46.46; FT3 1.1, FT4 0.74. She has seen her endocrinologist and adjusted her levothyroxine. History of type 2 diabetes. Last hA1c was 6.5 (08/04/2018).   CA 125 08/08/2018 491  08/08/2018- albumin 3.1, hemoglobin 10.8, hct 33.8.   We discussed neoadjuvant chemotherapy vs primary debulking surgery.   CT C/A/P- 10/16/2018-  1.Demonstrates a marked positive response to therapy with regression of bilateral ovarian lesions and resolution of previously noted retroperitoneal lymphadenopathy. Additionally, previously noted ascites has resolved. 2. No findings to suggest metastatic disease in the thorax. 3. Aortic atherosclerosis, in addition to left anterior descending coronary artery disease. Please note that although the presence of coronary artery calcium documents the presence of coronary artery disease, the severity of this disease and any potential stenosis cannot be assessed on this non-gated CT examination. Assessment for potential  risk factor  modification, dietary therapy or pharmacologic therapy may be warranted, if clinically indicated.  Genetics Testing:MyRisk testing negative for APC, ATM, AXIN2, BARD1, BMPR1A, BRIP1, BRCA1, BRCA2, CDH1, CDK4, CDKN2A, CHEK2, EPCAM (large rearramgement only), HOXB13 (sequencing only), GALNT12, MLH1, MSH2, MSH3 (excluding repetitive portions of exon1) MSH6, MUTYH, NBN, NTHL1, PALB2, PMS2, PTEN, RAD51C, RAD51D, RNF43, SMAD4, STK11, TP53. Sequencing for select regions of POLE and POLD1, and large rearrangement analysis for select regions of GREM1 were negative.  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   Thyroid disease     Past Surgical History: Past Surgical History:  Procedure Laterality Date   BACK SURGERY  2007   Lumbar Surgery   BREAST CYST ASPIRATION Right    Around 10 years ago. Pt thinks it was the right breast but not sure.   CATARACT EXTRACTION Left 2010   Right Eye 2011   COLONOSCOPY WITH PROPOFOL N/A  09/02/2015   Procedure: COLONOSCOPY WITH PROPOFOL;  Surgeon: [name], MD;  Location: [facility];  Service: Endoscopy;  Laterality: N/A;  Diabetic - oral meds   ESOPHAGOGASTRODUODENOSCOPY (EGD) WITH PROPOFOL N/A 11/03/2015   Procedure: ESOPHAGOGASTRODUODENOSCOPY (EGD) WITH PROPOFOL;  Surgeon: [name], MD;  Location: [facility];  Service: Endoscopy;  Laterality: N/A;   EYE SURGERY Bilateral 06/2014    tear duck catherization ( Dr. Vallarie Mare)   POLYPECTOMY  09/02/2015   Procedure: POLYPECTOMY;  Surgeon: [name], MD;  Location: [facility];  Service: Endoscopy;;   [id number]  [id number]    Past Gynecologic History:  As per HPI  OB History: P0 OB History  No obstetric history on file.    Family History: Family History  Problem Relation Age of Onset   Congestive Heart Failure Mother    Diabetes Mother    Stroke Mother    Hypothyroidism Father    Cancer Father        lung cancer   Cancer Paternal Grandmother        stomach    Social History: Social History   Socioeconomic History   Marital status: Single    Spouse name: Not on file   Number of children: Not on file   Years of education: Not on file   Highest education level: Not on file  Occupational History   Not on file  Social Needs   Financial resource strain: Not on file   Food insecurity    Worry: Not on file    Inability: Not on file   Transportation needs  Medical: Not on file    Non-medical: Not on file  Tobacco Use   Smoking status: Never Smoker   Smokeless tobacco: Never Used  Substance and Sexual Activity   Alcohol use: No   Drug use: No   Sexual activity: Not on file  Lifestyle   Physical activity    Days per week: Not on file    Minutes per session: Not on file   Stress: Not on file  Relationships   Social connections    Talks on phone: Not on file    Gets together: Not on file    Attends religious service: Not on file    Active  member of club or organization: Not on file    Attends meetings of clubs or organizations: Not on file    Relationship status: Not on file   Intimate partner violence    Fear of current or ex partner: Not on file    Emotionally abused: Not on file    Physically abused: Not on file    Forced sexual activity: Not on file  Other Topics Concern   Not on file  Social History Narrative   Not on file    Allergies: Allergies  Allergen Reactions   Clarithromycin Nausea Only   Doxycycline     nausea, dizziness.   Penicillins Hives    Current Medications: Current Outpatient Medications  Medication Sig Dispense Refill   aspirin 81 MG tablet ASPIRIN, 81MG (Oral Tablet Delayed Release)  1 a week for 0 days  Quantity: 0.00;  Refills: 0   Ordered :02-Oct-2010  Doy Hutching ;  Started 12-Mar-2007 Active     calcipotriene (DOVONOX) 0.005 % ointment Apply 1 application topically as needed. CALCIPOTRIENE, 0.005% (External Ointment) - Historical Medication  apply to skin daily (0.005 %) Active     dexamethasone (DECADRON) 4 MG tablet Take 2 tablets (8 mg total) by mouth daily. Start the day after carboplatin chemotherapy for 3 days. 30 tablet 1   fexofenadine (ALLEGRA) 60 MG tablet Take 60 mg by mouth daily as needed. Reported on 09/01/2015     glipiZIDE (GLUCOTROL) 5 MG tablet TAKE 1 TABLET BY MOUTH DAILY BEFORE BREAKFAST 90 tablet 1   glucose blood (ONE TOUCH ULTRA TEST) test strip USE TO TEST BLOOD SUGAR TWICE A DAY AS INSTRUCTED 100 each 12   levothyroxine (SYNTHROID) 100 MCG tablet Take 1 tablet (100 mcg total) by mouth daily. 90 tablet 1   metFORMIN (GLUCOPHAGE) 500 MG tablet Take 2 tablets (1,000 mg total) by mouth 2 (two) times daily. 360 tablet 0   MULTIPLE VITAMIN PO MULTIVITAMINS (Oral Tablet)  1 Every Day for 0 days  Quantity: 0.00;  Refills: 0   Ordered :02-Oct-2010  Doy Hutching ;  Started 12-Mar-2007 Active     ondansetron (ZOFRAN) 8 MG tablet Take 1 tablet (8 mg  total) by mouth 2 (two) times daily as needed for refractory nausea / vomiting. Start on day 3 after carboplatin chemo. 30 tablet 1   ONETOUCH DELICA LANCETS 09F MISC USE TO TEST BLOOD SUGAR ONCE A DAY 100 each 12   prochlorperazine (COMPAZINE) 10 MG tablet Take 1 tablet (10 mg total) by mouth every 6 (six) hours as needed (Nausea or vomiting). 30 tablet 1   simvastatin (ZOCOR) 20 MG tablet Take 1 tablet (20 mg total) by mouth at bedtime. 90 tablet 1   furosemide (LASIX) 20 MG tablet Take 1 tablet (20 mg total) by mouth daily. (Patient not taking: Reported on 10/22/2018)  20 tablet 0   lidocaine-prilocaine (EMLA) cream Apply to affected area once (Patient not taking: Reported on 10/22/2018) 30 g 3   LORazepam (ATIVAN) 0.5 MG tablet Take 1 tablet (0.5 mg total) by mouth every 6 (six) hours as needed (Nausea or vomiting). (Patient not taking: Reported on 10/22/2018) 30 tablet 0   spironolactone (ALDACTONE) 50 MG tablet Take 1 tablet (50 mg total) by mouth daily. (Patient not taking: Reported on 10/22/2018) 20 tablet 0   No current facility-administered medications for this visit.    Review of Systems General: fatigue improving  HEENT: no complaints  Lungs: no complaints  Cardiac: no complaints  GI: diarrhea due to diverticulosis  GU: no complaints  Musculoskeletal: stiffness  Extremities: no complaints  Skin: no complaints  Neuro: no complaints  Endocrine: no complaints  Psych: no complaints       Objective:  Physical Examination:  BP 118/77 (BP Location: Right Arm, Patient Position: Sitting)    Pulse 93    Temp 98.1 F (36.7 C) (Tympanic)    Wt 107 lb (48.5 kg)    BMI 18.95 kg/m     ECOG Performance Status: 1 - Symptomatic but completely ambulatory  GENERAL: Patient is a well appearing female in no acute distress HEENT:  PERRL neck supple with midline trachea. Thyroid without masses.  NODES:  No cervical, supraclavicular, axillary, or inguinal lymphadenopathy palpated.  LUNGS:   Clear to auscultation bilaterally.  No wheezes or rhonchi. HEART:  Regular rate and rhythm. No murmur appreciated. ABDOMEN:  Soft, nontender.  Positive, normoactive bowel sounds.  BACK: Nontender EXTREMITIES:  No peripheral edema.   SKIN:  Ecchymosis o/w clear with no obvious rashes or skin changes. No nail dyscrasia. NEURO:  Nonfocal. Well oriented.  Appropriate affect.  Pelvic: chaperoned by RN EGBUS: no lesions Cervix: no lesions, nontender, mobile Vagina: no lesions, no discharge or bleeding Uterus: normal size, nontender, mobile Adnexa: no palpable masses Rectovaginal: not performed     Lab Review Labs on site today: Lab Results  Component Value Date   WBC 9.5 10/23/2018   HGB 9.4 (L) 10/23/2018   HCT 28.6 (L) 10/23/2018   MCV 108.3 (H) 10/23/2018   PLT 139 (L) 10/23/2018      Chemistry      Component Value Date/Time   NA 138 10/23/2018 0806   NA 142 05/31/2017 0836   K 4.2 10/23/2018 0806   CL 101 10/23/2018 0806   CO2 27 10/23/2018 0806   BUN 23 10/23/2018 0806   BUN 9 05/31/2017 0836   CREATININE 0.60 10/23/2018 0806   CREATININE 0.57 11/13/2016 0933   GLU 89 03/25/2013      Component Value Date/Time   CALCIUM 9.7 10/23/2018 0806   ALKPHOS 95 10/23/2018 0806   AST 31 10/23/2018 0806   ALT 29 10/23/2018 0806   BILITOT 0.4 10/23/2018 0806   BILITOT <0.2 05/31/2017 0836     Albumin 4.0   Lab Results  Component Value Date   TSH 46.461 (H) 08/08/2018  TSH 9.3  10/09/2018  FT3 1.1 elevated 08/08/2018 FT4 WNL 08/08/2018  Radiologic Imaging: As per HPI and interval history  CT C/A/P- 10/16/2018-        Assessment:  ERIYAH FERNANDO is a 63 y.o. 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 Bulky retroperitoneal adenopathy adjacent and surrounding superior mesenteric artery. S/p neoadjuvant chemotherapy with 3 cycles of carboplatin-paclitaxel-bevacizumab following by cycle #4 with  chemotherapy only with excellent  response based on CA125, imaging and exam.   Mild anemia.   Hypoalbuminemia, improved.   Germline testing negative; somatic tumor testing pending.  Thyroid disease (elevated TSH and FT3 with normal FT4), followed by Dr. Gabriel Carina, Endocrinology.   Penicillin allergy  Medical co-morbidities complicating care:Diabetes mellitus without complication ([place]), Hyperlipidemia, Hypothyroid disease. Body mass index is 18.95 kg/m.   Plan:   Problem List Items Addressed This Visit      Other   Serous adenocarcinoma ([place]) - Primary    Other Visit Diagnoses    Antineoplastic chemotherapy induced anemia          We discussed proceeding with interval debulking surgery. She is s/p cycle 4 chemotherapy; no bevacizumab; and doing well.  Anemia approved. Plan for surgery 11/26/2018 with robotic TLH BSO tumor debulking omentectomy possible hand-assisted (would be included under laparotomy for consent), bowel resection, possible ostomy, possible laparotomy. Her consent has already been obtained.   Recheck TFTs/PT/PTT; recommended avoid aspirin for 7-10 days preop.   Risks previously were discussed in detail. These include infection, anesthesia, bleeding, transfusion, wound separation, vaginal cuff dehiscence, medical issues (blood clots, stroke, heart attack, fluid in the lungs, pneumonia, abnormal heart rhythm, death), possible exploratory surgery with larger incision, lymphedema, lymphocyst, allergic reaction, injury to adjacent organs (bowel, bladder, blood vessels, nerves, ureters, uterus). She understands, her questions were addressed, and she agrees with this plan.   Gyn VTE Prophylaxis Algorithm  Risk factors for VTE (calculator):  Point value Risk factors  1 point each acute medical illness  2 points each age 70-74  3 points each none in this category  5 points each  none in this category   Major surgery (>30 min); known malignancy or concern for malignancy  (elevated tumor markers); laparotomy; Very high risk - heparin/UFH and SCDs and extended prophylaxis for 28 days   The patient's diagnosis, an outline of the further diagnostic and laboratory studies which will be required, the recommendation for surgery, and alternatives were discussed with her and her accompanying family members.  All questions were answered to their satisfaction.  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  I personally had a face to face interaction and evaluated the patient jointly with the NP, Ms. Beckey Rutter.  I have reviewed her history and available records and have performed all portions of the physical exam documented by me.  I have discussed the case with the APP and the patient.  I agree with the above documentation, assessment and plan which was fully formulated by me.  Counseling was completed by me.   I personally saw the patient and performed a substantive portion of this encounter in conjunction with the listed APP as documented above.  Viana Sleep Gaetana Michaelis, MD  CC:  Dr. Janese Banks

## 2018-11-12 NOTE — Progress Notes (Signed)
Gynecologic Oncology Interval Visit  ° °Referring Provider: Dr. Rao ° °Chief Complaint: high grade serous adenocarcinoma ° °Subjective:  °Meredith Jones is a 63 y.o. GOPO female, diagnosed with high grade serous adenocarcinoma, s/p 4 cycles of carbo-taxol-bevacizumab (bevacizumab held for cycle 4) who returns to clinic today for consideration of interval debulking surgery. ° °She received 3 cycles of carbo-taxol-bevacizumab 08/21/2018-10/02/2018.  She received cycle 4 of carbo-Taxol on 10/23/2018.  She received 5 doses of Venofer (10/23/2018-11/06/2018).  She received 4 doses of vitamin B12 (10/23/2018-11/06/2018). ° °Myriad myrisk hereditary cancer and BRCAnalysis genetic testing on 10/29/2018 revealed no clinically significant mutations identified. Negative for BRCA1 and BRCA 2.  Breast cancer risk score 5.5% lifetime risk.  ° °CA 125 °08/08/2018 491.0 °09/11/2018 200 °10/02/2018 78.5 °10/23/2018 31.2 ° °She suffered anemia during chemotherapy.  Last hemoglobin was 9.4 on 10/23/2018.  Since that time she has received vitamin B12 and iron infusions. ° ° ° °Gynecologic Oncology History:  °Meredith Jones is a pleasant GOPO female, diagnosed with high grade serous adenocarcinoma.  ° °She presented to ER on 07/23/2018 with symptoms of bilateral lower extremity swelling as well as abdominal distention. US abdomen revealed large volume ascites, no overt changes of hepatic cirrhosis and was referred to Dr. Toledo/GI. She underwent US paracentesis on 07/29/2018. 5L of fluid was removed.  ° °Pathology:  °A. ABDOMINAL FLUID; PARACENTESIS:  °- MALIGNANT CELLS PRESENT.  °- ADENOCARCINOMA, MOST CONSISTENT WITH HIGH-GRADE SEROUS CARCINOMA.  ° °08/04/2018- CT C/A/P °IMPRESSION: °1. There are cystic lesions of the enlarged bilateral ovaries, largest lesion on the left measuring at least 6.2 cm (series 2, image 116, series 6, image 76). Findings are highly concerning for ovarian neoplasm, particularly given presence of otherwise unexplained  ascites and abdominal lymphadenopathy. °  °2. There are numerous bulky retroperitoneal lymph nodes, largest left periaortic nodes or conglomerates near the superior mesenteric artery measuring at least 4.4 x 2.9 cm (series 2, image 64), concerning for metastatic disease, or alternately primary malignancy such as lymphoma. ° °Nonobstructive inferior pole left nephrolithiasis. Fusion of T7-T10.  ° °She works as a cashier.  She lives with her boyfriend.  She reports about 35 pound weight loss over the last 4 to 5 months but more significant weight loss of past several years.  Reports that her appetite is good and she is eating well but continues to lose weight.  Denies any pain at this time.  Reports ongoing fatigue. She saw Dr. Rosenow in 2013 for possible endometrial hyperplasia and underwent D/C. Very narrow vagina making visualization of cervix 'almost impossible'.  ° °She saw Dr. Rao on 08/08/2018. Noted to have macrocytic anemia and found to have low B12 levels. TSH- 46.46; FT3 1.1, FT4 0.74. She has seen her endocrinologist and adjusted her levothyroxine. History of type 2 diabetes. Last hA1c was 6.5 (08/04/2018).  ° °CA 125 °08/08/2018 491 ° °08/08/2018- albumin 3.1, hemoglobin 10.8, hct 33.8.  ° °We discussed neoadjuvant chemotherapy vs primary debulking surgery.  ° °CT C/A/P- 10/16/2018-  °1.Demonstrates a marked positive response to therapy with regression of bilateral ovarian lesions and resolution of previously noted retroperitoneal lymphadenopathy. Additionally, °previously noted ascites has resolved. °2. No findings to suggest metastatic disease in the thorax. °3. Aortic atherosclerosis, in addition to left anterior descending coronary artery disease. Please note that although the presence of coronary artery calcium documents the presence of coronary artery disease, the severity of this disease and any potential stenosis cannot be assessed on this non-gated CT examination. Assessment for potential   risk factor  modification, dietary therapy or pharmacologic therapy may be warranted, if clinically indicated. ° °Genetics Testing:MyRisk testing negative for APC, ATM, AXIN2, BARD1, BMPR1A, BRIP1, BRCA1, BRCA2, CDH1, CDK4, CDKN2A, CHEK2, EPCAM (large rearramgement only), HOXB13 (sequencing only), GALNT12, MLH1, MSH2, MSH3 (excluding repetitive portions of exon1) MSH6, MUTYH, NBN, NTHL1, PALB2, PMS2, PTEN, RAD51C, RAD51D, RNF43, SMAD4, STK11, TP53. Sequencing for select regions of POLE and POLD1, and large rearrangement analysis for select regions of GREM1 were negative. ° °Problem List: °Patient Active Problem List  ° Diagnosis Date Noted  °• Need for prophylactic vaccination and inoculation against influenza 11/04/2018  °• Anemia of chronic disease 10/22/2018  °• B12 deficiency 10/22/2018  °• Family history of lung cancer   °• Serous adenocarcinoma (HCC) 08/13/2018  °• Goals of care, counseling/discussion 08/11/2018  °• Loss of weight   °• Benign neoplasm of descending colon   °• Type 2 diabetes mellitus without complication, without long-term current use of insulin (HCC) 06/07/2015  °• Meredith Jones 06/09/2014  °• Cyst of ovary 06/09/2014  °• Bulky or enlarged uterus 06/09/2014  °• Hypercholesteremia 02/09/2007  °• Allergic rhinitis 11/27/2006  °• Endometriosis 11/27/2006  °• Adult hypothyroidism 11/27/2006  ° ° °Past Medical History: °Past Medical History:  °Diagnosis Date  °• Allergy   °• Diabetes mellitus without complication (HCC)   °• Diverticulosis   °• Family history of lung cancer   °• Hyperlipidemia   °• Hypothyroidism   °• Ovarian cancer (HCC) 08/04/2018  ° Chemo tx's.  °• Thyroid disease   ° ° °Past Surgical History: °Past Surgical History:  °Procedure Laterality Date  °• BACK SURGERY  2007  ° Lumbar Surgery  °• BREAST CYST ASPIRATION Right   ° Around 10 years ago. Pt thinks it was the right breast but not sure.  °• CATARACT EXTRACTION Left 2010  ° Right Eye 2011  °• COLONOSCOPY WITH PROPOFOL N/A  09/02/2015  ° Procedure: COLONOSCOPY WITH PROPOFOL;  Surgeon: Darren Wohl, MD;  Location: [facility];  Service: Endoscopy;  Laterality: N/A;  Diabetic - oral meds  °• ESOPHAGOGASTRODUODENOSCOPY (EGD) WITH PROPOFOL N/A 11/03/2015  ° Procedure: ESOPHAGOGASTRODUODENOSCOPY (EGD) WITH PROPOFOL;  Surgeon: Darren Wohl, MD;  Location: [facility];  Service: Endoscopy;  Laterality: N/A;  °• EYE SURGERY Bilateral 06/2014  °  tear duck catherization ( Dr. Brasignton)  °• POLYPECTOMY  09/02/2015  ° Procedure: POLYPECTOMY;  Surgeon: Darren Wohl, MD;  Location: [facility];  Service: Endoscopy;;  °• TONSILLECTOMY  1963  ° ° °Past Gynecologic History:  °As per HPI ° °OB History: P0 °OB History  °No obstetric history on file.  ° ° °Family History: °Family History  °Problem Relation Age of Onset  °• Congestive Heart Failure Mother   °• Diabetes Mother   °• Stroke Mother   °• Hypothyroidism Father   °• Cancer Father   °     lung cancer  °• Cancer Paternal Grandmother   °     stomach  ° ° °Social History: °Social History  ° °Socioeconomic History  °• Marital status: Single  °  Spouse name: Not on file  °• Number of children: Not on file  °• Years of education: Not on file  °• Highest education level: Not on file  °Occupational History  °• Not on file  °Social Needs  °• Financial resource strain: Not on file  °• Food insecurity  °  Worry: Not on file  °  Inability: Not on file  °• Transportation needs  °    Medical: Not on file    Non-medical: Not on file  Tobacco Use   Smoking status: Never Smoker   Smokeless tobacco: Never Used  Substance and Sexual Activity   Alcohol use: No   Drug use: No   Sexual activity: Not on file  Lifestyle   Physical activity    Days per week: Not on file    Minutes per session: Not on file   Stress: Not on file  Relationships   Social connections    Talks on phone: Not on file    Gets together: Not on file    Attends religious service: Not on file    Active  member of club or organization: Not on file    Attends meetings of clubs or organizations: Not on file    Relationship status: Not on file   Intimate partner violence    Fear of current or ex partner: Not on file    Emotionally abused: Not on file    Physically abused: Not on file    Forced sexual activity: Not on file  Other Topics Concern   Not on file  Social History Narrative   Not on file    Allergies: Allergies  Allergen Reactions   Clarithromycin Nausea Only   Doxycycline     nausea, dizziness.   Penicillins Hives    Current Medications: Current Outpatient Medications  Medication Sig Dispense Refill   aspirin 81 MG tablet ASPIRIN, 81MG (Oral Tablet Delayed Release)  1 a week for 0 days  Quantity: 0.00;  Refills: 0   Ordered :02-Oct-2010  Doy Hutching ;  Started 12-Mar-2007 Active     calcipotriene (DOVONOX) 0.005 % ointment Apply 1 application topically as needed. CALCIPOTRIENE, 0.005% (External Ointment) - Historical Medication  apply to skin daily (0.005 %) Active     dexamethasone (DECADRON) 4 MG tablet Take 2 tablets (8 mg total) by mouth daily. Start the day after carboplatin chemotherapy for 3 days. 30 tablet 1   fexofenadine (ALLEGRA) 60 MG tablet Take 60 mg by mouth daily as needed. Reported on 09/01/2015     glipiZIDE (GLUCOTROL) 5 MG tablet TAKE 1 TABLET BY MOUTH DAILY BEFORE BREAKFAST 90 tablet 1   glucose blood (ONE TOUCH ULTRA TEST) test strip USE TO TEST BLOOD SUGAR TWICE A DAY AS INSTRUCTED 100 each 12   levothyroxine (SYNTHROID) 100 MCG tablet Take 1 tablet (100 mcg total) by mouth daily. 90 tablet 1   metFORMIN (GLUCOPHAGE) 500 MG tablet Take 2 tablets (1,000 mg total) by mouth 2 (two) times daily. 360 tablet 0   MULTIPLE VITAMIN PO MULTIVITAMINS (Oral Tablet)  1 Every Day for 0 days  Quantity: 0.00;  Refills: 0   Ordered :02-Oct-2010  Doy Hutching ;  Started 12-Mar-2007 Active     ondansetron (ZOFRAN) 8 MG tablet Take 1 tablet (8 mg  total) by mouth 2 (two) times daily as needed for refractory nausea / vomiting. Start on day 3 after carboplatin chemo. 30 tablet 1   ONETOUCH DELICA LANCETS 09F MISC USE TO TEST BLOOD SUGAR ONCE A DAY 100 each 12   prochlorperazine (COMPAZINE) 10 MG tablet Take 1 tablet (10 mg total) by mouth every 6 (six) hours as needed (Nausea or vomiting). 30 tablet 1   simvastatin (ZOCOR) 20 MG tablet Take 1 tablet (20 mg total) by mouth at bedtime. 90 tablet 1   furosemide (LASIX) 20 MG tablet Take 1 tablet (20 mg total) by mouth daily. (Patient not taking: Reported on 10/22/2018)  20 tablet 0  °• lidocaine-prilocaine (EMLA) cream Apply to affected area once (Patient not taking: Reported on 10/22/2018) 30 g 3  °• LORazepam (ATIVAN) 0.5 MG tablet Take 1 tablet (0.5 mg total) by mouth every 6 (six) hours as needed (Nausea or vomiting). (Patient not taking: Reported on 10/22/2018) 30 tablet 0  °• spironolactone (ALDACTONE) 50 MG tablet Take 1 tablet (50 mg total) by mouth daily. (Patient not taking: Reported on 10/22/2018) 20 tablet 0  ° °No current facility-administered medications for this visit.   ° °Review of Systems °General: fatigue improving  °HEENT: no complaints  °Lungs: no complaints  °Cardiac: no complaints  °GI: diarrhea due to diverticulosis  °GU: no complaints  °Musculoskeletal: stiffness  °Extremities: no complaints  °Skin: no complaints  °Neuro: no complaints  °Endocrine: no complaints  °Psych: no complaints  °  ° ° ° °Objective:  °Physical Examination:  °BP 118/77 (BP Location: Right Arm, Patient Position: Sitting)    Pulse 93    Temp 98.1 °F (36.7 °C) (Tympanic)    Wt 107 lb (48.5 kg)    BMI 18.95 kg/m²   °  °ECOG Performance Status: 1 - Symptomatic but completely ambulatory ° °GENERAL: Patient is a well appearing female in no acute distress °HEENT:  PERRL neck supple with midline trachea. Thyroid without masses.  °NODES:  No cervical, supraclavicular, axillary, or inguinal lymphadenopathy palpated.  °LUNGS:   Clear to auscultation bilaterally.  No wheezes or rhonchi. °HEART:  Regular rate and rhythm. No murmur appreciated. °ABDOMEN:  Soft, nontender.  Positive, normoactive bowel sounds.  °BACK: Nontender °EXTREMITIES:  No peripheral edema.   °SKIN:  Ecchymosis o/w clear with no obvious rashes or skin changes. No nail dyscrasia. °NEURO:  Nonfocal. Well oriented.  Appropriate affect. ° °Pelvic: chaperoned by RN °EGBUS: no lesions °Cervix: no lesions, nontender, mobile °Vagina: no lesions, no discharge or bleeding °Uterus: normal size, nontender, mobile °Adnexa: no palpable masses °Rectovaginal: not performed ° ° ° ° °Lab Review °Labs on site today: °Lab Results  °Component Value Date  ° WBC 9.5 10/23/2018  ° HGB 9.4 (L) 10/23/2018  ° HCT 28.6 (L) 10/23/2018  ° MCV 108.3 (H) 10/23/2018  ° PLT 139 (L) 10/23/2018  ° ° °  Chemistry   °   °Component Value Date/Time  ° NA 138 10/23/2018 0806  ° NA 142 05/31/2017 0836  ° K 4.2 10/23/2018 0806  ° CL 101 10/23/2018 0806  ° CO2 27 10/23/2018 0806  ° BUN 23 10/23/2018 0806  ° BUN 9 05/31/2017 0836  ° CREATININE 0.60 10/23/2018 0806  ° CREATININE 0.57 11/13/2016 0933  ° GLU 89 03/25/2013  °    °Component Value Date/Time  ° CALCIUM 9.7 10/23/2018 0806  ° ALKPHOS 95 10/23/2018 0806  ° AST 31 10/23/2018 0806  ° ALT 29 10/23/2018 0806  ° BILITOT 0.4 10/23/2018 0806  ° BILITOT <0.2 05/31/2017 0836  °  ° °Albumin 4.0  ° °Lab Results  °Component Value Date  ° TSH 46.461 (H) 08/08/2018  °TSH 9.3  10/09/2018 ° °FT3 1.1 elevated 08/08/2018 °FT4 WNL 08/08/2018 ° °Radiologic Imaging: °As per HPI and interval history ° °CT C/A/P- 10/16/2018-  ° ° ° °   °Assessment:  °Keilynn L Danziger is a 63 y.o. 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 Bulky retroperitoneal adenopathy adjacent and surrounding superior mesenteric artery. S/p neoadjuvant chemotherapy with 3 cycles of carboplatin-paclitaxel-bevacizumab following by cycle #4 with  chemotherapy only with excellent   response based on CA125, imaging and exam.  ° °Mild anemia.  ° °Hypoalbuminemia, improved.  ° °Germline testing negative; somatic tumor testing pending. ° °Thyroid disease (elevated TSH and FT3 with normal FT4), followed by Dr. Solum, Endocrinology.  ° °Penicillin allergy ° °Medical co-morbidities complicating care:Diabetes mellitus without complication (HCC), Hyperlipidemia, Hypothyroid disease. Body mass index is 18.95 kg/m². ° ° °Plan:  ° °Problem List Items Addressed This Visit   °  ° Other  ° Serous adenocarcinoma (HCC) - Primary  °  °Other Visit Diagnoses   ° Antineoplastic chemotherapy induced anemia      °  ° ° °We discussed proceeding with interval debulking surgery. She is s/p cycle 4 chemotherapy; no bevacizumab; and doing well.  Anemia approved. Plan for surgery 11/26/2018 with robotic TLH BSO tumor debulking omentectomy possible hand-assisted (would be included under laparotomy for consent), bowel resection, possible ostomy, possible laparotomy. Her consent has already been obtained.  ° °Recheck TFTs/PT/PTT; recommended avoid aspirin for 7-10 days preop.  ° °Risks previously were discussed in detail. These include infection, anesthesia, bleeding, transfusion, wound separation, vaginal cuff dehiscence, medical issues (blood clots, stroke, heart attack, fluid in the lungs, pneumonia, abnormal heart rhythm, death), possible exploratory surgery with larger incision, lymphedema, lymphocyst, allergic reaction, injury to adjacent organs (bowel, bladder, blood vessels, nerves, ureters, uterus). She understands, her questions were addressed, and she agrees with this plan.  ° °Gyn VTE Prophylaxis Algorithm  °Risk factors for VTE (calculator):  °Point value Risk factors  °1 point each acute medical illness  °2 points each age 61-74  °3 points each none in this category  °5 points each  none in this category  ° °Major surgery (>30 min); known malignancy or concern for malignancy  (elevated tumor markers); laparotomy; Very high risk - heparin/UFH and SCDs and extended prophylaxis for 28 days ° ° °The patient's diagnosis, an outline of the further diagnostic and laboratory studies which will be required, the recommendation for surgery, and alternatives were discussed with her and her accompanying family members.  All questions were answered to their satisfaction. 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 ° °I personally had a face to face interaction and evaluated the patient jointly with the NP, Ms. Lauren Allen.  I have reviewed her history and available records and have performed all portions of the physical exam documented by me.  I have discussed the case with the APP and the patient.  I agree with the above documentation, assessment and plan which was fully formulated by me.  Counseling was completed by me.  ° °I personally saw the patient and performed a substantive portion of this encounter in conjunction with the listed APP as documented above. ° °Yaman Grauberger Alvarez Capri Raben, MD ° °CC:  °Dr. Rao ° °

## 2018-11-12 NOTE — H&P (Signed)
Gynecologic Oncology Interval Visit   Referring Provider: Dr. Janese Banks  Chief Complaint: high grade serous adenocarcinoma  Subjective:  Meredith Jones is a 63 y.o. [place] female, diagnosed with high grade serous adenocarcinoma, s/p 4 cycles of carbo-taxol-bevacizumab (bevacizumab held for cycle 4) who returns to clinic today for consideration of interval debulking surgery.  She received 3 cycles of carbo-taxol-bevacizumab 08/21/2018-10/02/2018.  She received cycle 4 of carbo-Taxol on 10/23/2018.  She received 5 doses of Venofer (10/23/2018-11/06/2018).  She received 4 doses of vitamin B12 (10/23/2018-11/06/2018).  Myriad myrisk hereditary cancer and BRCAnalysis genetic testing on 10/29/2018 revealed no clinically significant mutations identified. Negative for BRCA1 and BRCA 2.  Breast cancer risk score 5.5% lifetime risk.   CA 125 08/08/2018 491.0 09/11/2018 200 10/02/2018 78.5 10/23/2018 31.2  She suffered anemia during chemotherapy.  Last hemoglobin was 9.4 on 10/23/2018.  Since that time she has received vitamin B12 and iron infusions.    Gynecologic Oncology History:  Meredith Jones is a pleasant [place] female, diagnosed with high grade serous adenocarcinoma.   She presented to ER on 07/23/2018 with symptoms of bilateral lower extremity swelling as well as abdominal distention. US abdomen revealed large volume ascites, no overt changes of hepatic cirrhosis and was referred to Dr. Tama Headings. She underwent US paracentesis on 07/29/2018. 5L of fluid was removed.   Pathology:  A. ABDOMINAL FLUID; PARACENTESIS:  - MALIGNANT CELLS PRESENT.  - ADENOCARCINOMA, MOST CONSISTENT WITH HIGH-GRADE SEROUS CARCINOMA.   08/04/2018- CT C/A/P IMPRESSION: 1. There are cystic lesions of the enlarged bilateral ovaries, largest lesion on the left measuring at least 6.2 cm (series 2, image 116, series 6, image 76). Findings are highly concerning for ovarian neoplasm, particularly given presence of otherwise unexplained  ascites and abdominal lymphadenopathy.  2. There are numerous bulky retroperitoneal lymph nodes, largest left periaortic nodes or conglomerates near the superior mesenteric artery measuring at least 4.4 x 2.9 cm (series 2, image 64), concerning for metastatic disease, or alternately primary malignancy such as lymphoma.  Nonobstructive inferior pole left nephrolithiasis. Fusion of T7-T10.   She works as a Scientist, water quality.  She lives with her boyfriend.  She reports about 35 pound weight loss over the last 4 to 5 months but more significant weight loss of past several years.  Reports that her appetite is good and she is eating well but continues to lose weight.  Denies any pain at this time.  Reports ongoing fatigue. She saw Dr. Laurey Morale in 2013 for possible endometrial hyperplasia and underwent D/C. Very narrow vagina making visualization of cervix 'almost impossible'.   She saw Dr. Janese Banks on 08/08/2018. Noted to have macrocytic anemia and found to have low B12 levels. TSH- 46.46; FT3 1.1, FT4 0.74. She has seen her endocrinologist and adjusted her levothyroxine. History of type 2 diabetes. Last hA1c was 6.5 (08/04/2018).   CA 125 08/08/2018 491  08/08/2018- albumin 3.1, hemoglobin 10.8, hct 33.8.   We discussed neoadjuvant chemotherapy vs primary debulking surgery.   CT C/A/P- 10/16/2018-  1.Demonstrates a marked positive response to therapy with regression of bilateral ovarian lesions and resolution of previously noted retroperitoneal lymphadenopathy. Additionally, previously noted ascites has resolved. 2. No findings to suggest metastatic disease in the thorax. 3. Aortic atherosclerosis, in addition to left anterior descending coronary artery disease. Please note that although the presence of coronary artery calcium documents the presence of coronary artery disease, the severity of this disease and any potential stenosis cannot be assessed on this non-gated CT examination. Assessment for potential  risk factor  modification, dietary therapy or pharmacologic therapy may be warranted, if clinically indicated.  Genetics Testing:MyRisk testing negative for APC, ATM, AXIN2, BARD1, BMPR1A, BRIP1, BRCA1, BRCA2, CDH1, CDK4, CDKN2A, CHEK2, EPCAM (large rearramgement only), HOXB13 (sequencing only), GALNT12, MLH1, MSH2, MSH3 (excluding repetitive portions of exon1) MSH6, MUTYH, NBN, NTHL1, PALB2, PMS2, PTEN, RAD51C, RAD51D, RNF43, SMAD4, STK11, TP53. Sequencing for select regions of POLE and POLD1, and large rearrangement analysis for select regions of GREM1 were negative.  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   Thyroid disease     Past Surgical History: Past Surgical History:  Procedure Laterality Date   BACK SURGERY  2007   Lumbar Surgery   BREAST CYST ASPIRATION Right    Around 10 years ago. Pt thinks it was the right breast but not sure.   CATARACT EXTRACTION Left 2010   Right Eye 2011   COLONOSCOPY WITH PROPOFOL N/A  09/02/2015   Procedure: COLONOSCOPY WITH PROPOFOL;  Surgeon: [name], MD;  Location: [facility];  Service: Endoscopy;  Laterality: N/A;  Diabetic - oral meds   ESOPHAGOGASTRODUODENOSCOPY (EGD) WITH PROPOFOL N/A 11/03/2015   Procedure: ESOPHAGOGASTRODUODENOSCOPY (EGD) WITH PROPOFOL;  Surgeon: [name], MD;  Location: [facility];  Service: Endoscopy;  Laterality: N/A;   EYE SURGERY Bilateral 06/2014    tear duck catherization ( Dr. Vallarie Mare)   POLYPECTOMY  09/02/2015   Procedure: POLYPECTOMY;  Surgeon: [name], MD;  Location: [facility];  Service: Endoscopy;;   [id number]  [id number]    Past Gynecologic History:  As per HPI  OB History: P0 OB History  No obstetric history on file.    Family History: Family History  Problem Relation Age of Onset   Congestive Heart Failure Mother    Diabetes Mother    Stroke Mother    Hypothyroidism Father    Cancer Father        lung cancer   Cancer Paternal Grandmother        stomach    Social History: Social History   Socioeconomic History   Marital status: Single    Spouse name: Not on file   Number of children: Not on file   Years of education: Not on file   Highest education level: Not on file  Occupational History   Not on file  Social Needs   Financial resource strain: Not on file   Food insecurity    Worry: Not on file    Inability: Not on file   Transportation needs  Medical: Not on file    Non-medical: Not on file  Tobacco Use   Smoking status: Never Smoker   Smokeless tobacco: Never Used  Substance and Sexual Activity   Alcohol use: No   Drug use: No   Sexual activity: Not on file  Lifestyle   Physical activity    Days per week: Not on file    Minutes per session: Not on file   Stress: Not on file  Relationships   Social connections    Talks on phone: Not on file    Gets together: Not on file    Attends religious service: Not on file    Active  member of club or organization: Not on file    Attends meetings of clubs or organizations: Not on file    Relationship status: Not on file   Intimate partner violence    Fear of current or ex partner: Not on file    Emotionally abused: Not on file    Physically abused: Not on file    Forced sexual activity: Not on file  Other Topics Concern   Not on file  Social History Narrative   Not on file    Allergies: Allergies  Allergen Reactions   Clarithromycin Nausea Only   Doxycycline     nausea, dizziness.   Penicillins Hives    Current Medications: Current Outpatient Medications  Medication Sig Dispense Refill   aspirin 81 MG tablet ASPIRIN, 81MG (Oral Tablet Delayed Release)  1 a week for 0 days  Quantity: 0.00;  Refills: 0   Ordered :02-Oct-2010  Doy Hutching ;  Started 12-Mar-2007 Active     calcipotriene (DOVONOX) 0.005 % ointment Apply 1 application topically as needed. CALCIPOTRIENE, 0.005% (External Ointment) - Historical Medication  apply to skin daily (0.005 %) Active     dexamethasone (DECADRON) 4 MG tablet Take 2 tablets (8 mg total) by mouth daily. Start the day after carboplatin chemotherapy for 3 days. 30 tablet 1   fexofenadine (ALLEGRA) 60 MG tablet Take 60 mg by mouth daily as needed. Reported on 09/01/2015     glipiZIDE (GLUCOTROL) 5 MG tablet TAKE 1 TABLET BY MOUTH DAILY BEFORE BREAKFAST 90 tablet 1   glucose blood (ONE TOUCH ULTRA TEST) test strip USE TO TEST BLOOD SUGAR TWICE A DAY AS INSTRUCTED 100 each 12   levothyroxine (SYNTHROID) 100 MCG tablet Take 1 tablet (100 mcg total) by mouth daily. 90 tablet 1   metFORMIN (GLUCOPHAGE) 500 MG tablet Take 2 tablets (1,000 mg total) by mouth 2 (two) times daily. 360 tablet 0   MULTIPLE VITAMIN PO MULTIVITAMINS (Oral Tablet)  1 Every Day for 0 days  Quantity: 0.00;  Refills: 0   Ordered :02-Oct-2010  Doy Hutching ;  Started 12-Mar-2007 Active     ondansetron (ZOFRAN) 8 MG tablet Take 1 tablet (8 mg  total) by mouth 2 (two) times daily as needed for refractory nausea / vomiting. Start on day 3 after carboplatin chemo. 30 tablet 1   ONETOUCH DELICA LANCETS 95M MISC USE TO TEST BLOOD SUGAR ONCE A DAY 100 each 12   prochlorperazine (COMPAZINE) 10 MG tablet Take 1 tablet (10 mg total) by mouth every 6 (six) hours as needed (Nausea or vomiting). 30 tablet 1   simvastatin (ZOCOR) 20 MG tablet Take 1 tablet (20 mg total) by mouth at bedtime. 90 tablet 1   furosemide (LASIX) 20 MG tablet Take 1 tablet (20 mg total) by mouth daily. (Patient not taking: Reported on 10/22/2018)  20 tablet 0   lidocaine-prilocaine (EMLA) cream Apply to affected area once (Patient not taking: Reported on 10/22/2018) 30 g 3   LORazepam (ATIVAN) 0.5 MG tablet Take 1 tablet (0.5 mg total) by mouth every 6 (six) hours as needed (Nausea or vomiting). (Patient not taking: Reported on 10/22/2018) 30 tablet 0   spironolactone (ALDACTONE) 50 MG tablet Take 1 tablet (50 mg total) by mouth daily. (Patient not taking: Reported on 10/22/2018) 20 tablet 0   No current facility-administered medications for this visit.    Review of Systems General: fatigue improving  HEENT: no complaints  Lungs: no complaints  Cardiac: no complaints  GI: diarrhea due to diverticulosis  GU: no complaints  Musculoskeletal: stiffness  Extremities: no complaints  Skin: no complaints  Neuro: no complaints  Endocrine: no complaints  Psych: no complaints       Objective:  Physical Examination:  BP 118/77 (BP Location: Right Arm, Patient Position: Sitting)    Pulse 93    Temp 98.1 F (36.7 C) (Tympanic)    Wt 107 lb (48.5 kg)    BMI 18.95 kg/m     ECOG Performance Status: 1 - Symptomatic but completely ambulatory  GENERAL: Patient is a well appearing female in no acute distress HEENT:  PERRL neck supple with midline trachea. Thyroid without masses.  NODES:  No cervical, supraclavicular, axillary, or inguinal lymphadenopathy palpated.  LUNGS:   Clear to auscultation bilaterally.  No wheezes or rhonchi. HEART:  Regular rate and rhythm. No murmur appreciated. ABDOMEN:  Soft, nontender.  Positive, normoactive bowel sounds.  BACK: Nontender EXTREMITIES:  No peripheral edema.   SKIN:  Ecchymosis o/w clear with no obvious rashes or skin changes. No nail dyscrasia. NEURO:  Nonfocal. Well oriented.  Appropriate affect.  Pelvic: chaperoned by RN EGBUS: no lesions Cervix: no lesions, nontender, mobile Vagina: no lesions, no discharge or bleeding Uterus: normal size, nontender, mobile Adnexa: no palpable masses Rectovaginal: not performed     Lab Review Labs on site today: Lab Results  Component Value Date   WBC 9.5 10/23/2018   HGB 9.4 (L) 10/23/2018   HCT 28.6 (L) 10/23/2018   MCV 108.3 (H) 10/23/2018   PLT 139 (L) 10/23/2018      Chemistry      Component Value Date/Time   NA 138 10/23/2018 0806   NA 142 05/31/2017 0836   K 4.2 10/23/2018 0806   CL 101 10/23/2018 0806   CO2 27 10/23/2018 0806   BUN 23 10/23/2018 0806   BUN 9 05/31/2017 0836   CREATININE 0.60 10/23/2018 0806   CREATININE 0.57 11/13/2016 0933   GLU 89 03/25/2013      Component Value Date/Time   CALCIUM 9.7 10/23/2018 0806   ALKPHOS 95 10/23/2018 0806   AST 31 10/23/2018 0806   ALT 29 10/23/2018 0806   BILITOT 0.4 10/23/2018 0806   BILITOT <0.2 05/31/2017 0836     Albumin 4.0   Lab Results  Component Value Date   TSH 46.461 (H) 08/08/2018  TSH 9.3  10/09/2018  FT3 1.1 elevated 08/08/2018 FT4 WNL 08/08/2018  Radiologic Imaging: As per HPI and interval history  CT C/A/P- 10/16/2018-        Assessment:  Meredith Jones is a 63 y.o. 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 Bulky retroperitoneal adenopathy adjacent and surrounding superior mesenteric artery. S/p neoadjuvant chemotherapy with 3 cycles of carboplatin-paclitaxel-bevacizumab following by cycle #4 with  chemotherapy only with excellent  response based on CA125, imaging and exam.   Mild anemia.   Hypoalbuminemia, improved.   Germline testing negative; somatic tumor testing pending.  Thyroid disease (elevated TSH and FT3 with normal FT4), followed by Dr. Gabriel Carina, Endocrinology.   Penicillin allergy  Medical co-morbidities complicating care:Diabetes mellitus without complication ([place]), Hyperlipidemia, Hypothyroid disease. Body mass index is 18.95 kg/m.   Plan:   Problem List Items Addressed This Visit      Other   Serous adenocarcinoma ([place]) - Primary    Other Visit Diagnoses    Antineoplastic chemotherapy induced anemia          We discussed proceeding with interval debulking surgery. She is s/p cycle 4 chemotherapy; no bevacizumab; and doing well.  Anemia approved. Plan for surgery 11/26/2018 with robotic TLH BSO tumor debulking omentectomy possible hand-assisted (would be included under laparotomy for consent), bowel resection, possible ostomy, possible laparotomy. Her consent has already been obtained.   Recheck TFTs/PT/PTT; recommended avoid aspirin for 7-10 days preop.   Risks previously were discussed in detail. These include infection, anesthesia, bleeding, transfusion, wound separation, vaginal cuff dehiscence, medical issues (blood clots, stroke, heart attack, fluid in the lungs, pneumonia, abnormal heart rhythm, death), possible exploratory surgery with larger incision, lymphedema, lymphocyst, allergic reaction, injury to adjacent organs (bowel, bladder, blood vessels, nerves, ureters, uterus). She understands, her questions were addressed, and she agrees with this plan.   Gyn VTE Prophylaxis Algorithm  Risk factors for VTE (calculator):  Point value Risk factors  1 point each acute medical illness  2 points each age 73-74  3 points each none in this category  5 points each  none in this category   Major surgery (>30 min); known malignancy or concern for malignancy  (elevated tumor markers); laparotomy; Very high risk - heparin/UFH and SCDs and extended prophylaxis for 28 days   The patient's diagnosis, an outline of the further diagnostic and laboratory studies which will be required, the recommendation for surgery, and alternatives were discussed with her and her accompanying family members.  All questions were answered to their satisfaction.  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  I personally had a face to face interaction and evaluated the patient jointly with the NP, Ms. Beckey Rutter.  I have reviewed her history and available records and have performed all portions of the physical exam documented by me.  I have discussed the case with the APP and the patient.  I agree with the above documentation, assessment and plan which was fully formulated by me.  Counseling was completed by me.   I personally saw the patient and performed a substantive portion of this encounter in conjunction with the listed APP as documented above.  Lowell Makara Gaetana Michaelis, MD  CC:  Dr. Janese Banks

## 2018-11-12 NOTE — Patient Instructions (Signed)
Stop aspirin 10 days before surgery. Take last dose 11/15/18

## 2018-11-13 ENCOUNTER — Other Ambulatory Visit: Payer: Self-pay

## 2018-11-13 ENCOUNTER — Inpatient Hospital Stay
Payer: Commercial Managed Care - PPO | Attending: Hospice and Palliative Medicine | Admitting: Hospice and Palliative Medicine

## 2018-11-13 VITALS — BP 111/66 | HR 86 | Temp 97.9°F | Wt 106.4 lb

## 2018-11-13 DIAGNOSIS — Z79899 Other long term (current) drug therapy: Secondary | ICD-10-CM | POA: Insufficient documentation

## 2018-11-13 DIAGNOSIS — Z515 Encounter for palliative care: Secondary | ICD-10-CM

## 2018-11-13 DIAGNOSIS — C801 Malignant (primary) neoplasm, unspecified: Secondary | ICD-10-CM | POA: Diagnosis not present

## 2018-11-13 DIAGNOSIS — E039 Hypothyroidism, unspecified: Secondary | ICD-10-CM | POA: Insufficient documentation

## 2018-11-13 DIAGNOSIS — D509 Iron deficiency anemia, unspecified: Secondary | ICD-10-CM | POA: Insufficient documentation

## 2018-11-13 DIAGNOSIS — E785 Hyperlipidemia, unspecified: Secondary | ICD-10-CM | POA: Insufficient documentation

## 2018-11-13 DIAGNOSIS — K219 Gastro-esophageal reflux disease without esophagitis: Secondary | ICD-10-CM | POA: Insufficient documentation

## 2018-11-13 DIAGNOSIS — R188 Other ascites: Secondary | ICD-10-CM | POA: Insufficient documentation

## 2018-11-13 DIAGNOSIS — E538 Deficiency of other specified B group vitamins: Secondary | ICD-10-CM | POA: Insufficient documentation

## 2018-11-13 DIAGNOSIS — Z90710 Acquired absence of both cervix and uterus: Secondary | ICD-10-CM | POA: Insufficient documentation

## 2018-11-13 DIAGNOSIS — Z7984 Long term (current) use of oral hypoglycemic drugs: Secondary | ICD-10-CM | POA: Insufficient documentation

## 2018-11-13 DIAGNOSIS — Z90722 Acquired absence of ovaries, bilateral: Secondary | ICD-10-CM | POA: Insufficient documentation

## 2018-11-13 LAB — T3, FREE: T3, Free: 1.4 pg/mL — ABNORMAL LOW (ref 2.0–4.4)

## 2018-11-13 NOTE — Progress Notes (Signed)
[facility]  [phone] [phone]   Name: Meredith Jones Date: 11/13/2018 MRN: [id number]  DOB: 01-Mar-1955  Patient Care Team: Mar Daring, PA-C as PCP - General (Family Medicine) Clent Jacks, RN as Oncology Nurse Navigator    REASON FOR CONSULTATION: Palliative Care consult requested for this 63 y.o. 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  Abdominal CT revealed cystic lesions in the bilateral ovaries with numerous bulky retroperitoneal lymph nodes and a large amount of ascites.  She has required therapeutic paracentesis.  Patient is being followed by GYN oncology with plan for neoadjuvant chemotherapy and possible later surgery.  Patient was referred to palliative care to help address goals and manage ongoing symptoms.   SOCIAL HISTORY:     reports that she has never smoked. She has never used smokeless tobacco. She reports that she does not drink alcohol or use drugs.   Patient is not married and has no children.  Her parents are deceased and she has no siblings.  She lives at home with a boyfriend.  Patient previously worked as a Scientist, water quality at [employer].  ADVANCE DIRECTIVES:  Does not have  CODE STATUS: Full Code (MOST form completed on 10/02/18)  PAST MEDICAL HISTORY: Past Medical History:  Diagnosis Date   Allergy    Diabetes mellitus without complication ([place])    Diverticulosis    Family history of lung cancer    Hyperlipidemia    Hypothyroidism    Ovarian cancer ([place]) 08/04/2018   Chemo tx's.   Thyroid disease     PAST SURGICAL HISTORY:  Past Surgical History:  Procedure Laterality Date   BACK SURGERY  2007   Lumbar Surgery   BREAST CYST ASPIRATION Right    Around 10 years ago. Pt thinks it was the right breast but not sure.   CATARACT EXTRACTION Left 2010   Right Eye 2011   COLONOSCOPY  WITH PROPOFOL N/A 09/02/2015   Procedure: COLONOSCOPY WITH PROPOFOL;  Surgeon: [name], MD;  Location: [facility];  Service: Endoscopy;  Laterality: N/A;  Diabetic - oral meds   ESOPHAGOGASTRODUODENOSCOPY (EGD) WITH PROPOFOL N/A 11/03/2015   Procedure: ESOPHAGOGASTRODUODENOSCOPY (EGD) WITH PROPOFOL;  Surgeon: [name], MD;  Location: [facility];  Service: Endoscopy;  Laterality: N/A;   EYE SURGERY Bilateral 06/2014    tear duck catherization ( Dr. Vallarie Mare)   POLYPECTOMY  09/02/2015   Procedure: POLYPECTOMY;  Surgeon: [name], MD;  Location: [facility];  Service: Endoscopy;;   TONSILLECTOMY  1963    HEMATOLOGY/ONCOLOGY HISTORY:  Oncology History  Serous adenocarcinoma ([place])  08/13/2018 Initial Diagnosis   Serous adenocarcinoma ([place])   08/14/2018 Cancer Staging   Staging form: Exocrine Pancreas, AJCC 8th Edition - Clinical stage from 08/14/2018: Stage III (cT3, cN2, cM0) - Signed by Sindy Guadeloupe, MD on 08/17/2018   08/21/2018 -  Chemotherapy   The patient had palonosetron (ALOXI) injection 0.25 mg, 0.25 mg, Intravenous,  Once, 4 of 6 cycles Administration: 0.25 mg (08/21/2018), 0.25 mg (09/11/2018), 0.25 mg (10/02/2018), 0.25 mg (10/23/2018) pegfilgrastim (NEULASTA ONPRO KIT) injection 6 mg, 6 mg, Subcutaneous, Once, 4 of 6 cycles Administration: 6 mg (08/21/2018), 6 mg (09/11/2018), 6 mg (10/02/2018), 6 mg (10/23/2018) bevacizumab (AVASTIN) 800 mg in sodium chloride 0.9 % 100 mL chemo infusion, 15 mg/kg = 800 mg (100 % of original dose 15 mg/kg), Intravenous,  Once, 1 of 1  cycle Dose modification: 15 mg/kg (original dose 15 mg/kg, Cycle 1) Administration: 800 mg (08/21/2018) CARBOplatin (PARAPLATIN) 430 mg in sodium chloride 0.9 % 250 mL chemo infusion, 430 mg (100 % of original dose 429.5 mg), Intravenous,  Once, 4 of 6 cycles Dose modification:   (original dose 429.5 mg, Cycle 1) Administration: 430 mg (08/21/2018), 430 mg (09/11/2018), 430 mg (10/02/2018), 430 mg  (10/23/2018) PACLitaxel (TAXOL) 270 mg in sodium chloride 0.9 % 250 mL chemo infusion (> '80mg'$ /m2), 175 mg/m2 = 270 mg, Intravenous,  Once, 4 of 6 cycles Administration: 270 mg (08/21/2018), 270 mg (09/11/2018), 270 mg (10/02/2018), 270 mg (10/23/2018) fosaprepitant (EMEND) 150 mg, dexamethasone (DECADRON) 12 mg in sodium chloride 0.9 % 145 mL IVPB, , Intravenous,  Once, 4 of 6 cycles Administration:  (08/21/2018),  (09/11/2018),  (10/02/2018),  (10/23/2018) bevacizumab-awwb (MVASI) 800 mg in sodium chloride 0.9 % 100 mL chemo infusion, 15 mg/kg = 800 mg (100 % of original dose 15 mg/kg), Intravenous,  Once, 2 of 2 cycles Dose modification: 15 mg/kg (original dose 15 mg/kg, Cycle 2) Administration: 800 mg (09/11/2018), 800 mg (10/02/2018)  for chemotherapy treatment.      ALLERGIES:  is allergic to clarithromycin; doxycycline; and penicillins.  MEDICATIONS:  Current Outpatient Medications  Medication Sig Dispense Refill   aspirin 81 MG tablet ASPIRIN, '81MG'$  (Oral Tablet Delayed Release)  1 a week for 0 days  Quantity: 0.00;  Refills: 0   Ordered :02-Oct-2010  Doy Hutching ;  Started 12-Mar-2007 Active     calcipotriene (DOVONOX) 0.005 % ointment Apply 1 application topically as needed. CALCIPOTRIENE, 0.005% (External Ointment) - Historical Medication  apply to skin daily (0.005 %) Active     dexamethasone (DECADRON) 4 MG tablet Take 2 tablets (8 mg total) by mouth daily. Start the day after carboplatin chemotherapy for 3 days. 30 tablet 1   fexofenadine (ALLEGRA) 60 MG tablet Take 60 mg by mouth daily as needed. Reported on 09/01/2015     furosemide (LASIX) 20 MG tablet Take 1 tablet (20 mg total) by mouth daily. 20 tablet 0   glipiZIDE (GLUCOTROL) 5 MG tablet TAKE 1 TABLET BY MOUTH DAILY BEFORE BREAKFAST 90 tablet 1   glucose blood (ONE TOUCH ULTRA TEST) test strip USE TO TEST BLOOD SUGAR TWICE A DAY AS INSTRUCTED 100 each 12   levothyroxine (SYNTHROID) 100 MCG tablet Take 1 tablet (100 mcg  total) by mouth daily. 90 tablet 1   lidocaine-prilocaine (EMLA) cream Apply to affected area once 30 g 3   LORazepam (ATIVAN) 0.5 MG tablet Take 1 tablet (0.5 mg total) by mouth every 6 (six) hours as needed (Nausea or vomiting). 30 tablet 0   metFORMIN (GLUCOPHAGE) 500 MG tablet Take 2 tablets (1,000 mg total) by mouth 2 (two) times daily. 360 tablet 0   MULTIPLE VITAMIN PO MULTIVITAMINS (Oral Tablet)  1 Every Day for 0 days  Quantity: 0.00;  Refills: 0   Ordered :02-Oct-2010  Doy Hutching ;  Started 12-Mar-2007 Active     ondansetron (ZOFRAN) 8 MG tablet Take 1 tablet (8 mg total) by mouth 2 (two) times daily as needed for refractory nausea / vomiting. Start on day 3 after carboplatin chemo. 30 tablet 1   ONETOUCH DELICA LANCETS 42V MISC USE TO TEST BLOOD SUGAR ONCE A DAY 100 each 12   prochlorperazine (COMPAZINE) 10 MG tablet Take 1 tablet (10 mg total) by mouth every 6 (six) hours as needed (Nausea or vomiting). 30 tablet 1   simvastatin (ZOCOR) 20 MG  tablet Take 1 tablet (20 mg total) by mouth at bedtime. 90 tablet 1   spironolactone (ALDACTONE) 50 MG tablet Take 1 tablet (50 mg total) by mouth daily. 20 tablet 0   No current facility-administered medications for this visit.     VITAL SIGNS: BP 111/66 (BP Location: Left Arm, Patient Position: Sitting)    Pulse 86    Temp 97.9 F (36.6 C) (Tympanic)    Wt 106 lb 7 oz (48.3 kg)    BMI 18.85 kg/m  Filed Weights   11/13/18 1022  Weight: 106 lb 7 oz (48.3 kg)    Estimated body mass index is 18.85 kg/m as calculated from the following:   Height as of 10/22/18: '5\' 3"'$  (1.6 m).   Weight as of this encounter: 106 lb 7 oz (48.3 kg).  LABS: CBC:    Component Value Date/Time   WBC 9.5 10/23/2018 0806   HGB 9.4 (L) 10/23/2018 0806   HGB 10.2 (L) 05/31/2017 0836   HCT 28.6 (L) 10/23/2018 0806   HCT 31.4 (L) 05/31/2017 0836   PLT 139 (L) 10/23/2018 0806   PLT 374 05/31/2017 0836   MCV 108.3 (H) 10/23/2018 0806   MCV 100 (H)  05/31/2017 0836   NEUTROABS 7.8 (H) 10/23/2018 0806   NEUTROABS 3.4 05/31/2017 0836   LYMPHSABS 0.8 10/23/2018 0806   LYMPHSABS 1.0 05/31/2017 0836   MONOABS 0.8 10/23/2018 0806   EOSABS 0.0 10/23/2018 0806   EOSABS 0.4 05/31/2017 0836   BASOSABS 0.1 10/23/2018 0806   BASOSABS 0.0 05/31/2017 0836   Comprehensive Metabolic Panel:    Component Value Date/Time   NA 138 10/23/2018 0806   NA 142 05/31/2017 0836   K 4.2 10/23/2018 0806   CL 101 10/23/2018 0806   CO2 27 10/23/2018 0806   BUN 23 10/23/2018 0806   BUN 9 05/31/2017 0836   CREATININE 0.60 10/23/2018 0806   CREATININE 0.57 11/13/2016 0933   GLUCOSE 198 (H) 10/23/2018 0806   CALCIUM 9.7 10/23/2018 0806   AST 31 10/23/2018 0806   ALT 29 10/23/2018 0806   ALKPHOS 95 10/23/2018 0806   BILITOT 0.4 10/23/2018 0806   BILITOT <0.2 05/31/2017 0836   PROT 7.7 10/23/2018 0806   PROT 6.1 05/31/2017 0836   ALBUMIN 4.0 10/23/2018 0806   ALBUMIN 3.4 (L) 05/31/2017 0836    RADIOGRAPHIC STUDIES: Ct Chest W Contrast  Result Date: 10/16/2018 CLINICAL DATA:  63 year old female with history of ovarian cancer. Restaging examination. EXAM: CT CHEST, ABDOMEN, AND PELVIS WITH CONTRAST TECHNIQUE: Multidetector CT imaging of the chest, abdomen and pelvis was performed following the standard protocol during bolus administration of intravenous contrast. CONTRAST:  59m OMNIPAQUE IOHEXOL 300 MG/ML  SOLN COMPARISON:  CT the chest, abdomen and pelvis 08/04/2018. FINDINGS: CT CHEST FINDINGS Cardiovascular: Heart size is normal. There is no significant pericardial fluid, thickening or pericardial calcification. There is aortic atherosclerosis, as well as atherosclerosis of the great vessels of the mediastinum and the coronary arteries, including calcified atherosclerotic plaque in the left anterior descending coronary artery. Mediastinum/Nodes: No pathologically enlarged mediastinal or hilar lymph nodes. Esophagus is unremarkable in appearance. No axillary  lymphadenopathy. Lungs/Pleura: Mild linear scarring in the left lower lobe, unchanged. No suspicious appearing pulmonary nodules or masses are noted. No acute consolidative airspace disease. No pleural effusions. Musculoskeletal: Orthopedic fixation hardware in the midthoracic spine. There are no aggressive appearing lytic or blastic lesions noted in the visualized portions of the skeleton. CT ABDOMEN PELVIS FINDINGS Hepatobiliary: No suspicious cystic or  solid hepatic lesions. No intra or extrahepatic biliary ductal dilatation. Gallbladder is normal in appearance. Pancreas: No pancreatic mass. No pancreatic ductal dilatation. No pancreatic or peripancreatic fluid collections or inflammatory changes. Spleen: Unremarkable. Adrenals/Urinary Tract: Bilateral kidneys and adrenal glands are normal in appearance. No hydroureteronephrosis. Urinary bladder is nearly decompressed, but otherwise normal in appearance. Stomach/Bowel: Normal appearance of the stomach. No pathologic dilatation of small bowel or colon. The appendix is not confidently identified and may be surgically absent. Regardless, there are no inflammatory changes noted adjacent to the cecum to suggest the presence of an acute appendicitis at this time. Vascular/Lymphatic: Atherosclerosis throughout the abdominal and pelvic vasculature, without evidence of aneurysm or dissection. Previously noted retroperitoneal lymphadenopathy has regressed. No new lymphadenopathy noted elsewhere in the abdomen or pelvis. Reproductive: Previously noted complex cystic and solid ovarian lesions have regressed compared to the prior examination. Right ovary currently measures approximately 4.6 x 3.2 x 3.6 cm (axial image 113 of series 2 and coronal image 71 of series 4), while the left ovary currently measures 4.9 x 3.7 x 5.6 cm (axial image 118 of series 2 and coronal image 76 of series 4). The right ovary is mixed cystic and solid, but predominantly solid in appearance, while  the left ovary is now predominantly cystic in appearance. Uterus is either atrophic or surgically absent. Other: No significant volume of ascites.  No pneumoperitoneum. Musculoskeletal: There are no aggressive appearing lytic or blastic lesions noted in the visualized portions of the skeleton. IMPRESSION: 1. Today's study demonstrates a marked positive response to therapy with regression of bilateral ovarian lesions and resolution of previously noted retroperitoneal lymphadenopathy. Additionally, previously noted ascites has resolved. 2. No findings to suggest metastatic disease in the thorax. 3. Aortic atherosclerosis, in addition to left anterior descending coronary artery disease. Please note that although the presence of coronary artery calcium documents the presence of coronary artery disease, the severity of this disease and any potential stenosis cannot be assessed on this non-gated CT examination. Assessment for potential risk factor modification, dietary therapy or pharmacologic therapy may be warranted, if clinically indicated. Electronically Signed   By: Vinnie Langton M.D.   On: 10/16/2018 13:50   Ct Abdomen Pelvis W Contrast  Result Date: 10/16/2018 CLINICAL DATA:  63 year old female with history of ovarian cancer. Restaging examination. EXAM: CT CHEST, ABDOMEN, AND PELVIS WITH CONTRAST TECHNIQUE: Multidetector CT imaging of the chest, abdomen and pelvis was performed following the standard protocol during bolus administration of intravenous contrast. CONTRAST:  88m OMNIPAQUE IOHEXOL 300 MG/ML  SOLN COMPARISON:  CT the chest, abdomen and pelvis 08/04/2018. FINDINGS: CT CHEST FINDINGS Cardiovascular: Heart size is normal. There is no significant pericardial fluid, thickening or pericardial calcification. There is aortic atherosclerosis, as well as atherosclerosis of the great vessels of the mediastinum and the coronary arteries, including calcified atherosclerotic plaque in the left anterior  descending coronary artery. Mediastinum/Nodes: No pathologically enlarged mediastinal or hilar lymph nodes. Esophagus is unremarkable in appearance. No axillary lymphadenopathy. Lungs/Pleura: Mild linear scarring in the left lower lobe, unchanged. No suspicious appearing pulmonary nodules or masses are noted. No acute consolidative airspace disease. No pleural effusions. Musculoskeletal: Orthopedic fixation hardware in the midthoracic spine. There are no aggressive appearing lytic or blastic lesions noted in the visualized portions of the skeleton. CT ABDOMEN PELVIS FINDINGS Hepatobiliary: No suspicious cystic or solid hepatic lesions. No intra or extrahepatic biliary ductal dilatation. Gallbladder is normal in appearance. Pancreas: No pancreatic mass. No pancreatic ductal dilatation. No pancreatic or peripancreatic  fluid collections or inflammatory changes. Spleen: Unremarkable. Adrenals/Urinary Tract: Bilateral kidneys and adrenal glands are normal in appearance. No hydroureteronephrosis. Urinary bladder is nearly decompressed, but otherwise normal in appearance. Stomach/Bowel: Normal appearance of the stomach. No pathologic dilatation of small bowel or colon. The appendix is not confidently identified and may be surgically absent. Regardless, there are no inflammatory changes noted adjacent to the cecum to suggest the presence of an acute appendicitis at this time. Vascular/Lymphatic: Atherosclerosis throughout the abdominal and pelvic vasculature, without evidence of aneurysm or dissection. Previously noted retroperitoneal lymphadenopathy has regressed. No new lymphadenopathy noted elsewhere in the abdomen or pelvis. Reproductive: Previously noted complex cystic and solid ovarian lesions have regressed compared to the prior examination. Right ovary currently measures approximately 4.6 x 3.2 x 3.6 cm (axial image 113 of series 2 and coronal image 71 of series 4), while the left ovary currently measures 4.9 x 3.7  x 5.6 cm (axial image 118 of series 2 and coronal image 76 of series 4). The right ovary is mixed cystic and solid, but predominantly solid in appearance, while the left ovary is now predominantly cystic in appearance. Uterus is either atrophic or surgically absent. Other: No significant volume of ascites.  No pneumoperitoneum. Musculoskeletal: There are no aggressive appearing lytic or blastic lesions noted in the visualized portions of the skeleton. IMPRESSION: 1. Today's study demonstrates a marked positive response to therapy with regression of bilateral ovarian lesions and resolution of previously noted retroperitoneal lymphadenopathy. Additionally, previously noted ascites has resolved. 2. No findings to suggest metastatic disease in the thorax. 3. Aortic atherosclerosis, in addition to left anterior descending coronary artery disease. Please note that although the presence of coronary artery calcium documents the presence of coronary artery disease, the severity of this disease and any potential stenosis cannot be assessed on this non-gated CT examination. Assessment for potential risk factor modification, dietary therapy or pharmacologic therapy may be warranted, if clinically indicated. Electronically Signed   By: Vinnie Langton M.D.   On: 10/16/2018 13:50    PERFORMANCE STATUS (ECOG) : 0 - Asymptomatic  Review of Systems Unless otherwise noted, a complete review of systems is negative.  Physical Exam General: NAD,  thin Pulmonary: Unlabored breathing, CTA ant/post fields Cards: RRR Abdomen: soft, nttp Extremities: trace lower extremity edema Skin: no rashes Neurological: Weakness but otherwise nonfocal  IMPRESSION: Patient seen in the clinic for routine follow-up.  She reports doing well. She denies any changes or concerns since she was last seen. Patient did see Dr. Theora Gianotti yesterday and is planned to receive debulking surgery/TLH BSO later this month.   Patient denies distressing  symptoms at present. She has had increased anxiety at times and we discussed nonpharmacological strategies for coping. She finds that listening to the Beatles helps. I offered to start SSRI but patient declined.   She reports good support at home. Her boyfriend is very involved and helps with her care when needed.   Weight trending down slightly over past month from 115 to 106lbs. She reports good oral intake. She is being followed by Desmond Lope, RD.   PLAN: -Continue current scope of treatment -RTC in 1 month   Patient expressed understanding and was in agreement with this plan. She also understands that She can call the clinic at any time with any questions, concerns, or complaints.     Time Total: 20 minutes  Visit consisted of counseling and education dealing with the complex and emotionally intense issues of symptom management and palliative care  in the setting of serious and potentially life-threatening illness.Greater than 50%  of this time was spent counseling and coordinating care related to the above assessment and plan.  Signed by: Altha Harm, PhD, NP-C [phone] (Work Cell)

## 2018-11-21 ENCOUNTER — Inpatient Hospital Stay: Payer: Commercial Managed Care - PPO

## 2018-11-21 ENCOUNTER — Encounter
Admission: RE | Admit: 2018-11-21 | Discharge: 2018-11-21 | Disposition: A | Discharge status: Home / Self Care | Payer: Commercial Managed Care - PPO | Source: Ambulatory Visit | Attending: Obstetrics and Gynecology | Admitting: Obstetrics and Gynecology

## 2018-11-21 ENCOUNTER — Other Ambulatory Visit: Payer: Self-pay

## 2018-11-21 ENCOUNTER — Inpatient Hospital Stay (HOSPITAL_BASED_OUTPATIENT_CLINIC_OR_DEPARTMENT_OTHER): Payer: Commercial Managed Care - PPO | Admitting: Oncology

## 2018-11-21 ENCOUNTER — Other Ambulatory Visit: Payer: Self-pay | Admitting: Oncology

## 2018-11-21 ENCOUNTER — Encounter: Payer: Self-pay | Admitting: Oncology

## 2018-11-21 VITALS — BP 119/82 | HR 93 | Temp 98.7°F | Ht 65.0 in | Wt 106.0 lb

## 2018-11-21 DIAGNOSIS — Z01812 Encounter for preprocedural laboratory examination: Secondary | ICD-10-CM | POA: Insufficient documentation

## 2018-11-21 DIAGNOSIS — E785 Hyperlipidemia, unspecified: Secondary | ICD-10-CM | POA: Diagnosis not present

## 2018-11-21 DIAGNOSIS — D509 Iron deficiency anemia, unspecified: Secondary | ICD-10-CM

## 2018-11-21 DIAGNOSIS — Z90710 Acquired absence of both cervix and uterus: Secondary | ICD-10-CM | POA: Diagnosis not present

## 2018-11-21 DIAGNOSIS — K219 Gastro-esophageal reflux disease without esophagitis: Secondary | ICD-10-CM | POA: Diagnosis not present

## 2018-11-21 DIAGNOSIS — Z20828 Contact with and (suspected) exposure to other viral communicable diseases: Secondary | ICD-10-CM | POA: Insufficient documentation

## 2018-11-21 DIAGNOSIS — E538 Deficiency of other specified B group vitamins: Secondary | ICD-10-CM

## 2018-11-21 DIAGNOSIS — C801 Malignant (primary) neoplasm, unspecified: Secondary | ICD-10-CM

## 2018-11-21 DIAGNOSIS — R188 Other ascites: Secondary | ICD-10-CM | POA: Diagnosis not present

## 2018-11-21 DIAGNOSIS — Z90722 Acquired absence of ovaries, bilateral: Secondary | ICD-10-CM | POA: Diagnosis not present

## 2018-11-21 DIAGNOSIS — Z7984 Long term (current) use of oral hypoglycemic drugs: Secondary | ICD-10-CM | POA: Diagnosis not present

## 2018-11-21 DIAGNOSIS — Z79899 Other long term (current) drug therapy: Secondary | ICD-10-CM | POA: Diagnosis not present

## 2018-11-21 DIAGNOSIS — Z1231 Encounter for screening mammogram for malignant neoplasm of breast: Secondary | ICD-10-CM

## 2018-11-21 DIAGNOSIS — E039 Hypothyroidism, unspecified: Secondary | ICD-10-CM | POA: Diagnosis not present

## 2018-11-21 HISTORY — DX: Gastro-esophageal reflux disease without esophagitis: K21.9

## 2018-11-21 HISTORY — DX: Other specified postprocedural states: Z98.890

## 2018-11-21 HISTORY — DX: Family history of other specified conditions: Z84.89

## 2018-11-21 HISTORY — DX: Nausea with vomiting, unspecified: R11.2

## 2018-11-21 HISTORY — DX: Anemia, unspecified: D64.9

## 2018-11-21 HISTORY — DX: Other complications of anesthesia, initial encounter: T88.59XA

## 2018-11-21 LAB — SARS CORONAVIRUS 2 (TAT 6-24 HRS): SARS Coronavirus 2: NEGATIVE

## 2018-11-21 LAB — TYPE AND SCREEN
ABO/RH(D): A NEG
Antibody Screen: NEGATIVE

## 2018-11-21 LAB — CBC WITH DIFFERENTIAL/PLATELET
Abs Immature Granulocytes: 0.03 10*3/uL (ref 0.00–0.07)
Basophils Absolute: 0 10*3/uL (ref 0.0–0.1)
Basophils Relative: 1 %
Eosinophils Absolute: 0.2 10*3/uL (ref 0.0–0.5)
Eosinophils Relative: 4 %
HCT: 30.9 % — ABNORMAL LOW (ref 36.0–46.0)
Hemoglobin: 10.1 g/dL — ABNORMAL LOW (ref 12.0–15.0)
Immature Granulocytes: 1 %
Lymphocytes Relative: 12 %
Lymphs Abs: 0.8 10*3/uL (ref 0.7–4.0)
MCH: 37.3 pg — ABNORMAL HIGH (ref 26.0–34.0)
MCHC: 32.7 g/dL (ref 30.0–36.0)
MCV: 114 fL — ABNORMAL HIGH (ref 80.0–100.0)
Monocytes Absolute: 0.5 10*3/uL (ref 0.1–1.0)
Monocytes Relative: 8 %
Neutro Abs: 4.7 10*3/uL (ref 1.7–7.7)
Neutrophils Relative %: 74 %
Platelets: 191 10*3/uL (ref 150–400)
RBC: 2.71 MIL/uL — ABNORMAL LOW (ref 3.87–5.11)
RDW: 19.1 % — ABNORMAL HIGH (ref 11.5–15.5)
WBC: 6.2 10*3/uL (ref 4.0–10.5)
nRBC: 0 % (ref 0.0–0.2)

## 2018-11-21 LAB — POTASSIUM: Potassium: 4.1 mmol/L (ref 3.5–5.1)

## 2018-11-21 LAB — CBC
HCT: 30.5 % — ABNORMAL LOW (ref 36.0–46.0)
Hemoglobin: 9.9 g/dL — ABNORMAL LOW (ref 12.0–15.0)
MCH: 36.8 pg — ABNORMAL HIGH (ref 26.0–34.0)
MCHC: 32.5 g/dL (ref 30.0–36.0)
MCV: 113.4 fL — ABNORMAL HIGH (ref 80.0–100.0)
Platelets: 204 10*3/uL (ref 150–400)
RBC: 2.69 MIL/uL — ABNORMAL LOW (ref 3.87–5.11)
RDW: 19 % — ABNORMAL HIGH (ref 11.5–15.5)
WBC: 6.3 10*3/uL (ref 4.0–10.5)
nRBC: 0 % (ref 0.0–0.2)

## 2018-11-21 NOTE — Progress Notes (Signed)
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 She presented with symptoms of bilateral lower extremity swelling as well as abdominal distention.GI. This was followed by a CT abdomen and chest imaging with contrast. CT showed cystic lesions of the enlarged bilateral ovaries the largest lesion on the left measuring 6.2 cm. Patient also noted to have numerous bulky retroperitoneal lymph nodes, largest left periaortic node or conglomerates near the superior mesenteric artery measuring at least 4.4 x 2.9 cm. Significant ascites was present. Patient underwent paracentesis and cytology from the fluid showed high-grade serous carcinoma. Tumor cells were positive for CK7, PAX 8, p53 and WT 1.  Patient seen by GYN onc and neoadjuvant chemotherapy recommended due to buly retroperitoneal adenopathy  Cycle 1 of carbotaxol Avastin neoadjuvant chemotherapy started on 08/21/2018 and completed 4 cycles of treatment on 10/23/2018.  Plan is for surgery on 11/26/2018.  Interval history-patient has received 5 doses of Venofer and B12 so far.  She still feels fatigued but is able to carry on with her ADLs.  Her bilateral lower extremities edema has resolved.  ECOG PS- 1 Pain scale- 0   Review of systems- Review of Systems  Constitutional: Positive for malaise/fatigue. Negative for chills, fever and weight loss.  HENT: Negative for  congestion, ear discharge and nosebleeds.   Eyes: Negative for blurred vision.  Respiratory: Negative for cough, hemoptysis, sputum production, shortness of breath and wheezing.   Cardiovascular: Negative for chest pain, palpitations, orthopnea and claudication.  Gastrointestinal: Negative for abdominal pain, blood in stool, constipation, diarrhea, heartburn, melena, nausea and vomiting.  Genitourinary: Negative for dysuria, flank pain, frequency, hematuria and urgency.  Musculoskeletal: Negative for back pain, joint pain and myalgias.  Skin: Negative for rash.  Neurological: Negative for dizziness, tingling, focal weakness, seizures, weakness and headaches.  Endo/Heme/Allergies: Does not bruise/bleed easily.  Psychiatric/Behavioral: Negative for depression and suicidal ideas. The patient does not have insomnia.        Allergies  Allergen Reactions  . Clarithromycin Nausea Only  . Doxycycline Nausea Only    dizziness  . Penicillins Hives    Did it involve swelling of the face/tongue/throat, SOB, or low BP? No Did it involve sudden or severe rash/hives, skin peeling, or any reaction on the inside of your mouth or nose? No Did you need to seek medical attention at a hospital or doctor's office? No When did it last happen?50+ years ago If all above answers are "NO", may proceed with cephalosporin use.      Past Medical History:  Diagnosis Date  . Allergy   . Anemia   . Complication of anesthesia   . Diabetes mellitus without complication ([place])   . Diverticulosis   . Family history of adverse reaction to anesthesia    mom-n/v  . Family history of lung cancer   . GERD (gastroesophageal reflux disease)    h/o  . Hyperlipidemia   . Hypothyroidism   . Ovarian cancer ([place]) 08/04/2018   Chemo tx's.  Marland Kitchen PONV (postoperative  nausea and vomiting)    sick with tonsillectomy  . Thyroid disease      Past Surgical History:  Procedure Laterality Date  . BACK SURGERY  2007    Lumbar Surgery  . BREAST CYST ASPIRATION Right    Around 10 years ago. Pt thinks it was the right breast but not sure.  Marland Kitchen CATARACT EXTRACTION Left 2010   Right Eye 2011  . COLONOSCOPY WITH PROPOFOL N/A 09/02/2015   Procedure: COLONOSCOPY WITH PROPOFOL;  Surgeon: [name], MD;  Location: [place];  Service: Endoscopy;  Laterality: N/A;  Diabetic - oral meds  . ESOPHAGOGASTRODUODENOSCOPY (EGD) WITH PROPOFOL N/A 11/03/2015   Procedure: ESOPHAGOGASTRODUODENOSCOPY (EGD) WITH PROPOFOL;  Surgeon: [name], MD;  Location: [place];  Service: Endoscopy;  Laterality: N/A;  . EYE SURGERY Bilateral 06/2014    tear duck catherization ( Dr. Vallarie Mare)  . POLYPECTOMY  09/02/2015   Procedure: POLYPECTOMY;  Surgeon: [name], MD;  Location: [place];  Service: Endoscopy;;  . TONSILLECTOMY  1963    Social History   Socioeconomic History  . Marital status: Single    Spouse name: Not on file  . Number of children: Not on file  . Years of education: Not on file  . Highest education level: Not on file  Occupational History  . Not on file  Social Needs  . Financial resource strain: Not on file  . Food insecurity    Worry: Not on file    Inability: Not on file  . Transportation needs    Medical: Not on file    Non-medical: Not on file  Tobacco Use  . Smoking status: Never Smoker  . Smokeless tobacco: Never Used  Substance and Sexual Activity  . Alcohol use: No  . Drug use: No  . Sexual activity: Not on file  Lifestyle  . Physical activity    Days per week: Not on file    Minutes per session: Not on file  . Stress: Not on file  Relationships  . Social Herbalist on phone: Not on file    Gets together: Not on file    Attends religious service: Not on file    Active member of club or organization: Not on file    Attends meetings of clubs or organizations: Not on file    Relationship status: Not on file  . Intimate partner violence    Fear  of current or ex partner: Not on file    Emotionally abused: Not on file    Physically abused: Not on file    Forced sexual activity: Not on file  Other Topics Concern  . Not on file  Social History Narrative  . Not on file    Family History  Problem Relation Age of Onset  . Congestive Heart Failure Mother   . Diabetes Mother   . Stroke Mother   . Hypothyroidism Father   . Cancer Father        lung cancer  . Cancer Paternal Grandmother        stomach     Current Outpatient Medications:  .  dexamethasone (DECADRON) 4 MG tablet, Take 2 tablets (8 mg total) by mouth daily. Start the day after carboplatin chemotherapy for 3 days., Disp: 30 tablet, Rfl: 1 .  fexofenadine (ALLEGRA) 60 MG tablet, Take 60 mg by mouth daily as needed for allergies. , Disp: , Rfl:  .  glipiZIDE (GLUCOTROL) 5 MG tablet, TAKE 1 TABLET BY  MOUTH DAILY BEFORE BREAKFAST (Patient taking differently: Take 5 mg by mouth daily before breakfast. ), Disp: 90 tablet, Rfl: 1 .  glucose blood (ONE TOUCH ULTRA TEST) test strip, USE TO TEST BLOOD SUGAR TWICE A DAY AS INSTRUCTED, Disp: 100 each, Rfl: 12 .  levothyroxine (SYNTHROID) 100 MCG tablet, Take 1 tablet (100 mcg total) by mouth daily. (Patient taking differently: Take 100 mcg by mouth at bedtime. ), Disp: 90 tablet, Rfl: 1 .  lidocaine-prilocaine (EMLA) cream, Apply to affected area once, Disp: 30 g, Rfl: 3 .  LORazepam (ATIVAN) 0.5 MG tablet, Take 1 tablet (0.5 mg total) by mouth every 6 (six) hours as needed (Nausea or vomiting)., Disp: 30 tablet, Rfl: 0 .  metFORMIN (GLUCOPHAGE) 500 MG tablet, Take 2 tablets (1,000 mg total) by mouth 2 (two) times daily., Disp: 360 tablet, Rfl: 0 .  MULTIPLE VITAMIN PO, Take 1 tablet by mouth daily. , Disp: , Rfl:  .  ONETOUCH DELICA LANCETS 40N MISC, USE TO TEST BLOOD SUGAR ONCE A DAY, Disp: 100 each, Rfl: 12 .  simvastatin (ZOCOR) 20 MG tablet, Take 1 tablet (20 mg total) by mouth at bedtime., Disp: 90 tablet, Rfl: 1 .   spironolactone (ALDACTONE) 50 MG tablet, Take 1 tablet (50 mg total) by mouth daily., Disp: 20 tablet, Rfl: 0 .  aspirin 81 MG tablet, Take 81 mg by mouth daily. , Disp: , Rfl:  .  calcipotriene (DOVONOX) 0.005 % ointment, Apply 1 application topically as needed. CALCIPOTRIENE, 0.005% (External Ointment) - Historical Medication  apply to skin daily (0.005 %) Active, Disp: , Rfl:  .  furosemide (LASIX) 20 MG tablet, Take 1 tablet (20 mg total) by mouth daily. (Patient not taking: Reported on 11/21/2018), Disp: 20 tablet, Rfl: 0 .  ondansetron (ZOFRAN) 8 MG tablet, Take 1 tablet (8 mg total) by mouth 2 (two) times daily as needed for refractory nausea / vomiting. Start on day 3 after carboplatin chemo. (Patient not taking: Reported on 11/21/2018), Disp: 30 tablet, Rfl: 1 .  prochlorperazine (COMPAZINE) 10 MG tablet, Take 1 tablet (10 mg total) by mouth every 6 (six) hours as needed (Nausea or vomiting). (Patient not taking: Reported on 11/21/2018), Disp: 30 tablet, Rfl: 1  Physical exam:  Vitals:   11/21/18 1040  BP: 119/82  Pulse: 93  Temp: 98.7 F (37.1 C)  TempSrc: Tympanic  Weight: 106 lb (48.1 kg)  Height: '5\' 5"'$  (1.651 m)   Physical Exam Constitutional:      Comments: Patient is thin and cachectic.  Appears in no acute distress  HENT:     Head: Normocephalic and atraumatic.  Eyes:     Pupils: Pupils are equal, round, and reactive to light.  Neck:     Musculoskeletal: Normal range of motion.  Cardiovascular:     Rate and Rhythm: Normal rate and regular rhythm.     Heart sounds: Normal heart sounds.  Pulmonary:     Effort: Pulmonary effort is normal.     Breath sounds: Normal breath sounds.  Abdominal:     General: Bowel sounds are normal.     Comments: Firm and mildly distended  Musculoskeletal:     Right lower leg: No edema.     Left lower leg: No edema.  Skin:    General: Skin is warm and dry.  Neurological:     Mental Status: She is alert and oriented to person, place, and  time.      CMP Latest Ref Rng & Units 11/21/2018  Glucose 70 - 99 mg/dL -  BUN 8 - 23 mg/dL -  Creatinine 0.44 - 1.00 mg/dL -  Sodium 135 - 145 mmol/L -  Potassium 3.5 - 5.1 mmol/L 4.1  Chloride 98 - 111 mmol/L -  CO2 22 - 32 mmol/L -  Calcium 8.9 - 10.3 mg/dL -  Total Protein 6.5 - 8.1 g/dL -  Total Bilirubin 0.3 - 1.2 mg/dL -  Alkaline Phos 38 - 126 U/L -  AST 15 - 41 U/L -  ALT 0 - 44 U/L -   CBC Latest Ref Rng & Units 11/21/2018  WBC 4.0 - 10.5 K/uL 6.2  Hemoglobin 12.0 - 15.0 g/dL 10.1(L)  Hematocrit 36.0 - 46.0 % 30.9(L)  Platelets 150 - 400 K/uL 191      Assessment and plan- Patient is a 63 y.o. female with serous adenocarcinoma bilateral ovaries at least stage III cT3 N2 M0. She is here for preoperative visit for follow-up of her anemia  Patient did receive 5 doses of Venofer as well as B12 injections in preparation for her surgery on 11/26/2018.  She did have evidence of iron deficiency and B12 deficiency and it may be too early to notice a significant improvement in her hemoglobin.  There has been no significant improvement in her hemoglobin over the last 1 month.  Regardless she has received adequate iron and B12 and hopefully her postoperative recovery of her anemia would be faster.  I will tentatively see her back in 4 weeks time and based on her recovery and performance status plan for 2 more cycles of adjuvant chemotherapy with carbotaxol at that time   Visit Diagnosis 1. Iron deficiency anemia, unspecified iron deficiency anemia type   2. B12 deficiency   3. Serous adenocarcinoma ([place])      Dr. Randa Evens, MD, MPH [facility] at [facility] [phone] 11/21/2018 11:30 AM

## 2018-11-21 NOTE — Progress Notes (Signed)
Met with Meredith Jones in the lobby to answer some questions she had following her PAT appointment. She questioned what she should bring to the hospital with her. Reviewed her instructions she had been given by PAT with her. No further questions at this time. Encouraged her to call me if she needs anything further.

## 2018-11-21 NOTE — Patient Instructions (Signed)
Your procedure is scheduled on: 11-26-18 [facility] Report to Same Day Surgery 2nd floor medical mall [facility] Entrance-take elevator on left to 2nd floor.  Check in with surgery information desk.) To find out your arrival time please call [phone] between 1PM - 3PM on 11-25-18 TUESDAY  Remember: Instructions that are not followed completely may result in serious medical risk, up to and including death, or upon the discretion of your surgeon and anesthesiologist your surgery may need to be rescheduled.    _x___ 1. Do not eat food after midnight the night before your procedure. NO GUM OR CANDY AFTER MIDNIGHT. You may drink clear liquids up to 2 hours before you are scheduled to arrive at the hospital for your procedure.  Do not drink clear liquids within 2 hours of your scheduled arrival to the hospital.  Type 1 and type 2 diabetics should only drink water.   ____Ensure clear carbohydrate drink on the way to the hospital for bariatric patients  __X__GATORADE G2 drink 3 hours before surgery.     __x__ 2. No Alcohol for 24 hours before or after surgery.   __x__3. No Smoking or e-cigarettes for 24 prior to surgery.  Do not use any chewable tobacco products for at least 6 hour prior to surgery   ____  4. Bring all medications with you on the day of surgery if instructed.    __x__ 5. Notify your doctor if there is any change in your medical condition     (cold, fever, infections).    x___6. On the morning of surgery brush your teeth with toothpaste and water.  You may rinse your mouth with mouth wash if you wish.  Do not swallow any toothpaste or mouthwash.   Do not wear jewelry, make-up, hairpins, clips or nail polish.  Do not wear lotions, powders, or perfumes. You may wear deodorant.  Do not shave 48 hours prior to surgery. Men may shave face and neck.  Do not bring valuables to the hospital.    [facility] is not responsible for any belongings or valuables.    Contacts, dentures or bridgework may not be worn into surgery.  Leave your suitcase in the car. After surgery it may be brought to your room.  For patients admitted to the hospital, discharge time is determined by your treatment team.  _  Patients discharged the day of surgery will not be allowed to drive home.  You will need someone to drive you home and stay with you the night of your procedure.    Please read over the following fact sheets that you were given:   [facility] Preparing for Surgery   _x___ Take anti-hypertensive listed below, cardiac, seizure, asthma, anti-reflux and psychiatric medicines. These include:  1. NONE  2.  3.  4.  5.  6.  ____Fleets enema or Magnesium Citrate as directed.   _x___ Use CHG Soap or sage wipes as directed on instruction sheet   ____ Use inhalers on the day of surgery and bring to hospital day of surgery  _X___ Stop Metformin 2 days prior to surgery-LAST DOSE Sunday October 11TH    ____ Take 1/2 of usual insulin dose the night before surgery and none on the morning surgery.   _x___ Follow recommendations from Cardiologist, Pulmonologist or PCP regarding stopping Aspirin, Coumadin, Plavix ,Eliquis, Effient, or Pradaxa, and Pletal-PT HAS ALREADY STOPPED ASPIRIN  X____Stop Anti-inflammatories such as Advil, Aleve, Ibuprofen, Motrin, Naproxen, Naprosyn, Goodies powders or aspirin products NOW-OK to  take Tylenol    ____ Stop supplements until after surgery.     ____ Bring C-Pap to the hospital.

## 2018-11-21 NOTE — Progress Notes (Signed)
Patient would like to know when she could go back to work.

## 2018-11-24 ENCOUNTER — Telehealth: Payer: Self-pay

## 2018-11-24 ENCOUNTER — Encounter: Payer: Self-pay | Admitting: Pathology

## 2018-11-24 ENCOUNTER — Encounter: Payer: Self-pay | Admitting: Oncology

## 2018-11-24 NOTE — Telephone Encounter (Signed)
Myriad myChoice CDx has resulted. Original sent to HIM for scanning.  [facility] HRD Status: NEGATIVE GIS Status: NEGATIVE Tumor Mutation BRCA1/BRCA2 Status: NEGATIVE FOR A CLINICALLY SIGNIFICANT MUTATION

## 2018-11-25 MED ORDER — METRONIDAZOLE IN NACL 5-0.79 MG/ML-% IV SOLN
500.0000 mg | INTRAVENOUS | Status: AC
  Administered 2018-11-26: 08:00:00 500 mg via INTRAVENOUS
  Filled 2018-11-25: qty 100

## 2018-11-25 MED ORDER — CIPROFLOXACIN IN D5W 400 MG/200ML IV SOLN
400.0000 mg | INTRAVENOUS | Status: AC
  Administered 2018-11-26: 08:00:00 400 mg via INTRAVENOUS

## 2018-11-26 ENCOUNTER — Ambulatory Visit: Payer: Commercial Managed Care - PPO | Admitting: Anesthesiology

## 2018-11-26 ENCOUNTER — Observation Stay
Admission: RE | Admit: 2018-11-26 | Discharge: 2018-11-27 | Disposition: A | Discharge status: Home / Self Care | Payer: Commercial Managed Care - PPO | Attending: Obstetrics & Gynecology | Admitting: Obstetrics & Gynecology

## 2018-11-26 ENCOUNTER — Encounter
Admission: RE | Disposition: A | Discharge status: Home / Self Care | Payer: Self-pay | Source: Home / Self Care | Attending: Obstetrics & Gynecology

## 2018-11-26 ENCOUNTER — Other Ambulatory Visit: Payer: Self-pay

## 2018-11-26 DIAGNOSIS — C785 Secondary malignant neoplasm of large intestine and rectum: Secondary | ICD-10-CM | POA: Insufficient documentation

## 2018-11-26 DIAGNOSIS — Z79899 Other long term (current) drug therapy: Secondary | ICD-10-CM | POA: Diagnosis not present

## 2018-11-26 DIAGNOSIS — Z79890 Hormone replacement therapy (postmenopausal): Secondary | ICD-10-CM | POA: Diagnosis not present

## 2018-11-26 DIAGNOSIS — N9971 Accidental puncture and laceration of a genitourinary system organ or structure during a genitourinary system procedure: Secondary | ICD-10-CM | POA: Diagnosis not present

## 2018-11-26 DIAGNOSIS — Y658 Other specified misadventures during surgical and medical care: Secondary | ICD-10-CM | POA: Insufficient documentation

## 2018-11-26 DIAGNOSIS — D630 Anemia in neoplastic disease: Secondary | ICD-10-CM | POA: Diagnosis not present

## 2018-11-26 DIAGNOSIS — Z90710 Acquired absence of both cervix and uterus: Secondary | ICD-10-CM | POA: Diagnosis present

## 2018-11-26 DIAGNOSIS — K219 Gastro-esophageal reflux disease without esophagitis: Secondary | ICD-10-CM | POA: Diagnosis not present

## 2018-11-26 DIAGNOSIS — E039 Hypothyroidism, unspecified: Secondary | ICD-10-CM | POA: Insufficient documentation

## 2018-11-26 DIAGNOSIS — Z9221 Personal history of antineoplastic chemotherapy: Secondary | ICD-10-CM | POA: Diagnosis not present

## 2018-11-26 DIAGNOSIS — C562 Malignant neoplasm of left ovary: Secondary | ICD-10-CM | POA: Diagnosis present

## 2018-11-26 DIAGNOSIS — Z7982 Long term (current) use of aspirin: Secondary | ICD-10-CM | POA: Diagnosis not present

## 2018-11-26 DIAGNOSIS — C7982 Secondary malignant neoplasm of genital organs: Secondary | ICD-10-CM | POA: Insufficient documentation

## 2018-11-26 DIAGNOSIS — C775 Secondary and unspecified malignant neoplasm of intrapelvic lymph nodes: Secondary | ICD-10-CM | POA: Diagnosis not present

## 2018-11-26 DIAGNOSIS — C787 Secondary malignant neoplasm of liver and intrahepatic bile duct: Secondary | ICD-10-CM | POA: Diagnosis not present

## 2018-11-26 DIAGNOSIS — E7800 Pure hypercholesterolemia, unspecified: Secondary | ICD-10-CM | POA: Diagnosis not present

## 2018-11-26 DIAGNOSIS — C561 Malignant neoplasm of right ovary: Secondary | ICD-10-CM | POA: Diagnosis not present

## 2018-11-26 DIAGNOSIS — C801 Malignant (primary) neoplasm, unspecified: Secondary | ICD-10-CM

## 2018-11-26 DIAGNOSIS — E119 Type 2 diabetes mellitus without complications: Secondary | ICD-10-CM | POA: Insufficient documentation

## 2018-11-26 DIAGNOSIS — Z7984 Long term (current) use of oral hypoglycemic drugs: Secondary | ICD-10-CM | POA: Insufficient documentation

## 2018-11-26 DIAGNOSIS — N736 Female pelvic peritoneal adhesions (postinfective): Secondary | ICD-10-CM | POA: Insufficient documentation

## 2018-11-26 DIAGNOSIS — C786 Secondary malignant neoplasm of retroperitoneum and peritoneum: Secondary | ICD-10-CM | POA: Insufficient documentation

## 2018-11-26 DIAGNOSIS — C7961 Secondary malignant neoplasm of right ovary: Secondary | ICD-10-CM | POA: Insufficient documentation

## 2018-11-26 DIAGNOSIS — J309 Allergic rhinitis, unspecified: Secondary | ICD-10-CM | POA: Insufficient documentation

## 2018-11-26 DIAGNOSIS — E785 Hyperlipidemia, unspecified: Secondary | ICD-10-CM | POA: Insufficient documentation

## 2018-11-26 DIAGNOSIS — C772 Secondary and unspecified malignant neoplasm of intra-abdominal lymph nodes: Secondary | ICD-10-CM | POA: Diagnosis not present

## 2018-11-26 DIAGNOSIS — C7989 Secondary malignant neoplasm of other specified sites: Secondary | ICD-10-CM | POA: Diagnosis not present

## 2018-11-26 DIAGNOSIS — C7880 Secondary malignant neoplasm of unspecified digestive organ: Secondary | ICD-10-CM | POA: Diagnosis not present

## 2018-11-26 HISTORY — PX: LAPAROSCOPIC APPENDECTOMY: SHX408

## 2018-11-26 LAB — GLUCOSE, CAPILLARY
Glucose-Capillary: 80 mg/dL (ref 70–99)
Glucose-Capillary: 128 mg/dL — ABNORMAL HIGH (ref 70–99)
Glucose-Capillary: 153 mg/dL — ABNORMAL HIGH (ref 70–99)

## 2018-11-26 LAB — ABO/RH: ABO/RH(D): A NEG

## 2018-11-26 SURGERY — XI ROBOTIC ASSISTED TOTAL HYSTERECTOMY BILATERAL SALPINGO OOPHORECTOMY WITH OMENTECTOMY AND DEBULKING
Anesthesia: General

## 2018-11-26 MED ORDER — ROCURONIUM BROMIDE 50 MG/5ML IV SOLN
INTRAVENOUS | Status: AC
  Filled 2018-11-26: qty 1

## 2018-11-26 MED ORDER — SODIUM CHLORIDE 0.9 % IV SOLN
INTRAVENOUS | Status: DC
  Administered 2018-11-26 (×2): via INTRAVENOUS

## 2018-11-26 MED ORDER — FENTANYL CITRATE (PF) 250 MCG/5ML IJ SOLN
INTRAMUSCULAR | Status: AC
  Filled 2018-11-26: qty 5

## 2018-11-26 MED ORDER — SCOPOLAMINE 1 MG/3DAYS TD PT72
1.0000 | MEDICATED_PATCH | TRANSDERMAL | Status: DC
  Administered 2018-11-26: 07:00:00 1.5 mg via TRANSDERMAL

## 2018-11-26 MED ORDER — ACETAMINOPHEN 325 MG PO TABS
650.0000 mg | ORAL_TABLET | ORAL | Status: DC | PRN
  Administered 2018-11-27: 650 mg via ORAL
  Filled 2018-11-26: qty 2

## 2018-11-26 MED ORDER — DEXAMETHASONE SODIUM PHOSPHATE 10 MG/ML IJ SOLN
INTRAMUSCULAR | Status: AC
  Administered 2018-11-26: 4 mg via INTRAVENOUS
  Filled 2018-11-26: qty 1

## 2018-11-26 MED ORDER — EPHEDRINE SULFATE 50 MG/ML IJ SOLN
INTRAMUSCULAR | Status: DC | PRN
  Administered 2018-11-26: 10 mg via INTRAVENOUS

## 2018-11-26 MED ORDER — IBUPROFEN 600 MG PO TABS: 600.0000 mg | ORAL_TABLET | Freq: Four times a day (QID) | ORAL | 1 refills | Status: DC

## 2018-11-26 MED ORDER — SODIUM CHLORIDE FLUSH 0.9 % IV SOLN
INTRAVENOUS | Status: AC
  Filled 2018-11-26: qty 10

## 2018-11-26 MED ORDER — FENTANYL CITRATE (PF) 100 MCG/2ML IJ SOLN
INTRAMUSCULAR | Status: AC
  Administered 2018-11-26: 25 ug via INTRAVENOUS
  Filled 2018-11-26: qty 2

## 2018-11-26 MED ORDER — HEPARIN SODIUM (PORCINE) 5000 UNIT/ML IJ SOLN
5000.0000 [IU] | INTRAMUSCULAR | Status: AC
  Administered 2018-11-26: 5000 [IU] via SUBCUTANEOUS

## 2018-11-26 MED ORDER — ONDANSETRON HCL 4 MG/2ML IJ SOLN
INTRAMUSCULAR | Status: AC
  Filled 2018-11-26: qty 2

## 2018-11-26 MED ORDER — GABAPENTIN 300 MG PO CAPS: 600.0000 mg | ORAL_CAPSULE | ORAL | Status: DC

## 2018-11-26 MED ORDER — FAMOTIDINE 20 MG PO TABS
20.0000 mg | ORAL_TABLET | Freq: Once | ORAL | Status: AC
  Administered 2018-11-26: 07:00:00 20 mg via ORAL

## 2018-11-26 MED ORDER — CIPROFLOXACIN IN D5W 400 MG/200ML IV SOLN
INTRAVENOUS | Status: AC
  Filled 2018-11-26: qty 200

## 2018-11-26 MED ORDER — FENTANYL CITRATE (PF) 100 MCG/2ML IJ SOLN
25.0000 ug | INTRAMUSCULAR | Status: DC | PRN
  Administered 2018-11-26 (×4): 25 ug via INTRAVENOUS

## 2018-11-26 MED ORDER — BUPIVACAINE HCL 0.5 % IJ SOLN
INTRAMUSCULAR | Status: DC | PRN
  Administered 2018-11-26: 11 mL

## 2018-11-26 MED ORDER — VASOPRESSIN 20 UNIT/ML IV SOLN
INTRAVENOUS | Status: AC
  Filled 2018-11-26: qty 1

## 2018-11-26 MED ORDER — PROPOFOL 10 MG/ML IV BOLUS
INTRAVENOUS | Status: DC | PRN
  Administered 2018-11-26: 80 mg via INTRAVENOUS

## 2018-11-26 MED ORDER — SODIUM CHLORIDE (PF) 0.9 % IJ SOLN
INTRAMUSCULAR | Status: AC
  Filled 2018-11-26: qty 20

## 2018-11-26 MED ORDER — LIDOCAINE HCL 4 % MT SOLN
OROMUCOSAL | Status: DC | PRN
  Administered 2018-11-26: 2 mL via TOPICAL

## 2018-11-26 MED ORDER — OXYCODONE HCL 5 MG PO TABS: 5.0000 mg | ORAL_TABLET | Freq: Three times a day (TID) | ORAL | 0 refills | Status: DC | PRN

## 2018-11-26 MED ORDER — OXYCODONE HCL 5 MG/5ML PO SOLN: 5.0000 mg | Freq: Once | ORAL | Status: AC | PRN

## 2018-11-26 MED ORDER — OXYCODONE HCL 5 MG PO TABS
5.0000 mg | ORAL_TABLET | Freq: Once | ORAL | Status: AC | PRN
  Administered 2018-11-26: 5 mg via ORAL

## 2018-11-26 MED ORDER — KETOROLAC TROMETHAMINE 15 MG/ML IJ SOLN
15.0000 mg | Freq: Four times a day (QID) | INTRAMUSCULAR | Status: DC
  Administered 2018-11-26 (×2): 15 mg via INTRAVENOUS
  Filled 2018-11-26 (×6): qty 1

## 2018-11-26 MED ORDER — ACETAMINOPHEN 500 MG PO TABS
ORAL_TABLET | ORAL | Status: AC
  Administered 2018-11-26: 1000 mg via ORAL
  Filled 2018-11-26: qty 2

## 2018-11-26 MED ORDER — DEXTROSE 50 % IV SOLN
12.5000 g | Freq: Once | INTRAVENOUS | Status: AC
  Administered 2018-11-26: 06:00:00 12.5 g via INTRAVENOUS

## 2018-11-26 MED ORDER — LEVOTHYROXINE SODIUM 100 MCG PO TABS
100.0000 ug | ORAL_TABLET | Freq: Every day | ORAL | Status: DC
  Administered 2018-11-26: 100 ug via ORAL
  Filled 2018-11-26 (×2): qty 1

## 2018-11-26 MED ORDER — DEXAMETHASONE SODIUM PHOSPHATE 10 MG/ML IJ SOLN
4.0000 mg | INTRAMUSCULAR | Status: AC
  Administered 2018-11-26: 07:00:00 4 mg via INTRAVENOUS

## 2018-11-26 MED ORDER — GABAPENTIN 300 MG PO CAPS: 300.0000 mg | ORAL_CAPSULE | Freq: Every day | ORAL | 0 refills | Status: DC

## 2018-11-26 MED ORDER — ONDANSETRON HCL 4 MG/2ML IJ SOLN
INTRAMUSCULAR | Status: DC | PRN
  Administered 2018-11-26: 4 mg via INTRAVENOUS

## 2018-11-26 MED ORDER — LACTATED RINGERS IV SOLN
INTRAVENOUS | Status: DC
  Administered 2018-11-26: via INTRAVENOUS

## 2018-11-26 MED ORDER — KETOROLAC TROMETHAMINE 30 MG/ML IJ SOLN
INTRAMUSCULAR | Status: AC
  Filled 2018-11-26: qty 1

## 2018-11-26 MED ORDER — MIDAZOLAM HCL 2 MG/2ML IJ SOLN
INTRAMUSCULAR | Status: DC | PRN
  Administered 2018-11-26 (×2): 1 mg via INTRAVENOUS

## 2018-11-26 MED ORDER — OXYCODONE HCL 5 MG PO TABS
ORAL_TABLET | ORAL | Status: AC
  Administered 2018-11-26: 15:00:00
  Filled 2018-11-26: qty 1

## 2018-11-26 MED ORDER — BUPIVACAINE HCL (PF) 0.5 % IJ SOLN
INTRAMUSCULAR | Status: AC
  Filled 2018-11-26: qty 30

## 2018-11-26 MED ORDER — CELECOXIB 200 MG PO CAPS
ORAL_CAPSULE | ORAL | Status: AC
  Administered 2018-11-26: 400 mg via ORAL
  Filled 2018-11-26: qty 2

## 2018-11-26 MED ORDER — HEPARIN SODIUM (PORCINE) 5000 UNIT/ML IJ SOLN
INTRAMUSCULAR | Status: AC
  Filled 2018-11-26: qty 1

## 2018-11-26 MED ORDER — ACETAMINOPHEN 500 MG PO TABS
1000.0000 mg | ORAL_TABLET | ORAL | Status: AC
  Administered 2018-11-26: 07:00:00 1000 mg via ORAL

## 2018-11-26 MED ORDER — SODIUM CHLORIDE 0.9 % IV SOLN
INTRAVENOUS | Status: DC | PRN
  Administered 2018-11-26: 08:00:00 30 ug/min via INTRAVENOUS

## 2018-11-26 MED ORDER — DEXAMETHASONE SODIUM PHOSPHATE 10 MG/ML IJ SOLN
INTRAMUSCULAR | Status: DC | PRN
  Administered 2018-11-26: 4 mg via INTRAVENOUS

## 2018-11-26 MED ORDER — GABAPENTIN 300 MG PO CAPS
ORAL_CAPSULE | ORAL | Status: AC
  Filled 2018-11-26: qty 2

## 2018-11-26 MED ORDER — PROPOFOL 10 MG/ML IV BOLUS
INTRAVENOUS | Status: AC
  Filled 2018-11-26: qty 20

## 2018-11-26 MED ORDER — LIDOCAINE HCL (PF) 2 % IJ SOLN
INTRAMUSCULAR | Status: AC
  Filled 2018-11-26: qty 10

## 2018-11-26 MED ORDER — LIDOCAINE HCL (CARDIAC) PF 100 MG/5ML IV SOSY
PREFILLED_SYRINGE | INTRAVENOUS | Status: DC | PRN
  Administered 2018-11-26: 40 mg via INTRATRACHEAL

## 2018-11-26 MED ORDER — DEXTROSE 50 % IV SOLN
INTRAVENOUS | Status: AC
  Administered 2018-11-26: 12.5 g via INTRAVENOUS
  Filled 2018-11-26: qty 50

## 2018-11-26 MED ORDER — GLIPIZIDE 5 MG PO TABS
5.0000 mg | ORAL_TABLET | Freq: Every day | ORAL | Status: DC
  Administered 2018-11-27: 5 mg via ORAL
  Filled 2018-11-26: qty 1

## 2018-11-26 MED ORDER — SODIUM CHLORIDE 0.9 % IV SOLN
INTRAVENOUS | Status: DC | PRN
  Administered 2018-11-26: 08:00:00 via INTRAVENOUS

## 2018-11-26 MED ORDER — SOD CITRATE-CITRIC ACID 500-334 MG/5ML PO SOLN
30.0000 mL | ORAL | Status: DC
  Filled 2018-11-26: qty 30

## 2018-11-26 MED ORDER — DEXAMETHASONE SODIUM PHOSPHATE 10 MG/ML IJ SOLN
INTRAMUSCULAR | Status: AC
  Filled 2018-11-26: qty 1

## 2018-11-26 MED ORDER — SUGAMMADEX SODIUM 200 MG/2ML IV SOLN
INTRAVENOUS | Status: DC | PRN
  Administered 2018-11-26: 200 mg via INTRAVENOUS

## 2018-11-26 MED ORDER — ACETAMINOPHEN 650 MG RE SUPP: 650.0000 mg | RECTAL | Status: DC | PRN

## 2018-11-26 MED ORDER — SUGAMMADEX SODIUM 200 MG/2ML IV SOLN
INTRAVENOUS | Status: AC
  Filled 2018-11-26: qty 2

## 2018-11-26 MED ORDER — MIDAZOLAM HCL 2 MG/2ML IJ SOLN
INTRAMUSCULAR | Status: AC
  Filled 2018-11-26: qty 2

## 2018-11-26 MED ORDER — CELECOXIB 200 MG PO CAPS
400.0000 mg | ORAL_CAPSULE | ORAL | Status: AC
  Administered 2018-11-26: 07:00:00 400 mg via ORAL

## 2018-11-26 MED ORDER — METFORMIN HCL 500 MG PO TABS
1000.0000 mg | ORAL_TABLET | Freq: Two times a day (BID) | ORAL | Status: DC
  Administered 2018-11-26 – 2018-11-27 (×2): 1000 mg via ORAL
  Filled 2018-11-26 (×3): qty 2

## 2018-11-26 MED ORDER — FAMOTIDINE 20 MG PO TABS
ORAL_TABLET | ORAL | Status: AC
  Administered 2018-11-26: 20 mg via ORAL
  Filled 2018-11-26: qty 1

## 2018-11-26 MED ORDER — SCOPOLAMINE 1 MG/3DAYS TD PT72
MEDICATED_PATCH | TRANSDERMAL | Status: AC
  Administered 2018-11-26: 1.5 mg via TRANSDERMAL
  Filled 2018-11-26: qty 1

## 2018-11-26 MED ORDER — FENTANYL CITRATE (PF) 250 MCG/5ML IJ SOLN
INTRAMUSCULAR | Status: DC | PRN
  Administered 2018-11-26 (×3): 50 ug via INTRAVENOUS
  Administered 2018-11-26: 25 ug via INTRAVENOUS
  Administered 2018-11-26: 75 ug via INTRAVENOUS

## 2018-11-26 MED ORDER — ROCURONIUM BROMIDE 100 MG/10ML IV SOLN
INTRAVENOUS | Status: DC | PRN
  Administered 2018-11-26: 30 mg via INTRAVENOUS
  Administered 2018-11-26: 40 mg via INTRAVENOUS
  Administered 2018-11-26: 20 mg via INTRAVENOUS
  Administered 2018-11-26: 30 mg via INTRAVENOUS

## 2018-11-26 SURGICAL SUPPLY — 85 items
ANCHOR TIS RET SYS 1550ML (BAG) IMPLANT
ANCHOR TIS RET SYS 235ML (MISCELLANEOUS) ×4 IMPLANT
BAG URINE DRAINAGE (UROLOGICAL SUPPLIES) ×4 IMPLANT
BLADE SURG SZ11 CARB STEEL (BLADE) ×4 IMPLANT
CANISTER SUCT 1200ML W/VALVE (MISCELLANEOUS) ×4 IMPLANT
CANNULA SEALS 8.5MM (CANNULA) ×6
CATH FOLEY 2WAY  5CC 16FR (CATHETERS) ×2
CATH FOLEY SIL 2WAY 14FR5CC (CATHETERS) ×4 IMPLANT
CATH URTH 16FR FL 2W BLN LF (CATHETERS) ×2 IMPLANT
CHLORAPREP W/TINT 26 (MISCELLANEOUS) ×4 IMPLANT
CORD BIP STRL DISP 12FT (MISCELLANEOUS) ×4 IMPLANT
CORD MONOPOLAR M/FML 12FT (MISCELLANEOUS) ×4 IMPLANT
COVER TIP SHEARS 8 DVNC (MISCELLANEOUS) ×2 IMPLANT
COVER TIP SHEARS 8MM DA VINCI (MISCELLANEOUS) ×2
COVER WAND RF STERILE (DRAPES) ×4 IMPLANT
CUTTER FLEX LINEAR 45M (STAPLE) ×4 IMPLANT
DEFOGGER SCOPE WARMER CLEARIFY (MISCELLANEOUS) ×4 IMPLANT
DERMABOND ADVANCED (GAUZE/BANDAGES/DRESSINGS) ×2
DERMABOND ADVANCED .7 DNX12 (GAUZE/BANDAGES/DRESSINGS) ×2 IMPLANT
DRAPE 3/4 80X56 (DRAPES) ×8 IMPLANT
DRAPE ARM DVNC X/XI (DISPOSABLE) ×8 IMPLANT
DRAPE COLUMN DVNC XI (DISPOSABLE) ×2 IMPLANT
DRAPE DA VINCI XI ARM (DISPOSABLE) ×8
DRAPE DA VINCI XI COLUMN (DISPOSABLE) ×2
DRAPE LEGGINS SURG 28X43 STRL (DRAPES) ×4 IMPLANT
DRESSING SURGICEL FIBRLLR 1X2 (HEMOSTASIS) ×2 IMPLANT
DRSG SURGICEL FIBRILLAR 1X2 (HEMOSTASIS) ×4
ELECT REM PT RETURN 9FT ADLT (ELECTROSURGICAL) ×8
ELECTRODE REM PT RTRN 9FT ADLT (ELECTROSURGICAL) ×4 IMPLANT
GLOVE BIO SURGEON STRL SZ 6.5 (GLOVE) ×21 IMPLANT
GLOVE BIO SURGEONS STRL SZ 6.5 (GLOVE) ×7
GLOVE INDICATOR 7.0 STRL GRN (GLOVE) ×28 IMPLANT
GOWN STRL REUS W/ TWL LRG LVL3 (GOWN DISPOSABLE) ×12 IMPLANT
GOWN STRL REUS W/TWL LRG LVL3 (GOWN DISPOSABLE) ×12
GRASPER SUT TROCAR 14GX15 (MISCELLANEOUS) ×4 IMPLANT
HANDLE YANKAUER SUCT BULB TIP (MISCELLANEOUS) ×4 IMPLANT
IRRIGATION STRYKERFLOW (MISCELLANEOUS) ×2 IMPLANT
IRRIGATOR STRYKERFLOW (MISCELLANEOUS) ×4
IV NS 1000ML (IV SOLUTION)
IV NS 1000ML BAXH (IV SOLUTION) IMPLANT
KIT IMAGING PINPOINTPAQ (MISCELLANEOUS) IMPLANT
KIT PINK PAD W/HEAD ARE REST (MISCELLANEOUS) ×4
KIT PINK PAD W/HEAD ARM REST (MISCELLANEOUS) ×2 IMPLANT
KIT TURNOVER CYSTO (KITS) ×4 IMPLANT
L-HOOK LAP DISP 36CM (ELECTROSURGICAL) ×4
LABEL OR SOLS (LABEL) ×4 IMPLANT
LHOOK LAP DISP 36CM (ELECTROSURGICAL) ×2 IMPLANT
MANIPULATOR VCARE LG CRV RETR (MISCELLANEOUS) IMPLANT
MANIPULATOR VCARE SML CRV RETR (MISCELLANEOUS) IMPLANT
MANIPULATOR VCARE STD CRV RETR (MISCELLANEOUS) IMPLANT
NDL INSUFF ACCESS 14 VERSASTEP (NEEDLE) ×4 IMPLANT
NEEDLE HYPO 22GX1.5 SAFETY (NEEDLE) ×4 IMPLANT
NEEDLE SPNL 22GX3.5 QUINCKE BK (NEEDLE) ×4 IMPLANT
NEEDLE SPNL 22GX5 LNG QUINC BK (NEEDLE) ×4 IMPLANT
NS IRRIG 1000ML POUR BTL (IV SOLUTION) IMPLANT
NS IRRIG 500ML POUR BTL (IV SOLUTION) ×4 IMPLANT
OBTURATOR OPTICAL STANDARD 8MM (TROCAR) ×2
OBTURATOR OPTICAL STND 8 DVNC (TROCAR) ×2
OBTURATOR OPTICALSTD 8 DVNC (TROCAR) ×2 IMPLANT
OCCLUDER COLPOPNEUMO (BALLOONS) ×4 IMPLANT
PACK GYN LAPAROSCOPIC (MISCELLANEOUS) ×4 IMPLANT
PAD OB MATERNITY 4.3X12.25 (PERSONAL CARE ITEMS) ×4 IMPLANT
PAD PREP 24X41 OB/GYN DISP (PERSONAL CARE ITEMS) ×4 IMPLANT
PENCIL ELECTRO HAND CTR (MISCELLANEOUS) ×4 IMPLANT
PORT ACCESS TROCAR AIRSEAL 12 (TROCAR) IMPLANT
PORT ACCESS TROCAR AIRSEAL 5 (TROCAR) ×4 IMPLANT
PORT ACCESS TROCAR AIRSEAL 5M (TROCAR)
RELOAD STAPLE TA45 3.5 REG BLU (ENDOMECHANICALS) ×4 IMPLANT
SEAL CANN 8.5 DVNC (CANNULA) ×6 IMPLANT
SET CYSTO W/LG BORE CLAMP LF (SET/KITS/TRAYS/PACK) IMPLANT
SET TRI-LUMEN FLTR TB AIRSEAL (TUBING) ×4 IMPLANT
SLEEVE VERSASTEP EXPAND ONEST (MISCELLANEOUS) ×4 IMPLANT
SOLUTION ELECTROLUBE (MISCELLANEOUS) ×4 IMPLANT
SUT DVC VLOC 180 0 12IN GS21 (SUTURE) ×4
SUT MNCRL 4-0 (SUTURE) ×4
SUT MNCRL 4-0 27XMFL (SUTURE) ×4
SUT VIC AB 1 CT1 36 (SUTURE) ×8 IMPLANT
SUT VICRYL 0 AB UR-6 (SUTURE) ×8 IMPLANT
SUTURE DVC VLC 180 0 12IN GS21 (SUTURE) ×2 IMPLANT
SUTURE MNCRL 4-0 27XMF (SUTURE) ×4 IMPLANT
SYR 3ML LL SCALE MARK (SYRINGE) ×8 IMPLANT
SYR 50ML LL SCALE MARK (SYRINGE) ×8 IMPLANT
TROCAR BALLN GELPORT 12X130M (ENDOMECHANICALS) IMPLANT
TROCAR SL VERSASTEP 5M LG  B (MISCELLANEOUS) ×2
TROCAR SL VERSASTEP 5M LG B (MISCELLANEOUS) ×2 IMPLANT

## 2018-11-26 NOTE — Interval H&P Note (Signed)
History and Physical Interval Note:  11/26/2018 7:21 AM  Meredith Jones  has presented today for surgery, with the diagnosis of Serous Adenocarcinoma.  The various methods of treatment have been discussed with the patient and family. After consideration of risks, benefits and other options for treatment, the patient has consented to  Procedure(s): XI ROBOTIC ASSISTED TOTAL HYSTERECTOMY BILATERAL SALPINGO OOPHORECTOMY WITH OMENTECTOMY AND DEBULKING (N/A) EXPLORATORY LAPAROTOMY, Possible Ostomy and Bowel Resection (N/A) as a surgical intervention.  The patient's history has been reviewed, patient examined, no change in status, stable for surgery.  I have reviewed the patient's chart and labs.  Questions were answered to the patient's satisfaction.     [place]

## 2018-11-26 NOTE — Anesthesia Post-op Follow-up Note (Signed)
Anesthesia QCDR form completed.        

## 2018-11-26 NOTE — Progress Notes (Signed)
Pt transferred for overnight observation due to patient not feeling comfortable going home alone feeling so weak after attempting to ambulate after procedure. Pt VSS, pain controlled by medications, moderate vaginal bleeding noted on peri pad on admission, lap sites WDL x4, A&O x4, no N/V- scop patch in place. Will continue to monitor.

## 2018-11-26 NOTE — Progress Notes (Signed)
Pt. Arrived in post op area , nurse asissted her up to bathroom , pt, states she is feeling unsteady , and unsure if she is safe to go home , states she is also unsure if anyone can stay with her overnight . Dr. Leonides Schanz paged orders entered for overnight stay .

## 2018-11-26 NOTE — Op Note (Addendum)
Operative Note   11/26/2018  TIME 10:43 AM  PRE-OP DIAGNOSIS: Primary high grade serous adenocarcinoma of the ovary s/p neoadjuvant chemotherapy.    POST-OP DIAGNOSIS: Primary high grade serous adenocarcinoma of the ovary s/p neoadjuvant chemotherapy, optimally debulked. Extensive pelvic adhesive disease. Appendix involved with tumor.   SURGEON: Surgeon(s) and Role: Angeles Gaetana Michaelis, MD - Assisting Larey Days, MD - Primary  ANESTHESIA: General  PROCEDURE: Procedure(s):Robotic assisted total laparoscopic hysterectomy and bilateral salpingo-oophorectomy, appendectomy, extensive lysis of adhesions including enterolysis and ovariolysis (>45 minutes), and R1 optimal debulking. Repair of vaginal laceration.   ESTIMATED BLOOD LOSS: <100 cc  DRAINS: Foley  TOTAL IV FLUIDS: per anesthesia  UOP: per anesthesia  SPECIMENS:  Uterus, cervix, and bilateral fallopian tubes and ovaries Appendix  COMPLICATIONS: None apparent  DISPOSITION: PACU  CONDITION: Stable  INDICATIONS: 63yo G0 with high grade serous carcinoma of gyn origin, s/p 3 cycles of neo-adjuvant chemotherapy, here for interval debulking surgery.  She has shown good response via imaging and her CA 125 has decreased to normal levels from 491-31  FINDINGS: Exam under anesthesia revealed a small mobile regular uterus. There were no adnexal masses or nodularity. The parametria was smooth. The cervix was negative for gross lesions. The vaginal introitus was very narrow and a speculum could not be placed. Intrauterine manipulator made out of a sound and an alis clamp at 12:00.  Intraoperative findings included: The normal appearing small uterus. The left adnexa was enlarged approximately 6 cm with a cystic structure containing clear fluid; normal left tube. The left adnexa was adherent to the posterior uterus, left sidewall, and rectosigmoid. The right ovary was adherent to the posterior uterus and right side wall; the right  tube was very tortuous and adherent to the ovary but otherwise normal. The appendix was plastered to the right infundibular pelvic ligament . The upper abdomen was normal including bowel, liver, stomach, and diaphragmatic surfaces, isolated staples seen on stomach and transverse colon.  There was no evidence of grossly enlarged pelvic or right para-aortic lymph nodes. There was minimal omentum present and it did not appear to be grossly involved with tumor. There was miliary disease 1-5 mm, including a small nodule on the transverse colon, scattered throughout the peritoneal and mesenteric surfaces.   PROCEDURE IN DETAIL: After informed consent was obtained, the patient was taken to the operating room where anesthesia was obtained without difficulty. The patient was positioned in the dorsal lithotomy position in [address] and her arms were carefully tucked at her sides and the usual precautions were taken.  She was prepped and draped in normal sterile fashion.  Time-out was performed.The Foley catheter was placed. The exam was performed with above noted findings.  A standard VCare uterine manipulator could not be placed, Therefore a sound and EEA sizer were used to manipulate the uterus and delineate the anterior vaginal wall.   Operative entry was obtained via a supraumbilical incision and direct entry. The robotic port was placed, abdomen insuffulated, and pelvis visualized with noted findings above.  The patient was placed in Trendelenburg and the bowel was displaced up into the upper abdomen.  The robotic ports and LUQ port placement, and robotic docking was performed. The adhesions involving the appendix to the right IP ligament were lysed. Round ligaments were divided on each side and the retroperitoneal space was opened bilaterally. The infundibulopelvic ligaments were skeletonized, and the ureters were identified and preserved. On the left this was more challenging due to the adhesions to the  rectosigmoid. The IP ligaments were sealed and divided.  A bladder flap was created and the bladder was dissected down off the lower uterine segment and cervix. The uterine vessels were skeletonized, ligated and transected.  A colpotomy was performed circumferentially along the EEA sizer with electrocautery and the cervix was incised from the vagina.  The LLQ 8 mm robotic port was exchanged for a 12 mm robotic port in order to perform the appendectomy. The mesoappendix was skeletonized and the appendiceal blood supply was isolated, ligated, and transected. The appendectomy was performed with the endoscopic stapler. The specimen was placed in a laparoscopic bag. There were two rows of staples noted across the appendiceal stump.   The EEA sizer and pneumoballoon were removed. The gynecologic specimen and the appendix within the bag were brought through the vagina.  A pneumo balloon was replaced in the vagina and the vaginal cuff was then closed in a running continuous fashion using 0 V-Lock suture with careful attention to include the vaginal cuff angles and the vaginal mucosa within the closure.    Hemostasis was observed. The intraperitoneal pressure was dropped, and all planes of dissection, vascular pedicles and the vaginal cuff were found to be hemostatic.  The 27mm trocar was removed and the fascia closed using Carter-Thomason device. The remaining skin incisions were closed with subcuticular stitch and covered with surgical glue.    Evaluation of the vagina showed a laceration of the right vaginal wall that was briskly bleeding.  Visualization was difficult due to narrow introitus.  The laceration was identified and closed with 0-vicryl in a running stitch and hemostasis was observed.  The patient tolerated the procedure well.  Sponge, lap and needle counts were correct x2.  The patient was taken to recovery room in excellent condition.  Antibiotics: Ciprofloxin and Flagyl given preoperatively.     VTE prophylaxis: was ordered perioperatively : Heparin 5000u sq, SCDs    Dr. Theora Gianotti assisted me with the procedure which could not be peformed without her assistance.  I was present for the procedure in its entirety   ----- Larey Days, MD, [place] Attending Obstetrician and Gynecologist [facility], Department of [facility]

## 2018-11-26 NOTE — Transfer of Care (Signed)
Immediate Anesthesia Transfer of Care Note  [name]  Procedure(s) Performed: XI ROBOTIC ASSISTED TOTAL HYSTERECTOMY BILATERAL SALPINGO OOPHORECTOMY (N/A ) APPENDECTOMY LAPAROSCOPIC  Patient Location: PACU  Anesthesia Type:General  Level of Consciousness: sedated  Airway & Oxygen Therapy: Patient Spontanous Breathing and Patient connected to face mask oxygen  Post-op Assessment: Report given to RN and Post -op Vital signs reviewed and stable  Post vital signs: Reviewed and stable  Last Vitals:  Vitals Value Taken Time  BP    Temp    Pulse 61 11/26/18 1213  Resp 11 11/26/18 1213  SpO2 100 % 11/26/18 1213  Vitals shown include unvalidated device data.  Last Pain:  Vitals:   11/26/18 0610  TempSrc: Tympanic  PainSc: 0-No pain         Complications: No apparent anesthesia complications

## 2018-11-26 NOTE — Anesthesia Procedure Notes (Signed)
Procedure Name: Intubation Date/Time: 11/26/2018 7:46 AM Performed by: Eben Burow, CRNA Pre-anesthesia Checklist: Patient identified, Emergency Drugs available, Suction available and Patient being monitored Patient Re-evaluated:Patient Re-evaluated prior to induction Oxygen Delivery Method: Circle system utilized Preoxygenation: Pre-oxygenation with 100% oxygen Induction Type: IV induction Ventilation: Mask ventilation without difficulty Laryngoscope Size: Mac and 3 Grade View: Grade I Tube type: Oral Tube size: 7.0 mm Number of attempts: 1 Airway Equipment and Method: LTA kit utilized Placement Confirmation: ETT inserted through vocal cords under direct vision,  positive ETCO2 and breath sounds checked- equal and bilateral Secured at: 22 cm Tube secured with: Tape Dental Injury: Teeth and Oropharynx as per pre-operative assessment

## 2018-11-26 NOTE — Interval H&P Note (Signed)
History and Physical Interval Note:  11/26/2018 7:28 AM  Meredith Jones  has presented today for surgery, with the diagnosis of Serous Adenocarcinoma.  The various methods of treatment have been discussed with the patient and family. After consideration of risks, benefits and other options for treatment, the patient has consented to  Procedure(s): XI ROBOTIC ASSISTED TOTAL HYSTERECTOMY BILATERAL SALPINGO OOPHORECTOMY WITH OMENTECTOMY AND DEBULKING (N/A) EXPLORATORY LAPAROTOMY, Possible Ostomy and Bowel Resection (N/A) as a surgical intervention.  The patient's history has been reviewed, patient examined, no change in status, stable for surgery.  I have reviewed the patient's chart and labs.  Questions were answered to the patient's satisfaction.     [place]

## 2018-11-26 NOTE — Anesthesia Postprocedure Evaluation (Signed)
Anesthesia Post Note  [name]  Procedure(s) Performed: XI ROBOTIC ASSISTED TOTAL HYSTERECTOMY BILATERAL SALPINGO OOPHORECTOMY (N/A ) APPENDECTOMY LAPAROSCOPIC  Patient location during evaluation: PACU Anesthesia Type: General Level of consciousness: awake and alert and oriented Pain management: pain level controlled Vital Signs Assessment: post-procedure vital signs reviewed and stable Respiratory status: spontaneous breathing, nonlabored ventilation and respiratory function stable Cardiovascular status: blood pressure returned to baseline and stable Postop Assessment: no signs of nausea or vomiting Anesthetic complications: no     Last Vitals:  Vitals:   11/26/18 1422 11/26/18 1444  BP: 125/70 119/67  Pulse: 63   Resp:    Temp: (!) 36.1 C   SpO2: 100% 100%    Last Pain:  Vitals:   11/26/18 1444  TempSrc:   PainSc: Asleep                 Minoru Chap

## 2018-11-26 NOTE — Anesthesia Preprocedure Evaluation (Signed)
Anesthesia Evaluation  Patient identified by MRN, date of birth, ID band Patient awake    Reviewed: Allergy & Precautions, H&P , NPO status , Patient's Chart, lab work & pertinent test results  History of Anesthesia Complications (+) PONV, Family history of anesthesia reaction and history of anesthetic complications  Airway Mallampati: II  TM Distance: >3 FB Neck ROM: full    Dental  (+) Chipped   Pulmonary neg pulmonary ROS, neg shortness of breath,           Cardiovascular Exercise Tolerance: Good (-) angina(-) Past MI and (-) DOE negative cardio ROS       Neuro/Psych negative neurological ROS  negative psych ROS   GI/Hepatic Neg liver ROS, GERD  Medicated and Controlled,  Endo/Other  diabetes, Type 2Hypothyroidism   Renal/GU      Musculoskeletal   Abdominal   Peds  Hematology negative hematology ROS (+)   Anesthesia Other Findings Past Medical History: No date: Allergy No date: Anemia No date: Complication of anesthesia No date: Diabetes mellitus without complication (HCC) No date: Diverticulosis No date: Family history of adverse reaction to anesthesia     Comment:  mom-n/v No date: Family history of lung cancer No date: GERD (gastroesophageal reflux disease)     Comment:  h/o No date: Hyperlipidemia No date: Hypothyroidism 08/04/2018: Ovarian cancer ([place])     Comment:  Chemo tx's. No date: PONV (postoperative nausea and vomiting)     Comment:  sick with tonsillectomy No date: Thyroid disease  Past Surgical History: 2007: BACK SURGERY     Comment:  Lumbar Surgery No date: BREAST CYST ASPIRATION; Right     Comment:  Around 10 years ago. Pt thinks it was the right breast               but not sure. 2010: CATARACT EXTRACTION; Left     Comment:  Right Eye 2011 09/02/2015: COLONOSCOPY WITH PROPOFOL; N/A     Comment:  Procedure: COLONOSCOPY WITH PROPOFOL;  Surgeon: [name], MD;   Location: [place];  Service:               Endoscopy;  Laterality: N/A;  Diabetic - oral meds 11/03/2015: ESOPHAGOGASTRODUODENOSCOPY (EGD) WITH PROPOFOL; N/A     Comment:  Procedure: ESOPHAGOGASTRODUODENOSCOPY (EGD) WITH               PROPOFOL;  Surgeon: [name], MD;  Location: [place];  Service: Endoscopy;  Laterality: N/A; 06/2014: EYE SURGERY; Bilateral     Comment:   tear duck catherization ( Dr. Vallarie Mare) 09/02/2015: POLYPECTOMY     Comment:  Procedure: POLYPECTOMY;  Surgeon: [name], MD;                Location: [place];  Service: Endoscopy;; 1963: TONSILLECTOMY     Reproductive/Obstetrics negative OB ROS                             Anesthesia Physical Anesthesia Plan  ASA: III  Anesthesia Plan: General ETT   Post-op Pain Management:    Induction: Intravenous  PONV Risk Score and Plan: Ondansetron, Dexamethasone, Midazolam and Treatment may vary due to age or medical condition  Airway Management Planned: Oral ETT  Additional Equipment:   Intra-op Plan:  Post-operative Plan: Extubation in OR  Informed Consent: I have reviewed the patients History and Physical, chart, labs and discussed the procedure including the risks, benefits and alternatives for the proposed anesthesia with the patient or authorized representative who has indicated his/her understanding and acceptance.     Dental Advisory Given  Plan Discussed with: Anesthesiologist, CRNA and Surgeon  Anesthesia Plan Comments: (Patient consented for risks of anesthesia including but not limited to:  - adverse reactions to medications - damage to teeth, lips or other oral mucosa - sore throat or hoarseness - Damage to heart, brain, lungs or loss of life  Patient voiced understanding.)        Anesthesia Quick Evaluation

## 2018-11-26 NOTE — OR Nursing (Signed)
Consent verified with Dr Theora Gianotti this am.

## 2018-11-26 NOTE — OR Nursing (Signed)
Dr Rosey Bath notified of [facility] of 80 orders received.

## 2018-11-27 ENCOUNTER — Encounter: Payer: Self-pay | Admitting: Obstetrics & Gynecology

## 2018-11-27 DIAGNOSIS — C562 Malignant neoplasm of left ovary: Secondary | ICD-10-CM | POA: Diagnosis not present

## 2018-11-27 LAB — BASIC METABOLIC PANEL
Anion gap: 7 (ref 5–15)
BUN: 7 mg/dL — ABNORMAL LOW (ref 8–23)
CO2: 24 mmol/L (ref 22–32)
Calcium: 8.3 mg/dL — ABNORMAL LOW (ref 8.9–10.3)
Chloride: 99 mmol/L (ref 98–111)
Creatinine, Ser: 0.57 mg/dL (ref 0.44–1.00)
GFR calc Af Amer: >60 mL/min (ref >60)
GFR calc non Af Amer: >60 mL/min (ref >60)
Glucose, Bld: 136 mg/dL — ABNORMAL HIGH (ref 70–99)
Potassium: 3.8 mmol/L (ref 3.5–5.1)
Sodium: 130 mmol/L — ABNORMAL LOW (ref 135–145)

## 2018-11-27 LAB — CBC
HCT: 24.5 % — ABNORMAL LOW (ref 36.0–46.0)
Hemoglobin: 8.3 g/dL — ABNORMAL LOW (ref 12.0–15.0)
MCH: 37.2 pg — ABNORMAL HIGH (ref 26.0–34.0)
MCHC: 33.9 g/dL (ref 30.0–36.0)
MCV: 109.9 fL — ABNORMAL HIGH (ref 80.0–100.0)
Platelets: 139 10*3/uL — ABNORMAL LOW (ref 150–400)
RBC: 2.23 MIL/uL — ABNORMAL LOW (ref 3.87–5.11)
RDW: 17.5 % — ABNORMAL HIGH (ref 11.5–15.5)
WBC: 7.3 10*3/uL (ref 4.0–10.5)
nRBC: 0 % (ref 0.0–0.2)

## 2018-11-27 MED ORDER — ACETAMINOPHEN 500 MG PO TABS: 1000.0000 mg | ORAL_TABLET | Freq: Four times a day (QID) | ORAL | Status: DC

## 2018-11-27 NOTE — Discharge Summary (Signed)
Meredith Jones  Patient ID: [name] MRN: [id number] DOB/AGE: 07/22/1955 63 y.o.  Admit Date: 11/26/2018 Discharge Date: 11/27/2018  Preoperative Diagnoses: advanced high-grade cancer of GYN origin Postoperative Diagnoses: same   Procedures: Procedure(s) (LRB): XI ROBOTIC ASSISTED TOTAL HYSTERECTOMY BILATERAL SALPINGO OOPHORECTOMY (N/A) APPENDECTOMY LAPAROSCOPIC  CBC Latest Ref Rng & Units 11/27/2018 11/21/2018 11/21/2018  WBC 4.0 - 10.5 K/uL 7.3 6.2 6.3  Hemoglobin 12.0 - 15.0 g/dL 8.3(L) 10.1(L) 9.9(L)  Hematocrit 36.0 - 46.0 % 24.5(L) 30.9(L) 30.5(L)  Platelets 150 - 400 K/uL 139(L) 191 204    Hospital Course:  Meredith Jones is a 63 y.o.  admitted for scheduled surgery.  She underwent the procedures as mentioned above, her operation was uncomplicated. For further details about surgery, please refer to the operative report. Patient had an uncomplicated postoperative course. By time of discharge on POD#1, her pain was controlled on oral pain medications; she was ambulating, voiding without difficulty, tolerating regular diet and passing flatus. She was deemed stable for discharge to home.    Discharge Exam: BP 121/67 (BP Location: Right Arm)   Pulse 68   Temp 98.3 F (36.8 C)   Resp 18   SpO2 100%  General appearance: alert and no distress  Resp: clear to auscultation bilaterally, normal respiratory effort Cardio: regular rate and rhythm  GI: soft, non-tender; bowel sounds normal; no masses, no organomegaly.  Incision: C/D/I, no erythema, no drainage noted Pelvic: scant blood on pad  Extremities: extremities normal, atraumatic, no cyanosis or edema and Homans sign is negative, no sign of DVT  Discharged Condition: Stable  Disposition: Discharge disposition: 01-Home or Self Care     to home   Allergies as of 11/27/2018      Reactions   Clarithromycin Nausea Only   Doxycycline Nausea Only   dizziness   Penicillins Hives   Did it involve  swelling of the face/tongue/throat, SOB, or low BP? No Did it involve sudden or severe rash/hives, skin peeling, or any reaction on the inside of your mouth or nose? No Did you need to seek medical attention at a hospital or doctor's office? No When did it last happen?50+ years ago If all above answers are "NO", may proceed with cephalosporin use.      Medication List    TAKE these medications   acetaminophen 500 MG tablet Commonly known as: TYLENOL Take 2 tablets (1,000 mg total) by mouth every 6 (six) hours.   aspirin 81 MG tablet Take 81 mg by mouth daily.   calcipotriene 0.005 % ointment Commonly known as: DOVONOX Apply 1 application topically as needed. CALCIPOTRIENE, 0.005% (External Ointment) - Historical Medication  apply to skin daily (0.005 %) Active   dexamethasone 4 MG tablet Commonly known as: DECADRON Take 2 tablets (8 mg total) by mouth daily. Start the day after carboplatin chemotherapy for 3 days.   fexofenadine 60 MG tablet Commonly known as: ALLEGRA Take 60 mg by mouth daily as needed for allergies.   furosemide 20 MG tablet Commonly known as: Lasix Take 1 tablet (20 mg total) by mouth daily.   gabapentin 300 MG capsule Commonly known as: Neurontin Take 1 capsule (300 mg total) by mouth at bedtime.   glipiZIDE 5 MG tablet Commonly known as: GLUCOTROL TAKE 1 TABLET BY MOUTH DAILY BEFORE BREAKFAST What changed:   how much to take  how to take this  when to take this  additional instructions   glucose blood test strip Commonly known as: ONE TOUCH  ULTRA TEST USE TO TEST BLOOD SUGAR TWICE A DAY AS INSTRUCTED   ibuprofen 600 MG tablet Commonly known as: ADVIL Take 1 tablet (600 mg total) by mouth every 6 (six) hours.   levothyroxine 100 MCG tablet Commonly known as: Synthroid Take 1 tablet (100 mcg total) by mouth daily. What changed: when to take this   lidocaine-prilocaine cream Commonly known as: EMLA Apply to affected area once    LORazepam 0.5 MG tablet Commonly known as: Ativan Take 1 tablet (0.5 mg total) by mouth every 6 (six) hours as needed (Nausea or vomiting).   metFORMIN 500 MG tablet Commonly known as: GLUCOPHAGE Take 2 tablets (1,000 mg total) by mouth 2 (two) times daily.   MULTIPLE VITAMIN PO Take 1 tablet by mouth daily.   ondansetron 8 MG tablet Commonly known as: Zofran Take 1 tablet (8 mg total) by mouth 2 (two) times daily as needed for refractory nausea / vomiting. Start on day 3 after carboplatin chemo.   OneTouch Delica Lancets [id number] Misc USE TO TEST BLOOD SUGAR ONCE A DAY   oxyCODONE 5 MG immediate release tablet Commonly known as: Roxicodone Take 1 tablet (5 mg total) by mouth every 8 (eight) hours as needed.   prochlorperazine 10 MG tablet Commonly known as: COMPAZINE Take 1 tablet (10 mg total) by mouth every 6 (six) hours as needed (Nausea or vomiting).   simvastatin 20 MG tablet Commonly known as: ZOCOR Take 1 tablet (20 mg total) by mouth at bedtime.   spironolactone 50 MG tablet Commonly known as: ALDACTONE Take 1 tablet (50 mg total) by mouth daily.      Follow-up Information    Meredith Jones, Honor Loh, MD Follow up in 2 week(s).   Specialty: Obstetrics and Meredith Why: Thursday 12/11/2018 @ 11:45. Contact information: [place] [address] [phone]        Meredith Ends, MD Follow up in 6 week(s).   Specialties: Obstetrics, Gynecologic Oncology Contact information: [facility] [place] [address] [phone]           Signed:  [place] Attending [facility]

## 2018-11-27 NOTE — Discharge Instructions (Signed)
Discharge instructions:  Call office if you have any of the following: fever >101 F, chills, shortness of breath, excessive vaginal bleeding, incision drainage or problems, leg pain or redness, or any other concerns.   Activity: Do not lift > 10 lbs for 8 weeks.  No intercourse or tampons for 8 weeks.  No driving for 1-2 weeks.   You may feel some pain in your upper right abdomen/rib and right shoulder.  This is from the gas in the abdomen for surgery. This will subside over time, please be patient!  Take 600mg  Ibuprofen and 1000mg  Tylenol together, around the clock every 6 hours for at least the first 3-5 days.  After this you can take as needed.  This will help decrease inflammation and promote healing.  The narcotics you'll take just as needed, as they just trick your brain into thinking its not in pain. You may take gabapentin 300mg  at night.  Please don't limit yourself in terms of routine activity.  You will be able to do most things, although they may take longer to do or be a little painful.  You can do it!  Don't be a hero, but don't be a wimp either!

## 2018-11-27 NOTE — Progress Notes (Signed)
Patient will be discharged home with friend- waiting for him to arrive. Discharge instructions and prescriptions given and reviewed with patient. Incision cleaning kit given and reviewed instructions. Patient verbalized understanding. Will be escorted out by staff.

## 2018-11-28 LAB — SURGICAL PATHOLOGY

## 2018-11-29 ENCOUNTER — Encounter: Payer: Self-pay | Admitting: Obstetrics & Gynecology

## 2018-12-05 ENCOUNTER — Telehealth: Payer: Self-pay | Admitting: Obstetrics and Gynecology

## 2018-12-05 NOTE — Telephone Encounter (Signed)
I contacted Ms. Kalman Shan and reviewed her pathology below. I will see her in clinic as scheduled.   Andersson Larrabee Gaetana Michaelis, MD    SURGICAL PATHOLOGY SURGICAL PATHOLOGY  CASE: [id number]  [name]  Surgical Pathology Report      Specimen Submitted:  A. Uterus with cervix, bilateral tubes and ovaries  B. Appendix   Clinical History: Serous adenocarcinoma       DIAGNOSIS:  A. UTERUS AND CERVIX WITH BILATERAL FALLOPIAN TUBES AND OVARIES;  HYSTERECTOMY WITH BILATERAL SALPINGO-OOPHORECTOMY:  - HIGH-GRADE SEROUS ADENOCARCINOMA.  - REFER TO CANCER SUMMARY BELOW.   CANCER CASE SUMMARY: OVARY or FALLOPIAN TUBE or PRIMARY PERITONEUM  Procedure: Total hysterectomy with bilateral salpingo-oophorectomy  Specimen Integrity: Intact  Tumor Site: Bilateral ovaries and fallopian tubes  Ovarian Surface Involvement: Present, bilateral  Fallopian Tube Surface Involvement: Present, left  Tumor Size: Greatest dimension 1.2 cm  Histologic Type: Serous carcinoma  Histologic Grade: High grade  Implants: N/A  Other Tissue/ Organ Involvement: Appendix, parametrium  Largest Extrapelvic Peritoneal Focus: Microscopic  Peritoneal/Ascitic Fluid: Positive for malignancy  Treatment Effect: Moderate response; see comment.  Regional Lymph Nodes: No nodes submitted or found  Pathologic Stage Classification (pTNM, AJCC 8th Edition): pT3a pNX pM1b  / FIGO: IVB  TNM Descriptors: N/A   Comment:  Chemotherapy Response Score (CRS) is assigned based on evaluation of  tumor response in the omentum, which is not submitted in this specimen.  In the submitted tissue, there is a partial/moderate tumor response.   B. APPENDIX, APPENDECTOMY:  - TRANSMURAL INVOLVEMENT BY HIGH-GRADE SEROUS ADENOCARCINOMA.

## 2018-12-09 ENCOUNTER — Other Ambulatory Visit: Payer: Self-pay

## 2018-12-09 ENCOUNTER — Ambulatory Visit: Payer: Self-pay | Admitting: Licensed Clinical Social Worker

## 2018-12-09 ENCOUNTER — Telehealth: Payer: Self-pay | Admitting: Licensed Clinical Social Worker

## 2018-12-09 DIAGNOSIS — Z801 Family history of malignant neoplasm of trachea, bronchus and lung: Secondary | ICD-10-CM

## 2018-12-09 DIAGNOSIS — Z1379 Encounter for other screening for genetic and chromosomal anomalies: Secondary | ICD-10-CM | POA: Insufficient documentation

## 2018-12-09 DIAGNOSIS — C801 Malignant (primary) neoplasm, unspecified: Secondary | ICD-10-CM

## 2018-12-09 NOTE — Telephone Encounter (Signed)
Reviewed Meredith Jones's negative germline and HRD genetic testing with her. This normal result is reassuring and indicates that it is unlikely Meredith Jones's cancer is due to a hereditary cause.  It is unlikely that there is an increased risk of another cancer due to a mutation in one of these genes.  However, genetic testing is not perfect, and cannot definitively rule out a hereditary cause.  It will be important for her to keep in contact with genetics to learn if any additional testing may be needed in the future.

## 2018-12-09 NOTE — Progress Notes (Signed)
HPI:  Ms. Meredith Jones was previously seen in the [facility] due to a personal history of cancer and concerns regarding a hereditary predisposition to cancer. Please refer to our prior cancer genetics clinic note for more information regarding our discussion, assessment and recommendations, at the time. Ms. Meredith Jones recent genetic test results were disclosed to her, as were recommendations warranted by these results. These results and recommendations are discussed in more detail below.  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 Cancer Staging   Staging form: Exocrine Pancreas, AJCC 8th Edition - Clinical stage from 08/14/2018: Stage III (cT3, cN2, cM0) - Signed by Sindy Guadeloupe, MD on 08/17/2018   08/21/2018 -  Chemotherapy   The patient had palonosetron (ALOXI) injection 0.25 mg, 0.25 mg, Intravenous,  Once, 4 of 6 cycles Administration: 0.25 mg (08/21/2018), 0.25 mg (09/11/2018), 0.25 mg (10/02/2018), 0.25 mg (10/23/2018) pegfilgrastim (NEULASTA ONPRO KIT) injection 6 mg, 6 mg, Subcutaneous, Once, 4 of 6 cycles Administration: 6 mg (08/21/2018), 6 mg (09/11/2018), 6 mg (10/02/2018), 6 mg (10/23/2018) bevacizumab (AVASTIN) 800 mg in sodium chloride 0.9 % 100 mL chemo infusion, 15 mg/kg = 800 mg (100 % of original dose 15 mg/kg), Intravenous,  Once, 1 of 1 cycle Dose modification: 15 mg/kg (original dose 15 mg/kg, Cycle 1) Administration: 800 mg (08/21/2018) CARBOplatin (PARAPLATIN) 430 mg in sodium chloride 0.9 % 250 mL chemo infusion, 430 mg (100 % of original dose 429.5 mg), Intravenous,  Once, 4 of 6 cycles Dose modification:   (original dose 429.5 mg, Cycle 1) Administration: 430 mg (08/21/2018), 430 mg (09/11/2018), 430 mg (10/02/2018), 430 mg (10/23/2018) PACLitaxel (TAXOL) 270 mg in sodium chloride 0.9 % 250 mL chemo infusion (> 52m/m2), 175 mg/m2 = 270 mg, Intravenous,  Once, 4 of 6 cycles  Administration: 270 mg (08/21/2018), 270 mg (09/11/2018), 270 mg (10/02/2018), 270 mg (10/23/2018) fosaprepitant (EMEND) 150 mg, dexamethasone (DECADRON) 12 mg in sodium chloride 0.9 % 145 mL IVPB, , Intravenous,  Once, 4 of 6 cycles Administration:  (08/21/2018),  (09/11/2018),  (10/02/2018),  (10/23/2018) bevacizumab-awwb (MVASI) 800 mg in sodium chloride 0.9 % 100 mL chemo infusion, 15 mg/kg = 800 mg (100 % of original dose 15 mg/kg), Intravenous,  Once, 2 of 2 cycles Dose modification: 15 mg/kg (original dose 15 mg/kg, Cycle 2) Administration: 800 mg (09/11/2018), 800 mg (10/02/2018)  for chemotherapy treatment.    11/07/2018 Genetic Testing   Negative germline + somatic genetic testing. No pathogenic variants identified on the [facility] and [facility] HR[facility]. The [facility] panel offered by [employer] sequencing and deletion/duplication testing of the following 35 genes: APC, ATM, AXIN2, BARD1, BMPR1A, BRCA1, BRCA2, BRIP1, CHD1, CDK4, CDKN2A, CHEK2, EPCAM (large rearrangement only), HOXB13, GALNT12, MLH1, MSH2, MSH3, MSH6, MUTYH, NBN, NTHL1, PALB2, PMS2, PTEN, RAD51C, RAD51D, RNF43, RPS20, SMAD4, STK11, and TP53. Sequencing was performed for select regions of POLE and POLD1, and large rearrangement analysis was performed for select regions of GREM1. The report date is 11/07/2018.      FAMILY HISTORY:  We obtained a detailed, 4-generation family history.  Significant diagnoses are listed below: Family History  Problem Relation Age of Onset  . Congestive Heart Failure Mother   . Diabetes Mother   . Stroke Mother   . Hypothyroidism Father   . Cancer Father        lung cancer  . Cancer Paternal Grandmother  stomach   Ms. Meredith Jones does not have children. She had one brother who died shortly after he was born.   Ms. Meredith Jones's mother died at 50, no history of cancer. Patinet had 1 maternal uncle and 1 maternal aunt, no cancers. No known cancers in maternal  cousins. Her maternal grandparents both died in their 29s, no cancers.  Ms. Meredith Jones's father died at 38 and had lung cancer. Patient had several half aunts/half uncles, she believes they may have had cancer but she isn't sure of the types. No known cancers in cousins. She does not have information about her grandfather, her paternal grandmother died at 30 with no history of cancer.   Ms. Meredith Jones is unaware of previous family history of genetic testing for hereditary cancer risks. Patient's ancestors are of [place] descent. There is no reported Ashkenazi Jewish ancestry. There is no known consanguinity.   GENETIC TEST RESULTS: Genetic testing reported out on 11/07/2018  through the [facility] My[facility] + My[facility] cancer panel found no pathogenic mutations. The [facility] gene panel offered by [employer] includes sequencing and deletion/duplication testing of the following 35 genes: APC, ATM, AXIN2, BARD1, BMPR1A, BRCA1, BRCA2, BRIP1, CHD1, CDK4, CDKN2A, CHEK2, EPCAM (large rearrangement only), HOXB13, GALNT12, MLH1, MSH2, MSH3, MSH6, MUTYH, NBN, NTHL1, PALB2, PMS2, PTEN, RAD51C, RAD51D, RNF43, RPS20, SMAD4, STK11, and TP53. Sequencing was performed for select regions of POLE and POLD1, and large rearrangement analysis was performed for select regions of GREM1. The test report has been scanned into EPIC and is located under the Molecular Pathology section of the Results Review tab.  A portion of the result report is included below for reference.       We discussed with Ms. Meredith Jones that because current genetic testing is not perfect, it is possible there may be a gene mutation in one of these genes that current testing cannot detect, but that chance is small.  We also discussed, that there could be another gene that has not yet been discovered, or that we have not yet tested, that is responsible for the cancer diagnoses in the family. It is also possible there is a hereditary cause for the cancer in  the family that Ms. Meredith Jones did not inherit and therefore was not identified in her testing.  Therefore, it is important to remain in touch with cancer genetics in the future so that we can continue to offer Ms. Meredith Jones the most up to date genetic testing.   ADDITIONAL GENETIC TESTING: We discussed with Ms. Meredith Jones that her genetic testing was fairly extensive.  If there are genes identified to increase cancer risk that can be analyzed in the future, we would be happy to discuss and coordinate this testing at that time.    CANCER SCREENING RECOMMENDATIONS: Ms. Meredith Jones test result is considered negative (normal).  This means that we have not identified a hereditary cause for her  personal and family history of cancer at this time. Most cancers happen by chance and this negative test suggests that her cancer may fall into this category.    While reassuring, this does not definitively rule out a hereditary predisposition to cancer. It is still possible that there could be genetic mutations that are undetectable by current technology. There could be genetic mutations in genes that have not been tested or identified to increase cancer risk.  Therefore, it is recommended she continue to follow the cancer management and screening guidelines provided by her oncology and primary healthcare provider.   An individual's cancer  risk and medical management are not determined by genetic test results alone. Overall cancer risk assessment incorporates additional factors, including personal medical history, family history, and any available genetic information that may result in a personalized plan for cancer prevention and surveillance.  RECOMMENDATIONS FOR FAMILY MEMBERS:  Relatives in this family might be at some increased risk of developing cancer, over the general population risk, simply due to the family history of cancer.  We recommended female relatives in this family have a yearly mammogram beginning at age 81, or 51  years younger than the earliest onset of cancer, an annual clinical breast exam, and perform monthly breast self-exams. Female relatives in this family should also have a gynecological exam as recommended by their primary provider. All family members should have a colonoscopy by age 19, or as directed by their physicians.  FOLLOW-UP: Lastly, we discussed with Ms. Meredith Jones that cancer genetics is a rapidly advancing field and it is possible that new genetic tests will be appropriate for her and/or her family members in the future. We encouraged her to remain in contact with cancer genetics on an annual basis so we can update her personal and family histories and let her know of advances in cancer genetics that may benefit this family.   Our contact number was provided. Ms. Meredith Jones questions were answered to her satisfaction, and she knows she is welcome to call us at anytime with additional questions or concerns.   Faith Rogue, MS, [facility] Genetic Counselor [email or web address] Phone: [phone]

## 2018-12-10 ENCOUNTER — Other Ambulatory Visit: Payer: Self-pay

## 2018-12-10 ENCOUNTER — Inpatient Hospital Stay (HOSPITAL_BASED_OUTPATIENT_CLINIC_OR_DEPARTMENT_OTHER): Payer: Commercial Managed Care - PPO | Admitting: Obstetrics and Gynecology

## 2018-12-10 ENCOUNTER — Inpatient Hospital Stay: Payer: Commercial Managed Care - PPO

## 2018-12-10 ENCOUNTER — Other Ambulatory Visit: Payer: Self-pay | Admitting: *Deleted

## 2018-12-10 VITALS — BP 134/87 | HR 95 | Temp 98.3°F | Resp 16 | Wt 105.5 lb

## 2018-12-10 DIAGNOSIS — C801 Malignant (primary) neoplasm, unspecified: Secondary | ICD-10-CM

## 2018-12-10 DIAGNOSIS — Z90710 Acquired absence of both cervix and uterus: Secondary | ICD-10-CM

## 2018-12-10 DIAGNOSIS — Z90722 Acquired absence of ovaries, bilateral: Secondary | ICD-10-CM

## 2018-12-10 LAB — CBC WITH DIFFERENTIAL/PLATELET
Abs Immature Granulocytes: 0.02 10*3/uL (ref 0.00–0.07)
Basophils Absolute: 0.1 10*3/uL (ref 0.0–0.1)
Basophils Relative: 1 %
Eosinophils Absolute: 0.9 10*3/uL — ABNORMAL HIGH (ref 0.0–0.5)
Eosinophils Relative: 13 %
HCT: 32.5 % — ABNORMAL LOW (ref 36.0–46.0)
Hemoglobin: 10.6 g/dL — ABNORMAL LOW (ref 12.0–15.0)
Immature Granulocytes: 0 %
Lymphocytes Relative: 14 %
Lymphs Abs: 0.9 10*3/uL (ref 0.7–4.0)
MCH: 37.6 pg — ABNORMAL HIGH (ref 26.0–34.0)
MCHC: 32.6 g/dL (ref 30.0–36.0)
MCV: 115.2 fL — ABNORMAL HIGH (ref 80.0–100.0)
Monocytes Absolute: 0.4 10*3/uL (ref 0.1–1.0)
Monocytes Relative: 7 %
Neutro Abs: 4.4 10*3/uL (ref 1.7–7.7)
Neutrophils Relative %: 65 %
Platelets: 175 10*3/uL (ref 150–400)
RBC: 2.82 MIL/uL — ABNORMAL LOW (ref 3.87–5.11)
RDW: 15.8 % — ABNORMAL HIGH (ref 11.5–15.5)
WBC: 6.8 10*3/uL (ref 4.0–10.5)
nRBC: 0 % (ref 0.0–0.2)

## 2018-12-10 LAB — COMPREHENSIVE METABOLIC PANEL
ALT: 28 U/L (ref 0–44)
AST: 28 U/L (ref 15–41)
Albumin: 3.7 g/dL (ref 3.5–5.0)
Alkaline Phosphatase: 48 U/L (ref 38–126)
Anion gap: 13 (ref 5–15)
BUN: 19 mg/dL (ref 8–23)
CO2: 27 mmol/L (ref 22–32)
Calcium: 9.6 mg/dL (ref 8.9–10.3)
Chloride: 100 mmol/L (ref 98–111)
Creatinine, Ser: 0.45 mg/dL (ref 0.44–1.00)
GFR calc Af Amer: >60 mL/min (ref >60)
GFR calc non Af Amer: >60 mL/min (ref >60)
Glucose, Bld: 127 mg/dL — ABNORMAL HIGH (ref 70–99)
Potassium: 4.2 mmol/L (ref 3.5–5.1)
Sodium: 140 mmol/L (ref 135–145)
Total Bilirubin: 0.4 mg/dL (ref 0.3–1.2)
Total Protein: 7.1 g/dL (ref 6.5–8.1)

## 2018-12-10 NOTE — Progress Notes (Signed)
Gynecologic Oncology Interval Visit   Referring Provider: Dr. Janese Banks  Chief Complaint: high grade serous adenocarcinoma  Subjective:  Meredith Jones is a 63 y.o. [place] female, diagnosed with high grade serous adenocarcinoma, s/p 4 cycles of carbo-taxol-bevacizumab (bevacizumab held for cycle 4) who returns to clinic today for.   11/26/2018 she underwent robotic assisted total laparoscopic hysterectomy and bilateral salpingo-oophorectomy, appendectomy, extensive lysis of adhesions including enterolysis and ovariolysis (>45 minutes), and R1 optimal debulking. Repair of vaginal laceration. There was miliary disease 1-5 mm, including a small nodule on the transverse colon, scattered throughout the peritoneal and mesenteric surfaces.   Postoperative course was uncomplicated.  She was discharged on POD 1.She is improving everyday and able to do light house work. She is unable to say if she is in or out of bed/chair >50% of her waking hours. She has had vaginal bleeding but that has improved. She wears a pad. She has noted stiffness in her right leg. She has no pain.   Pathology: DIAGNOSIS:  A. UTERUS AND CERVIX WITH BILATERAL FALLOPIAN TUBES AND OVARIES;  HYSTERECTOMY WITH BILATERAL SALPINGO-OOPHORECTOMY:  - HIGH-GRADE SEROUS ADENOCARCINOMA.  - REFER TO CANCER SUMMARY BELOW.   CANCER CASE SUMMARY: OVARY or FALLOPIAN TUBE or PRIMARY PERITONEUM  Procedure: Total hysterectomy with bilateral salpingo-oophorectomy  Specimen Integrity: Intact  Tumor Site: Bilateral ovaries and fallopian tubes  Ovarian Surface Involvement: Present, bilateral  Fallopian Tube Surface Involvement: Present, left  Tumor Size: Greatest dimension 1.2 cm  Histologic Type: Serous carcinoma  Histologic Grade: High grade  Implants: N/A  Other Tissue/ Organ Involvement: Appendix, parametrium  Largest Extrapelvic Peritoneal Focus: Microscopic  Peritoneal/Ascitic Fluid: Positive for malignancy  Treatment Effect: Moderate  response; see comment.  Regional Lymph Nodes: No nodes submitted or found  Pathologic Stage Classification (pTNM, AJCC 8th Edition): pT3a pNX pM1b  FIGO: IVB  TNM Descriptors: N/A   Comment:  Chemotherapy Response Score (CRS) is assigned based on evaluation of tumor response in the omentum, which is not submitted in this specimen. In the submitted tissue, there is a partial/moderate tumor response.   B. APPENDIX, APPENDECTOMY:  - TRANSMURAL INVOLVEMENT BY HIGH-GRADE SEROUS ADENOCARCINOMA.      Gynecologic Oncology History:  Meredith Jones is a pleasant [place] female, diagnosed with high grade serous adenocarcinoma.   She presented to ER on 07/23/2018 with symptoms of bilateral lower extremity swelling as well as abdominal distention. US abdomen revealed large volume ascites, no overt changes of hepatic cirrhosis and was referred to Dr. Tama Headings. She underwent US paracentesis on 07/29/2018. 5L of fluid was removed.   Pathology:  A. ABDOMINAL FLUID; PARACENTESIS:  - MALIGNANT CELLS PRESENT.  - ADENOCARCINOMA, MOST CONSISTENT WITH HIGH-GRADE SEROUS CARCINOMA.   08/04/2018- CT C/A/P IMPRESSION: 1. There are cystic lesions of the enlarged bilateral ovaries, largest lesion on the left measuring at least 6.2 cm (series 2, image 116, series 6, image 76). Findings are highly concerning for ovarian neoplasm, particularly given presence of otherwise unexplained ascites and abdominal lymphadenopathy.  2. There are numerous bulky retroperitoneal lymph nodes, largest left periaortic nodes or conglomerates near the superior mesenteric artery measuring at least 4.4 x 2.9 cm (series 2, image 64), concerning for metastatic disease, or alternately primary malignancy such as lymphoma.  She saw Dr. Janese Banks on 08/08/2018. Noted to have macrocytic anemia and found to have low B12 levels. TSH- 46.46; FT3 1.1, FT4 0.74. She has seen her endocrinologist and adjusted her levothyroxine. History of type 2 diabetes.  Last hA1c was 6.5 (  08/04/2018). CA 125 08/08/2018 491  She opted for neoadjuvant chemotherapy.  CT C/A/P- 10/16/2018-  1.Demonstrates a marked positive response to therapy with regression of bilateral ovarian lesions and resolution of previously noted retroperitoneal lymphadenopathy. Additionally, previously noted ascites has resolved. 2. No findings to suggest metastatic disease in the thorax. 3. Aortic atherosclerosis, in addition to left anterior descending coronary artery disease. Please note that although the presence of coronary artery calcium documents the presence of coronary artery disease, the severity of this disease and any potential stenosis cannot be assessed on this non-gated CT examination. Assessment for potential risk factor modification, dietary therapy or pharmacologic therapy may be warranted, if clinically indicated.  She received cycle 4 of carbo-Taxol on 10/23/2018.  She received 5 doses of Venofer (10/23/2018-11/06/2018) and 4 doses of vitamin B12 (10/23/2018-11/06/2018) for anemia.   CA 125 08/08/2018 491.0 09/11/2018 200 10/02/2018 78.5 10/23/2018 31.2    Genetics Testing:MyRisk testing negative for APC, ATM, AXIN2, BARD1, BMPR1A, BRIP1, BRCA1, BRCA2, CDH1, CDK4, CDKN2A, CHEK2, EPCAM (large rearramgement only), HOXB13 (sequencing only), GALNT12, MLH1, MSH2, MSH3 (excluding repetitive portions of exon1) MSH6, MUTYH, NBN, NTHL1, PALB2, PMS2, PTEN, RAD51C, RAD51D, RNF43, SMAD4, STK11, TP53. Sequencing for select regions of POLE and POLD1, and large rearrangement analysis for select regions of GREM1 were negative.  Tumor testing: negative for  BRCA1, BRCA2; GIS negative  Problem List: Patient Active Problem List   Diagnosis Date Noted  . Genetic testing 12/09/2018  . Status post hysterectomy 11/26/2018  . Need for prophylactic vaccination and inoculation against influenza 11/04/2018  . Anemia of chronic disease 10/22/2018  . B12 deficiency 10/22/2018  . Family history of  lung cancer   . Serous adenocarcinoma ([place]) 08/13/2018  . Goals of care, counseling/discussion 08/11/2018  . Primary high grade serous adenocarcinoma of ovary ([place]) 08/11/2018  . Loss of weight   . Benign neoplasm of descending colon   . Type 2 diabetes mellitus without complication, without long-term current use of insulin ([place]) 06/07/2015  . 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 Cyst of ovary 06/09/2014  . Bulky or enlarged uterus 06/09/2014  . Hypercholesteremia 02/09/2007  . Allergic rhinitis 11/27/2006  . Endometriosis 11/27/2006  . Adult hypothyroidism 11/27/2006    Past Medical History: Past Medical History:  Diagnosis Date  . Allergy   . Anemia   . Complication of anesthesia   . Diabetes mellitus without complication ([place])   . Diverticulosis   . Family history of adverse reaction to anesthesia    mom-n/v  . Family history of lung cancer   . GERD (gastroesophageal reflux disease)    h/o  . Hyperlipidemia   . Hypothyroidism   . Ovarian cancer ([place]) 08/04/2018   Chemo tx's.  Marland Kitchen PONV (postoperative nausea and vomiting)    sick with tonsillectomy  . Thyroid disease    Past Surgical History: Past Surgical History:  Procedure Laterality Date  . BACK SURGERY  2007   Lumbar Surgery  . BREAST CYST ASPIRATION Right    Around 10 years ago. Pt thinks it was the right breast but not sure.  Marland Kitchen CATARACT EXTRACTION Left 2010   Right Eye 2011  . COLONOSCOPY WITH PROPOFOL N/A 09/02/2015   Procedure: COLONOSCOPY WITH PROPOFOL;  Surgeon: [name], MD;  Location: [place];  Service: Endoscopy;  Laterality: N/A;  Diabetic - oral meds  . ESOPHAGOGASTRODUODENOSCOPY (EGD) WITH PROPOFOL N/A 11/03/2015   Procedure: ESOPHAGOGASTRODUODENOSCOPY (EGD) WITH PROPOFOL;  Surgeon: [name], MD;  Location: [facility];  Service: Endoscopy;  Laterality: N/A;  . EYE SURGERY Bilateral 06/2014    tear duck catherization ( Dr. Vallarie Mare)  . LAPAROSCOPIC APPENDECTOMY  11/26/2018    Procedure: APPENDECTOMY LAPAROSCOPIC;  Surgeon: Ward, Honor Loh, MD;  Location: [facility];  Service: Gynecology;;  . POLYPECTOMY  09/02/2015   Procedure: POLYPECTOMY;  Surgeon: [name], MD;  Location: [place];  Service: Endoscopy;;  . TONSILLECTOMY  1963   Past Gynecologic History:  Per HPI  OB History: P0 OB History  No obstetric history on file.   Family History: Family History  Problem Relation Age of Onset  . Congestive Heart Failure Mother   . Diabetes Mother   . Stroke Mother   . Hypothyroidism Father   . Cancer Father        lung cancer  . Cancer Paternal Grandmother        stomach    Social History: Social History   Socioeconomic History  . Marital status: Single    Spouse name: Not on file  . Number of children: Not on file  . Years of education: Not on file  . Highest education level: Not on file  Occupational History  . Not on file  Social Needs  . Financial resource strain: Not on file  . Food insecurity    Worry: Not on file    Inability: Not on file  . Transportation needs    Medical: Not on file    Non-medical: Not on file  Tobacco Use  . Smoking status: Never Smoker  . Smokeless tobacco: Never Used  Substance and Sexual Activity  . Alcohol use: No  . Drug use: No  . Sexual activity: Not on file  Lifestyle  . Physical activity    Days per week: Not on file    Minutes per session: Not on file  . Stress: Not on file  Relationships  . Social Herbalist on phone: Not on file    Gets together: Not on file    Attends religious service: Not on file    Active member of club or organization: Not on file    Attends meetings of clubs or organizations: Not on file    Relationship status: Not on file  Other Topics Concern  . Not on file  Social History Narrative  . Not on file    Allergies: Allergies  Allergen Reactions  . Clarithromycin Nausea Only  . Doxycycline Nausea Only    dizziness  . Penicillins Hives     Did it involve swelling of the face/tongue/throat, SOB, or low BP? No Did it involve sudden or severe rash/hives, skin peeling, or any reaction on the inside of your mouth or nose? No Did you need to seek medical attention at a hospital or doctor's office? No When did it last happen?50+ years ago If all above answers are "NO", may proceed with cephalosporin use.     Current Medications: Current Outpatient Medications on File Prior to Visit  Medication Sig Dispense Refill  . calcipotriene (DOVONOX) 0.005 % ointment Apply 1 application topically as needed. CALCIPOTRIENE, 0.005% (External Ointment) - Historical Medication  apply to skin daily (0.005 %) Active    . fexofenadine (ALLEGRA) 60 MG tablet Take 60 mg by mouth daily as needed for allergies.     Marland Kitchen gabapentin (NEURONTIN) 300 MG capsule Take 1 capsule (300 mg total) by mouth at bedtime. 30 capsule 0  . glipiZIDE (GLUCOTROL) 5 MG tablet TAKE 1 TABLET BY MOUTH DAILY BEFORE BREAKFAST (  Patient taking differently: Take 5 mg by mouth daily before breakfast. ) 90 tablet 1  . glucose blood (ONE TOUCH ULTRA TEST) test strip USE TO TEST BLOOD SUGAR TWICE A DAY AS INSTRUCTED 100 each 12  . levothyroxine (SYNTHROID) 100 MCG tablet Take 1 tablet (100 mcg total) by mouth daily. (Patient taking differently: Take 100 mcg by mouth at bedtime. ) 90 tablet 1  . metFORMIN (GLUCOPHAGE) 500 MG tablet Take 2 tablets (1,000 mg total) by mouth 2 (two) times daily. 360 tablet 0  . MULTIPLE VITAMIN PO Take 1 tablet by mouth daily.     Glory Rosebush DELICA LANCETS 00X MISC USE TO TEST BLOOD SUGAR ONCE A DAY 100 each 12  . oxyCODONE (ROXICODONE) 5 MG immediate release tablet Take 1 tablet (5 mg total) by mouth every 8 (eight) hours as needed. 16 tablet 0  . simvastatin (ZOCOR) 20 MG tablet Take 1 tablet (20 mg total) by mouth at bedtime. 90 tablet 1  . acetaminophen (TYLENOL) 500 MG tablet Take 2 tablets (1,000 mg total) by mouth every 6 (six) hours. (Patient not  taking: Reported on 12/09/2018)    . aspirin 81 MG tablet Take 81 mg by mouth daily.     Marland Kitchen dexamethasone (DECADRON) 4 MG tablet Take 2 tablets (8 mg total) by mouth daily. Start the day after carboplatin chemotherapy for 3 days. 30 tablet 1  . furosemide (LASIX) 20 MG tablet Take 1 tablet (20 mg total) by mouth daily. (Patient not taking: Reported on 11/21/2018) 20 tablet 0  . ibuprofen (ADVIL) 600 MG tablet Take 1 tablet (600 mg total) by mouth every 6 (six) hours. (Patient not taking: Reported on 12/09/2018) 45 tablet 1  . LORazepam (ATIVAN) 0.5 MG tablet Take 1 tablet (0.5 mg total) by mouth every 6 (six) hours as needed (Nausea or vomiting). (Patient not taking: Reported on 11/26/2018) 30 tablet 0  . ondansetron (ZOFRAN) 8 MG tablet Take 1 tablet (8 mg total) by mouth 2 (two) times daily as needed for refractory nausea / vomiting. Start on day 3 after carboplatin chemo. (Patient not taking: Reported on 11/21/2018) 30 tablet 1  . prochlorperazine (COMPAZINE) 10 MG tablet Take 1 tablet (10 mg total) by mouth every 6 (six) hours as needed (Nausea or vomiting). (Patient not taking: Reported on 11/21/2018) 30 tablet 1  . spironolactone (ALDACTONE) 50 MG tablet Take 1 tablet (50 mg total) by mouth daily. (Patient not taking: Reported on 12/09/2018) 20 tablet 0   No current facility-administered medications on file prior to visit.    Review of Systems General: no complaints  HEENT: no complaints  Lungs: no complaints  Cardiac: no complaints  GI: some nausea but tolerating oral intake; she has had postop nausea in the past and suspects due to anesthesia  GU: per HPI  Musculoskeletal: h/o stiffness (chronic); c/o right leg stiffness  Extremities: no complaints  Skin: no complaints  Neuro: no complaints  Endocrine: no complaints  Psych: no complaints       Objective:  Physical Examination:  Today's Vitals   12/10/18 1025  BP: 134/87  Pulse: 95  Resp: 16  Temp: 98.3 F (36.8 C)  TempSrc:  Temporal  SpO2: 100%  Weight: 105 lb 8 oz (47.9 kg)   Body mass index is 17.56 kg/m.  Performance status: 2-3  GENERAL: Patient is very thin but other no acute distress HEENT:  PERRL, neck supple with midline trachea. Thyroid without masses.  NODES:  No cervical, supraclavicular, axillary, or  inguinal lymphadenopathy palpated.  ABDOMEN:  Soft, nontender, nontender. No ascites or hepatosplenomegaly or masses.  No hernias Incisions: CDI without infection MSK:  No focal spinal tenderness to palpation. Full range of motion bilaterally in the upper extremities. EXTREMITIES:  No peripheral edema.   SKIN:  Clear with no obvious rashes or skin changes. No nail dyscrasia. NEURO:  Nonfocal. Well oriented.  Appropriate affect.  Pelvic: Pad check and minimal light blood present. The remainder of the exam was deferred.   Lab Review Lab Results  Component Value Date   WBC 7.3 11/27/2018   HGB 8.3 (L) 11/27/2018   HCT 24.5 (L) 11/27/2018   MCV 109.9 (H) 11/27/2018   PLT 139 (L) 11/27/2018     Chemistry      Component Value Date/Time   NA 130 (L) 11/27/2018 0627   NA 142 05/31/2017 0836   K 3.8 11/27/2018 0627   CL 99 11/27/2018 0627   CO2 24 11/27/2018 0627   BUN 7 (L) 11/27/2018 0627   BUN 9 05/31/2017 0836   CREATININE 0.57 11/27/2018 0627   CREATININE 0.57 11/13/2016 0933   GLU 89 03/25/2013      Component Value Date/Time   CALCIUM 8.3 (L) 11/27/2018 0627   ALKPHOS 95 10/23/2018 0806   AST 31 10/23/2018 0806   ALT 29 10/23/2018 0806   BILITOT 0.4 10/23/2018 0806   BILITOT <0.2 05/31/2017 0836     Lab Results  Component Value Date   TSH 1.899 11/12/2018     Ref Range & Units 4wk ago  Free T4 0.61 - 1.12 ng/dL 1.33High      Ref Range & Units 4wk ago  T3, Free 2.0 - 4.4 pg/mL 1.4Low      Radiologic Imaging: As per HPI       Assessment:  Meredith Jones is a 63 y.o. 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 Bulky retroperitoneal adenopathy adjacent and surrounding superior mesenteric artery. S/p neoadjuvant chemotherapy with 3 cycles of carboplatin-paclitaxel-bevacizumab following by cycle #4 with chemotherapy only with excellent response based on CA125, imaging and exam. S/p 11/26/2018 she underwent robotic assisted total laparoscopic hysterectomy and bilateral salpingo-oophorectomy, appendectomy, extensive lysis of adhesions including enterolysis and ovariolysis (>45 minutes), and R1 optimal debulking.   Anemia.   History of hypoalbuminemia, improved.   Thrombocytopenia, mild.   Hyponatremia, postop  Abnormal TFTs followed by Dr. Gabriel Carina, Endocrinology.   Penicillin allergy  Medical co-morbidities complicating care:Diabetes mellitus without complication ([place]), Hyperlipidemia, Hypothyroid disease. Body mass index is 18.95 kg/m.  Plan:  We discussed that she is still recovering from surgery. The bleeding is expected given her procedure and vaginal laceration. Pelvic exam deferred given close approximation to surgery and improving symptoms. Given her performance status and overall conditioning I have recommended that we hold chemotherapy two more weeks. When she restarts also consider 3 additional cycles of therapy. Dr. Janese Banks to discuss the role of adding bevacizumab to chemotherapy (would not start until the second cycle after surgery) followed bevacizumab maintenance, or active surveillance after chemotherapy. There are benefits and risks to each approach.   We will reach out to Dr. Gabriel Carina regarding thyroid tests.   Follow up CBC and CMP to assess prior lab findings.   Follow up in one month for her postoperative check.   The patient's diagnosis, an outline of the further diagnostic and laboratory studies which will be required, the recommendation for surgery, and alternatives were discussed  with her and her accompanying family members.  All questions were answered  to their satisfaction.  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  Reginal Lutes, MD  CC:  Dr. Janese Banks

## 2018-12-10 NOTE — Progress Notes (Signed)
Pt still has bleeding and some clots from surgery but she wears a depends and some days less drainage . She does have some nausea at times.

## 2018-12-11 ENCOUNTER — Inpatient Hospital Stay: Payer: Commercial Managed Care - PPO

## 2018-12-11 NOTE — Progress Notes (Signed)
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  Patient completed neoadjuvant chemotherapy.  S/p surgery on 10/14 lap hysterectomy, bilateral salpingo-oophorectomy, appendectomy, extensive lysis of adhesions and debulking.   Spoke with patient via phone for nutrition follow-up.  Patient reports that appetite is "ravenous". Reports that it took her a couple of weeks to recover after surgery but she is doing much better now.  Reports that she ate 1/2 griller (veggie burger) and grilled cheese sandwich for breakfast and lunch as peanut butter sandwich.  Supper she is going to have oatmeal with veggies and 3 peanut butter crackers.  Will only drink 1 boost glucose control shake as she feels like drinking 2 will increase her blood glucose.    Denies any nutrition impact symptoms  Medications: reviewed  Labs: reviewed  Anthropometrics:   Reviewed weights with patient.   10/28 105 lb 8 oz decreased from 106 lb on 10/9.  Weight continues to decline   NUTRITION DIAGNOSIS: Inadequate oral intake continues   INTERVENTION:  Discussed importance of increasing calories and protein with weight continuing to go down and body trying to heal from surgery.   Reviewed again with patient the importance of eating well balanced foods and not avoiding foods in fear of blood glucose being elevated.   Recommend patient to eat 3 good meals per day with 2 snacks between meals.      MONITORING, EVALUATION, GOAL: Patient will consume adequate calories and protein to prevent weight loss   NEXT VISIT: Nov 9th during infusion  Kerly Rigsbee B. Zenia Resides, [place], [place] Registered Dietitian [phone] (pager)

## 2018-12-18 ENCOUNTER — Inpatient Hospital Stay
Payer: Commercial Managed Care - PPO | Attending: Hospice and Palliative Medicine | Admitting: Hospice and Palliative Medicine

## 2018-12-18 DIAGNOSIS — D509 Iron deficiency anemia, unspecified: Secondary | ICD-10-CM | POA: Insufficient documentation

## 2018-12-18 DIAGNOSIS — C772 Secondary and unspecified malignant neoplasm of intra-abdominal lymph nodes: Secondary | ICD-10-CM | POA: Insufficient documentation

## 2018-12-18 DIAGNOSIS — Z90710 Acquired absence of both cervix and uterus: Secondary | ICD-10-CM | POA: Insufficient documentation

## 2018-12-18 DIAGNOSIS — E039 Hypothyroidism, unspecified: Secondary | ICD-10-CM | POA: Insufficient documentation

## 2018-12-18 DIAGNOSIS — Z79899 Other long term (current) drug therapy: Secondary | ICD-10-CM | POA: Insufficient documentation

## 2018-12-18 DIAGNOSIS — E785 Hyperlipidemia, unspecified: Secondary | ICD-10-CM | POA: Insufficient documentation

## 2018-12-18 DIAGNOSIS — E7800 Pure hypercholesterolemia, unspecified: Secondary | ICD-10-CM | POA: Insufficient documentation

## 2018-12-18 DIAGNOSIS — Z7982 Long term (current) use of aspirin: Secondary | ICD-10-CM | POA: Insufficient documentation

## 2018-12-18 DIAGNOSIS — C801 Malignant (primary) neoplasm, unspecified: Secondary | ICD-10-CM | POA: Diagnosis not present

## 2018-12-18 DIAGNOSIS — G620 Drug-induced polyneuropathy: Secondary | ICD-10-CM | POA: Insufficient documentation

## 2018-12-18 DIAGNOSIS — Z515 Encounter for palliative care: Secondary | ICD-10-CM | POA: Insufficient documentation

## 2018-12-18 DIAGNOSIS — J309 Allergic rhinitis, unspecified: Secondary | ICD-10-CM | POA: Insufficient documentation

## 2018-12-18 DIAGNOSIS — Z5111 Encounter for antineoplastic chemotherapy: Secondary | ICD-10-CM | POA: Insufficient documentation

## 2018-12-18 DIAGNOSIS — R600 Localized edema: Secondary | ICD-10-CM | POA: Insufficient documentation

## 2018-12-18 DIAGNOSIS — Z7984 Long term (current) use of oral hypoglycemic drugs: Secondary | ICD-10-CM | POA: Insufficient documentation

## 2018-12-18 DIAGNOSIS — D519 Vitamin B12 deficiency anemia, unspecified: Secondary | ICD-10-CM | POA: Insufficient documentation

## 2018-12-18 DIAGNOSIS — E1136 Type 2 diabetes mellitus with diabetic cataract: Secondary | ICD-10-CM | POA: Insufficient documentation

## 2018-12-18 DIAGNOSIS — K219 Gastro-esophageal reflux disease without esophagitis: Secondary | ICD-10-CM | POA: Insufficient documentation

## 2018-12-18 DIAGNOSIS — Z90722 Acquired absence of ovaries, bilateral: Secondary | ICD-10-CM | POA: Insufficient documentation

## 2018-12-18 NOTE — Progress Notes (Signed)
Virtual Visit via Telephone Note  I connected with Meredith Jones on 12/18/18 at 10:30 AM EST by telephone and verified that I am speaking with the correct person using two identifiers.   I discussed the limitations, risks, security and privacy concerns of performing an evaluation and management service by telephone and the availability of in person appointments. I also discussed with the patient that there may be a patient responsible charge related to this service. The patient expressed understanding and agreed to proceed.   History of Present Illness: Palliative Care consult requested for this 63 y.o. 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  Abdominal CT revealed cystic lesions in the bilateral ovaries with numerous bulky retroperitoneal lymph nodes and a large amount of ascites.  She has required therapeutic paracentesis.  Patient is being followed by GYN oncology with plan for neoadjuvant chemotherapy and possible later surgery.  Patient was referred to palliative care to help address goals and manage ongoing symptoms.   Observations/Objective: Patient underwent TAH and BSO on 11/26/2018.  She reports that postoperatively she has done quite well.  She feels like she is mostly back to her baseline.  She denies pain, bleeding, or other distressing symptoms.  She reports that her oral intake has improved.  Weight is mostly stable over the past month at 105 pounds.  Patient had no acute concerns or questions today.  She did not require any refills today.  Assessment and Plan: Stage III serous adenocarcinoma the ovary -status post TAH/BSO on 10/14.  Patient appears to be doing well postoperatively.  She continues to receive follow-up with GYN oncology and medical oncology with plan for further systemic treatment.  Symptomatically, she appears to be doing well.  GOC -goals are aligned with continued treatment.  Follow Up  Instructions: Follow-up telephone visit in 1 month   I discussed the assessment and treatment plan with the patient. The patient was provided an opportunity to ask questions and all were answered. The patient agreed with the plan and demonstrated an understanding of the instructions.   The patient was advised to call back or seek an in-person evaluation if the symptoms worsen or if the condition fails to improve as anticipated.  I provided 5 minutes of non-face-to-face time during this encounter.   Irean Hong, NP

## 2018-12-19 ENCOUNTER — Encounter: Payer: Self-pay | Admitting: Oncology

## 2018-12-19 ENCOUNTER — Other Ambulatory Visit: Payer: Self-pay

## 2018-12-19 NOTE — Progress Notes (Signed)
Patient stated that she's been having right leg discomfort but not pain.

## 2018-12-21 ENCOUNTER — Other Ambulatory Visit: Payer: Self-pay | Admitting: *Deleted

## 2018-12-21 DIAGNOSIS — C801 Malignant (primary) neoplasm, unspecified: Secondary | ICD-10-CM

## 2018-12-22 ENCOUNTER — Inpatient Hospital Stay: Payer: Commercial Managed Care - PPO

## 2018-12-22 ENCOUNTER — Inpatient Hospital Stay (HOSPITAL_BASED_OUTPATIENT_CLINIC_OR_DEPARTMENT_OTHER): Payer: Commercial Managed Care - PPO | Admitting: Hospice and Palliative Medicine

## 2018-12-22 ENCOUNTER — Other Ambulatory Visit: Payer: Self-pay

## 2018-12-22 ENCOUNTER — Inpatient Hospital Stay (HOSPITAL_BASED_OUTPATIENT_CLINIC_OR_DEPARTMENT_OTHER): Payer: Commercial Managed Care - PPO | Admitting: Oncology

## 2018-12-22 VITALS — BP 111/73 | HR 77 | Temp 97.5°F | Wt 102.0 lb

## 2018-12-22 VITALS — Resp 18

## 2018-12-22 DIAGNOSIS — Z515 Encounter for palliative care: Secondary | ICD-10-CM

## 2018-12-22 DIAGNOSIS — E7800 Pure hypercholesterolemia, unspecified: Secondary | ICD-10-CM | POA: Diagnosis not present

## 2018-12-22 DIAGNOSIS — C569 Malignant neoplasm of unspecified ovary: Secondary | ICD-10-CM | POA: Diagnosis present

## 2018-12-22 DIAGNOSIS — E538 Deficiency of other specified B group vitamins: Secondary | ICD-10-CM

## 2018-12-22 DIAGNOSIS — C801 Malignant (primary) neoplasm, unspecified: Secondary | ICD-10-CM

## 2018-12-22 DIAGNOSIS — K219 Gastro-esophageal reflux disease without esophagitis: Secondary | ICD-10-CM | POA: Diagnosis not present

## 2018-12-22 DIAGNOSIS — Z7984 Long term (current) use of oral hypoglycemic drugs: Secondary | ICD-10-CM | POA: Diagnosis not present

## 2018-12-22 DIAGNOSIS — G620 Drug-induced polyneuropathy: Secondary | ICD-10-CM | POA: Diagnosis not present

## 2018-12-22 DIAGNOSIS — E1136 Type 2 diabetes mellitus with diabetic cataract: Secondary | ICD-10-CM | POA: Diagnosis not present

## 2018-12-22 DIAGNOSIS — D509 Iron deficiency anemia, unspecified: Secondary | ICD-10-CM

## 2018-12-22 DIAGNOSIS — Z79899 Other long term (current) drug therapy: Secondary | ICD-10-CM | POA: Diagnosis not present

## 2018-12-22 DIAGNOSIS — Z95828 Presence of other vascular implants and grafts: Secondary | ICD-10-CM

## 2018-12-22 DIAGNOSIS — Z5111 Encounter for antineoplastic chemotherapy: Secondary | ICD-10-CM | POA: Diagnosis not present

## 2018-12-22 DIAGNOSIS — E039 Hypothyroidism, unspecified: Secondary | ICD-10-CM | POA: Diagnosis not present

## 2018-12-22 DIAGNOSIS — R600 Localized edema: Secondary | ICD-10-CM | POA: Diagnosis not present

## 2018-12-22 DIAGNOSIS — Z90722 Acquired absence of ovaries, bilateral: Secondary | ICD-10-CM | POA: Diagnosis not present

## 2018-12-22 DIAGNOSIS — C772 Secondary and unspecified malignant neoplasm of intra-abdominal lymph nodes: Secondary | ICD-10-CM | POA: Diagnosis not present

## 2018-12-22 DIAGNOSIS — E785 Hyperlipidemia, unspecified: Secondary | ICD-10-CM | POA: Diagnosis not present

## 2018-12-22 DIAGNOSIS — D519 Vitamin B12 deficiency anemia, unspecified: Secondary | ICD-10-CM

## 2018-12-22 DIAGNOSIS — J309 Allergic rhinitis, unspecified: Secondary | ICD-10-CM | POA: Diagnosis not present

## 2018-12-22 DIAGNOSIS — Z90710 Acquired absence of both cervix and uterus: Secondary | ICD-10-CM | POA: Diagnosis not present

## 2018-12-22 DIAGNOSIS — Z7982 Long term (current) use of aspirin: Secondary | ICD-10-CM | POA: Diagnosis not present

## 2018-12-22 LAB — CBC WITH DIFFERENTIAL/PLATELET
Abs Immature Granulocytes: 0.01 10*3/uL (ref 0.00–0.07)
Basophils Absolute: 0.1 10*3/uL (ref 0.0–0.1)
Basophils Relative: 1 %
Eosinophils Absolute: 1.1 10*3/uL — ABNORMAL HIGH (ref 0.0–0.5)
Eosinophils Relative: 18 %
HCT: 34.8 % — ABNORMAL LOW (ref 36.0–46.0)
Hemoglobin: 11.5 g/dL — ABNORMAL LOW (ref 12.0–15.0)
Immature Granulocytes: 0 %
Lymphocytes Relative: 15 %
Lymphs Abs: 0.9 10*3/uL (ref 0.7–4.0)
MCH: 37.3 pg — ABNORMAL HIGH (ref 26.0–34.0)
MCHC: 33 g/dL (ref 30.0–36.0)
MCV: 113 fL — ABNORMAL HIGH (ref 80.0–100.0)
Monocytes Absolute: 0.6 10*3/uL (ref 0.1–1.0)
Monocytes Relative: 9 %
Neutro Abs: 3.6 10*3/uL (ref 1.7–7.7)
Neutrophils Relative %: 57 %
Platelets: 167 10*3/uL (ref 150–400)
RBC: 3.08 MIL/uL — ABNORMAL LOW (ref 3.87–5.11)
RDW: 14.1 % (ref 11.5–15.5)
WBC: 6.3 10*3/uL (ref 4.0–10.5)
nRBC: 0 % (ref 0.0–0.2)

## 2018-12-22 LAB — COMPREHENSIVE METABOLIC PANEL
ALT: 29 U/L (ref 0–44)
AST: 26 U/L (ref 15–41)
Albumin: 3.6 g/dL (ref 3.5–5.0)
Alkaline Phosphatase: 51 U/L (ref 38–126)
Anion gap: 8 (ref 5–15)
BUN: 22 mg/dL (ref 8–23)
CO2: 30 mmol/L (ref 22–32)
Calcium: 9.7 mg/dL (ref 8.9–10.3)
Chloride: 97 mmol/L — ABNORMAL LOW (ref 98–111)
Creatinine, Ser: 0.64 mg/dL (ref 0.44–1.00)
GFR calc Af Amer: >60 mL/min (ref >60)
GFR calc non Af Amer: >60 mL/min (ref >60)
Glucose, Bld: 193 mg/dL — ABNORMAL HIGH (ref 70–99)
Potassium: 4.1 mmol/L (ref 3.5–5.1)
Sodium: 135 mmol/L (ref 135–145)
Total Bilirubin: 0.4 mg/dL (ref 0.3–1.2)
Total Protein: 7.3 g/dL (ref 6.5–8.1)

## 2018-12-22 LAB — VITAMIN B12: Vitamin B-12: 469 pg/mL (ref 180–914)

## 2018-12-22 LAB — IRON AND TIBC
Iron: 44 ug/dL (ref 28–170)
Saturation Ratios: 13 % (ref 10.4–31.8)
TIBC: 328 ug/dL (ref 250–450)
UIBC: 284 ug/dL

## 2018-12-22 LAB — FERRITIN: Ferritin: 144 ng/mL (ref 11–307)

## 2018-12-22 MED ORDER — FAMOTIDINE IN NACL 20-0.9 MG/50ML-% IV SOLN
20.0000 mg | Freq: Once | INTRAVENOUS | Status: AC
  Administered 2018-12-22: 20 mg via INTRAVENOUS
  Filled 2018-12-22: qty 50

## 2018-12-22 MED ORDER — SODIUM CHLORIDE 0.9 % IV SOLN
Freq: Once | INTRAVENOUS | Status: AC
  Administered 2018-12-22: 11:00:00 via INTRAVENOUS
  Filled 2018-12-22: qty 5

## 2018-12-22 MED ORDER — SODIUM CHLORIDE 0.9 % IV SOLN
Freq: Once | INTRAVENOUS | Status: AC
  Administered 2018-12-22: 11:00:00 via INTRAVENOUS
  Filled 2018-12-22: qty 250

## 2018-12-22 MED ORDER — PALONOSETRON HCL INJECTION 0.25 MG/5ML
0.2500 mg | Freq: Once | INTRAVENOUS | Status: AC
  Administered 2018-12-22: 0.25 mg via INTRAVENOUS
  Filled 2018-12-22: qty 5

## 2018-12-22 MED ORDER — DIPHENHYDRAMINE HCL 50 MG/ML IJ SOLN
50.0000 mg | Freq: Once | INTRAMUSCULAR | Status: AC
  Administered 2018-12-22: 50 mg via INTRAVENOUS
  Filled 2018-12-22: qty 1

## 2018-12-22 MED ORDER — PEGFILGRASTIM 6 MG/0.6ML ~~LOC~~ PSKT
6.0000 mg | PREFILLED_SYRINGE | Freq: Once | SUBCUTANEOUS | Status: AC
  Administered 2018-12-22: 6 mg via SUBCUTANEOUS
  Filled 2018-12-22: qty 0.6

## 2018-12-22 MED ORDER — SODIUM CHLORIDE 0.9 % IV SOLN
390.0000 mg | Freq: Once | INTRAVENOUS | Status: AC
  Administered 2018-12-22: 390 mg via INTRAVENOUS
  Filled 2018-12-22: qty 39

## 2018-12-22 MED ORDER — SODIUM CHLORIDE 0.9 % IV SOLN
160.0000 mg/m2 | Freq: Once | INTRAVENOUS | Status: AC
  Administered 2018-12-22: 246 mg via INTRAVENOUS
  Filled 2018-12-22: qty 41

## 2018-12-22 MED ORDER — SODIUM CHLORIDE 0.9% FLUSH
10.0000 mL | Freq: Once | INTRAVENOUS | Status: AC
  Filled 2018-12-22: qty 10

## 2018-12-22 NOTE — Progress Notes (Signed)
[facility]  [phone] [phone]   Name: Meredith Jones Date: 12/22/2018 MRN: [id number]  DOB: 07/13/1955  Patient Care Team: Mar Daring, PA-C as PCP - General (Family Medicine) Clent Jacks, RN as Oncology Nurse Navigator    REASON FOR CONSULTATION: Palliative Care consult requested for this 63 y.o. 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  Abdominal CT revealed cystic lesions in the bilateral ovaries with numerous bulky retroperitoneal lymph nodes and a large amount of ascites.  She has required therapeutic paracentesis.  Patient is s/p recent TAH and BSO on 11/26/2018. She is now receiving adjuvant systemic chemotherapy.  Patient was referred to palliative care to help address goals and manage ongoing symptoms.   SOCIAL HISTORY:     reports that she has never smoked. She has never used smokeless tobacco. She reports that she does not drink alcohol or use drugs.   Patient is not married and has no children.  Her parents are deceased and she has no siblings.  She lives at home with a boyfriend.  Patient previously worked as a Scientist, water quality at [employer].  ADVANCE DIRECTIVES:  Does not have  CODE STATUS: Full Code (MOST form completed on 10/02/18)  PAST MEDICAL HISTORY: Past Medical History:  Diagnosis Date  . Allergy   . Anemia   . Complication of anesthesia   . Diabetes mellitus without complication ([place])   . Diverticulosis   . Family history of adverse reaction to anesthesia    mom-n/v  . Family history of lung cancer   . GERD (gastroesophageal reflux disease)    h/o  . Hyperlipidemia   . Hypothyroidism   . Ovarian cancer ([place]) 08/04/2018   Chemo tx's.  Marland Kitchen PONV (postoperative nausea and vomiting)    sick with tonsillectomy  . Thyroid disease     PAST SURGICAL HISTORY:  Past Surgical History:  Procedure  Laterality Date  . BACK SURGERY  2007   Lumbar Surgery  . BREAST CYST ASPIRATION Right    Around 10 years ago. Pt thinks it was the right breast but not sure.  Marland Kitchen CATARACT EXTRACTION Left 2010   Right Eye 2011  . COLONOSCOPY WITH PROPOFOL N/A 09/02/2015   Procedure: COLONOSCOPY WITH PROPOFOL;  Surgeon: [name], MD;  Location: [facility];  Service: Endoscopy;  Laterality: N/A;  Diabetic - oral meds  . ESOPHAGOGASTRODUODENOSCOPY (EGD) WITH PROPOFOL N/A 11/03/2015   Procedure: ESOPHAGOGASTRODUODENOSCOPY (EGD) WITH PROPOFOL;  Surgeon: [name], MD;  Location: [facility];  Service: Endoscopy;  Laterality: N/A;  . EYE SURGERY Bilateral 06/2014    tear duck catherization ( Dr. Vallarie Mare)  . LAPAROSCOPIC APPENDECTOMY  11/26/2018   Procedure: APPENDECTOMY LAPAROSCOPIC;  Surgeon: Ward, Honor Loh, MD;  Location: [facility];  Service: Gynecology;;  . POLYPECTOMY  09/02/2015   Procedure: POLYPECTOMY;  Surgeon: [name], MD;  Location: [facility];  Service: Endoscopy;;  . TONSILLECTOMY  1963    HEMATOLOGY/ONCOLOGY HISTORY:  Oncology History  Serous adenocarcinoma ([place])  08/13/2018 Initial Diagnosis   Serous adenocarcinoma ([place])   08/14/2018 Cancer Staging   Staging form: Exocrine Pancreas, AJCC 8th Edition - Clinical stage from 08/14/2018: Stage III (cT3, cN2, cM0) - Signed by Sindy Guadeloupe, MD on 08/17/2018   08/21/2018 -  Chemotherapy   The patient had palonosetron (ALOXI) injection 0.25 mg, 0.25 mg, Intravenous,  Once, 5 of 6 cycles Administration: 0.25  mg (08/21/2018), 0.25 mg (09/11/2018), 0.25 mg (10/02/2018), 0.25 mg (10/23/2018) pegfilgrastim (NEULASTA ONPRO KIT) injection 6 mg, 6 mg, Subcutaneous, Once, 5 of 6 cycles Administration: 6 mg (08/21/2018), 6 mg (09/11/2018), 6 mg (10/02/2018), 6 mg (10/23/2018) bevacizumab (AVASTIN) 800 mg in sodium chloride 0.9 % 100 mL chemo infusion, 15 mg/kg = 800 mg (100 % of original dose 15 mg/kg), Intravenous,  Once, 1 of 1 cycle Dose  modification: 15 mg/kg (original dose 15 mg/kg, Cycle 1) Administration: 800 mg (08/21/2018) CARBOplatin (PARAPLATIN) 430 mg in sodium chloride 0.9 % 250 mL chemo infusion, 430 mg (100 % of original dose 429.5 mg), Intravenous,  Once, 5 of 6 cycles Dose modification:   (original dose 429.5 mg, Cycle 1) Administration: 430 mg (08/21/2018), 430 mg (09/11/2018), 430 mg (10/02/2018), 430 mg (10/23/2018) PACLitaxel (TAXOL) 270 mg in sodium chloride 0.9 % 250 mL chemo infusion (> '80mg'$ /m2), 175 mg/m2 = 270 mg, Intravenous,  Once, 5 of 6 cycles Dose modification: 160 mg/m2 (original dose 175 mg/m2, Cycle 5, Reason: Other (see comments), Comment: neuropathy) Administration: 270 mg (08/21/2018), 270 mg (09/11/2018), 270 mg (10/02/2018), 270 mg (10/23/2018) fosaprepitant (EMEND) 150 mg, dexamethasone (DECADRON) 12 mg in sodium chloride 0.9 % 145 mL IVPB, , Intravenous,  Once, 5 of 6 cycles Administration:  (08/21/2018),  (09/11/2018),  (10/02/2018),  (10/23/2018) bevacizumab-awwb (MVASI) 800 mg in sodium chloride 0.9 % 100 mL chemo infusion, 15 mg/kg = 800 mg (100 % of original dose 15 mg/kg), Intravenous,  Once, 2 of 2 cycles Dose modification: 15 mg/kg (original dose 15 mg/kg, Cycle 2) Administration: 800 mg (09/11/2018), 800 mg (10/02/2018)  for chemotherapy treatment.    11/07/2018 Genetic Testing   Negative germline + somatic genetic testing. No pathogenic variants identified on the [facility] panel and [facility] HRD. The [facility] gene panel offered by [employer] includes sequencing and deletion/duplication testing of the following 35 genes: APC, ATM, AXIN2, BARD1, BMPR1A, BRCA1, BRCA2, BRIP1, CHD1, CDK4, CDKN2A, CHEK2, EPCAM (large rearrangement only), HOXB13, GALNT12, MLH1, MSH2, MSH3, MSH6, MUTYH, NBN, NTHL1, PALB2, PMS2, PTEN, RAD51C, RAD51D, RNF43, RPS20, SMAD4, STK11, and TP53. Sequencing was performed for select regions of POLE and POLD1, and large rearrangement analysis was performed for  select regions of GREM1. The report date is 11/07/2018.      ALLERGIES:  is allergic to clarithromycin; doxycycline; and penicillins.  MEDICATIONS:  Current Outpatient Medications  Medication Sig Dispense Refill  . acetaminophen (TYLENOL) 500 MG tablet Take 2 tablets (1,000 mg total) by mouth every 6 (six) hours.    Marland Kitchen aspirin 81 MG tablet Take 81 mg by mouth daily.     . calcipotriene (DOVONOX) 0.005 % ointment Apply 1 application topically as needed. CALCIPOTRIENE, 0.005% (External Ointment) - Historical Medication  apply to skin daily (0.005 %) Active    . dexamethasone (DECADRON) 4 MG tablet Take 2 tablets (8 mg total) by mouth daily. Start the day after carboplatin chemotherapy for 3 days. 30 tablet 1  . fexofenadine (ALLEGRA) 60 MG tablet Take 60 mg by mouth daily as needed for allergies.     . furosemide (LASIX) 20 MG tablet Take 1 tablet (20 mg total) by mouth daily. (Patient not taking: Reported on 12/19/2018) 20 tablet 0  . gabapentin (NEURONTIN) 300 MG capsule Take 1 capsule (300 mg total) by mouth at bedtime. 30 capsule 0  . glipiZIDE (GLUCOTROL) 5 MG tablet TAKE 1 TABLET BY MOUTH DAILY BEFORE BREAKFAST (Patient taking differently: Take 5 mg by mouth daily before breakfast. )  90 tablet 1  . glucose blood (ONE TOUCH ULTRA TEST) test strip USE TO TEST BLOOD SUGAR TWICE A DAY AS INSTRUCTED 100 each 12  . ibuprofen (ADVIL) 600 MG tablet Take 1 tablet (600 mg total) by mouth every 6 (six) hours. 45 tablet 1  . levothyroxine (SYNTHROID) 100 MCG tablet Take 1 tablet (100 mcg total) by mouth daily. (Patient taking differently: Take 100 mcg by mouth at bedtime. ) 90 tablet 1  . LORazepam (ATIVAN) 0.5 MG tablet Take 1 tablet (0.5 mg total) by mouth every 6 (six) hours as needed (Nausea or vomiting). (Patient not taking: Reported on 11/26/2018) 30 tablet 0  . metFORMIN (GLUCOPHAGE) 500 MG tablet Take 2 tablets (1,000 mg total) by mouth 2 (two) times daily. 360 tablet 0  . MULTIPLE VITAMIN PO Take  1 tablet by mouth daily.     . ondansetron (ZOFRAN) 8 MG tablet Take 1 tablet (8 mg total) by mouth 2 (two) times daily as needed for refractory nausea / vomiting. Start on day 3 after carboplatin chemo. (Patient not taking: Reported on 12/19/2018) 30 tablet 1  . ONETOUCH DELICA LANCETS 67Y MISC USE TO TEST BLOOD SUGAR ONCE A DAY 100 each 12  . oxyCODONE (ROXICODONE) 5 MG immediate release tablet Take 1 tablet (5 mg total) by mouth every 8 (eight) hours as needed. 16 tablet 0  . prochlorperazine (COMPAZINE) 10 MG tablet Take 1 tablet (10 mg total) by mouth every 6 (six) hours as needed (Nausea or vomiting). (Patient not taking: Reported on 11/21/2018) 30 tablet 1  . simvastatin (ZOCOR) 20 MG tablet Take 1 tablet (20 mg total) by mouth at bedtime. 90 tablet 1  . spironolactone (ALDACTONE) 50 MG tablet Take 1 tablet (50 mg total) by mouth daily. (Patient not taking: Reported on 12/19/2018) 20 tablet 0   No current facility-administered medications for this visit.    Facility-Administered Medications Ordered in Other Visits  Medication Dose Route Frequency Provider Last Rate Last Dose  . CARBOplatin (PARAPLATIN) 390 mg in sodium chloride 0.9 % 250 mL chemo infusion  390 mg Intravenous Once Sindy Guadeloupe, MD      . PACLitaxel (TAXOL) 246 mg in sodium chloride 0.9 % 250 mL chemo infusion (> 83m/m2)  160 mg/m2 (Treatment Plan Recorded) Intravenous Once RSindy Guadeloupe MD 97 mL/hr at 12/22/18 1156 246 mg at 12/22/18 1156  . pegfilgrastim (NEULASTA ONPRO KIT) injection 6 mg  6 mg Subcutaneous Once RSindy Guadeloupe MD      . sodium chloride flush (NS) 0.9 % injection 10 mL  10 mL Intravenous Once RSindy Guadeloupe MD        VITAL SIGNS: There were no vitals taken for this visit. There were no vitals filed for this visit.  Estimated body mass index is 16.97 kg/m as calculated from the following:   Height as of 11/21/18: _0  (1.651 m).   Weight as of an earlier encounter on 12/22/18: 102 lb (46.3 kg).   LABS: CBC:    Component Value Date/Time   WBC 6.3 12/22/2018 0905   HGB 11.5 (L) 12/22/2018 0905   HGB 10.2 (L) 05/31/2017 0836   HCT 34.8 (L) 12/22/2018 0905   HCT 31.4 (L) 05/31/2017 0836   PLT 167 12/22/2018 0905   PLT 374 05/31/2017 0836   MCV 113.0 (H) 12/22/2018 0905   MCV 100 (H) 05/31/2017 0836   NEUTROABS 3.6 12/22/2018 0905   NEUTROABS 3.4 05/31/2017 0836   LYMPHSABS 0.9 12/22/2018 01950  LYMPHSABS 1.0 05/31/2017 0836   MONOABS 0.6 12/22/2018 0905   EOSABS 1.1 (H) 12/22/2018 0905   EOSABS 0.4 05/31/2017 0836   BASOSABS 0.1 12/22/2018 0905   BASOSABS 0.0 05/31/2017 0836   Comprehensive Metabolic Panel:    Component Value Date/Time   NA 135 12/22/2018 0905   NA 142 05/31/2017 0836   K 4.1 12/22/2018 0905   CL 97 (L) 12/22/2018 0905   CO2 30 12/22/2018 0905   BUN 22 12/22/2018 0905   BUN 9 05/31/2017 0836   CREATININE 0.64 12/22/2018 0905   CREATININE 0.57 11/13/2016 0933   GLUCOSE 193 (H) 12/22/2018 0905   CALCIUM 9.7 12/22/2018 0905   AST 26 12/22/2018 0905   ALT 29 12/22/2018 0905   ALKPHOS 51 12/22/2018 0905   BILITOT 0.4 12/22/2018 0905   BILITOT <0.2 05/31/2017 0836   PROT 7.3 12/22/2018 0905   PROT 6.1 05/31/2017 0836   ALBUMIN 3.6 12/22/2018 0905   ALBUMIN 3.4 (L) 05/31/2017 0836    RADIOGRAPHIC STUDIES: No results found.  PERFORMANCE STATUS (ECOG) : 0 - Asymptomatic  Review of Systems Unless otherwise noted, a complete review of systems is negative.  Physical Exam General: NAD,  thin Pulmonary: Unlabored breathing, CTA ant/post fields Cards: RRR Abdomen: soft, nttp Extremities: trace lower extremity edema Skin: no rashes Neurological: Weakness but otherwise nonfocal  IMPRESSION: Patient was seen in the infusion area while she was receiving treatment.  Patient reports that she is doing well.  She denies any acute changes or concerns.  She denies any distressing symptoms today.  Patient reports that her appetite has been stable but I  do note a slight decrease in weight over the past 2 weeks.  She is down to 102 pounds from 105 pounds.  Encouraged oral supplements.  She is followed by dietitian.  Will continue to follow  PLAN: -Continue current scope of treatment -Follow up telephone visit in 1 month   Patient expressed understanding and was in agreement with this plan. She also understands that She can call the clinic at any time with any questions, concerns, or complaints.     Time Total: 10 minutes  Visit consisted of counseling and education dealing with the complex and emotionally intense issues of symptom management and palliative care in the setting of serious and potentially life-threatening illness.Greater than 50%  of this time was spent counseling and coordinating care related to the above assessment and plan.  Signed by: Altha Harm, PhD, NP-C [phone] (Work Cell)

## 2018-12-22 NOTE — Progress Notes (Signed)
Ok to change Carbo dose due to weight change per MD.

## 2018-12-22 NOTE — Progress Notes (Signed)
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  Patient has completed neoadjuvant chemotherapy.  S/p surgery on 10/14.  Patient receiving adjuvant chemotherapy.    Met with patient during infusion this pm.  Patient continues to report that her appetite is good. Reports that she is trying to eat more protein and carbohydrate.  Has added veggie griller to grilled cheese sandwich. Eating more nuts and peanut butter.     Medications: reviewed  Labs: glucose 193  Anthropometrics:   Weight 102 lb today decreased from 105 lb on 10/28 and 106 lb on 10/9.  Weight continues to decline   NUTRITION DIAGNOSIS: Inadequate oral intake continues   INTERVENTION:  Discussed again with patient the importance of increasing calories and protein to prevent weight from decreasing.  Patient is limiting intake due to fear of elevated blood glucose.   We talked about factors that effect blood glucose.  Recommend patient consume 3 well-balanced meals and 2 snacks daily Patient has contact information    MONITORING, EVALUATION, GOAL: Patient will consume adequate calories and protein to prevent weight loss   NEXT VISIT: as needed   Fleurette Woolbright B. Zenia Resides, [place], [place] Registered Dietitian [phone] (pager)

## 2018-12-23 ENCOUNTER — Telehealth: Payer: Self-pay | Admitting: *Deleted

## 2018-12-23 MED ORDER — GABAPENTIN 300 MG PO CAPS: 300.0000 mg | ORAL_CAPSULE | Freq: Every day | ORAL | 0 refills | Status: DC

## 2018-12-23 NOTE — Telephone Encounter (Signed)
Pharmacy called and states that they need clarification on Gabapentin prescription sent this morning

## 2018-12-23 NOTE — Telephone Encounter (Signed)
Called pharmacist-Dawn to clarify patient's dosage. Dawn understood and had no further questions.

## 2018-12-23 NOTE — Progress Notes (Addendum)
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 She presented with symptoms of bilateral lower extremity swelling as well as abdominal distention.GI. This was followed by a CT abdomen and chest imaging with contrast. CT showed cystic lesions of the enlarged bilateral ovaries the largest lesion on the left measuring 6.2 cm. Patient also noted to have numerous bulky retroperitoneal lymph nodes, largest left periaortic node or conglomerates near the superior mesenteric artery measuring at least 4.4 x 2.9 cm. Significant ascites was present. Patient underwent paracentesis and cytology from the fluid showed high-grade serous carcinoma. Tumor cells were positive for CK7, PAX 8, p53 and WT 1.  Patient started neoadjuvant carbotaxol and Avastin chemotherapy on 08/21/2018 and completed 4 cycles on 11/02/2018.  Patient did not receive Avastin for cycle 4.  Pathology: DIAGNOSIS:  A. UTERUS AND CERVIX WITH BILATERAL FALLOPIAN TUBES AND OVARIES;  HYSTERECTOMY WITH BILATERAL SALPINGO-OOPHORECTOMY:  - HIGH-GRADE SEROUS ADENOCARCINOMA.  - REFER TO CANCER SUMMARY BELOW.   CANCER CASE SUMMARY: OVARY or FALLOPIAN TUBE or PRIMARY PERITONEUM  Procedure: Total hysterectomy with bilateral salpingo-oophorectomy  Specimen Integrity: Intact  Tumor Site: Bilateral ovaries and  fallopian tubes  Ovarian Surface Involvement: Present, bilateral  Fallopian Tube Surface Involvement: Present, left  Tumor Size: Greatest dimension 1.2 cm  Histologic Type: Serous carcinoma  Histologic Grade: High grade  Implants: N/A  Other Tissue/ Organ Involvement: Appendix, parametrium  Largest Extrapelvic Peritoneal Focus: Microscopic  Peritoneal/Ascitic Fluid: Positive for malignancy  Treatment Effect: Moderate response; see comment.  Regional Lymph Nodes: No nodes submitted or found  Pathologic Stage Classification (pTNM, AJCC 8th Edition): pT3a pNX pM1b  FIGO: IVB  TNM Descriptors: N/A   Comment:  Chemotherapy Response Score (CRS) is assigned based on evaluation of tumor response in the omentum, which is not submitted in this specimen. In the submitted tissue, there is a partial/moderate tumor response.   B. APPENDIX, APPENDECTOMY:  - TRANSMURAL INVOLVEMENT BY HIGH-GRADE SEROUS ADENOCARCINOMA.  Interval history-patient has recovered from surgery well.  She reports mild fatigue.  Complains of mild tingling numbness in her bilateral feet and pain in her right leg which at times makes it difficult for her to walk.  ECOG PS- 1 Pain scale- 0 Opioid associated constipation- no  Review of systems- Review of Systems  Constitutional: Positive for malaise/fatigue. Negative for chills, fever and weight loss.  HENT: Negative for congestion, ear discharge and nosebleeds.   Eyes: Negative for blurred vision.  Respiratory: Negative for cough, hemoptysis, sputum production, shortness of breath and wheezing.   Cardiovascular: Negative for chest pain, palpitations, orthopnea and claudication.  Gastrointestinal: Negative for abdominal pain, blood in stool, constipation, diarrhea, heartburn, melena, nausea and vomiting.  Genitourinary: Negative for dysuria, flank pain, frequency, hematuria and urgency.  Musculoskeletal: Negative for back pain, joint pain and myalgias.       Right leg pain   Skin: Negative for rash.  Neurological: Positive for sensory change (Peripheral neuropathy). Negative for dizziness, tingling, focal weakness, seizures,  weakness and headaches.  Endo/Heme/Allergies: Does not bruise/bleed easily.  Psychiatric/Behavioral: Negative for depression and suicidal ideas. The patient does not have insomnia.        Allergies  Allergen Reactions  . Clarithromycin Nausea Only  . Doxycycline Nausea Only    dizziness  . Penicillins Hives    Did it involve swelling of the face/tongue/throat, SOB, or low BP? No Did it involve sudden or severe rash/hives, skin peeling, or any reaction on the inside of your mouth or nose? No Did you need to seek medical attention at a hospital or doctor's office? No When did it last happen?50+ years ago If all above answers are "NO", may proceed with cephalosporin use.      Past Medical History:  Diagnosis Date  . Allergy   . Anemia   . Complication of anesthesia   . Diabetes mellitus without complication ([place])   . Diverticulosis   . Family history of adverse reaction to anesthesia    mom-n/v  . Family history of lung cancer   . GERD (gastroesophageal reflux disease)    h/o  . Hyperlipidemia   . Hypothyroidism   . Ovarian cancer ([place]) 08/04/2018   Chemo tx's.  Marland Kitchen PONV (postoperative nausea and vomiting)    sick with tonsillectomy  . Thyroid disease      Past Surgical History:  Procedure Laterality Date  . BACK SURGERY  2007   Lumbar Surgery  . BREAST CYST ASPIRATION Right    Around 10 years ago. Pt thinks it was the right breast but not sure.  Marland Kitchen CATARACT EXTRACTION Left 2010   Right Eye 2011  . COLONOSCOPY WITH PROPOFOL N/A 09/02/2015   Procedure: COLONOSCOPY WITH PROPOFOL;  Surgeon: [name], MD;  Location: [place];  Service: Endoscopy;  Laterality: N/A;  Diabetic - oral meds  . ESOPHAGOGASTRODUODENOSCOPY (EGD) WITH PROPOFOL N/A 11/03/2015   Procedure: ESOPHAGOGASTRODUODENOSCOPY (EGD) WITH  PROPOFOL;  Surgeon: [name], MD;  Location: [place];  Service: Endoscopy;  Laterality: N/A;  . EYE SURGERY Bilateral 06/2014    tear duck catherization ( Dr. Vallarie Mare)  . LAPAROSCOPIC APPENDECTOMY  11/26/2018   Procedure: APPENDECTOMY LAPAROSCOPIC;  Surgeon: Ward, Honor Loh, MD;  Location: [facility];  Service: Gynecology;;  . POLYPECTOMY  09/02/2015   Procedure: POLYPECTOMY;  Surgeon: [name], MD;  Location: [place];  Service: Endoscopy;;  . TONSILLECTOMY  1963    Social History   Socioeconomic History  . Marital status: Single    Spouse name: Not on file  . Number of children: Not on file  . Years of education: Not on file  . Highest education level: Not on file  Occupational History  . Not on file  Social Needs  . Financial resource strain: Not on file  . Food insecurity    Worry: Not on file    Inability: Not on file  . Transportation needs    Medical: Not on file    Non-medical: Not on file  Tobacco Use  . Smoking status: Never Smoker  . Smokeless tobacco: Never Used  Substance and Sexual Activity  . Alcohol use: No  . Drug use: No  . Sexual activity: Not on file  Lifestyle  . Physical activity    Days per week: Not on file    Minutes per session: Not on file  . Stress: Not on file  Relationships  . Social Herbalist on phone: Not on file    Gets together: Not  on file    Attends religious service: Not on file    Active member of club or organization: Not on file    Attends meetings of clubs or organizations: Not on file    Relationship status: Not on file  . Intimate partner violence    Fear of current or ex partner: Not on file    Emotionally abused: Not on file    Physically abused: Not on file    Forced sexual activity: Not on file  Other Topics Concern  . Not on file  Social History Narrative  . Not on file    Family History  Problem Relation Age of Onset  . Congestive Heart Failure Mother   . Diabetes  Mother   . Stroke Mother   . Hypothyroidism Father   . Cancer Father        lung cancer  . Cancer Paternal Grandmother        stomach     Current Outpatient Medications:  .  acetaminophen (TYLENOL) 500 MG tablet, Take 2 tablets (1,000 mg total) by mouth every 6 (six) hours., Disp: , Rfl:  .  calcipotriene (DOVONOX) 0.005 % ointment, Apply 1 application topically as needed. CALCIPOTRIENE, 0.005% (External Ointment) - Historical Medication  apply to skin daily (0.005 %) Active, Disp: , Rfl:  .  dexamethasone (DECADRON) 4 MG tablet, Take 2 tablets (8 mg total) by mouth daily. Start the day after carboplatin chemotherapy for 3 days., Disp: 30 tablet, Rfl: 1 .  gabapentin (NEURONTIN) 300 MG capsule, Take 1 capsule (300 mg total) by mouth at bedtime. Take 2 tablets at bedtime once and then 3 tablets for three nights at bedtime., Disp: 11 capsule, Rfl: 0 .  glipiZIDE (GLUCOTROL) 5 MG tablet, TAKE 1 TABLET BY MOUTH DAILY BEFORE BREAKFAST (Patient taking differently: Take 5 mg by mouth daily before breakfast. ), Disp: 90 tablet, Rfl: 1 .  glucose blood (ONE TOUCH ULTRA TEST) test strip, USE TO TEST BLOOD SUGAR TWICE A DAY AS INSTRUCTED, Disp: 100 each, Rfl: 12 .  ibuprofen (ADVIL) 600 MG tablet, Take 1 tablet (600 mg total) by mouth every 6 (six) hours., Disp: 45 tablet, Rfl: 1 .  levothyroxine (SYNTHROID) 100 MCG tablet, Take 1 tablet (100 mcg total) by mouth daily. (Patient taking differently: Take 100 mcg by mouth at bedtime. ), Disp: 90 tablet, Rfl: 1 .  metFORMIN (GLUCOPHAGE) 500 MG tablet, Take 2 tablets (1,000 mg total) by mouth 2 (two) times daily., Disp: 360 tablet, Rfl: 0 .  MULTIPLE VITAMIN PO, Take 1 tablet by mouth daily. , Disp: , Rfl:  .  ONETOUCH DELICA LANCETS 20B MISC, USE TO TEST BLOOD SUGAR ONCE A DAY, Disp: 100 each, Rfl: 12 .  oxyCODONE (ROXICODONE) 5 MG immediate release tablet, Take 1 tablet (5 mg total) by mouth every 8 (eight) hours as needed., Disp: 16 tablet, Rfl: 0 .   simvastatin (ZOCOR) 20 MG tablet, Take 1 tablet (20 mg total) by mouth at bedtime., Disp: 90 tablet, Rfl: 1 .  aspirin 81 MG tablet, Take 81 mg by mouth daily. , Disp: , Rfl:  .  fexofenadine (ALLEGRA) 60 MG tablet, Take 60 mg by mouth daily as needed for allergies. , Disp: , Rfl:  .  furosemide (LASIX) 20 MG tablet, Take 1 tablet (20 mg total) by mouth daily. (Patient not taking: Reported on 12/19/2018), Disp: 20 tablet, Rfl: 0 .  LORazepam (ATIVAN) 0.5 MG tablet, Take 1 tablet (0.5 mg total) by mouth every 6 (  six) hours as needed (Nausea or vomiting). (Patient not taking: Reported on 11/26/2018), Disp: 30 tablet, Rfl: 0 .  ondansetron (ZOFRAN) 8 MG tablet, Take 1 tablet (8 mg total) by mouth 2 (two) times daily as needed for refractory nausea / vomiting. Start on day 3 after carboplatin chemo. (Patient not taking: Reported on 12/19/2018), Disp: 30 tablet, Rfl: 1 .  prochlorperazine (COMPAZINE) 10 MG tablet, Take 1 tablet (10 mg total) by mouth every 6 (six) hours as needed (Nausea or vomiting). (Patient not taking: Reported on 11/21/2018), Disp: 30 tablet, Rfl: 1 .  spironolactone (ALDACTONE) 50 MG tablet, Take 1 tablet (50 mg total) by mouth daily. (Patient not taking: Reported on 12/19/2018), Disp: 20 tablet, Rfl: 0 No current facility-administered medications for this visit.   Facility-Administered Medications Ordered in Other Visits:  .  sodium chloride flush (NS) 0.9 % injection 10 mL, 10 mL, Intravenous, Once, Sindy Guadeloupe, MD  Physical exam:  Vitals:   12/22/18 0931  BP: 111/73  Pulse: 77  Temp: (!) 97.5 F (36.4 C)  TempSrc: Tympanic  Weight: 102 lb (46.3 kg)   Physical Exam Constitutional:      Comments: Thin female in no acute distress  HENT:     Head: Normocephalic and atraumatic.  Eyes:     Pupils: Pupils are equal, round, and reactive to light.  Neck:     Musculoskeletal: Normal range of motion.  Cardiovascular:     Rate and Rhythm: Normal rate and regular rhythm.      Heart sounds: Normal heart sounds.  Pulmonary:     Effort: Pulmonary effort is normal.     Breath sounds: Normal breath sounds.  Abdominal:     General: Bowel sounds are normal.     Palpations: Abdomen is soft.  Musculoskeletal:     Comments: Trace bilateral edema  Skin:    General: Skin is warm and dry.  Neurological:     Mental Status: She is alert and oriented to person, place, and time.      CMP Latest Ref Rng & Units 12/22/2018  Glucose 70 - 99 mg/dL 193(H)  BUN 8 - 23 mg/dL 22  Creatinine 0.44 - 1.00 mg/dL 0.64  Sodium 135 - 145 mmol/L 135  Potassium 3.5 - 5.1 mmol/L 4.1  Chloride 98 - 111 mmol/L 97(L)  CO2 22 - 32 mmol/L 30  Calcium 8.9 - 10.3 mg/dL 9.7  Total Protein 6.5 - 8.1 g/dL 7.3  Total Bilirubin 0.3 - 1.2 mg/dL 0.4  Alkaline Phos 38 - 126 U/L 51  AST 15 - 41 U/L 26  ALT 0 - 44 U/L 29   CBC Latest Ref Rng & Units 12/22/2018  WBC 4.0 - 10.5 K/uL 6.3  Hemoglobin 12.0 - 15.0 g/dL 11.5(L)  Hematocrit 36.0 - 46.0 % 34.8(L)  Platelets 150 - 400 K/uL 167     Assessment and plan- Patient is a 63 y.o. 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  Avastin was held for cycle 4.  She is s/p robotic assisted total laparoscopic hysterectomy and bilateral salpingo-oophorectomy and appendectomy with extensive lysis of adhesions and abdomen debulking.  She is here for on treatment assessment prior to cycle 5 of carbotaxol chemotherapy  Plan is to give her 3 cycles of adjuvant carbotaxol chemotherapy and this is her cycle 1.  I will be holding the Avastin with this cycle.  I will plan to give  her a Avastin for the remainder of the 2 cycles.  Counts are okay to proceed with chemotherapy today with ongoing Neulasta support.  I will see her back in 3 weeks time with CBC with differential, CMP and CA-125 for cycle 6 of carbotaxol Avastin chemotherapy  Iron deficiency and  B12 deficiency anemia: She is s/p 5 doses of Venofer as well as B12 supplements.  Iron and B12 levels are now normal and her hemoglobin is improved to 11.5.  I will plan to complete 3 cycles of adjuvant chemotherapy and discussed risks versus benefits of maintenance Avastin at that time  Chemo induced peripheral neuropathy. Gradually increase neurontin to 300 mg TID. She is presently on 300 mg QHS   Visit Diagnosis 1. Serous adenocarcinoma ([place])   2. Encounter for antineoplastic chemotherapy   3. Iron deficiency anemia, unspecified iron deficiency anemia type   4. Anemia due to vitamin B12 deficiency, unspecified B12 deficiency type      Dr. Randa Evens, MD, MPH [facility] [phone] 12/23/2018 8:47 AM

## 2018-12-30 ENCOUNTER — Ambulatory Visit
Admission: RE | Admit: 2018-12-30 | Discharge: 2018-12-30 | Disposition: A | Discharge status: Home / Self Care | Payer: Commercial Managed Care - PPO | Source: Ambulatory Visit | Attending: Oncology | Admitting: Oncology

## 2018-12-30 ENCOUNTER — Other Ambulatory Visit: Payer: Self-pay

## 2018-12-30 DIAGNOSIS — Z1231 Encounter for screening mammogram for malignant neoplasm of breast: Secondary | ICD-10-CM | POA: Insufficient documentation

## 2018-12-30 HISTORY — DX: Personal history of antineoplastic chemotherapy: Z92.21

## 2019-01-06 ENCOUNTER — Other Ambulatory Visit: Payer: Self-pay

## 2019-01-06 NOTE — Progress Notes (Signed)
Patient pre screened for office appointment, no questions or concerns today. Patient reminded of upcoming appointment time and date. 

## 2019-01-07 ENCOUNTER — Inpatient Hospital Stay: Payer: Commercial Managed Care - PPO

## 2019-01-07 ENCOUNTER — Other Ambulatory Visit: Payer: Self-pay

## 2019-01-07 ENCOUNTER — Inpatient Hospital Stay (HOSPITAL_BASED_OUTPATIENT_CLINIC_OR_DEPARTMENT_OTHER): Payer: Commercial Managed Care - PPO | Admitting: Obstetrics and Gynecology

## 2019-01-07 VITALS — BP 137/87 | HR 81 | Temp 98.0°F | Resp 16 | Wt 106.9 lb

## 2019-01-07 DIAGNOSIS — Z90722 Acquired absence of ovaries, bilateral: Secondary | ICD-10-CM

## 2019-01-07 DIAGNOSIS — C801 Malignant (primary) neoplasm, unspecified: Secondary | ICD-10-CM

## 2019-01-07 DIAGNOSIS — Z90710 Acquired absence of both cervix and uterus: Secondary | ICD-10-CM

## 2019-01-07 NOTE — Progress Notes (Signed)
Gynecologic Oncology Interval Visit   Referring Provider: Dr. Janese Banks  Chief Complaint: high grade serous adenocarcinoma  Subjective:  Meredith Jones is a 63 y.o. [place] female, diagnosed with high grade serous adenocarcinoma, s/p 4 cycles of carbo-taxol-bevacizumab (bevacizumab held for cycle 4) who returns to clinic today for postoperative evaluation.  On 11/26/2018 she underwent robotic assisted total laparoscopic hysterectomy and bilateral salpingo-oophorectomy, appendectomy, extensive lysis of adhesions including enterolysis and ovariolysis (>45 minutes), and R1 optimal debulking.  Repair of vaginal laceration.  There was miliary disease 1-5 mm, including a small amount to on the transverse colon, scattered throughout the peritoneal and mesenteric surfaces.  Postoperative course was uncomplicated and she was discharged on postop day 1.  She saw Dr. Theora Gianotti on 12/10/2018 for evaluation and review of pathology.  Pelvic was deferred due to close approximation to surgery.  She declined vaginal bleeding which was felt to be expected given procedure vaginal laceration repair.  Pathology: DIAGNOSIS:  A. UTERUS AND CERVIX WITH BILATERAL FALLOPIAN TUBES AND OVARIES;  HYSTERECTOMY WITH BILATERAL SALPINGO-OOPHORECTOMY:  - HIGH-GRADE SEROUS ADENOCARCINOMA.  - REFER TO CANCER SUMMARY BELOW.   CANCER CASE SUMMARY: OVARY or FALLOPIAN TUBE or PRIMARY PERITONEUM  Procedure: Total hysterectomy with bilateral salpingo-oophorectomy  Specimen Integrity: Intact  Tumor Site: Bilateral ovaries and fallopian tubes  Ovarian Surface Involvement: Present, bilateral  Fallopian Tube Surface Involvement: Present, left  Tumor Size: Greatest dimension 1.2 cm  Histologic Type: Serous carcinoma  Histologic Grade: High grade  Implants: N/A  Other Tissue/ Organ Involvement: Appendix, parametrium  Largest Extrapelvic Peritoneal Focus: Microscopic  Peritoneal/Ascitic Fluid: Positive for malignancy  Treatment Effect:  Moderate response; see comment.  Regional Lymph Nodes: No nodes submitted or found  Pathologic Stage Classification (pTNM, AJCC 8th Edition): pT3a pNX pM1b  FIGO: IVB  TNM Descriptors: N/A   Comment:  Chemotherapy Response Score (CRS) is assigned based on evaluation of tumor response in the omentum, which is not submitted in this specimen. In the submitted tissue, there is a partial/moderate tumor response.   B. APPENDIX, APPENDECTOMY:  - TRANSMURAL INVOLVEMENT BY HIGH-GRADE SEROUS ADENOCARCINOMA.  Today, she reports continuing to recover.    Gynecologic Oncology History:  Meredith Jones is a pleasant [place] female, diagnosed with high grade serous adenocarcinoma.   She presented to ER on 07/23/2018 with symptoms of bilateral lower extremity swelling as well as abdominal distention. US abdomen revealed large volume ascites, no overt changes of hepatic cirrhosis and was referred to Dr. Tama Headings. She underwent US paracentesis on 07/29/2018. 5L of fluid was removed.   Pathology:  A. ABDOMINAL FLUID; PARACENTESIS:  - MALIGNANT CELLS PRESENT.  - ADENOCARCINOMA, MOST CONSISTENT WITH HIGH-GRADE SEROUS CARCINOMA.   08/04/2018- CT C/A/P IMPRESSION: 1. There are cystic lesions of the enlarged bilateral ovaries, largest lesion on the left measuring at least 6.2 cm (series 2, image 116, series 6, image 76). Findings are highly concerning for ovarian neoplasm, particularly given presence of otherwise unexplained ascites and abdominal lymphadenopathy.  2. There are numerous bulky retroperitoneal lymph nodes, largest left periaortic nodes or conglomerates near the superior mesenteric artery measuring at least 4.4 x 2.9 cm (series 2, image 64), concerning for metastatic disease, or alternately primary malignancy such as lymphoma.  She saw Dr. Janese Banks on 08/08/2018. Noted to have macrocytic anemia and found to have low B12 levels. TSH- 46.46; FT3 1.1, FT4 0.74. She has seen her endocrinologist and adjusted  her levothyroxine. History of type 2 diabetes. Last hA1c was 6.5 (08/04/2018). CA-125 (08/08/2018)- 491.  She opted for neoadjuvant chemotherapy.  CT C/A/P- 10/16/2018-  1.Demonstrates a marked positive response to therapy with regression of bilateral ovarian lesions and resolution of previously noted retroperitoneal lymphadenopathy. Additionally, previously noted ascites has resolved. 2. No findings to suggest metastatic disease in the thorax. 3. Aortic atherosclerosis, in addition to left anterior descending coronary artery disease. Please note that although the presence of coronary artery calcium documents the presence of coronary artery disease, the severity of this disease and any potential stenosis cannot be assessed on this non-gated CT examination. Assessment for potential risk factor modification, dietary therapy or pharmacologic therapy may be warranted, if clinically indicated.  She received cycle 4 of carbo-Taxol on 10/23/2018.  She received 5 doses of Venofer (10/23/2018-11/06/2018) and 4 doses of vitamin B12 (10/23/2018-11/06/2018) for anemia.  CA 125 08/08/2018 491.0 09/11/2018 200 10/02/2018 78.5 10/23/2018 31.2  Genetic Testing: MyRisk testing negative for APC, ATM, AXIN2, BARD1, BMPR1A, BRIP1, BRCA1, BRCA2, CDH1, CDK4, CDKN2A, CHEK2, EPCAM (large rearramgement only), HOXB13 (sequencing only), GALNT12, MLH1, MSH2, MSH3 (excluding repetitive portions of exon1) MSH6, MUTYH, NBN, NTHL1, PALB2, PMS2, PTEN, RAD51C, RAD51D, RNF43, SMAD4, STK11, TP53. Sequencing for select regions of POLE and POLD1, and large rearrangement analysis for select regions of GREM1 were negative.  Tumor testing: negative for  BRCA1, BRCA2; GIS negative  Problem List: Patient Active Problem List   Diagnosis Date Noted  . Genetic testing 12/09/2018  . Status post hysterectomy 11/26/2018  . Need for prophylactic vaccination and inoculation against influenza 11/04/2018  . Anemia of chronic disease 10/22/2018  . B12  deficiency 10/22/2018  . Family history of lung cancer   . Serous adenocarcinoma ([place]) 08/13/2018  . Goals of care, counseling/discussion 08/11/2018  . Primary high grade serous adenocarcinoma of ovary ([place]) 08/11/2018  . Loss of weight   . Benign neoplasm of descending colon   . Type 2 diabetes mellitus without complication, without long-term current use of insulin ([place]) 06/07/2015  . 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 Cyst of ovary 06/09/2014  . Bulky or enlarged uterus 06/09/2014  . Hypercholesteremia 02/09/2007  . Allergic rhinitis 11/27/2006  . Endometriosis 11/27/2006  . Adult hypothyroidism 11/27/2006    Past Medical History: Past Medical History:  Diagnosis Date  . Allergy   . Anemia   . Complication of anesthesia   . Diabetes mellitus without complication ([place])   . Diverticulosis   . Family history of adverse reaction to anesthesia    mom-n/v  . Family history of lung cancer   . GERD (gastroesophageal reflux disease)    h/o  . Hyperlipidemia   . Hypothyroidism   . Ovarian cancer ([place]) 08/04/2018   Chemo tx's.  . Personal history of chemotherapy   . PONV (postoperative nausea and vomiting)    sick with tonsillectomy  . Thyroid disease    Past Surgical History: Past Surgical History:  Procedure Laterality Date  . ABDOMINAL HYSTERECTOMY    . BACK SURGERY  2007   Lumbar Surgery  . BREAST CYST ASPIRATION Right    Around 10 years ago. Pt thinks it was the right breast but not sure.  Marland Kitchen CATARACT EXTRACTION Left 2010   Right Eye 2011  . COLONOSCOPY WITH PROPOFOL N/A 09/02/2015   Procedure: COLONOSCOPY WITH PROPOFOL;  Surgeon: [name], MD;  Location: [facility];  Service: Endoscopy;  Laterality: N/A;  Diabetic - oral meds  . ESOPHAGOGASTRODUODENOSCOPY (EGD) WITH PROPOFOL N/A 11/03/2015   Procedure: ESOPHAGOGASTRODUODENOSCOPY (EGD) WITH PROPOFOL;  Surgeon: [name], MD;  Location: [facility];  Service: Endoscopy;  Laterality: N/A;  . EYE  SURGERY Bilateral 06/2014    tear duck catherization ( Dr. Vallarie Mare)  . LAPAROSCOPIC APPENDECTOMY  11/26/2018   Procedure: APPENDECTOMY LAPAROSCOPIC;  Surgeon: Ward, Honor Loh, MD;  Location: [facility];  Service: Gynecology;;  . POLYPECTOMY  09/02/2015   Procedure: POLYPECTOMY;  Surgeon: [name], MD;  Location: [facility];  Service: Endoscopy;;  . TONSILLECTOMY  1963   Past Gynecologic History:  Per HPI  OB History: P0 OB History  No obstetric history on file.   Family History: Family History  Problem Relation Age of Onset  . Congestive Heart Failure Mother   . Diabetes Mother   . Stroke Mother   . Hypothyroidism Father   . Cancer Father        lung cancer  . Cancer Paternal Grandmother        stomach  . Breast cancer Neg Hx     Social History: Social History   Socioeconomic History  . Marital status: Single    Spouse name: Not on file  . Number of children: Not on file  . Years of education: Not on file  . Highest education level: Not on file  Occupational History  . Not on file  Social Needs  . Financial resource strain: Not on file  . Food insecurity    Worry: Not on file    Inability: Not on file  . Transportation needs    Medical: Not on file    Non-medical: Not on file  Tobacco Use  . Smoking status: Never Smoker  . Smokeless tobacco: Never Used  Substance and Sexual Activity  . Alcohol use: No  . Drug use: No  . Sexual activity: Not on file  Lifestyle  . Physical activity    Days per week: Not on file    Minutes per session: Not on file  . Stress: Not on file  Relationships  . Social Herbalist on phone: Not on file    Gets together: Not on file    Attends religious service: Not on file    Active member of club or organization: Not on file    Attends meetings of clubs or organizations: Not on file    Relationship status: Not on file  Other Topics Concern  . Not on file  Social History Narrative  . Not on file     Allergies: Allergies  Allergen Reactions  . Clarithromycin Nausea Only  . Doxycycline Nausea Only    dizziness  . Penicillins Hives    Did it involve swelling of the face/tongue/throat, SOB, or low BP? No Did it involve sudden or severe rash/hives, skin peeling, or any reaction on the inside of your mouth or nose? No Did you need to seek medical attention at a hospital or doctor's office? No When did it last happen?50+ years ago If all above answers are "NO", may proceed with cephalosporin use.     Current Medications: Current Outpatient Medications on File Prior to Visit  Medication Sig Dispense Refill  . acetaminophen (TYLENOL) 500 MG tablet Take 2 tablets (1,000 mg total) by mouth every 6 (six) hours.    Marland Kitchen aspirin 81 MG tablet Take 81 mg by mouth daily.     Marland Kitchen gabapentin (NEURONTIN) 300 MG capsule Take 1 capsule (300 mg total) by mouth at bedtime. Take 2 tablets at bedtime once and then 3 tablets for three nights at bedtime. 11 capsule 0  .  glipiZIDE (GLUCOTROL) 5 MG tablet TAKE 1 TABLET BY MOUTH DAILY BEFORE BREAKFAST (Patient taking differently: Take 5 mg by mouth daily before breakfast. ) 90 tablet 1  . glucose blood (ONE TOUCH ULTRA TEST) test strip USE TO TEST BLOOD SUGAR TWICE A DAY AS INSTRUCTED 100 each 12  . ibuprofen (ADVIL) 600 MG tablet Take 1 tablet (600 mg total) by mouth every 6 (six) hours. 45 tablet 1  . levothyroxine (SYNTHROID) 100 MCG tablet Take 1 tablet (100 mcg total) by mouth daily. (Patient taking differently: Take 100 mcg by mouth at bedtime. ) 90 tablet 1  . metFORMIN (GLUCOPHAGE) 500 MG tablet Take 2 tablets (1,000 mg total) by mouth 2 (two) times daily. 360 tablet 0  . MULTIPLE VITAMIN PO Take 1 tablet by mouth daily.     Glory Rosebush DELICA LANCETS 02H MISC USE TO TEST BLOOD SUGAR ONCE A DAY 100 each 12  . simvastatin (ZOCOR) 20 MG tablet Take 1 tablet (20 mg total) by mouth at bedtime. 90 tablet 1  . calcipotriene (DOVONOX) 0.005 % ointment  Apply 1 application topically as needed. CALCIPOTRIENE, 0.005% (External Ointment) - Historical Medication  apply to skin daily (0.005 %) Active    . dexamethasone (DECADRON) 4 MG tablet Take 2 tablets (8 mg total) by mouth daily. Start the day after carboplatin chemotherapy for 3 days. (Patient not taking: Reported on 01/06/2019) 30 tablet 1  . fexofenadine (ALLEGRA) 60 MG tablet Take 60 mg by mouth daily as needed for allergies.     . furosemide (LASIX) 20 MG tablet Take 1 tablet (20 mg total) by mouth daily. (Patient not taking: Reported on 12/19/2018) 20 tablet 0  . LORazepam (ATIVAN) 0.5 MG tablet Take 1 tablet (0.5 mg total) by mouth every 6 (six) hours as needed (Nausea or vomiting). (Patient not taking: Reported on 11/26/2018) 30 tablet 0  . ondansetron (ZOFRAN) 8 MG tablet Take 1 tablet (8 mg total) by mouth 2 (two) times daily as needed for refractory nausea / vomiting. Start on day 3 after carboplatin chemo. (Patient not taking: Reported on 12/19/2018) 30 tablet 1  . oxyCODONE (ROXICODONE) 5 MG immediate release tablet Take 1 tablet (5 mg total) by mouth every 8 (eight) hours as needed. (Patient not taking: Reported on 01/06/2019) 16 tablet 0  . prochlorperazine (COMPAZINE) 10 MG tablet Take 1 tablet (10 mg total) by mouth every 6 (six) hours as needed (Nausea or vomiting). (Patient not taking: Reported on 11/21/2018) 30 tablet 1  . spironolactone (ALDACTONE) 50 MG tablet Take 1 tablet (50 mg total) by mouth daily. (Patient not taking: Reported on 12/19/2018) 20 tablet 0   Current Facility-Administered Medications on File Prior to Visit  Medication Dose Route Frequency Provider Last Rate Last Dose  . sodium chloride flush (NS) 0.9 % injection 10 mL  10 mL Intravenous Once Sindy Guadeloupe, MD       Review of Systems General:  no complaints Skin: no complaints Eyes: no complaints HEENT: no complaints Breasts: no complaints Pulmonary: no complaints Cardiac: no complaints Gastrointestinal: no  complaints Genitourinary/Sexual: no complaints Ob/Gyn: no complaints Musculoskeletal: no complaints Hematology: no complaints  Objective:  Physical Examination:  Today's Vitals   01/07/19 1404  BP: 137/87  Pulse: 81  Resp: 16  Temp: 98 F (36.7 C)  TempSrc: Tympanic  SpO2: 100%  Weight: 106 lb 14.4 oz (48.5 kg)   Body mass index is 17.79 kg/m.  Performance status: 1  GENERAL: Patient is a well appearing, thin  built, female in no acute distress HEENT:  Sclera clear. Anicteric NODES:  Negative axillary, supraclavicular, inguinal lymph node survery LUNGS:  Clear to auscultation bilaterally.   HEART:  Regular rate and rhythm.  ABDOMEN:  Soft, nontender.  No hernias, incision healing well. No masses or ascites EXTREMITIES:  No peripheral edema. Atraumatic. No cyanosis SKIN:  Clear with no obvious rashes or skin changes.  NEURO:  Nonfocal. Well oriented.  Appropriate affect.  Pelvic:  EGBUS: normal Vagina: healing well Bimanual: no masses  Lab Review Lab Results  Component Value Date   WBC 6.3 12/22/2018   HGB 11.5 (L) 12/22/2018   HCT 34.8 (L) 12/22/2018   MCV 113.0 (H) 12/22/2018   PLT 167 12/22/2018     Chemistry      Component Value Date/Time   NA 135 12/22/2018 0905   NA 142 05/31/2017 0836   K 4.1 12/22/2018 0905   CL 97 (L) 12/22/2018 0905   CO2 30 12/22/2018 0905   BUN 22 12/22/2018 0905   BUN 9 05/31/2017 0836   CREATININE 0.64 12/22/2018 0905   CREATININE 0.57 11/13/2016 0933   GLU 89 03/25/2013      Component Value Date/Time   CALCIUM 9.7 12/22/2018 0905   ALKPHOS 51 12/22/2018 0905   AST 26 12/22/2018 0905   ALT 29 12/22/2018 0905   BILITOT 0.4 12/22/2018 0905   BILITOT <0.2 05/31/2017 0836     Lab Results  Component Value Date   TSH 1.899 11/12/2018     Ref Range & Units 4wk ago  Free T4 0.61 - 1.12 ng/dL 1.33High      Ref Range & Units 4wk ago  T3, Free 2.0 - 4.4 pg/mL 1.4Low        Assessment:  Meredith Jones is a 63 y.o.  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 Bulky retroperitoneal adenopathy adjacent and surrounding superior mesenteric artery s/p neoadjuvant chemotherapy with 3 cycles of carboplatin-paclitaxel-bevacizumab following by cycle #4 with chemotherapy only with excellent response based on CA125, imaging and exam. On 11/26/2018 she underwent robotic assisted total laparoscopic hysterectomy, bilateral salpingo-oophorectomy, appendectomy, extensive lysis of adhesions including enterolysis and ovariolysis (>45 minutes), and R1 optimal debulking.  Normal post op exam.  Mild anemia. History of hypoalbuminemia, improved.  Hyponatremia, postop has resolved.  Abnormal TFTs followed by Dr. Gabriel Carina, Endocrinology.   Medical co-morbidities complicating care: Diabetes mellitus without complication ([place]), Hyperlipidemia, Hypothyroid disease. Body mass index is 18.95 kg/m.  Plan:  She is about 6 weeks post op and now stronger and ready to restart chemotherapy with Dr. Janese Banks after optimal interval cytoreduction.  Plan is for 3 additional cycles of carbo/taxol chemotherapy. Dr. Janese Banks will see her next week to discuss the role of adding bevacizumab to chemotherapy followed bevacizumab maintenance, or active surveillance after chemotherapy. Maintenance PARP inhibitor also an option but she does not have BRCA mutation or HRD, so benefit unlikely to be great.  There are benefits and risks to each approach.   We can see her back in about 3-4 months.   Dr. Mellody Drown  CC:  Dr. Janese Banks

## 2019-01-12 ENCOUNTER — Inpatient Hospital Stay: Payer: Commercial Managed Care - PPO

## 2019-01-12 ENCOUNTER — Encounter: Payer: Self-pay | Admitting: Oncology

## 2019-01-12 ENCOUNTER — Other Ambulatory Visit: Payer: Self-pay

## 2019-01-12 ENCOUNTER — Inpatient Hospital Stay (HOSPITAL_BASED_OUTPATIENT_CLINIC_OR_DEPARTMENT_OTHER): Payer: Commercial Managed Care - PPO | Admitting: Oncology

## 2019-01-12 VITALS — BP 130/86 | HR 91 | Temp 98.1°F | Wt 106.0 lb

## 2019-01-12 DIAGNOSIS — D6959 Other secondary thrombocytopenia: Secondary | ICD-10-CM | POA: Diagnosis not present

## 2019-01-12 DIAGNOSIS — C801 Malignant (primary) neoplasm, unspecified: Secondary | ICD-10-CM

## 2019-01-12 DIAGNOSIS — Z5111 Encounter for antineoplastic chemotherapy: Secondary | ICD-10-CM | POA: Diagnosis not present

## 2019-01-12 DIAGNOSIS — T451X5A Adverse effect of antineoplastic and immunosuppressive drugs, initial encounter: Secondary | ICD-10-CM

## 2019-01-12 DIAGNOSIS — G620 Drug-induced polyneuropathy: Secondary | ICD-10-CM

## 2019-01-12 DIAGNOSIS — Z5112 Encounter for antineoplastic immunotherapy: Secondary | ICD-10-CM

## 2019-01-12 LAB — CBC WITH DIFFERENTIAL/PLATELET
Abs Immature Granulocytes: 0.06 10*3/uL (ref 0.00–0.07)
Basophils Absolute: 0 10*3/uL (ref 0.0–0.1)
Basophils Relative: 0 %
Eosinophils Absolute: 0 10*3/uL (ref 0.0–0.5)
Eosinophils Relative: 0 %
HCT: 34.6 % — ABNORMAL LOW (ref 36.0–46.0)
Hemoglobin: 11.1 g/dL — ABNORMAL LOW (ref 12.0–15.0)
Immature Granulocytes: 1 %
Lymphocytes Relative: 11 %
Lymphs Abs: 1 10*3/uL (ref 0.7–4.0)
MCH: 36.6 pg — ABNORMAL HIGH (ref 26.0–34.0)
MCHC: 32.1 g/dL (ref 30.0–36.0)
MCV: 114.2 fL — ABNORMAL HIGH (ref 80.0–100.0)
Monocytes Absolute: 0.7 10*3/uL (ref 0.1–1.0)
Monocytes Relative: 7 %
Neutro Abs: 7.2 10*3/uL (ref 1.7–7.7)
Neutrophils Relative %: 81 %
Platelets: 119 10*3/uL — ABNORMAL LOW (ref 150–400)
RBC: 3.03 MIL/uL — ABNORMAL LOW (ref 3.87–5.11)
RDW: 14.6 % (ref 11.5–15.5)
WBC: 8.9 10*3/uL (ref 4.0–10.5)
nRBC: 0 % (ref 0.0–0.2)

## 2019-01-12 LAB — COMPREHENSIVE METABOLIC PANEL
ALT: 52 U/L — ABNORMAL HIGH (ref 0–44)
AST: 43 U/L — ABNORMAL HIGH (ref 15–41)
Albumin: 3.5 g/dL (ref 3.5–5.0)
Alkaline Phosphatase: 83 U/L (ref 38–126)
Anion gap: 9 (ref 5–15)
BUN: 19 mg/dL (ref 8–23)
CO2: 30 mmol/L (ref 22–32)
Calcium: 9.8 mg/dL (ref 8.9–10.3)
Chloride: 101 mmol/L (ref 98–111)
Creatinine, Ser: 0.59 mg/dL (ref 0.44–1.00)
GFR calc Af Amer: >60 mL/min (ref >60)
GFR calc non Af Amer: >60 mL/min (ref >60)
Glucose, Bld: 251 mg/dL — ABNORMAL HIGH (ref 70–99)
Potassium: 4.5 mmol/L (ref 3.5–5.1)
Sodium: 140 mmol/L (ref 135–145)
Total Bilirubin: 0.4 mg/dL (ref 0.3–1.2)
Total Protein: 7.1 g/dL (ref 6.5–8.1)

## 2019-01-12 LAB — PROTEIN, URINE, RANDOM: Total Protein, Urine: 41 mg/dL

## 2019-01-12 MED ORDER — SODIUM CHLORIDE 0.9 % IV SOLN
Freq: Once | INTRAVENOUS | Status: AC
  Administered 2019-01-12: 11:00:00 via INTRAVENOUS
  Filled 2019-01-12: qty 5

## 2019-01-12 MED ORDER — SODIUM CHLORIDE 0.9 % IV SOLN
160.0000 mg/m2 | Freq: Once | INTRAVENOUS | Status: AC
  Administered 2019-01-12: 246 mg via INTRAVENOUS
  Filled 2019-01-12: qty 41

## 2019-01-12 MED ORDER — FAMOTIDINE IN NACL 20-0.9 MG/50ML-% IV SOLN
20.0000 mg | Freq: Once | INTRAVENOUS | Status: AC
  Administered 2019-01-12: 20 mg via INTRAVENOUS
  Filled 2019-01-12: qty 50

## 2019-01-12 MED ORDER — DIPHENHYDRAMINE HCL 50 MG/ML IJ SOLN
50.0000 mg | Freq: Once | INTRAMUSCULAR | Status: AC
  Administered 2019-01-12: 50 mg via INTRAVENOUS
  Filled 2019-01-12: qty 1

## 2019-01-12 MED ORDER — PALONOSETRON HCL INJECTION 0.25 MG/5ML
0.2500 mg | Freq: Once | INTRAVENOUS | Status: AC
  Administered 2019-01-12: 0.25 mg via INTRAVENOUS
  Filled 2019-01-12: qty 5

## 2019-01-12 MED ORDER — HEPARIN SOD (PORK) LOCK FLUSH 100 UNIT/ML IV SOLN
500.0000 [IU] | Freq: Once | INTRAVENOUS | Status: AC | PRN
  Administered 2019-01-12: 500 [IU]

## 2019-01-12 MED ORDER — PEGFILGRASTIM 6 MG/0.6ML ~~LOC~~ PSKT
6.0000 mg | PREFILLED_SYRINGE | Freq: Once | SUBCUTANEOUS | Status: AC
  Administered 2019-01-12: 6 mg via SUBCUTANEOUS
  Filled 2019-01-12: qty 0.6

## 2019-01-12 MED ORDER — SODIUM CHLORIDE 0.9 % IV SOLN
Freq: Once | INTRAVENOUS | Status: AC
  Administered 2019-01-12: 10:00:00 via INTRAVENOUS
  Filled 2019-01-12: qty 250

## 2019-01-12 MED ORDER — SODIUM CHLORIDE 0.9 % IV SOLN
15.0000 mg/kg | Freq: Once | INTRAVENOUS | Status: AC
  Administered 2019-01-12: 800 mg via INTRAVENOUS
  Filled 2019-01-12: qty 32

## 2019-01-12 MED ORDER — SODIUM CHLORIDE 0.9 % IV SOLN
390.0000 mg | Freq: Once | INTRAVENOUS | Status: AC
  Administered 2019-01-12: 390 mg via INTRAVENOUS
  Filled 2019-01-12: qty 39

## 2019-01-12 NOTE — Progress Notes (Signed)
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 She presented with symptoms of bilateral lower extremity swelling as well as abdominal distention.GI. This was followed by a CT abdomen and chest imaging with contrast. CT showed cystic lesions of the enlarged bilateral ovaries the largest lesion on the left measuring 6.2 cm. Patient also noted to have numerous bulky retroperitoneal lymph nodes, largest left periaortic node or conglomerates near the superior mesenteric artery measuring at least 4.4 x 2.9 cm. Significant ascites was present. Patient underwent paracentesis and cytology from the fluid showed high-grade serous carcinoma. Tumor cells were positive for CK7, PAX 8, p53 and WT 1.  Patient started neoadjuvant carbotaxol and Avastin chemotherapy on 08/21/2018 and completed 4 cycles on 11/02/2018.  Patient did not receive Avastin for cycle 4.  Pathology: DIAGNOSIS:  A. UTERUS AND CERVIX WITH BILATERAL FALLOPIAN TUBES AND OVARIES;  HYSTERECTOMY WITH BILATERAL SALPINGO-OOPHORECTOMY:  - HIGH-GRADE SEROUS ADENOCARCINOMA.  - REFER TO CANCER SUMMARY BELOW.   CANCER CASE SUMMARY: OVARY or FALLOPIAN TUBE or PRIMARY PERITONEUM  Procedure: Total hysterectomy with bilateral salpingo-oophorectomy  Specimen Integrity: Intact  Tumor Site: Bilateral ovaries and  fallopian tubes  Ovarian Surface Involvement: Present, bilateral  Fallopian Tube Surface Involvement: Present, left  Tumor Size: Greatest dimension 1.2 cm  Histologic Type: Serous carcinoma  Histologic Grade: High grade  Implants: N/A  Other Tissue/ Organ Involvement: Appendix, parametrium  Largest Extrapelvic Peritoneal Focus: Microscopic  Peritoneal/Ascitic Fluid: Positive for malignancy  Treatment Effect: Moderate response; see comment.  Regional Lymph Nodes: No nodes submitted or found  Pathologic Stage Classification (pTNM, AJCC 8th Edition): pT3a pNX pM1b  FIGO: IVB  TNM Descriptors: N/A   Comment:  Chemotherapy Response Score (CRS) is assigned based on evaluation of tumor response in the omentum, which is not submitted in this specimen. In the submitted tissue, there is a partial/moderate tumor response.   B. APPENDIX, APPENDECTOMY:  - TRANSMURAL INVOLVEMENT BY HIGH-GRADE SEROUS ADENOCARCINOMA.  Interval history-reports mild numbness in her right foot and pain in her right leg.  She has not started taking gabapentin yet.  ECOG PS- 1 Pain scale- 3 Opioid associated constipation- no  Review of systems- Review of Systems  Constitutional: Negative for chills, fever, malaise/fatigue and weight loss.  HENT: Negative for congestion, ear discharge and nosebleeds.   Eyes: Negative for blurred vision.  Respiratory: Negative for cough, hemoptysis, sputum production, shortness of breath and wheezing.   Cardiovascular: Negative for chest pain, palpitations, orthopnea and claudication.  Gastrointestinal: Negative for abdominal pain, blood in stool, constipation, diarrhea, heartburn, melena, nausea and vomiting.  Genitourinary: Negative for dysuria, flank pain, frequency, hematuria and urgency.  Musculoskeletal: Negative for back pain, joint pain and myalgias.       Right leg pain  Skin: Negative for rash.  Neurological: Negative for dizziness, tingling, focal weakness, seizures,  weakness and headaches.  Endo/Heme/Allergies: Does not bruise/bleed easily.  Psychiatric/Behavioral: Negative for depression and suicidal ideas. The patient does not have  insomnia.        Allergies  Allergen Reactions  . Clarithromycin Nausea Only  . Doxycycline Nausea Only    dizziness  . Penicillins Hives    Did it involve swelling of the face/tongue/throat, SOB, or low BP? No Did it involve sudden or severe rash/hives, skin peeling, or any reaction on the inside of your mouth or nose? No Did you need to seek medical attention at a hospital or doctor's office? No When did it last happen?50+ years ago If all above answers are "NO", may proceed with cephalosporin use.      Past Medical History:  Diagnosis Date  . Allergy   . Anemia   . Complication of anesthesia   . Diabetes mellitus without complication ([place])   . Diverticulosis   . Family history of adverse reaction to anesthesia    mom-n/v  . Family history of lung cancer   . GERD (gastroesophageal reflux disease)    h/o  . Hyperlipidemia   . Hypothyroidism   . Ovarian cancer ([place]) 08/04/2018   Chemo tx's.  . Personal history of chemotherapy   . PONV (postoperative nausea and vomiting)    sick with tonsillectomy  . Thyroid disease      Past Surgical History:  Procedure Laterality Date  . ABDOMINAL HYSTERECTOMY    . BACK SURGERY  2007   Lumbar Surgery  . BREAST CYST ASPIRATION Right    Around 10 years ago. Pt thinks it was the right breast but not sure.  Marland Kitchen CATARACT EXTRACTION Left 2010   Right Eye 2011  . COLONOSCOPY WITH PROPOFOL N/A 09/02/2015   Procedure: COLONOSCOPY WITH PROPOFOL;  Surgeon: [name], MD;  Location: [place];  Service: Endoscopy;  Laterality: N/A;  Diabetic - oral meds  . ESOPHAGOGASTRODUODENOSCOPY (EGD) WITH PROPOFOL N/A 11/03/2015   Procedure: ESOPHAGOGASTRODUODENOSCOPY (EGD) WITH PROPOFOL;  Surgeon: [name], MD;  Location: [place];  Service: Endoscopy;   Laterality: N/A;  . EYE SURGERY Bilateral 06/2014    tear duck catherization ( Dr. Vallarie Mare)  . LAPAROSCOPIC APPENDECTOMY  11/26/2018   Procedure: APPENDECTOMY LAPAROSCOPIC;  Surgeon: Ward, Honor Loh, MD;  Location: [facility];  Service: Gynecology;;  . POLYPECTOMY  09/02/2015   Procedure: POLYPECTOMY;  Surgeon: [name], MD;  Location: [place];  Service: Endoscopy;;  . TONSILLECTOMY  1963    Social History   Socioeconomic History  . Marital status: Single    Spouse name: Not on file  . Number of children: Not on file  . Years of education: Not on file  . Highest education level: Not on file  Occupational History  . Not on file  Social Needs  . Financial resource strain: Not on file  . Food insecurity    Worry: Not on file    Inability: Not on file  . Transportation needs    Medical: Not on file    Non-medical: Not on file  Tobacco Use  . Smoking status: Never Smoker  . Smokeless tobacco: Never Used  Substance and Sexual Activity  . Alcohol use: No  . Drug use: No  . Sexual activity: Not on file  Lifestyle  . Physical activity    Days per week: Not on file    Minutes per session: Not on file  . Stress: Not on file  Relationships  . Social Herbalist on phone: Not on file    Gets together: Not on file    Attends religious service: Not  on file    Active member of club or organization: Not on file    Attends meetings of clubs or organizations: Not on file    Relationship status: Not on file  . Intimate partner violence    Fear of current or ex partner: Not on file    Emotionally abused: Not on file    Physically abused: Not on file    Forced sexual activity: Not on file  Other Topics Concern  . Not on file  Social History Narrative  . Not on file    Family History  Problem Relation Age of Onset  . Congestive Heart Failure Mother   . Diabetes Mother   . Stroke Mother   . Hypothyroidism Father   . Cancer Father        lung cancer  .  Cancer Paternal Grandmother        stomach  . Breast cancer Neg Hx      Current Outpatient Medications:  .  acetaminophen (TYLENOL) 500 MG tablet, Take 2 tablets (1,000 mg total) by mouth every 6 (six) hours., Disp: , Rfl:  .  aspirin 81 MG tablet, Take 81 mg by mouth daily. , Disp: , Rfl:  .  calcipotriene (DOVONOX) 0.005 % ointment, Apply 1 application topically as needed. CALCIPOTRIENE, 0.005% (External Ointment) - Historical Medication  apply to skin daily (0.005 %) Active, Disp: , Rfl:  .  dexamethasone (DECADRON) 4 MG tablet, Take 2 tablets (8 mg total) by mouth daily. Start the day after carboplatin chemotherapy for 3 days., Disp: 30 tablet, Rfl: 1 .  fexofenadine (ALLEGRA) 60 MG tablet, Take 60 mg by mouth daily as needed for allergies. , Disp: , Rfl:  .  furosemide (LASIX) 20 MG tablet, Take 1 tablet (20 mg total) by mouth daily., Disp: 20 tablet, Rfl: 0 .  gabapentin (NEURONTIN) 300 MG capsule, Take 1 capsule (300 mg total) by mouth at bedtime. Take 2 tablets at bedtime once and then 3 tablets for three nights at bedtime., Disp: 11 capsule, Rfl: 0 .  glipiZIDE (GLUCOTROL) 5 MG tablet, TAKE 1 TABLET BY MOUTH DAILY BEFORE BREAKFAST (Patient taking differently: Take 5 mg by mouth daily before breakfast. ), Disp: 90 tablet, Rfl: 1 .  glucose blood (ONE TOUCH ULTRA TEST) test strip, USE TO TEST BLOOD SUGAR TWICE A DAY AS INSTRUCTED, Disp: 100 each, Rfl: 12 .  ibuprofen (ADVIL) 600 MG tablet, Take 1 tablet (600 mg total) by mouth every 6 (six) hours., Disp: 45 tablet, Rfl: 1 .  levothyroxine (SYNTHROID) 100 MCG tablet, Take 1 tablet (100 mcg total) by mouth daily. (Patient taking differently: Take 100 mcg by mouth at bedtime. ), Disp: 90 tablet, Rfl: 1 .  LORazepam (ATIVAN) 0.5 MG tablet, Take 1 tablet (0.5 mg total) by mouth every 6 (six) hours as needed (Nausea or vomiting)., Disp: 30 tablet, Rfl: 0 .  metFORMIN (GLUCOPHAGE) 500 MG tablet, Take 2 tablets (1,000 mg total) by mouth 2 (two)  times daily., Disp: 360 tablet, Rfl: 0 .  MULTIPLE VITAMIN PO, Take 1 tablet by mouth daily. , Disp: , Rfl:  .  ondansetron (ZOFRAN) 8 MG tablet, Take 1 tablet (8 mg total) by mouth 2 (two) times daily as needed for refractory nausea / vomiting. Start on day 3 after carboplatin chemo., Disp: 30 tablet, Rfl: 1 .  ONETOUCH DELICA LANCETS 32K MISC, USE TO TEST BLOOD SUGAR ONCE A DAY, Disp: 100 each, Rfl: 12 .  oxyCODONE (ROXICODONE) 5 MG immediate release  tablet, Take 1 tablet (5 mg total) by mouth every 8 (eight) hours as needed., Disp: 16 tablet, Rfl: 0 .  prochlorperazine (COMPAZINE) 10 MG tablet, Take 1 tablet (10 mg total) by mouth every 6 (six) hours as needed (Nausea or vomiting)., Disp: 30 tablet, Rfl: 1 .  simvastatin (ZOCOR) 20 MG tablet, Take 1 tablet (20 mg total) by mouth at bedtime., Disp: 90 tablet, Rfl: 1 .  spironolactone (ALDACTONE) 50 MG tablet, Take 1 tablet (50 mg total) by mouth daily., Disp: 20 tablet, Rfl: 0 No current facility-administered medications for this visit.   Facility-Administered Medications Ordered in Other Visits:  .  CARBOplatin (PARAPLATIN) 390 mg in sodium chloride 0.9 % 250 mL chemo infusion, 390 mg, Intravenous, Once, Sindy Guadeloupe, MD .  PACLitaxel (TAXOL) 246 mg in sodium chloride 0.9 % 250 mL chemo infusion (> '80mg'$ /m2), 160 mg/m2 (Treatment Plan Recorded), Intravenous, Once, Sindy Guadeloupe, MD, Last Rate: 97 mL/hr at 01/12/19 1140, 246 mg at 01/12/19 1140 .  pegfilgrastim (NEULASTA ONPRO KIT) injection 6 mg, 6 mg, Subcutaneous, Once, Randa Evens C, MD .  sodium chloride flush (NS) 0.9 % injection 10 mL, 10 mL, Intravenous, Once, Sindy Guadeloupe, MD  Physical exam:  Vitals:   01/12/19 0858  BP: 130/86  Pulse: 91  Temp: 98.1 F (36.7 C)  TempSrc: Tympanic  Weight: 106 lb (48.1 kg)   Physical Exam HENT:     Head: Normocephalic and atraumatic.  Eyes:     Pupils: Pupils are equal, round, and reactive to light.  Neck:     Musculoskeletal: Normal  range of motion.  Cardiovascular:     Rate and Rhythm: Normal rate and regular rhythm.     Heart sounds: Normal heart sounds.  Pulmonary:     Effort: Pulmonary effort is normal.     Breath sounds: Normal breath sounds.  Abdominal:     General: Bowel sounds are normal.     Palpations: Abdomen is soft.  Musculoskeletal:     Comments: Bilaterally trace edema  Skin:    General: Skin is warm and dry.  Neurological:     Mental Status: She is alert and oriented to person, place, and time.      CMP Latest Ref Rng & Units 01/12/2019  Glucose 70 - 99 mg/dL 251(H)  BUN 8 - 23 mg/dL 19  Creatinine 0.44 - 1.00 mg/dL 0.59  Sodium 135 - 145 mmol/L 140  Potassium 3.5 - 5.1 mmol/L 4.5  Chloride 98 - 111 mmol/L 101  CO2 22 - 32 mmol/L 30  Calcium 8.9 - 10.3 mg/dL 9.8  Total Protein 6.5 - 8.1 g/dL 7.1  Total Bilirubin 0.3 - 1.2 mg/dL 0.4  Alkaline Phos 38 - 126 U/L 83  AST 15 - 41 U/L 43(H)  ALT 0 - 44 U/L 52(H)   CBC Latest Ref Rng & Units 01/12/2019  WBC 4.0 - 10.5 K/uL 8.9  Hemoglobin 12.0 - 15.0 g/dL 11.1(L)  Hematocrit 36.0 - 46.0 % 34.6(L)  Platelets 150 - 400 K/uL 119(L)    No images are attached to the encounter.  Mm 3d Screen Breast Bilateral  Result Date: 12/31/2018 CLINICAL DATA:  Screening. EXAM: DIGITAL SCREENING BILATERAL MAMMOGRAM WITH TOMO AND CAD COMPARISON:  Previous exam(s). ACR Breast Density Category c: The breast tissue is heterogeneously dense, which may obscure small masses. FINDINGS: There are no findings suspicious for malignancy. Images were processed with CAD. IMPRESSION: No mammographic evidence of malignancy. A result letter of this screening mammogram  will be mailed directly to the patient. RECOMMENDATION: Screening mammogram in one year. (Code:[id number]-B-01Y) BI-RADS CATEGORY  1: Negative. Electronically Signed   By: Lovey Newcomer M.D.   On: 12/31/2018 08:50     Assessment and plan- Patient is a 63 y.o. 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  Avastin was held for cycle 4.  She is s/p robotic assisted total laparoscopic hysterectomy and bilateral salpingo-oophorectomy and appendectomy with extensive lysis of adhesions and abdomen debulking.  She is here for on treatment assessment prior to cycle 6 of adjuvant carbotaxol Mvasi chemotherapy  Patient is roughly 6 weeks out from her surgery.  This is cycle 6 of carbotaxol chemotherapy and her second cycle of adjuvant.  I will be adding Mvasi to her regimen today.  Mild chemo induced thrombocytopenia which we will continue to monitor.  Urine protein is trace and her blood pressure is stable to proceed with Mvasi  She will return to clinic in 3 weeks with CBC with differential, CMP and CA-125 and see covering provider for cycle 7 of carbotaxol Mvasi chemotherapy (third cycle adjuvant.  4 cycles received neoadjuvantly)  I did discuss maintenance Mvasi for her given every 3 weeks until month 15.  GOG 218 was randomized control trial looking at stage III or stage IV advanced epithelial ovarian/fallopian tube or peritoneal cancer.  Addition of  Avastin did not significantly improve progression free survival in the overall cohort.  However subgroup analysis showed that concurrent maintenance treatment with bevacizumab improved PFS and OFS in women with stage IV disease and especially those with ascites such as this patient.  Patient would like to think about it and get back to [place] next time   I therefore do feel that she would benefit from maintenance Avastin if she is able to tolerate it well.  She is BRCA negative and does not have HRD.  I therefore do not think that she would benefit significantly with maintenance PARP inhibitor  I will tentatively see her back in 6 weeks time with labs for cycle 1 of maintenance mvasi  Chemo-induced peripheral neuropathy: Taxol has been dose reduced to 160 mg per  metered square.  Patient is willing to try gabapentin which she will start at 300 mg at night and slowly increase it to 3 times daily.  Anemia: Combination of chemotherapy along with iron and B12 deficiency.  Improved after iron and B12 supplements.     Visit Diagnosis 1. Encounter for antineoplastic chemotherapy   2. Encounter for monoclonal antibody treatment for malignancy   3. Chemotherapy-induced thrombocytopenia   4. Serous adenocarcinoma ([place])   5. Chemotherapy-induced peripheral neuropathy ([facility])      Dr. Randa Evens, MD, MPH [facility] [phone] 01/12/2019 1:16 PM

## 2019-01-12 NOTE — Progress Notes (Signed)
Patient stated that she had been doing well except that her right foot continues to limp. Patient stated that she walks slow so she doesn't fall.

## 2019-01-13 LAB — CA 125: Cancer Antigen (CA) 125: 12.2 U/mL (ref 0.0–38.1)

## 2019-01-15 ENCOUNTER — Ambulatory Visit: Payer: Commercial Managed Care - PPO | Admitting: Oncology

## 2019-01-15 ENCOUNTER — Other Ambulatory Visit: Payer: Commercial Managed Care - PPO

## 2019-01-15 ENCOUNTER — Ambulatory Visit: Payer: Commercial Managed Care - PPO

## 2019-01-19 ENCOUNTER — Other Ambulatory Visit: Payer: Self-pay | Admitting: Physician Assistant

## 2019-01-19 ENCOUNTER — Inpatient Hospital Stay
Payer: Commercial Managed Care - PPO | Attending: Hospice and Palliative Medicine | Admitting: Hospice and Palliative Medicine

## 2019-01-19 DIAGNOSIS — D6481 Anemia due to antineoplastic chemotherapy: Secondary | ICD-10-CM | POA: Insufficient documentation

## 2019-01-19 DIAGNOSIS — E039 Hypothyroidism, unspecified: Secondary | ICD-10-CM

## 2019-01-19 DIAGNOSIS — C562 Malignant neoplasm of left ovary: Secondary | ICD-10-CM | POA: Insufficient documentation

## 2019-01-19 DIAGNOSIS — Z5112 Encounter for antineoplastic immunotherapy: Secondary | ICD-10-CM | POA: Insufficient documentation

## 2019-01-19 DIAGNOSIS — Z515 Encounter for palliative care: Secondary | ICD-10-CM

## 2019-01-19 DIAGNOSIS — C801 Malignant (primary) neoplasm, unspecified: Secondary | ICD-10-CM

## 2019-01-19 DIAGNOSIS — D6959 Other secondary thrombocytopenia: Secondary | ICD-10-CM | POA: Insufficient documentation

## 2019-01-19 DIAGNOSIS — C561 Malignant neoplasm of right ovary: Secondary | ICD-10-CM | POA: Insufficient documentation

## 2019-01-19 DIAGNOSIS — E119 Type 2 diabetes mellitus without complications: Secondary | ICD-10-CM

## 2019-01-19 MED ORDER — LEVOTHYROXINE SODIUM 100 MCG PO TABS: 100.0000 ug | ORAL_TABLET | Freq: Every day | ORAL | 1 refills | Status: DC

## 2019-01-19 MED ORDER — METFORMIN HCL 500 MG PO TABS: 1000.0000 mg | ORAL_TABLET | Freq: Two times a day (BID) | ORAL | 1 refills | Status: DC

## 2019-01-19 NOTE — Progress Notes (Signed)
Refilled levothyroxine and metformin for patient

## 2019-01-19 NOTE — Progress Notes (Signed)
Virtual Visit via Telephone Note  I connected with Meredith Jones on 01/19/19 at  1:00 PM EST by telephone and verified that I am speaking with the correct person using two identifiers.   I discussed the limitations, risks, security and privacy concerns of performing an evaluation and management service by telephone and the availability of in person appointments. I also discussed with the patient that there may be a patient responsible charge related to this service. The patient expressed understanding and agreed to proceed.   History of Present Illness: Palliative Care consult requested for this 63 y.o. 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  Abdominal CT revealed cystic lesions in the bilateral ovaries with numerous bulky retroperitoneal lymph nodes and a large amount of ascites.  She has required therapeutic paracentesis.  Patient is s/p recent TAH and BSO on 11/26/2018. She is now receiving adjuvant systemic chemotherapy.  Patient was referred to palliative care to help address goals and manage ongoing symptoms.   Observations/Objective: I called and spoke with patient by phone.  She reports that she is doing reasonably well.  She denies any acute changes or concerns.  She denies any distressing symptoms at present.  Patient reports that her appetite is "too good."  She has occasional weakness but is still managing her own self-care.  Patient requests refills on Metformin and Synthroid.  Message sent to PCP, who will refill.  Assessment and Plan: Stage III serous adenocarcinoma the ovary -plan is for continued systemic chemotherapy.  Patient is being followed by Dr. Janese Banks.  Goals of care -are aligned with continued treatment.  Patient has opted to remain a full code  Follow Up Instructions: Follow-up telephone visit in 4 weeks   I discussed the assessment and treatment plan with the patient. The patient was provided an  opportunity to ask questions and all were answered. The patient agreed with the plan and demonstrated an understanding of the instructions.   The patient was advised to call back or seek an in-person evaluation if the symptoms worsen or if the condition fails to improve as anticipated.  I provided 5 minutes of non-face-to-face time during this encounter.   Meredith Hong, NP

## 2019-01-30 ENCOUNTER — Other Ambulatory Visit: Payer: Self-pay

## 2019-01-30 ENCOUNTER — Encounter: Payer: Self-pay | Admitting: Internal Medicine

## 2019-02-02 ENCOUNTER — Inpatient Hospital Stay: Payer: Commercial Managed Care - PPO

## 2019-02-02 ENCOUNTER — Other Ambulatory Visit: Payer: Self-pay

## 2019-02-02 ENCOUNTER — Inpatient Hospital Stay (HOSPITAL_BASED_OUTPATIENT_CLINIC_OR_DEPARTMENT_OTHER): Payer: Commercial Managed Care - PPO | Admitting: Internal Medicine

## 2019-02-02 VITALS — BP 118/76 | HR 75

## 2019-02-02 DIAGNOSIS — C801 Malignant (primary) neoplasm, unspecified: Secondary | ICD-10-CM

## 2019-02-02 DIAGNOSIS — Z95828 Presence of other vascular implants and grafts: Secondary | ICD-10-CM

## 2019-02-02 DIAGNOSIS — E039 Hypothyroidism, unspecified: Secondary | ICD-10-CM | POA: Diagnosis not present

## 2019-02-02 DIAGNOSIS — C562 Malignant neoplasm of left ovary: Secondary | ICD-10-CM

## 2019-02-02 DIAGNOSIS — D6959 Other secondary thrombocytopenia: Secondary | ICD-10-CM | POA: Diagnosis not present

## 2019-02-02 DIAGNOSIS — D6481 Anemia due to antineoplastic chemotherapy: Secondary | ICD-10-CM | POA: Diagnosis not present

## 2019-02-02 DIAGNOSIS — C561 Malignant neoplasm of right ovary: Secondary | ICD-10-CM | POA: Diagnosis present

## 2019-02-02 DIAGNOSIS — C563 Malignant neoplasm of bilateral ovaries: Secondary | ICD-10-CM

## 2019-02-02 DIAGNOSIS — Z5111 Encounter for antineoplastic chemotherapy: Secondary | ICD-10-CM

## 2019-02-02 DIAGNOSIS — Z5112 Encounter for antineoplastic immunotherapy: Secondary | ICD-10-CM | POA: Diagnosis present

## 2019-02-02 LAB — COMPREHENSIVE METABOLIC PANEL
ALT: 25 U/L (ref 0–44)
AST: 24 U/L (ref 15–41)
Albumin: 3.5 g/dL (ref 3.5–5.0)
Alkaline Phosphatase: 87 U/L (ref 38–126)
Anion gap: 10 (ref 5–15)
BUN: 19 mg/dL (ref 8–23)
CO2: 25 mmol/L (ref 22–32)
Calcium: 9.8 mg/dL (ref 8.9–10.3)
Chloride: 102 mmol/L (ref 98–111)
Creatinine, Ser: 0.6 mg/dL (ref 0.44–1.00)
GFR calc Af Amer: >60 mL/min (ref >60)
GFR calc non Af Amer: >60 mL/min (ref >60)
Glucose, Bld: 184 mg/dL — ABNORMAL HIGH (ref 70–99)
Potassium: 4.6 mmol/L (ref 3.5–5.1)
Sodium: 137 mmol/L (ref 135–145)
Total Bilirubin: 0.5 mg/dL (ref 0.3–1.2)
Total Protein: 7 g/dL (ref 6.5–8.1)

## 2019-02-02 LAB — CBC WITH DIFFERENTIAL/PLATELET
Abs Immature Granulocytes: 0.09 10*3/uL — ABNORMAL HIGH (ref 0.00–0.07)
Basophils Absolute: 0 10*3/uL (ref 0.0–0.1)
Basophils Relative: 0 %
Eosinophils Absolute: 0.1 10*3/uL (ref 0.0–0.5)
Eosinophils Relative: 1 %
HCT: 34.5 % — ABNORMAL LOW (ref 36.0–46.0)
Hemoglobin: 10.7 g/dL — ABNORMAL LOW (ref 12.0–15.0)
Immature Granulocytes: 1 %
Lymphocytes Relative: 11 %
Lymphs Abs: 1.1 10*3/uL (ref 0.7–4.0)
MCH: 35.4 pg — ABNORMAL HIGH (ref 26.0–34.0)
MCHC: 31 g/dL (ref 30.0–36.0)
MCV: 114.2 fL — ABNORMAL HIGH (ref 80.0–100.0)
Monocytes Absolute: 0.9 10*3/uL (ref 0.1–1.0)
Monocytes Relative: 9 %
Neutro Abs: 8.4 10*3/uL — ABNORMAL HIGH (ref 1.7–7.7)
Neutrophils Relative %: 78 %
Platelets: 146 10*3/uL — ABNORMAL LOW (ref 150–400)
RBC: 3.02 MIL/uL — ABNORMAL LOW (ref 3.87–5.11)
RDW: 16 % — ABNORMAL HIGH (ref 11.5–15.5)
WBC: 10.6 10*3/uL — ABNORMAL HIGH (ref 4.0–10.5)
nRBC: 0 % (ref 0.0–0.2)

## 2019-02-02 MED ORDER — SODIUM CHLORIDE 0.9 % IV SOLN
15.0000 mg/kg | Freq: Once | INTRAVENOUS | Status: AC
  Administered 2019-02-02: 800 mg via INTRAVENOUS
  Filled 2019-02-02: qty 32

## 2019-02-02 MED ORDER — SODIUM CHLORIDE 0.9 % IV SOLN
Freq: Once | INTRAVENOUS | Status: AC
  Filled 2019-02-02: qty 250

## 2019-02-02 MED ORDER — SODIUM CHLORIDE 0.9% FLUSH
10.0000 mL | Freq: Once | INTRAVENOUS | Status: AC
  Filled 2019-02-02: qty 10

## 2019-02-02 NOTE — Patient Instructions (Addendum)
#   right foot brace- re: foot drop. Be careful while walking to avoid falls.

## 2019-02-02 NOTE — Progress Notes (Signed)
Patient receives MVASI 800mg , calculated dose is slightly outside of 10%. Per MD ok to keep dose at 800mg .

## 2019-02-02 NOTE — Assessment & Plan Note (Addendum)
#   Bilateral ovarian cancer-stage IV-s/p neoadjuvant therapy-followed by optimal debulking.  Currently on completion chemotherapy with-carbotaxol plus Avastin.  # HOLD- carbo-taxol #7 today sec to foot drop; Proceed with Avastin- Labs today reviewed;  acceptable for treatment today.  Discussed that patient will likely get Avastin maintenance down the line.  #Peripheral neuropathy/ Right foot drop- sec to taxol; chemo; HOLD Carbo-Taxol today; proceed with avastin/mvasi. Referral to [facility].  Also recommend a brace.  #Mild anemia/thrombocytopenia- from chemo-STABLE.   # DISPOSITION:  # HOLD carbo-Taxol; ok to proceed with M-vasi.  # maureen- gait training referral # follow up with Dr.Rao as planned-Dr.B  Cc; Dr.rao.

## 2019-02-02 NOTE — Progress Notes (Signed)
Per Nira Conn RN, per Dr. Rogue Bussing no Taxol or Carboplatin today. Pt to receive Mvasi only.

## 2019-02-02 NOTE — Progress Notes (Signed)
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[place])   08/14/2018 Cancer Staging   Staging form: Exocrine Pancreas, AJCC 8th Edition - Clinical stage from 08/14/2018: Stage III (cT3, cN2, cM0) - Signed by Sindy Guadeloupe, MD on 08/17/2018   08/21/2018 -  Chemotherapy   The patient had palonosetron (ALOXI) injection 0.25 mg, 0.25 mg, Intravenous,  Once, 6 of 6 cycles Administration: 0.25 mg (08/21/2018), 0.25 mg (09/11/2018), 0.25 mg (10/02/2018), 0.25 mg (10/23/2018), 0.25 mg (12/22/2018), 0.25 mg (01/12/2019) pegfilgrastim (NEULASTA ONPRO KIT) injection 6 mg, 6 mg, Subcutaneous, Once, 6 of 6 cycles Administration: 6 mg (08/21/2018), 6 mg (09/11/2018), 6 mg (10/02/2018), 6 mg (10/23/2018), 6 mg (12/22/2018), 6 mg (01/12/2019) bevacizumab (AVASTIN) 800 mg in sodium chloride 0.9 % 100 mL chemo infusion, 15 mg/kg = 800 mg (100 % of original dose 15 mg/kg), Intravenous,  Once, 1 of 1 cycle Dose modification: 15 mg/kg (original dose 15 mg/kg, Cycle 1) Administration: 800 mg (08/21/2018) CARBOplatin (PARAPLATIN) 430 mg in sodium chloride 0.9 % 250 mL chemo infusion, 430 mg (100 % of original dose 429.5 mg), Intravenous,  Once, 6 of 6 cycles Dose modification:   (original dose 429.5 mg, Cycle 1) Administration: 430 mg (08/21/2018), 430 mg (09/11/2018), 430 mg (10/02/2018), 430 mg (10/23/2018), 390 mg (12/22/2018), 390 mg (01/12/2019) PACLitaxel (TAXOL) 270 mg in sodium chloride 0.9 % 250 mL chemo infusion (> '80mg'$ /m2), 175 mg/m2 = 270 mg, Intravenous,  Once, 6 of 6 cycles Dose modification: 160 mg/m2 (original dose 175 mg/m2, Cycle 5, Reason: Other (see comments), Comment: neuropathy) Administration: 270 mg  (08/21/2018), 270 mg (09/11/2018), 270 mg (10/02/2018), 270 mg (10/23/2018), 246 mg (12/22/2018), 246 mg (01/12/2019) fosaprepitant (EMEND) 150 mg, dexamethasone (DECADRON) 12 mg in sodium chloride 0.9 % 145 mL IVPB, , Intravenous,  Once, 6 of 6 cycles Administration:  (08/21/2018),  (09/11/2018),  (10/02/2018),  (10/23/2018),  (12/22/2018),  (01/12/2019) bevacizumab-awwb (MVASI) 800 mg in sodium chloride 0.9 % 100 mL chemo infusion, 15 mg/kg = 800 mg (100 % of original dose 15 mg/kg), Intravenous,  Once, 4 of 5 cycles Dose modification: 15 mg/kg (original dose 15 mg/kg, Cycle 2) Administration: 800 mg (09/11/2018), 800 mg (10/02/2018), 800 mg (01/12/2019), 800 mg (02/02/2019)  for chemotherapy treatment.    11/07/2018 Genetic Testing   Negative germline + somatic genetic testing. No pathogenic variants identified on the [facility] panel and [facility] HRD. The [facility] gene panel offered by [employer] includes sequencing and deletion/duplication testing of the following 35 genes: APC, ATM, AXIN2, BARD1, BMPR1A, BRCA1, BRCA2, BRIP1, CHD1, CDK4, CDKN2A, CHEK2, EPCAM (large rearrangement only), HOXB13, GALNT12, MLH1, MSH2, MSH3, MSH6, MUTYH, NBN, NTHL1, PALB2, PMS2, PTEN, RAD51C, RAD51D, RNF43, RPS20, SMAD4, STK11, and TP53. Sequencing was performed for select regions of POLE and POLD1, and large rearrangement analysis was performed for select regions of GREM1. The report date is 11/07/2018.    Bilateral primary ovarian cancer ([place])  02/02/2019 Initial Diagnosis   Bilateral primary ovarian cancer ([place])      HISTORY OF PRESENTING ILLNESS:  Meredith Jones 63 y.o.  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  Patient complains of wobbly gait over  the last 1 month or so.  She had one recent fall-when she felt that she could not lift her leg well enough.  No other falls.  Denies  any tingling or numbness.  No nausea no vomiting.   Review of Systems  Constitutional: Positive for malaise/fatigue. Negative for chills, diaphoresis, fever and weight loss.  HENT: Negative for nosebleeds and sore throat.   Eyes: Negative for double vision.  Respiratory: Negative for cough, hemoptysis, sputum production, shortness of breath and wheezing.   Cardiovascular: Negative for chest pain, palpitations, orthopnea and leg swelling.  Gastrointestinal: Negative for abdominal pain, blood in stool, constipation, diarrhea, heartburn, melena, nausea and vomiting.  Genitourinary: Negative for dysuria, frequency and urgency.  Musculoskeletal: Negative for back pain and joint pain.  Skin: Negative.  Negative for itching and rash.  Neurological: Positive for focal weakness. Negative for dizziness, tingling, weakness and headaches.  Endo/Heme/Allergies: Does not bruise/bleed easily.  Psychiatric/Behavioral: Negative for depression. The patient is not nervous/anxious and does not have insomnia.      MEDICAL HISTORY:  Past Medical History:  Diagnosis Date  . Allergy   . Anemia   . Complication of anesthesia   . Diabetes mellitus without complication ([place])   . Diverticulosis   . Family history of adverse reaction to anesthesia    mom-n/v  . Family history of lung cancer   . GERD (gastroesophageal reflux disease)    h/o  . Hyperlipidemia   . Hypothyroidism   . Ovarian cancer ([place]) 08/04/2018   Chemo tx's.  . Personal history of chemotherapy   . PONV (postoperative nausea and vomiting)    sick with tonsillectomy  . Thyroid disease     SURGICAL HISTORY: Past Surgical History:  Procedure Laterality Date  . ABDOMINAL HYSTERECTOMY    . BACK SURGERY  2007   Lumbar Surgery  . BREAST CYST ASPIRATION Right    Around 10 years ago. Pt thinks it was the right breast but not sure.  Marland Kitchen CATARACT EXTRACTION Left 2010   Right Eye 2011  . COLONOSCOPY WITH PROPOFOL N/A 09/02/2015   Procedure:  COLONOSCOPY WITH PROPOFOL;  Surgeon: [name], MD;  Location: [place];  Service: Endoscopy;  Laterality: N/A;  Diabetic - oral meds  . ESOPHAGOGASTRODUODENOSCOPY (EGD) WITH PROPOFOL N/A 11/03/2015   Procedure: ESOPHAGOGASTRODUODENOSCOPY (EGD) WITH PROPOFOL;  Surgeon: [name], MD;  Location: [facility] [facility];  Service: Endoscopy;  Laterality: N/A;  . EYE SURGERY Bilateral 06/2014    tear duck catherization ( Dr. Vallarie Mare)  . LAPAROSCOPIC APPENDECTOMY  11/26/2018   Procedure: APPENDECTOMY LAPAROSCOPIC;  Surgeon: Ward, Honor Loh, MD;  Location: [facility];  Service: Gynecology;;  . POLYPECTOMY  09/02/2015   Procedure: POLYPECTOMY;  Surgeon: [name], MD;  Location: [place];  Service: Endoscopy;;  . TONSILLECTOMY  1963    SOCIAL HISTORY: Social History   Socioeconomic History  . Marital status: Single    Spouse name: Not on file  . Number of children: Not on file  . Years of education: Not on file  . Highest education level: Not on file  Occupational History  . Not on file  Tobacco Use  . Smoking status: Never Smoker  . Smokeless tobacco: Never Used  Substance and Sexual Activity  . Alcohol use: No  . Drug use: No  . Sexual activity: Not on file  Other Topics Concern  . Not on file  Social History Narrative  . Not on file   Social Determinants of Health  Financial Resource Strain:   . Difficulty of Paying Living Expenses: Not on file  Food Insecurity:   . Worried About Charity fundraiser in the Last Year: Not on file  . Ran Out of Food in the Last Year: Not on file  Transportation Needs:   . Lack of Transportation (Medical): Not on file  . Lack of Transportation (Non-Medical): Not on file  Physical Activity:   . Days of Exercise per Week: Not on file  . Minutes of Exercise per Session: Not on file  Stress:   . Feeling of Stress : Not on file  Social Connections:   . Frequency of Communication with Friends and Family: Not on file  .  Frequency of Social Gatherings with Friends and Family: Not on file  . Attends Religious Services: Not on file  . Active Member of Clubs or Organizations: Not on file  . Attends Archivist Meetings: Not on file  . Marital Status: Not on file  Intimate Partner Violence:   . Fear of Current or Ex-Partner: Not on file  . Emotionally Abused: Not on file  . Physically Abused: Not on file  . Sexually Abused: Not on file    FAMILY HISTORY: Family History  Problem Relation Age of Onset  . Congestive Heart Failure Mother   . Diabetes Mother   . Stroke Mother   . Hypothyroidism Father   . Cancer Father        lung cancer  . Cancer Paternal Grandmother        stomach  . Breast cancer Neg Hx     ALLERGIES:  is allergic to clarithromycin; doxycycline; and penicillins.  MEDICATIONS:  Current Outpatient Medications  Medication Sig Dispense Refill  . acetaminophen (TYLENOL) 500 MG tablet Take 2 tablets (1,000 mg total) by mouth every 6 (six) hours.    Marland Kitchen dexamethasone (DECADRON) 4 MG tablet Take 2 tablets (8 mg total) by mouth daily. Start the day after carboplatin chemotherapy for 3 days. 30 tablet 1  . furosemide (LASIX) 20 MG tablet Take 1 tablet (20 mg total) by mouth daily. 20 tablet 0  . gabapentin (NEURONTIN) 300 MG capsule Take 1 capsule (300 mg total) by mouth at bedtime. Take 2 tablets at bedtime once and then 3 tablets for three nights at bedtime. 11 capsule 0  . glipiZIDE (GLUCOTROL) 5 MG tablet TAKE 1 TABLET BY MOUTH DAILY BEFORE BREAKFAST (Patient taking differently: Take 5 mg by mouth daily before breakfast. ) 90 tablet 1  . glucose blood (ONE TOUCH ULTRA TEST) test strip USE TO TEST BLOOD SUGAR TWICE A DAY AS INSTRUCTED 100 each 12  . ibuprofen (ADVIL) 600 MG tablet Take 1 tablet (600 mg total) by mouth every 6 (six) hours. 45 tablet 1  . levothyroxine (SYNTHROID) 100 MCG tablet Take 1 tablet (100 mcg total) by mouth daily. 90 tablet 1  . metFORMIN (GLUCOPHAGE) 500 MG  tablet Take 2 tablets (1,000 mg total) by mouth 2 (two) times daily. 360 tablet 1  . MULTIPLE VITAMIN PO Take 1 tablet by mouth daily.     Glory Rosebush DELICA LANCETS 55D MISC USE TO TEST BLOOD SUGAR ONCE A DAY 100 each 12  . simvastatin (ZOCOR) 20 MG tablet Take 1 tablet (20 mg total) by mouth at bedtime. 90 tablet 1  . spironolactone (ALDACTONE) 50 MG tablet Take 1 tablet (50 mg total) by mouth daily. 20 tablet 0  . aspirin 81 MG tablet Take 81 mg by mouth daily.     Marland Kitchen  calcipotriene (DOVONOX) 0.005 % ointment Apply 1 application topically as needed. CALCIPOTRIENE, 0.005% (External Ointment) - Historical Medication  apply to skin daily (0.005 %) Active    . fexofenadine (ALLEGRA) 60 MG tablet Take 60 mg by mouth daily as needed for allergies.     Marland Kitchen LORazepam (ATIVAN) 0.5 MG tablet Take 1 tablet (0.5 mg total) by mouth every 6 (six) hours as needed (Nausea or vomiting). (Patient not taking: Reported on 01/30/2019) 30 tablet 0  . ondansetron (ZOFRAN) 8 MG tablet Take 1 tablet (8 mg total) by mouth 2 (two) times daily as needed for refractory nausea / vomiting. Start on day 3 after carboplatin chemo. (Patient not taking: Reported on 01/30/2019) 30 tablet 1  . oxyCODONE (ROXICODONE) 5 MG immediate release tablet Take 1 tablet (5 mg total) by mouth every 8 (eight) hours as needed. (Patient not taking: Reported on 01/30/2019) 16 tablet 0  . prochlorperazine (COMPAZINE) 10 MG tablet Take 1 tablet (10 mg total) by mouth every 6 (six) hours as needed (Nausea or vomiting). (Patient not taking: Reported on 01/30/2019) 30 tablet 1   No current facility-administered medications for this visit.   Facility-Administered Medications Ordered in Other Visits  Medication Dose Route Frequency Provider Last Rate Last Admin  . sodium chloride flush (NS) 0.9 % injection 10 mL  10 mL Intravenous Once Sindy Guadeloupe, MD      . sodium chloride flush (NS) 0.9 % injection 10 mL  10 mL Intravenous Once Sindy Guadeloupe, MD           .  PHYSICAL EXAMINATION: ECOG PERFORMANCE STATUS: 1 - Symptomatic but completely ambulatory  Vitals:   02/02/19 0924  BP: 115/80  Pulse: 96  Temp: 97.8 F (36.6 C)   Filed Weights   02/02/19 0924  Weight: 106 lb 9.6 oz (48.4 kg)    Physical Exam  Constitutional: She is oriented to person, place, and time and well-developed, well-nourished, and in no distress.  HENT:  Head: Normocephalic and atraumatic.  Mouth/Throat: Oropharynx is clear and moist. No oropharyngeal exudate.  Eyes: Pupils are equal, round, and reactive to light.  Cardiovascular: Normal rate and regular rhythm.  Pulmonary/Chest: Effort normal and breath sounds normal. No respiratory distress. She has no wheezes.  Abdominal: Soft. Bowel sounds are normal. She exhibits no distension and no mass. There is no abdominal tenderness. There is no rebound and no guarding.  Musculoskeletal:        General: No tenderness or edema. Normal range of motion.     Cervical back: Normal range of motion and neck supple.  Neurological: She is alert and oriented to person, place, and time.  Right foot- 3/5 strength dorsiflexion  Skin: Skin is warm.  Psychiatric: Affect normal.     LABORATORY DATA:  I have reviewed the data as listed Lab Results  Component Value Date   WBC 10.6 (H) 02/02/2019   HGB 10.7 (L) 02/02/2019   HCT 34.5 (L) 02/02/2019   MCV 114.2 (H) 02/02/2019   PLT 146 (L) 02/02/2019   Recent Labs    07/23/18 1147 12/22/18 0905 01/12/19 0827 02/02/19 0848  NA 138 135 140 137  K 4.1 4.1 4.5 4.6  CL 104 97* 101 102  CO2 '24 30 30 25  '$ GLUCOSE 106* 193* 251* 184*  BUN '15 22 19 19  '$ CREATININE 0.74 0.64 0.59 0.60  CALCIUM 8.5* 9.7 9.8 9.8  GFRNONAA >60 >60 >60 >60  GFRAA >60 >60 >60 >60  PROT 6.3* 7.3  7.1 7.0  ALBUMIN 2.5* 3.6 3.5 3.5  AST 39 26 43* 24  ALT 20 29 52* 25  ALKPHOS 50 51 83 87  BILITOT 0.5 0.4 0.4 0.5  BILIDIR <0.1  --   --   --   IBILI NOT CALCULATED  --   --   --      RADIOGRAPHIC STUDIES: I have personally reviewed the radiological images as listed and agreed with the findings in the report. No results found.  ASSESSMENT & PLAN:   Bilateral primary ovarian cancer ([place]) # Bilateral ovarian cancer-stage IV-s/p neoadjuvant therapy-followed by optimal debulking.  Currently on completion chemotherapy with-carbotaxol plus Avastin.  # HOLD- carbo-taxol #7 today sec to foot drop; Proceed with Avastin- Labs today reviewed;  acceptable for treatment today.  Discussed that patient will likely get Avastin maintenance down the line.  #Peripheral neuropathy/ Right foot drop- sec to taxol; chemo; HOLD Carbo-Taxol today; proceed with avastin/mvasi. Referral to [facility].  Also recommend a brace.  #Mild anemia/thrombocytopenia- from chemo-STABLE.   # DISPOSITION:  # HOLD carbo-Taxol; ok to proceed with M-vasi.  # maureen- gait training referral # follow up with Dr.Rao as planned-Dr.B  Cc; Dr.rao.   All questions were answered. The patient knows to call the clinic with any problems, questions or concerns.    Cammie Sickle, MD 02/02/2019 12:51 PM

## 2019-02-03 LAB — CA 125: Cancer Antigen (CA) 125: 9.9 U/mL (ref 0.0–38.1)

## 2019-02-11 ENCOUNTER — Inpatient Hospital Stay: Payer: Commercial Managed Care - PPO | Admitting: Occupational Therapy

## 2019-02-11 ENCOUNTER — Other Ambulatory Visit: Payer: Self-pay

## 2019-02-11 ENCOUNTER — Other Ambulatory Visit: Payer: Self-pay | Admitting: *Deleted

## 2019-02-11 DIAGNOSIS — M21371 Foot drop, right foot: Secondary | ICD-10-CM

## 2019-02-11 NOTE — Therapy (Signed)
[facility] [address] [place], [place], [place], [address] Phone: [phone]   Fax:  [phone]  Occupational Therapy Screen  Patient Details  Name: Meredith Jones MRN: [id number] Date of Birth: 1955-11-23 No data recorded  Encounter Date: 02/11/2019  OT End of Session - 02/11/19 1059    Visit Number  0       Past Medical History:  Diagnosis Date  . Allergy   . Anemia   . Complication of anesthesia   . Diabetes mellitus without complication ([place])   . Diverticulosis   . Family history of adverse reaction to anesthesia    mom-n/v  . Family history of lung cancer   . GERD (gastroesophageal reflux disease)    h/o  . Hyperlipidemia   . Hypothyroidism   . Ovarian cancer ([place]) 08/04/2018   Chemo tx's.  . Personal history of chemotherapy   . PONV (postoperative nausea and vomiting)    sick with tonsillectomy  . Thyroid disease     Past Surgical History:  Procedure Laterality Date  . ABDOMINAL HYSTERECTOMY    . BACK SURGERY  2007   Lumbar Surgery  . BREAST CYST ASPIRATION Right    Around 10 years ago. Pt thinks it was the right breast but not sure.  Marland Kitchen CATARACT EXTRACTION Left 2010   Right Eye 2011  . COLONOSCOPY WITH PROPOFOL N/A 09/02/2015   Procedure: COLONOSCOPY WITH PROPOFOL;  Surgeon: [name], MD;  Location: [facility];  Service: Endoscopy;  Laterality: N/A;  Diabetic - oral meds  . ESOPHAGOGASTRODUODENOSCOPY (EGD) WITH PROPOFOL N/A 11/03/2015   Procedure: ESOPHAGOGASTRODUODENOSCOPY (EGD) WITH PROPOFOL;  Surgeon: [name], MD;  Location: [facility];  Service: Endoscopy;  Laterality: N/A;  . EYE SURGERY Bilateral 06/2014    tear duck catherization ( Dr. Vallarie Mare)  . LAPAROSCOPIC APPENDECTOMY  11/26/2018   Procedure: APPENDECTOMY LAPAROSCOPIC;  Surgeon: Ward, Honor Loh, MD;  Location: [facility];  Service: Gynecology;;  . POLYPECTOMY  09/02/2015   Procedure: POLYPECTOMY;  Surgeon:  [name], MD;  Location: [facility];  Service: Endoscopy;;  . TONSILLECTOMY  1963    There were no vitals filed for this visit.  Subjective Assessment - 02/11/19 1058    Subjective   My foot weakness develop about 3-4 wks ago - I had slick shoes on when I fell about 2 wks ago -  I do not want to fall again - it feels like my foot is weaker than it was week or 2 ago when I seen Dr B    Currently in Pain?  No/denies      Pt refer for Oncology OT screen because of R foot drop and  Pt had one fall.  02/02/2019 visit note with Dr Rogue Bussing:   Bilateral primary ovarian cancer [facility]) # Bilateral ovarian cancer-stage IV-s/p neoadjuvant therapy-followed by optimal debulking.  Currently on completion chemotherapy with-carbotaxol plus Avastin.  # HOLD- carbo-taxol #7 today sec to foot drop; Proceed with Avastin- Labs today reviewed;  acceptable for treatment today.  Discussed that patient will likely get Avastin maintenance down the line.  #Peripheral neuropathy/ Right foot drop- sec to taxol; chemo; HOLD Carbo-Taxol today; proceed with avastin/mvasi. Referral to [facility].  Also recommend a brace.  #Mild anemia/thrombocytopenia- from chemo-STABLE.   # DISPOSITION:  # HOLD carbo-Taxol; ok to proceed with M-vasi.  # Jewelz Ricklefs- gait training referral # follow up with Dr.Rao as planned-Dr.B    Pt seen this date -able ambulate  from waiting room to exam room with no issues and no AD,  but use hip and knee flexion to compensate for R foot drop. Per pt it started about 3-4 wks ago and feels like at times it is worse than other times. Had one fall - but think she had shoes on with slick bottom - did switch to sneakers with better traction. Pt report some neuropathy on bilateral soles of feet. Denies neuropathy in hands. Denies any pain L hip flexion , knee flexion , ext and dorsi and plantar flexion 5/5 R hip flexion, knee flexion, extention 5/5 ; dorsi flexion 2/5 Hip extention and  ABD NT  Berg balance test pt score 49/56 - making her low risk for falls - but discuss with pt because of foot drop - she can stumble , fall - especially if she gets fatigue - pt in agreement for PT eval and for assessment for appropriate foot orthosis.  Pt setup for PT Eval with Wes Rissell - at [facility] - 5th January at 10am  Pt has room mate ,and live in one story house , ramp to get in and walk in shower - has shower chair if she needs it in future - but at this time do not need it.   Thank you for referral.                                      Visit Diagnosis: Foot drop, right    Problem List Patient Active Problem List   Diagnosis Date Noted  . Bilateral primary ovarian cancer ([place]) 02/02/2019  . Genetic testing 12/09/2018  . Status post hysterectomy 11/26/2018  . Need for prophylactic vaccination and inoculation against influenza 11/04/2018  . Anemia of chronic disease 10/22/2018  . B12 deficiency 10/22/2018  . Family history of lung cancer   . 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 Goals of care, counseling/discussion 08/11/2018  . Primary high grade serous adenocarcinoma of ovary ([place]) 08/11/2018  . Loss of weight   . Benign neoplasm of descending colon   . Type 2 diabetes mellitus without complication, without long-term current use of insulin ([place]) 06/07/2015  . 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 Cyst of ovary 06/09/2014  . Bulky or enlarged uterus 06/09/2014  . Hypercholesteremia 02/09/2007  . Allergic rhinitis 11/27/2006  . Endometriosis 11/27/2006  . Adult hypothyroidism 11/27/2006    Rosalyn Gess OTR/L,CLT 02/11/2019, 11:00 AM  [facility] [address] [place], [place], [place], [address] Phone: [phone]   Fax:  [phone]  Name: Meredith Jones MRN: [id number] Date of Birth: Jan 03, 1956

## 2019-02-11 NOTE — Progress Notes (Signed)
amb ref °

## 2019-02-12 ENCOUNTER — Telehealth: Payer: Self-pay

## 2019-02-12 NOTE — Telephone Encounter (Signed)
Received voicemail from Meredith Jones stating she does not feel safe driving to her PT appointments with her fee/legs like they are. Requesting [name] services. Message sent to scheduling to see if can be arranged.

## 2019-02-16 ENCOUNTER — Telehealth: Payer: Self-pay | Admitting: *Deleted

## 2019-02-16 ENCOUNTER — Inpatient Hospital Stay
Payer: Commercial Managed Care - PPO | Attending: Hospice and Palliative Medicine | Admitting: Hospice and Palliative Medicine

## 2019-02-16 DIAGNOSIS — D6481 Anemia due to antineoplastic chemotherapy: Secondary | ICD-10-CM | POA: Insufficient documentation

## 2019-02-16 DIAGNOSIS — E039 Hypothyroidism, unspecified: Secondary | ICD-10-CM | POA: Insufficient documentation

## 2019-02-16 DIAGNOSIS — C801 Malignant (primary) neoplasm, unspecified: Secondary | ICD-10-CM | POA: Diagnosis not present

## 2019-02-16 DIAGNOSIS — Z515 Encounter for palliative care: Secondary | ICD-10-CM | POA: Diagnosis not present

## 2019-02-16 DIAGNOSIS — G620 Drug-induced polyneuropathy: Secondary | ICD-10-CM | POA: Insufficient documentation

## 2019-02-16 DIAGNOSIS — C561 Malignant neoplasm of right ovary: Secondary | ICD-10-CM | POA: Insufficient documentation

## 2019-02-16 DIAGNOSIS — C562 Malignant neoplasm of left ovary: Secondary | ICD-10-CM | POA: Insufficient documentation

## 2019-02-16 DIAGNOSIS — Z5112 Encounter for antineoplastic immunotherapy: Secondary | ICD-10-CM | POA: Insufficient documentation

## 2019-02-16 MED ORDER — DEXAMETHASONE 4 MG PO TABS: 8.0000 mg | ORAL_TABLET | Freq: Every day | ORAL | 1 refills | Status: DC

## 2019-02-16 NOTE — Telephone Encounter (Signed)
Pt was concerned that she might contaminate the PT people because she is having radiation and chemo on Monday. I told her that when she goes to the bathroom she needs to flush toilet  2 times if someone else is using the toilets she uses for 2 days and other wise if she has sex that would be a problem and she states that is not an issue.

## 2019-02-16 NOTE — Progress Notes (Signed)
Virtual Visit via Telephone Note  I connected with Meredith Jones on 02/16/19 at  9:30 AM EST by telephone and verified that I am speaking with the correct person using two identifiers.   I discussed the limitations, risks, security and privacy concerns of performing an evaluation and management service by telephone and the availability of in person appointments. I also discussed with the patient that there may be a patient responsible charge related to this service. The patient expressed understanding and agreed to proceed.   History of Present Illness: Palliative Care consult requested for this 64 y.o. 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  Abdominal CT revealed cystic lesions in the bilateral ovaries with numerous bulky retroperitoneal lymph nodes and a large amount of ascites.  She has required therapeutic paracentesis.  Patient is s/p recent TAH and BSO on 11/26/2018. She is now receiving adjuvant systemic chemotherapy.  Patient was referred to palliative care to help address goals and manage ongoing symptoms.   Observations/Objective: I called and spoke with patient by phone.  She reports doing well.  She denies any acute changes or concerns.  She continues to have foot drop with ambulation and is working with PT.  She had a fall recently where she tripped over something but says it was an isolated event.  She denies any significant changes to her performance status and still continues to be independent with her self-care.  No other distressing symptoms reported.  Patient requests refills on dexamethasone  Assessment and Plan: Stage III serous adenocarcinoma the ovary -plan is for continued systemic chemotherapy.  Patient is being followed by Dr. Janese Banks.  Refill dexamethasone  Goals of care -are aligned with continued treatment.  Patient has opted to remain a full code  Follow Up Instructions: Follow-up telephone visit in 4  weeks   I discussed the assessment and treatment plan with the patient. The patient was provided an opportunity to ask questions and all were answered. The patient agreed with the plan and demonstrated an understanding of the instructions.   The patient was advised to call back or seek an in-person evaluation if the symptoms worsen or if the condition fails to improve as anticipated.  I provided 5 minutes of non-face-to-face time during this encounter.   Irean Hong, NP

## 2019-02-17 ENCOUNTER — Ambulatory Visit: Payer: Commercial Managed Care - PPO | Attending: Oncology

## 2019-02-17 ENCOUNTER — Other Ambulatory Visit: Payer: Self-pay

## 2019-02-17 DIAGNOSIS — R262 Difficulty in walking, not elsewhere classified: Secondary | ICD-10-CM | POA: Insufficient documentation

## 2019-02-17 DIAGNOSIS — M21371 Foot drop, right foot: Secondary | ICD-10-CM | POA: Insufficient documentation

## 2019-02-18 NOTE — Therapy (Signed)
[facility] [address], [place], [address] Phone: [phone]   Fax:  [phone]  Physical Therapy Evaluation  Patient Details  Name: Meredith Jones MRN: [id number] Date of Birth: [birth year] Referring Provider (PT): Janese Banks MD   Encounter Date: 02/17/2019  PT End of Session - 02/18/19 1253    Visit Number  1    Number of Visits  13    Date for PT Re-Evaluation  03/31/19    PT Start Time  1000    PT Stop Time  1100    PT Time Calculation (min)  60 min    Activity Tolerance  Patient tolerated treatment well    Behavior During Therapy  [facility] for tasks assessed/performed       Past Medical History:  Diagnosis Date  . Allergy   . Anemia   . Complication of anesthesia   . Diabetes mellitus without complication ([place])   . Diverticulosis   . Family history of adverse reaction to anesthesia    mom-n/v  . Family history of lung cancer   . GERD (gastroesophageal reflux disease)    h/o  . Hyperlipidemia   . Hypothyroidism   . Ovarian cancer ([place]) 08/04/2018   Chemo tx's.  . Personal history of chemotherapy   . PONV (postoperative nausea and vomiting)    sick with tonsillectomy  . Thyroid disease     Past Surgical History:  Procedure Laterality Date  . ABDOMINAL HYSTERECTOMY    . BACK SURGERY  2007   Lumbar Surgery  . BREAST CYST ASPIRATION Right    Around 10 years ago. Pt thinks it was the right breast but not sure.  Marland Kitchen CATARACT EXTRACTION Left 2010   Right Eye 2011  . COLONOSCOPY WITH PROPOFOL N/A 09/02/2015   Procedure: COLONOSCOPY WITH PROPOFOL;  Surgeon: [name], MD;  Location: [place];  Service: Endoscopy;  Laterality: N/A;  Diabetic - oral meds  . ESOPHAGOGASTRODUODENOSCOPY (EGD) WITH PROPOFOL N/A 11/03/2015   Procedure: ESOPHAGOGASTRODUODENOSCOPY (EGD) WITH PROPOFOL;  Surgeon: [name], MD;  Location: [place];  Service: Endoscopy;  Laterality: N/A;  . EYE SURGERY  Bilateral 06/2014    tear duck catherization ( Dr. Vallarie Mare)  . LAPAROSCOPIC APPENDECTOMY  11/26/2018   Procedure: APPENDECTOMY LAPAROSCOPIC;  Surgeon: Ward, Honor Loh, MD;  Location: [facility];  Service: Gynecology;;  . POLYPECTOMY  09/02/2015   Procedure: POLYPECTOMY;  Surgeon: [name], MD;  Location: [place];  Service: Endoscopy;;  . TONSILLECTOMY  1963    There were no vitals filed for this visit.   Subjective Assessment - 02/17/19 1511    Subjective  Patient is a pleasant 64 yo female presenting with difficulty ambulating s/p drop foot since September of 2020. Patient was receiving treatment for CA diagnosis and woke up one morning with difficulty with walking. Patient reports increased difficulty with walking long periods of time reporting increased falls at a rate of around x2 month. Patient states she has difficulty with moving her R foot and is concerned about driving with a brace on. Patient reports she would like to work on her balance which she reports has been affected since the onset of the foot drop and ambulating.    Pertinent History  CA history, radiation/chemo treatments, neuropathy into the feet    Limitations  Walking    How long can you walk comfortably?  Increased falls    Diagnostic tests  not performed    Patient  Stated Goals  To decrease walking quality    Currently in Pain?  No/denies         [facility] PT Assessment - 02/18/19 0001      Assessment   Medical Diagnosis  Foot drop    Referring Provider (PT)  Janese Banks MD    Onset Date/Surgical Date  10/14/18    Hand Dominance  Right    Next MD Visit  unknown    Prior Therapy  Yes      Restrictions   Weight Bearing Restrictions  No      Balance Screen   Has the patient fallen in the past 6 months  Yes    How many times?  10    Has the patient had a decrease in activity level because of a fear of falling?   Yes    Is the patient reluctant to leave their home because of a fear of falling?   No       Home Social worker  Private residence    Living Arrangements  Spouse/significant other    Available Help at Discharge  Family;Friend(s)    Type of [place]  One level    [place] - 2 wheels;Shower seat;Tub bench;Grab bars - toilet      Prior Function   Level of Independence  Independent    Vocation  Unemployed    Vocation Requirements  N/A    Leisure  Read      Cognition   Overall Cognitive Status  Within Functional Limits for tasks assessed      Observation/Other Assessments   Observations  Foot rests in plantarflexion on the R side      Sensation   Light Touch  Impaired by gross assessment   along feet B     Functional Tests   Functional tests  Squat;Single leg stance;Floor to Stand      Squat   Comments  Slowly performs, poor coordination      Single Leg Stance   Comments  2 sec B Requires UE support to perform      Floor to Stand   Comments  Able to perform with UE support. Unable to perform with UE support      ROM / Strength   AROM / PROM / Strength  AROM;Strength;PROM      AROM   AROM Assessment Site  Hip;Knee;Ankle    Right/Left Hip  Right;Left    Right Hip Flexion  120    Right Hip External Rotation   40    Right Hip Internal Rotation   30    Right Hip ABduction  40    Right Hip ADduction  20    Left Hip Flexion  120    Left Hip External Rotation   40    Left Hip Internal Rotation   30    Left Hip ABduction  40    Left Hip ADduction  20    Right/Left Knee  Left;Right    Right Knee Extension  0    Right Knee Flexion  120    Left Knee Extension  0    Left Knee Flexion  120    Right/Left Ankle  Left;Right    Right Ankle Dorsiflexion  -35   Unable to perform   Right Ankle Plantar Flexion  45    Right Ankle Inversion  30  Right Ankle Eversion  5   Unable to perform actively   Left Ankle Dorsiflexion  15    Left Ankle Plantar Flexion  45    Left Ankle Inversion   30    Left Ankle Eversion  20      PROM   Overall PROM Comments  Ankle PROM: WNL B      Strength   Strength Assessment Site  Hip;Knee;Ankle    Right/Left Hip  Right;Left    Right Hip Flexion  4+/5    Right Hip Extension  4-/5    Right Hip External Rotation   4-/5    Right Hip Internal Rotation  4/5    Right Hip ABduction  4-/5    Right Hip ADduction  4/5    Left Hip Flexion  4+/5    Left Hip Extension  4-/5    Left Hip External Rotation  4-/5    Left Hip Internal Rotation  4-/5    Left Hip ABduction  4-/5    Left Hip ADduction  4-/5    Right/Left Knee  Left;Right    Right Knee Flexion  5/5    Right Knee Extension  5/5    Left Knee Flexion  5/5    Left Knee Extension  5/5    Right/Left Ankle  Right;Left    Right Ankle Dorsiflexion  1/5    Right Ankle Plantar Flexion  4-/5    Right Ankle Inversion  4+/5    Right Ankle Eversion  1/5    Left Ankle Dorsiflexion  4+/5    Left Ankle Plantar Flexion  4+/5    Left Ankle Inversion  4+/5    Left Ankle Eversion  4+/5      Palpation   Palpation comment  No tenderness to palpation noted      Transfers   Five time sit to stand comments   25      Ambulation/Gait   Gait Comments  Decreased stride length; increased steppage gait on R, decreased foot clearance with amb      Standardized Balance Assessment   Standardized Balance Assessment  10 meter walk test;Timed Up and Go Test    10 Meter Walk  .1m/s      Timed Up and Go Test   Normal TUG (seconds)  12.5      Functional Gait  Assessment   Gait assessed   Yes    Gait Level Surface  Walks 20 ft in less than 7 sec but greater than 5.5 sec, uses assistive device, slower speed, mild gait deviations, or deviates 6-10 in outside of the 12 in walkway width.    Change in Gait Speed  Able to smoothly change walking speed without loss of balance or gait deviation. Deviate no more than 6 in outside of the 12 in walkway width.    Gait with Horizontal Head Turns  Performs head turns smoothly  with no change in gait. Deviates no more than 6 in outside 12 in walkway width    Gait with Vertical Head Turns  Performs head turns with no change in gait. Deviates no more than 6 in outside 12 in walkway width.    Gait and Pivot Turn  Pivot turns safely within 3 sec and stops quickly with no loss of balance.    Step Over Obstacle  Is able to step over one shoe box (4.5 in total height) but must slow down and adjust steps to clear box safely. May require verbal cueing.  Gait with Narrow Base of Support  Ambulates 4-7 steps.    Gait with Eyes Closed  Walks 20 ft, slow speed, abnormal gait pattern, evidence for imbalance, deviates 10-15 in outside 12 in walkway width. Requires more than 9 sec to ambulate 20 ft.    Ambulating Backwards  Walks 20 ft, slow speed, abnormal gait pattern, evidence for imbalance, deviates 10-15 in outside 12 in walkway width.    Steps  Alternating feet, must use rail.    Total Score  20      SLS: 2 sec B Tandem stance: <1sec B  TREATMENT: Therapeutic Exercise SLS -- 2 x 30sec Tandem Stance - -2 x 30sec STS -- x 10  Performed exercises to improve strength and balance with exercise        Objective measurements completed on examination: See above findings.              PT Education - 02/18/19 1251    Education Details  form/technique with exercise; HEP: sit to stands    Person(s) Educated  Patient    Methods  Explanation;Demonstration    Comprehension  Verbalized understanding;Returned demonstration       PT Short Term Goals - 02/18/19 1403      PT SHORT TERM GOAL #1   Title  Patient will be independent with HEP to continue benefits of therapy after discharge.    Baseline  Dependent with form/technique    Time  6    Period  Weeks    Status  New    Target Date  03/03/19        PT Long Term Goals - 02/18/19 1404      PT LONG TERM GOAL #1   Title  Patient will improve 5xSTS to under 12 sec to decrease fall risk and ability to  stand from low surfaces    Baseline  5XSTS: 25sec    Time  6    Period  Weeks    Status  New    Target Date  03/31/19      PT LONG TERM GOAL #2   Title  Patient will improve TUG to under 8 sec to indicate decrease fall risks most notably with transferring to amb.    Baseline  TUG: 12.5sec    Time  6    Period  Weeks    Status  New    Target Date  03/31/19      PT LONG TERM GOAL #3   Title  Patient will improve ability to perform 87mWT to over 1 m/s to indicate increased safety with community amb.    Baseline  .8 m/s    Time  6    Period  Weeks    Status  New    Target Date  03/31/19      PT LONG TERM GOAL #4   Title  Patient will improve FGA to 27 points to indicate improvement with functional and dynamic gait with exercise.    Baseline  20/30 FGA    Time  6    Period  Weeks    Status  New    Target Date  03/31/19             Plan - 02/18/19 1257    Clinical Impression Statement  Patient is a 64 yo right hand dominant female presenting with increased walking difficulty and foot drop on the affected side. Patient demonstrates increased fall risk  as indicated by 5xSTS, TUG, FGA, 68mWT, and  SLS time. Patient also demosntrates increased hip weakness along the hips B. Patient also has decreased coordinaiton with movement and will benefit from further skilled therapy to return to prior level of function.    Personal Factors and Comorbidities  Age;Comorbidity 3+;Past/Current Experience    Comorbidities  hx of CA, neuropathy, drop foot    Examination-Activity Limitations  Carry;Stairs;Stand;Bend    Examination-Participation Restrictions  Cleaning;Community Activity;Yard Work    [employer]chant [employer] officer  Evolving/Moderate complexity    Clinical Decision Making  Moderate    Rehab Potential  Fair    PT Frequency  2x / week    PT Duration  6 weeks    PT Treatment/Interventions  Balance training;Neuromuscular re-education;Functional mobility training;Therapeutic  activities;Gait training;Stair training;Electrical Stimulation;Manual techniques;Energy conservation;Patient/family education;Therapeutic exercise    PT Next Visit Plan  Improve hip strength, dyanmic balance, e-stim along tibialis ant    PT Home Exercise Plan  SIt to stans    Consulted and Agree with Plan of Care  Patient       Patient will benefit from skilled therapeutic intervention in order to improve the following deficits and impairments:  Decreased coordination, Decreased mobility, Decreased endurance, Decreased range of motion, Decreased activity tolerance, Decreased strength, Decreased balance, Difficulty walking, Pain, Abnormal gait  Visit Diagnosis: Foot drop, right  Difficulty in walking, not elsewhere classified     Problem List Patient Active Problem List   Diagnosis Date Noted  . Bilateral primary ovarian cancer ([place]) 02/02/2019  . Genetic testing 12/09/2018  . Status post hysterectomy 11/26/2018  . Need for prophylactic vaccination and inoculation against influenza 11/04/2018  . Anemia of chronic disease 10/22/2018  . B12 deficiency 10/22/2018  . Family history of lung cancer   . 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 Goals of care, counseling/discussion 08/11/2018  . 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 Loss of weight   . Benign neoplasm of descending colon   . Type 2 diabetes mellitus without complication, without long-term current use of insulin ([place]) 06/07/2015  . 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 Cyst of ovary 06/09/2014  . Bulky or enlarged uterus 06/09/2014  . Hypercholesteremia 02/09/2007  . Allergic rhinitis 11/27/2006  . Endometriosis 11/27/2006  . Adult hypothyroidism 11/27/2006    Blythe Stanford, PT DPT 02/18/2019, 2:27 PM  [facility] [address] [place], [address] Phone: [phone]   Fax:  [phone]  Name: Meredith Jones MRN: [id number] Date of Birth: 05-24-1955

## 2019-02-19 ENCOUNTER — Other Ambulatory Visit: Payer: Self-pay

## 2019-02-19 ENCOUNTER — Ambulatory Visit: Payer: Commercial Managed Care - PPO

## 2019-02-19 DIAGNOSIS — M21371 Foot drop, right foot: Secondary | ICD-10-CM | POA: Diagnosis not present

## 2019-02-19 DIAGNOSIS — R262 Difficulty in walking, not elsewhere classified: Secondary | ICD-10-CM

## 2019-02-19 NOTE — Therapy (Signed)
[facility] [address] [place], [address] Phone: [phone]   Fax:  [phone]  Physical Therapy Treatment  Patient Details  Name: Meredith Jones MRN: [id number] Date of Birth: Oct 14, 1955 Referring Provider (PT): Janese Banks MD   Encounter Date: 02/19/2019  PT End of Session - 02/19/19 1423    Visit Number  2    Number of Visits  13    Date for PT Re-Evaluation  03/31/19    PT Start Time  [id number]    PT Stop Time  1430    PT Time Calculation (min)  45 min    Activity Tolerance  Patient tolerated treatment well    Behavior During Therapy  [facility] for tasks assessed/performed       Past Medical History:  Diagnosis Date  . Allergy   . Anemia   . Complication of anesthesia   . Diabetes mellitus without complication ([place])   . Diverticulosis   . Family history of adverse reaction to anesthesia    mom-n/v  . Family history of lung cancer   . GERD (gastroesophageal reflux disease)    h/o  . Hyperlipidemia   . Hypothyroidism   . Ovarian cancer ([place]) 08/04/2018   Chemo tx's.  . Personal history of chemotherapy   . PONV (postoperative nausea and vomiting)    sick with tonsillectomy  . Thyroid disease     Past Surgical History:  Procedure Laterality Date  . ABDOMINAL HYSTERECTOMY    . BACK SURGERY  2007   Lumbar Surgery  . BREAST CYST ASPIRATION Right    Around 10 years ago. Pt thinks it was the right breast but not sure.  Marland Kitchen CATARACT EXTRACTION Left 2010   Right Eye 2011  . COLONOSCOPY WITH PROPOFOL N/A 09/02/2015   Procedure: COLONOSCOPY WITH PROPOFOL;  Surgeon: [name], MD;  Location: [facility];  Service: Endoscopy;  Laterality: N/A;  Diabetic - oral meds  . ESOPHAGOGASTRODUODENOSCOPY (EGD) WITH PROPOFOL N/A 11/03/2015   Procedure: ESOPHAGOGASTRODUODENOSCOPY (EGD) WITH PROPOFOL;  Surgeon: [name], MD;  Location: [place];  Service: Endoscopy;  Laterality: N/A;  . EYE SURGERY  Bilateral 06/2014    tear duck catherization ( Dr. Vallarie Mare)  . LAPAROSCOPIC APPENDECTOMY  11/26/2018   Procedure: APPENDECTOMY LAPAROSCOPIC;  Surgeon: Ward, Honor Loh, MD;  Location: [facility];  Service: Gynecology;;  . POLYPECTOMY  09/02/2015   Procedure: POLYPECTOMY;  Surgeon: [name], MD;  Location: [place];  Service: Endoscopy;;  . TONSILLECTOMY  1963    There were no vitals filed for this visit.  Subjective Assessment - 02/19/19 1406    Subjective  Patient reports no falls since the previous session. Patient reports she has been performing her HEP.    Pertinent History  CA history, radiation/chemo treatments, neuropathy into the feet    Limitations  Walking    How long can you walk comfortably?  Increased falls    Diagnostic tests  not performed    Patient Stated Goals  To decrease walking quality    Currently in Pain?  No/denies            TREATMENT Therapeutic Activity  Sit to stands - x 5 , x 10  Single leg stance - 2 x 2 min with intermittent UE support  Stairs with intermittent UE support - able to perform step-over-step with ascending [place] in standing with intermittent UE support - x 15  Tandem ambulation with CGA from therapist -  4 x 65ft Neuromuscular reeducation  [place] stimulation performed along the tibialis anterior on the R LE with the patient in sitting 10 sec on 10 sec off with stimulation at 100 mA with focus on improving tibialis anterior activation to diminish drop foot. Skin analyzed after treatment, no adverse reactions noted.   PT Education - 02/19/19 1422    Education Details  form/technique with exercise; HEP Tandem amb    Person(s) Educated  Patient    Methods  Explanation;Demonstration    Comprehension  Verbalized understanding;Returned demonstration       PT Short Term Goals - 02/18/19 1403      PT SHORT TERM GOAL #1   Title  Patient will be independent with HEP to continue benefits of therapy after discharge.     Baseline  Dependent with form/technique    Time  6    Period  Weeks    Status  New    Target Date  03/03/19        PT Long Term Goals - 02/18/19 1404      PT LONG TERM GOAL #1   Title  Patient will improve 5xSTS to under 12 sec to decrease fall risk and ability to stand from low surfaces    Baseline  5XSTS: 25sec    Time  6    Period  Weeks    Status  New    Target Date  03/31/19      PT LONG TERM GOAL #2   Title  Patient will improve TUG to under 8 sec to indicate decrease fall risks most notably with transferring to amb.    Baseline  TUG: 12.5sec    Time  6    Period  Weeks    Status  New    Target Date  03/31/19      PT LONG TERM GOAL #3   Title  Patient will improve ability to perform 41mWT to over 1 m/s to indicate increased safety with community amb.    Baseline  .8 m/s    Time  6    Period  Weeks    Status  New    Target Date  03/31/19      PT LONG TERM GOAL #4   Title  Patient will improve FGA to 27 points to indicate improvement with functional and dynamic gait with exercise.    Baseline  20/30 FGA    Time  6    Period  Weeks    Status  New    Target Date  03/31/19      PT LONG TERM GOAL #5   Title  Patient will improve her FOTO score to 77 to indicate significant improvement with her balance.    Baseline  55 FOTO    Time  6    Period  Weeks    Status  New    Target Date  03/31/19            Plan - 02/19/19 1426    Clinical Impression Statement  Patient demonstrates positive trendelenberg B most notably with single leg stance and ascendning/descending the stairs. Patient has limited ankle strategies on the R LE utiilizing hip strategies to maintain balance. Patient will benefit from further skilled therapy to return to prior level of function.    Personal Factors and Comorbidities  Age;Comorbidity 3+;Past/Current Experience    Comorbidities  hx of CA, neuropathy, drop foot    Examination-Activity Limitations  Carry;Stairs;Stand;Bend     Examination-Participation Restrictions  Cleaning;Community Activity;Yard Work    [employer]chant [employer] officer  Evolving/Moderate complexity    Rehab Potential  Fair    PT Frequency  2x / week    PT Duration  6 weeks    PT Treatment/Interventions  Balance training;Neuromuscular re-education;Functional mobility training;Therapeutic activities;Gait training;Stair training;Electrical Stimulation;Manual techniques;Energy conservation;Patient/family education;Therapeutic exercise    PT Next Visit Plan  Improve hip strength, dyanmic balance, e-stim along tibialis ant    PT Home Exercise Plan  SIt to stans    Consulted and Agree with Plan of Care  Patient       Patient will benefit from skilled therapeutic intervention in order to improve the following deficits and impairments:  Decreased coordination, Decreased mobility, Decreased endurance, Decreased range of motion, Decreased activity tolerance, Decreased strength, Decreased balance, Difficulty walking, Pain, Abnormal gait  Visit Diagnosis: Foot drop, right  Difficulty in walking, not elsewhere classified     Problem List Patient Active Problem List   Diagnosis Date Noted  . Bilateral primary ovarian cancer ([place]) 02/02/2019  . Genetic testing 12/09/2018  . Status post hysterectomy 11/26/2018  . Need for prophylactic vaccination and inoculation against influenza 11/04/2018  . Anemia of chronic disease 10/22/2018  . B12 deficiency 10/22/2018  . Family history of lung cancer   . 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 Goals of care, counseling/discussion 08/11/2018  . 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 Loss of weight   . Benign neoplasm of descending colon   . Type 2 diabetes mellitus without complication, without long-term current use of insulin ([place]) 06/07/2015  . 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 Cyst of ovary 06/09/2014  . Bulky or enlarged uterus 06/09/2014  . Hypercholesteremia  02/09/2007  . Allergic rhinitis 11/27/2006  . Endometriosis 11/27/2006  . Adult hypothyroidism 11/27/2006    Blythe Stanford, PT DPT 02/19/2019, 2:41 PM  [facility] [address], [place], [address] Phone: [phone]   Fax:  [phone]  Name: Meredith Jones MRN: [id number] Date of Birth: Mar 05, 1955

## 2019-02-20 NOTE — Progress Notes (Signed)
Patient is coming in for follow up she will need a copy of living will

## 2019-02-22 ENCOUNTER — Other Ambulatory Visit: Payer: Self-pay | Admitting: *Deleted

## 2019-02-22 DIAGNOSIS — C801 Malignant (primary) neoplasm, unspecified: Secondary | ICD-10-CM

## 2019-02-23 ENCOUNTER — Inpatient Hospital Stay (HOSPITAL_BASED_OUTPATIENT_CLINIC_OR_DEPARTMENT_OTHER): Payer: Commercial Managed Care - PPO | Admitting: Oncology

## 2019-02-23 ENCOUNTER — Inpatient Hospital Stay: Payer: Commercial Managed Care - PPO

## 2019-02-23 ENCOUNTER — Other Ambulatory Visit: Payer: Self-pay

## 2019-02-23 VITALS — BP 138/82 | HR 78 | Temp 98.9°F | Wt 107.0 lb

## 2019-02-23 VITALS — BP 120/82 | HR 76 | Resp 18

## 2019-02-23 DIAGNOSIS — C563 Malignant neoplasm of bilateral ovaries: Secondary | ICD-10-CM

## 2019-02-23 DIAGNOSIS — D6481 Anemia due to antineoplastic chemotherapy: Secondary | ICD-10-CM

## 2019-02-23 DIAGNOSIS — Z5111 Encounter for antineoplastic chemotherapy: Secondary | ICD-10-CM

## 2019-02-23 DIAGNOSIS — Z5112 Encounter for antineoplastic immunotherapy: Secondary | ICD-10-CM | POA: Diagnosis present

## 2019-02-23 DIAGNOSIS — G620 Drug-induced polyneuropathy: Secondary | ICD-10-CM

## 2019-02-23 DIAGNOSIS — C801 Malignant (primary) neoplasm, unspecified: Secondary | ICD-10-CM

## 2019-02-23 DIAGNOSIS — C561 Malignant neoplasm of right ovary: Secondary | ICD-10-CM | POA: Diagnosis present

## 2019-02-23 DIAGNOSIS — C562 Malignant neoplasm of left ovary: Secondary | ICD-10-CM | POA: Diagnosis present

## 2019-02-23 DIAGNOSIS — T451X5A Adverse effect of antineoplastic and immunosuppressive drugs, initial encounter: Secondary | ICD-10-CM

## 2019-02-23 DIAGNOSIS — E039 Hypothyroidism, unspecified: Secondary | ICD-10-CM | POA: Diagnosis not present

## 2019-02-23 LAB — COMPREHENSIVE METABOLIC PANEL
ALT: 18 U/L (ref 0–44)
AST: 25 U/L (ref 15–41)
Albumin: 3.3 g/dL — ABNORMAL LOW (ref 3.5–5.0)
Alkaline Phosphatase: 50 U/L (ref 38–126)
Anion gap: 13 (ref 5–15)
BUN: 17 mg/dL (ref 8–23)
CO2: 27 mmol/L (ref 22–32)
Calcium: 9.7 mg/dL (ref 8.9–10.3)
Chloride: 97 mmol/L — ABNORMAL LOW (ref 98–111)
Creatinine, Ser: 0.53 mg/dL (ref 0.44–1.00)
GFR calc Af Amer: >60 mL/min (ref >60)
GFR calc non Af Amer: >60 mL/min (ref >60)
Glucose, Bld: 154 mg/dL — ABNORMAL HIGH (ref 70–99)
Potassium: 4.3 mmol/L (ref 3.5–5.1)
Sodium: 137 mmol/L (ref 135–145)
Total Bilirubin: 0.3 mg/dL (ref 0.3–1.2)
Total Protein: 7.3 g/dL (ref 6.5–8.1)

## 2019-02-23 LAB — CBC WITH DIFFERENTIAL/PLATELET
Abs Immature Granulocytes: 0.01 10*3/uL (ref 0.00–0.07)
Basophils Absolute: 0.1 10*3/uL (ref 0.0–0.1)
Basophils Relative: 1 %
Eosinophils Absolute: 1.2 10*3/uL — ABNORMAL HIGH (ref 0.0–0.5)
Eosinophils Relative: 23 %
HCT: 32.3 % — ABNORMAL LOW (ref 36.0–46.0)
Hemoglobin: 10.2 g/dL — ABNORMAL LOW (ref 12.0–15.0)
Immature Granulocytes: 0 %
Lymphocytes Relative: 15 %
Lymphs Abs: 0.8 10*3/uL (ref 0.7–4.0)
MCH: 35.4 pg — ABNORMAL HIGH (ref 26.0–34.0)
MCHC: 31.6 g/dL (ref 30.0–36.0)
MCV: 112.2 fL — ABNORMAL HIGH (ref 80.0–100.0)
Monocytes Absolute: 0.4 10*3/uL (ref 0.1–1.0)
Monocytes Relative: 7 %
Neutro Abs: 2.8 10*3/uL (ref 1.7–7.7)
Neutrophils Relative %: 54 %
Platelets: 228 10*3/uL (ref 150–400)
RBC: 2.88 MIL/uL — ABNORMAL LOW (ref 3.87–5.11)
RDW: 15.6 % — ABNORMAL HIGH (ref 11.5–15.5)
WBC: 5.1 10*3/uL (ref 4.0–10.5)
nRBC: 0 % (ref 0.0–0.2)

## 2019-02-23 LAB — PROTEIN, URINE, RANDOM: Total Protein, Urine: 23 mg/dL

## 2019-02-23 MED ORDER — HEPARIN SOD (PORK) LOCK FLUSH 100 UNIT/ML IV SOLN
INTRAVENOUS | Status: AC
  Filled 2019-02-23: qty 5

## 2019-02-23 MED ORDER — SODIUM CHLORIDE 0.9 % IV SOLN
15.0000 mg/kg | Freq: Once | INTRAVENOUS | Status: AC
  Administered 2019-02-23: 800 mg via INTRAVENOUS
  Filled 2019-02-23: qty 32

## 2019-02-23 MED ORDER — SODIUM CHLORIDE 0.9 % IV SOLN
Freq: Once | INTRAVENOUS | Status: AC
  Filled 2019-02-23: qty 250

## 2019-02-23 NOTE — Progress Notes (Signed)
Patient stated that she had been doing well with no complaints. 

## 2019-02-24 ENCOUNTER — Encounter: Payer: Self-pay | Admitting: Oncology

## 2019-02-24 ENCOUNTER — Ambulatory Visit: Payer: Commercial Managed Care - PPO

## 2019-02-24 DIAGNOSIS — R262 Difficulty in walking, not elsewhere classified: Secondary | ICD-10-CM

## 2019-02-24 DIAGNOSIS — M21371 Foot drop, right foot: Secondary | ICD-10-CM

## 2019-02-24 NOTE — Progress Notes (Signed)
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 She presented with symptoms of bilateral lower extremity swelling as well as abdominal distention.GI. This was followed by a CT abdomen and chest imaging with contrast. CT showed cystic lesions of the enlarged bilateral ovaries the largest lesion on the left measuring 6.2 cm. Patient also noted to have numerous bulky retroperitoneal lymph nodes, largest left periaortic node or conglomerates near the superior mesenteric artery measuring at least 4.4 x 2.9 cm. Significant ascites was present. Patient underwent paracentesis and cytology from the fluid showed high-grade serous carcinoma. Tumor cells were positive for CK7, PAX 8, p53 and WT 1.  Patient started neoadjuvant carbotaxol and Avastin chemotherapy on 08/21/2018 and completed 4 cycles on 11/02/2018. Patient did not receive Avastin for cycle 4.  Pathology: DIAGNOSIS:  A. UTERUS AND CERVIX WITH BILATERAL FALLOPIAN TUBES AND OVARIES;  HYSTERECTOMY WITH BILATERAL SALPINGO-OOPHORECTOMY:  - HIGH-GRADE SEROUS ADENOCARCINOMA.  - REFER TO CANCER SUMMARY BELOW.   CANCER CASE SUMMARY: OVARY or FALLOPIAN TUBE or PRIMARY PERITONEUM  Procedure: Total hysterectomy with bilateral salpingo-oophorectomy  Specimen Integrity: Intact  Tumor Site: Bilateral ovaries and  fallopian tubes  Ovarian Surface Involvement: Present, bilateral  Fallopian Tube Surface Involvement: Present, left  Tumor Size: Greatest dimension 1.2 cm  Histologic Type: Serous carcinoma  Histologic Grade: High grade  Implants: N/A  Other Tissue/ Organ Involvement: Appendix, parametrium  Largest Extrapelvic Peritoneal Focus: Microscopic  Peritoneal/Ascitic Fluid: Positive for malignancy  Treatment Effect: Moderate response; see comment.  Regional Lymph Nodes: No nodes submitted or found  Pathologic Stage Classification (pTNM, AJCC 8th Edition): pT3a pNX pM1b  FIGO: IVB  TNM Descriptors: N/A   Comment:  Chemotherapy Response Score (CRS) is assigned based on evaluation of tumor response in the omentum, which is not submitted in this specimen. In the submitted tissue, there is a partial/moderate tumor response.   B. APPENDIX, APPENDECTOMY:  - TRANSMURAL INVOLVEMENT BY HIGH-GRADE SEROUS ADENOCARCINOMA.   Interval history-she is working with physical therapy for her right foot drop which is gradually improving.  Reports no recent falls.  Denies any tingling numbness in her fingers.  ECOG PS- 1 Pain scale- 0 Opioid associated constipation- no  Review of systems- Review of Systems  Constitutional: Negative for chills, fever, malaise/fatigue and weight loss.  HENT: Negative for congestion, ear discharge and nosebleeds.   Eyes: Negative for blurred vision.  Respiratory: Negative for cough, hemoptysis, sputum production, shortness of breath and wheezing.   Cardiovascular: Negative for chest pain, palpitations, orthopnea and claudication.  Gastrointestinal: Negative for abdominal pain, blood in stool, constipation, diarrhea, heartburn, melena, nausea and vomiting.  Genitourinary: Negative for dysuria, flank pain, frequency, hematuria and urgency.  Musculoskeletal: Negative for back pain, joint pain and myalgias.  Skin: Negative for rash.  Neurological: Positive for sensory change  (Peripheral neuropathy). Negative for dizziness, tingling, focal weakness, seizures, weakness and headaches.  Endo/Heme/Allergies: Does not bruise/bleed easily.  Psychiatric/Behavioral: Negative for depression and suicidal ideas. The  patient does not have insomnia.        Allergies  Allergen Reactions  . Clarithromycin Nausea Only  . Doxycycline Nausea Only    dizziness  . Penicillins Hives    Did it involve swelling of the face/tongue/throat, SOB, or low BP? No Did it involve sudden or severe rash/hives, skin peeling, or any reaction on the inside of your mouth or nose? No Did you need to seek medical attention at a hospital or doctor's office? No When did it last happen?50+ years ago If all above answers are "NO", may proceed with cephalosporin use.      Past Medical History:  Diagnosis Date  . Allergy   . Anemia   . Complication of anesthesia   . Diabetes mellitus without complication ([place])   . Diverticulosis   . Family history of adverse reaction to anesthesia    mom-n/v  . Family history of lung cancer   . GERD (gastroesophageal reflux disease)    h/o  . Hyperlipidemia   . Hypothyroidism   . Ovarian cancer ([place]) 08/04/2018   Chemo tx's.  . Personal history of chemotherapy   . PONV (postoperative nausea and vomiting)    sick with tonsillectomy  . Thyroid disease      Past Surgical History:  Procedure Laterality Date  . ABDOMINAL HYSTERECTOMY    . BACK SURGERY  2007   Lumbar Surgery  . BREAST CYST ASPIRATION Right    Around 10 years ago. Pt thinks it was the right breast but not sure.  Marland Kitchen CATARACT EXTRACTION Left 2010   Right Eye 2011  . COLONOSCOPY WITH PROPOFOL N/A 09/02/2015   Procedure: COLONOSCOPY WITH PROPOFOL;  Surgeon: [name], MD;  Location: [place];  Service: Endoscopy;  Laterality: N/A;  Diabetic - oral meds  . ESOPHAGOGASTRODUODENOSCOPY (EGD) WITH PROPOFOL N/A 11/03/2015   Procedure: ESOPHAGOGASTRODUODENOSCOPY (EGD) WITH  PROPOFOL;  Surgeon: [name], MD;  Location: [place];  Service: Endoscopy;  Laterality: N/A;  . EYE SURGERY Bilateral 06/2014    tear duck catherization ( Dr. Vallarie Mare)  . LAPAROSCOPIC APPENDECTOMY  11/26/2018   Procedure: APPENDECTOMY LAPAROSCOPIC;  Surgeon: Ward, Honor Loh, MD;  Location: [facility];  Service: Gynecology;;  . POLYPECTOMY  09/02/2015   Procedure: POLYPECTOMY;  Surgeon: [name], MD;  Location: [place];  Service: Endoscopy;;  . TONSILLECTOMY  1963    Social History   Socioeconomic History  . Marital status: Single    Spouse name: Not on file  . Number of children: Not on file  . Years of education: Not on file  . Highest education level: Not on file  Occupational History  . Not on file  Tobacco Use  . Smoking status: Never Smoker  . Smokeless tobacco: Never Used  Substance and Sexual Activity  . Alcohol use: No  . Drug use: No  . Sexual activity: Not on file  Other Topics Concern  . Not on file  Social History Narrative  . Not on file   Social Determinants of Health   Financial Resource Strain:   . Difficulty of Paying Living Expenses: Not on file  Food Insecurity:   . Worried About Charity fundraiser in the Last Year: Not on file  . Ran Out of Food in the Last Year: Not on file  Transportation Needs:   . Lack of Transportation (Medical): Not on file  . Lack of Transportation (Non-Medical): Not on file  Physical Activity:   . Days of Exercise  per Week: Not on file  . Minutes of Exercise per Session: Not on file  Stress:   . Feeling of Stress : Not on file  Social Connections:   . Frequency of Communication with Friends and Family: Not on file  . Frequency of Social Gatherings with Friends and Family: Not on file  . Attends Religious Services: Not on file  . Active Member of Clubs or Organizations: Not on file  . Attends Archivist Meetings: Not on file  . Marital Status: Not on file  Intimate Partner  Violence:   . Fear of Current or Ex-Partner: Not on file  . Emotionally Abused: Not on file  . Physically Abused: Not on file  . Sexually Abused: Not on file    Family History  Problem Relation Age of Onset  . Congestive Heart Failure Mother   . Diabetes Mother   . Stroke Mother   . Hypothyroidism Father   . Cancer Father        lung cancer  . Cancer Paternal Grandmother        stomach  . Breast cancer Neg Hx      Current Outpatient Medications:  .  acetaminophen (TYLENOL) 500 MG tablet, Take 2 tablets (1,000 mg total) by mouth every 6 (six) hours., Disp: , Rfl:  .  calcipotriene (DOVONOX) 0.005 % ointment, Apply 1 application topically as needed. CALCIPOTRIENE, 0.005% (External Ointment) - Historical Medication  apply to skin daily (0.005 %) Active, Disp: , Rfl:  .  dexamethasone (DECADRON) 4 MG tablet, Take 2 tablets (8 mg total) by mouth daily. Start the day after carboplatin chemotherapy for 3 days., Disp: 30 tablet, Rfl: 1 .  fexofenadine (ALLEGRA) 60 MG tablet, Take 60 mg by mouth daily as needed for allergies. , Disp: , Rfl:  .  furosemide (LASIX) 20 MG tablet, Take 1 tablet (20 mg total) by mouth daily., Disp: 20 tablet, Rfl: 0 .  gabapentin (NEURONTIN) 300 MG capsule, Take 1 capsule (300 mg total) by mouth at bedtime. Take 2 tablets at bedtime once and then 3 tablets for three nights at bedtime., Disp: 11 capsule, Rfl: 0 .  glipiZIDE (GLUCOTROL) 5 MG tablet, TAKE 1 TABLET BY MOUTH DAILY BEFORE BREAKFAST (Patient taking differently: Take 5 mg by mouth daily before breakfast. ), Disp: 90 tablet, Rfl: 1 .  glucose blood (ONE TOUCH ULTRA TEST) test strip, USE TO TEST BLOOD SUGAR TWICE A DAY AS INSTRUCTED, Disp: 100 each, Rfl: 12 .  ibuprofen (ADVIL) 600 MG tablet, Take 1 tablet (600 mg total) by mouth every 6 (six) hours., Disp: 45 tablet, Rfl: 1 .  levothyroxine (SYNTHROID) 100 MCG tablet, Take 1 tablet (100 mcg total) by mouth daily., Disp: 90 tablet, Rfl: 1 .  metFORMIN  (GLUCOPHAGE) 500 MG tablet, Take 2 tablets (1,000 mg total) by mouth 2 (two) times daily., Disp: 360 tablet, Rfl: 1 .  MULTIPLE VITAMIN PO, Take 1 tablet by mouth daily. , Disp: , Rfl:  .  ONETOUCH DELICA LANCETS 92T MISC, USE TO TEST BLOOD SUGAR ONCE A DAY, Disp: 100 each, Rfl: 12 .  simvastatin (ZOCOR) 20 MG tablet, Take 1 tablet (20 mg total) by mouth at bedtime., Disp: 90 tablet, Rfl: 1 .  spironolactone (ALDACTONE) 50 MG tablet, Take 1 tablet (50 mg total) by mouth daily., Disp: 20 tablet, Rfl: 0 .  aspirin 81 MG tablet, Take 81 mg by mouth daily. , Disp: , Rfl:  .  LORazepam (ATIVAN) 0.5 MG tablet, Take 1  tablet (0.5 mg total) by mouth every 6 (six) hours as needed (Nausea or vomiting). (Patient not taking: Reported on 01/30/2019), Disp: 30 tablet, Rfl: 0 .  ondansetron (ZOFRAN) 8 MG tablet, Take 1 tablet (8 mg total) by mouth 2 (two) times daily as needed for refractory nausea / vomiting. Start on day 3 after carboplatin chemo. (Patient not taking: Reported on 01/30/2019), Disp: 30 tablet, Rfl: 1 .  oxyCODONE (ROXICODONE) 5 MG immediate release tablet, Take 1 tablet (5 mg total) by mouth every 8 (eight) hours as needed. (Patient not taking: Reported on 01/30/2019), Disp: 16 tablet, Rfl: 0 .  prochlorperazine (COMPAZINE) 10 MG tablet, Take 1 tablet (10 mg total) by mouth every 6 (six) hours as needed (Nausea or vomiting). (Patient not taking: Reported on 01/30/2019), Disp: 30 tablet, Rfl: 1 No current facility-administered medications for this visit.  Facility-Administered Medications Ordered in Other Visits:  .  sodium chloride flush (NS) 0.9 % injection 10 mL, 10 mL, Intravenous, Once, Randa Evens C, MD .  sodium chloride flush (NS) 0.9 % injection 10 mL, 10 mL, Intravenous, Once, Sindy Guadeloupe, MD  Physical exam:  Vitals:   02/23/19 0855 02/23/19 0856  BP:  138/82  Pulse:  78  Temp:  98.9 F (37.2 C)  TempSrc:  Tympanic  Weight: 107 lb (48.5 kg) 107 lb (48.5 kg)   Physical Exam  HENT:     Head: Normocephalic and atraumatic.  Eyes:     Pupils: Pupils are equal, round, and reactive to light.  Cardiovascular:     Rate and Rhythm: Normal rate and regular rhythm.     Heart sounds: Normal heart sounds.  Pulmonary:     Effort: Pulmonary effort is normal.     Breath sounds: Normal breath sounds.  Abdominal:     General: Bowel sounds are normal.     Palpations: Abdomen is soft.  Musculoskeletal:     Cervical back: Normal range of motion.  Skin:    General: Skin is warm and dry.  Neurological:     Mental Status: She is alert and oriented to person, place, and time.     Comments: Mild weakness noted on right foot dorsiflexion      CMP Latest Ref Rng & Units 02/23/2019  Glucose 70 - 99 mg/dL 154(H)  BUN 8 - 23 mg/dL 17  Creatinine 0.44 - 1.00 mg/dL 0.53  Sodium 135 - 145 mmol/L 137  Potassium 3.5 - 5.1 mmol/L 4.3  Chloride 98 - 111 mmol/L 97(L)  CO2 22 - 32 mmol/L 27  Calcium 8.9 - 10.3 mg/dL 9.7  Total Protein 6.5 - 8.1 g/dL 7.3  Total Bilirubin 0.3 - 1.2 mg/dL 0.3  Alkaline Phos 38 - 126 U/L 50  AST 15 - 41 U/L 25  ALT 0 - 44 U/L 18   CBC Latest Ref Rng & Units 02/23/2019  WBC 4.0 - 10.5 K/uL 5.1  Hemoglobin 12.0 - 15.0 g/dL 10.2(L)  Hematocrit 36.0 - 46.0 % 32.3(L)  Platelets 150 - 400 K/uL 228      Assessment and plan- Patient is a 64 y.o. 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 Avastin was held for cycle 4. She is s/p robotic assisted total laparoscopic hysterectomy and bilateral salpingo-oophorectomy and appendectomy with extensive lysis of adhesions and abdomen debulking.   She has completed 6 cycles of carbotaxol chemotherapy ( 4 neoadjuvantly and 2 adjuvantly).  She is here for  on treatment assessment prior to cycle 2 of maintenance Mvasi chemotherapy  Counts okay to proceed with cycle 2 of maintenance Mvasi chemotherapy today.   She will continue to receive this every 3 weeks until month 15 which would be roughly October 2021.  Her blood pressure is stable and urine protein remains trace.  Anemia: Suspect secondary to chemotherapy.  Continue to monitor.  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  Patient did develop a right foot drop for which she is working with physical therapy and is gradually improving.  She is also on gabapentin and she will continue taking that 3 times a day  I will see her back in 3 weeks time with CBC with differential, CMP and CA-125 for cycle 3 of adjuvant Mvasi chemotherapy   Visit Diagnosis 1. Serous adenocarcinoma ([place])   2. Bilateral primary ovarian cancer ([place])   3. Encounter for monoclonal antibody treatment for malignancy   4. Chemotherapy-induced peripheral neuropathy ([place])   5. Antineoplastic chemotherapy induced anemia      Dr. Randa Evens, MD, MPH [facility] at [facility] [phone] 02/24/2019 10:50 AM

## 2019-02-25 ENCOUNTER — Ambulatory Visit: Payer: Commercial Managed Care - PPO

## 2019-02-25 ENCOUNTER — Telehealth: Payer: Self-pay | Admitting: *Deleted

## 2019-02-25 NOTE — Telephone Encounter (Signed)
OT rec: that pt needs AFO for foot drop. They suggested getting it at [employer]. Order and notes faxed to the company above

## 2019-02-25 NOTE — Therapy (Signed)
[place] [facility] [address], [place], [address] Phone: [phone]   Fax:  [phone]  Physical Therapy Treatment  Patient Details  Name: Meredith Jones MRN: [id number] Date of Birth: 1955-09-17 Referring Provider (PT): Janese Banks MD   Encounter Date: 02/24/2019  PT End of Session - 02/24/19 1300    Visit Number  3    Number of Visits  13    Date for PT Re-Evaluation  03/31/19    PT Start Time  1300    PT Stop Time  1345    PT Time Calculation (min)  45 min    Equipment Utilized During Treatment  Gait belt    Activity Tolerance  Patient tolerated treatment well    Behavior During Therapy  [facility] for tasks assessed/performed       Past Medical History:  Diagnosis Date  . Allergy   . Anemia   . Complication of anesthesia   . Diabetes mellitus without complication ([place])   . Diverticulosis   . Family history of adverse reaction to anesthesia    mom-n/v  . Family history of lung cancer   . GERD (gastroesophageal reflux disease)    h/o  . Hyperlipidemia   . Hypothyroidism   . Ovarian cancer ([place]) 08/04/2018   Chemo tx's.  . Personal history of chemotherapy   . PONV (postoperative nausea and vomiting)    sick with tonsillectomy  . Thyroid disease     Past Surgical History:  Procedure Laterality Date  . ABDOMINAL HYSTERECTOMY    . BACK SURGERY  2007   Lumbar Surgery  . BREAST CYST ASPIRATION Right    Around 10 years ago. Pt thinks it was the right breast but not sure.  Marland Kitchen CATARACT EXTRACTION Left 2010   Right Eye 2011  . COLONOSCOPY WITH PROPOFOL N/A 09/02/2015   Procedure: COLONOSCOPY WITH PROPOFOL;  Surgeon: [name], MD;  Location: [place];  Service: Endoscopy;  Laterality: N/A;  Diabetic - oral meds  . ESOPHAGOGASTRODUODENOSCOPY (EGD) WITH PROPOFOL N/A 11/03/2015   Procedure: ESOPHAGOGASTRODUODENOSCOPY (EGD) WITH PROPOFOL;  Surgeon: [name], MD;  Location: [place];   Service: Endoscopy;  Laterality: N/A;  . EYE SURGERY Bilateral 06/2014    tear duck catherization ( Dr. Vallarie Mare)  . LAPAROSCOPIC APPENDECTOMY  11/26/2018   Procedure: APPENDECTOMY LAPAROSCOPIC;  Surgeon: Ward, Honor Loh, MD;  Location: [facility];  Service: Gynecology;;  . POLYPECTOMY  09/02/2015   Procedure: POLYPECTOMY;  Surgeon: [name], MD;  Location: [place];  Service: Endoscopy;;  . TONSILLECTOMY  1963    There were no vitals filed for this visit.  Subjective Assessment - 02/24/19 1300    Subjective  Patient reports no falls since the previous session. Patient reports she has been performing her HEP. Physician states that her walking looks better than last visit    Pertinent History  CA history, radiation/chemo treatments, neuropathy into the feet    Limitations  Walking    How long can you walk comfortably?  Increased falls    Diagnostic tests  not performed    Patient Stated Goals  To decrease walking quality       Treatment   Neuromuscular rehabilitation Russian x 10 min 10sec on/10 sec off, 2 second ramp +, 2 seconds - with patient positioned in sitting with the goal to improve tibialis anterior activation to decrease foot drop. Patient performs dorsiflexion during on section of duty cycle for better recruitment.  TX STS x 10, x 10 - emphasizes slow and controlled descent into chair SLS 3 x 30 sec, used three fingers for support on last set Stairs x 16 steps, used three fingers for support going up/down  Tandem Amb - 4 x 7ft w/ CGA  Standing Marches 2 x 20 w/ single hand support  P[place]of Press 2 x 20 sec with green band, arms at 90 degrees of elbow flexion   Therapeutic Exercise performed to improve LE strength and improve dynamic balance with activity.    PT Education - 02/24/19 1300    Education Details  from/technique with exercise    Person(s) Educated  Patient    Methods  Explanation;Demonstration    Comprehension  Verbalized  understanding;Returned demonstration       PT Short Term Goals - 02/24/19 1300      PT SHORT TERM GOAL #1   Title  Patient will be independent with HEP to continue benefits of therapy after discharge.    Baseline  Dependent with form/technique    Time  6    Period  Weeks    Status  New    Target Date  03/03/19        PT Long Term Goals - 02/24/19 1300      PT LONG TERM GOAL #1   Title  Patient will improve 5xSTS to under 12 sec to decrease fall risk and ability to stand from low surfaces    Baseline  5XSTS: 25sec    Time  6    Period  Weeks    Status  New      PT LONG TERM GOAL #2   Title  Patient will improve TUG to under 8 sec to indicate decrease fall risks most notably with transferring to amb.    Baseline  TUG: 12.5sec    Time  6    Period  Weeks    Status  New      PT LONG TERM GOAL #3   Title  Patient will improve ability to perform 67mWT to over 1 m/s to indicate increased safety with community amb.    Baseline  .8 m/s    Time  6    Period  Weeks    Status  New      PT LONG TERM GOAL #4   Title  Patient will improve FGA to 27 points to indicate improvement with functional and dynamic gait with exercise.    Baseline  20/30 FGA    Time  6    Period  Weeks    Status  New      PT LONG TERM GOAL #5   Title  Patient will improve her FOTO score to 77 to indicate significant improvement with her balance.    Baseline  55 FOTO    Time  6    Period  Weeks    Status  New            Plan - 02/24/19 1300    Clinical Impression Statement  Patient demonstrates positive trendelenberg B most notably with single leg stance and ascending/descending stairs. Patient has limited ankle strategies on the R LE utilizing hip strategies to maintain balance. Patient showed an ability to slightly contract tibialis anterior. Patient will benefit from further skilled therapy to return PLOF    Personal Factors and Comorbidities  Age;Comorbidity 3+;Past/Current Experience     Comorbidities  hx of CA, neuropathy, drop foot    Examination-Activity Limitations  Carry;Stairs;Stand;Bend  Examination-Participation Restrictions  Cleaning;Community Activity;Yard Work    [employer]  Evolving/Moderate complexity    Rehab Potential  Fair    PT Frequency  2x / week    PT Duration  6 weeks    PT Treatment/Interventions  Balance training;Neuromuscular re-education;Functional mobility training;Therapeutic activities;Gait training;Stair training;Electrical Stimulation;Manual techniques;Energy conservation;Patient/family education;Therapeutic exercise    PT Next Visit Plan  Improve hip strength, dyanmic balance, progress time with e-stim along tibialis ant    PT Home Exercise Plan  Sit to stands; SLS    Consulted and Agree with Plan of Care  Patient       Patient will benefit from skilled therapeutic intervention in order to improve the following deficits and impairments:  Decreased coordination, Decreased mobility, Decreased endurance, Decreased range of motion, Decreased activity tolerance, Decreased strength, Decreased balance, Difficulty walking, Pain, Abnormal gait  Visit Diagnosis: Foot drop, right  Difficulty in walking, not elsewhere classified     Problem List Patient Active Problem List   Diagnosis Date Noted  . Bilateral primary ovarian cancer ([place]) 02/02/2019  . Genetic testing 12/09/2018  . Status post hysterectomy 11/26/2018  . Need for prophylactic vaccination and inoculation against influenza 11/04/2018  . Anemia of chronic disease 10/22/2018  . B12 deficiency 10/22/2018  . Family history of lung cancer   . Serous adenocarcinoma ([place]) 08/13/2018  . Goals of care, counseling/discussion 08/11/2018  . Primary high grade serous adenocarcinoma of ovary ([place]) 08/11/2018  . Loss of weight   . Benign neoplasm of descending colon   . Type 2 diabetes mellitus without complication, without long-term current use of insulin ([place])  06/07/2015  . 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 Cyst of ovary 06/09/2014  . Bulky or enlarged uterus 06/09/2014  . Hypercholesteremia 02/09/2007  . Allergic rhinitis 11/27/2006  . Endometriosis 11/27/2006  . Adult hypothyroidism 11/27/2006    Gloriann Loan, SPT  02/25/2019, 10:02 AM  [place] [address], [place], [address] Phone: [phone]   Fax:  [phone]  Name: ABBRIELLE RENTAS MRN: [id number] Date of Birth: Jul 14, 1955

## 2019-02-26 ENCOUNTER — Inpatient Hospital Stay: Payer: Commercial Managed Care - PPO

## 2019-02-26 ENCOUNTER — Other Ambulatory Visit: Payer: Self-pay

## 2019-02-26 ENCOUNTER — Ambulatory Visit: Payer: Commercial Managed Care - PPO

## 2019-02-26 DIAGNOSIS — M21371 Foot drop, right foot: Secondary | ICD-10-CM | POA: Diagnosis not present

## 2019-02-26 DIAGNOSIS — R262 Difficulty in walking, not elsewhere classified: Secondary | ICD-10-CM

## 2019-02-26 NOTE — Therapy (Signed)
[facility] [address] [place], [address] Phone: [phone]   Fax:  [phone]  Physical Therapy Treatment  Patient Details  Name: Meredith Jones MRN: [id number] Date of Birth: 1955/12/23 Referring Provider (PT): Janese Banks MD   Encounter Date: 02/26/2019  PT End of Session - 02/26/19 1115    Visit Number  4    Number of Visits  13    Date for PT Re-Evaluation  03/31/19    PT Start Time  1115    PT Stop Time  1200    PT Time Calculation (min)  45 min    Equipment Utilized During Treatment  Gait belt    Activity Tolerance  Patient tolerated treatment well    Behavior During Therapy  [facility] for tasks assessed/performed       Past Medical History:  Diagnosis Date  . Allergy   . Anemia   . Complication of anesthesia   . Diabetes mellitus without complication ([place])   . Diverticulosis   . Family history of adverse reaction to anesthesia    mom-n/v  . Family history of lung cancer   . GERD (gastroesophageal reflux disease)    h/o  . Hyperlipidemia   . Hypothyroidism   . Ovarian cancer ([place]) 08/04/2018   Chemo tx's.  . Personal history of chemotherapy   . PONV (postoperative nausea and vomiting)    sick with tonsillectomy  . Thyroid disease     Past Surgical History:  Procedure Laterality Date  . ABDOMINAL HYSTERECTOMY    . BACK SURGERY  2007   Lumbar Surgery  . BREAST CYST ASPIRATION Right    Around 10 years ago. Pt thinks it was the right breast but not sure.  Marland Kitchen CATARACT EXTRACTION Left 2010   Right Eye 2011  . COLONOSCOPY WITH PROPOFOL N/A 09/02/2015   Procedure: COLONOSCOPY WITH PROPOFOL;  Surgeon: [name], MD;  Location: [place];  Service: Endoscopy;  Laterality: N/A;  Diabetic - oral meds  . ESOPHAGOGASTRODUODENOSCOPY (EGD) WITH PROPOFOL N/A 11/03/2015   Procedure: ESOPHAGOGASTRODUODENOSCOPY (EGD) WITH PROPOFOL;  Surgeon: [name], MD;  Location: [place];   Service: Endoscopy;  Laterality: N/A;  . EYE SURGERY Bilateral 06/2014    tear duck catherization ( Dr. Vallarie Mare)  . LAPAROSCOPIC APPENDECTOMY  11/26/2018   Procedure: APPENDECTOMY LAPAROSCOPIC;  Surgeon: Ward, Honor Loh, MD;  Location: [facility];  Service: Gynecology;;  . POLYPECTOMY  09/02/2015   Procedure: POLYPECTOMY;  Surgeon: [name], MD;  Location: [place];  Service: Endoscopy;;  . TONSILLECTOMY  1963    There were no vitals filed for this visit.  Subjective Assessment - 02/26/19 1302    Subjective  Patient reports no falls since the previous session. Patient reports she has been performing her HEP and she feels more confident walking    Pertinent History  CA history, radiation/chemo treatments, neuropathy into the feet    Limitations  Walking    Currently in Pain?  No/denies        TREATMENT  Neuromuscular Reeducation Russian - 12 minutes 10/10 at 55% burst  TA Sit to Stands w/ slight elevation 2 x 10 (strength)  SLS 3 x 30 seconds 1 finger (balance) Step ups on the stairs 2 x 12  Tandem Walking 4 x 66ft (balance) Standing Hip Abductions 2 x 12 each (strength) Ball Trunk Rotations with feet together 2 x 8 ea side (core and balance)  Therapeutic activities were performed to  strengthen the hips and increase static balance   PT Education - 02/26/19 1115    Education Details  form/technique with exercise    Person(s) Educated  Patient    Methods  Explanation;Demonstration;Tactile cues;Verbal cues    Comprehension  Returned demonstration;Verbalized understanding       PT Short Term Goals - 02/24/19 1300      PT SHORT TERM GOAL #1   Title  Patient will be independent with HEP to continue benefits of therapy after discharge.    Baseline  Dependent with form/technique    Time  6    Period  Weeks    Status  New    Target Date  03/03/19        PT Long Term Goals - 02/24/19 1300      PT LONG TERM GOAL #1   Title  Patient will improve 5xSTS to  under 12 sec to decrease fall risk and ability to stand from low surfaces    Baseline  5XSTS: 25sec    Time  6    Period  Weeks    Status  New      PT LONG TERM GOAL #2   Title  Patient will improve TUG to under 8 sec to indicate decrease fall risks most notably with transferring to amb.    Baseline  TUG: 12.5sec    Time  6    Period  Weeks    Status  New      PT LONG TERM GOAL #3   Title  Patient will improve ability to perform 63mWT to over 1 m/s to indicate increased safety with community amb.    Baseline  .8 m/s    Time  6    Period  Weeks    Status  New      PT LONG TERM GOAL #4   Title  Patient will improve FGA to 27 points to indicate improvement with functional and dynamic gait with exercise.    Baseline  20/30 FGA    Time  6    Period  Weeks    Status  New      PT LONG TERM GOAL #5   Title  Patient will improve her FOTO score to 77 to indicate significant improvement with her balance.    Baseline  55 FOTO    Time  6    Period  Weeks    Status  New            Plan - 02/26/19 1115    Clinical Impression Statement  Patient did well with therex today indicated by less cues required and ability to perform greater repetitions made in SLS and STS. Patient also showed improvement in tibialis anterior activation after neuromuscular rehab as noted by increased AROM with dorsiflexion on the affected side. However, patient shows decreased ankle balancing strategies with tandem walking due to lack of anterior tibialis motor control. Due to progressions and limitations noted previously, patient will benefit from further skilled therapy to return to PLOF.    Personal Factors and Comorbidities  Age;Comorbidity 3+;Past/Current Experience    Comorbidities  hx of CA, neuropathy, drop foot    Examination-Activity Limitations  Carry;Stairs;Stand;Bend    Examination-Participation Restrictions  Cleaning;Community Activity;Yard Work    [employer] officer   Evolving/Moderate complexity    Rehab Potential  Fair    PT Frequency  2x / week    PT Duration  6 weeks    PT Treatment/Interventions  Balance training;Neuromuscular re-education;Functional  mobility training;Therapeutic activities;Gait training;Stair training;Electrical Stimulation;Manual techniques;Energy conservation;Patient/family education;Therapeutic exercise    PT Next Visit Plan  Improve hip strength, dyanmic balance    PT Home Exercise Plan  Sit to stands; SLS; progress exercises for hip abductor strength    Consulted and Agree with Plan of Care  Patient       Patient will benefit from skilled therapeutic intervention in order to improve the following deficits and impairments:  Decreased coordination, Decreased mobility, Decreased endurance, Decreased range of motion, Decreased activity tolerance, Decreased strength, Decreased balance, Difficulty walking, Pain, Abnormal gait  Visit Diagnosis: Foot drop, right  Difficulty in walking, not elsewhere classified     Problem List Patient Active Problem List   Diagnosis Date Noted  . Bilateral primary ovarian cancer ([place]) 02/02/2019  . Genetic testing 12/09/2018  . Status post hysterectomy 11/26/2018  . Need for prophylactic vaccination and inoculation against influenza 11/04/2018  . Anemia of chronic disease 10/22/2018  . B12 deficiency 10/22/2018  . Family history of lung cancer   . 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 Goals of care, counseling/discussion 08/11/2018  . 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 Loss of weight   . Benign neoplasm of descending colon   . Type 2 diabetes mellitus without complication, without long-term current use of insulin ([place]) 06/07/2015  . 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 Cyst of ovary 06/09/2014  . Bulky or enlarged uterus 06/09/2014  . Hypercholesteremia 02/09/2007  . Allergic rhinitis 11/27/2006  . Endometriosis 11/27/2006  . Adult  hypothyroidism 11/27/2006    Gloriann Loan, SPT 02/26/2019, 2:18 PM  [facility] [address] [place], [address] Phone: [phone]   Fax:  [phone]  Name: Meredith Jones MRN: [id number] Date of Birth: 06-26-1955

## 2019-03-02 ENCOUNTER — Ambulatory Visit: Payer: Commercial Managed Care - PPO

## 2019-03-02 ENCOUNTER — Inpatient Hospital Stay: Payer: Commercial Managed Care - PPO

## 2019-03-02 ENCOUNTER — Other Ambulatory Visit: Payer: Self-pay

## 2019-03-02 DIAGNOSIS — M21371 Foot drop, right foot: Secondary | ICD-10-CM | POA: Diagnosis not present

## 2019-03-02 DIAGNOSIS — R262 Difficulty in walking, not elsewhere classified: Secondary | ICD-10-CM

## 2019-03-02 NOTE — Therapy (Signed)
[facility] [address] [place], [address] Phone: [phone]   Fax:  [phone]  Physical Therapy Treatment  Patient Details  Name: Meredith Jones MRN: [id number] Date of Birth: [birth year] Referring Provider (PT): Janese Banks MD   Encounter Date: 03/02/2019  PT End of Session - 03/02/19 1430    Visit Number  5    Number of Visits  13    Date for PT Re-Evaluation  03/31/19    Authorization Type  5/10    PT Start Time  1430    PT Stop Time  1520    PT Time Calculation (min)  50 min    Equipment Utilized During Treatment  Gait belt    Activity Tolerance  Patient tolerated treatment well    Behavior During Therapy  [facility] for tasks assessed/performed       Past Medical History:  Diagnosis Date  . Allergy   . Anemia   . Complication of anesthesia   . Diabetes mellitus without complication ([place])   . Diverticulosis   . Family history of adverse reaction to anesthesia    mom-n/v  . Family history of lung cancer   . GERD (gastroesophageal reflux disease)    h/o  . Hyperlipidemia   . Hypothyroidism   . Ovarian cancer ([place]) 08/04/2018   Chemo tx's.  . Personal history of chemotherapy   . PONV (postoperative nausea and vomiting)    sick with tonsillectomy  . Thyroid disease     Past Surgical History:  Procedure Laterality Date  . ABDOMINAL HYSTERECTOMY    . BACK SURGERY  2007   Lumbar Surgery  . BREAST CYST ASPIRATION Right    Around 10 years ago. Pt thinks it was the right breast but not sure.  Marland Kitchen CATARACT EXTRACTION Left 2010   Right Eye 2011  . COLONOSCOPY WITH PROPOFOL N/A 09/02/2015   Procedure: COLONOSCOPY WITH PROPOFOL;  Surgeon: [name], MD;  Location: [place];  Service: Endoscopy;  Laterality: N/A;  Diabetic - oral meds  . ESOPHAGOGASTRODUODENOSCOPY (EGD) WITH PROPOFOL N/A 11/03/2015   Procedure: ESOPHAGOGASTRODUODENOSCOPY (EGD) WITH PROPOFOL;  Surgeon: [name], MD;   Location: [place];  Service: Endoscopy;  Laterality: N/A;  . EYE SURGERY Bilateral 06/2014    tear duck catherization ( Dr. Vallarie Mare)  . LAPAROSCOPIC APPENDECTOMY  11/26/2018   Procedure: APPENDECTOMY LAPAROSCOPIC;  Surgeon: Ward, Honor Loh, MD;  Location: [facility];  Service: Gynecology;;  . POLYPECTOMY  09/02/2015   Procedure: POLYPECTOMY;  Surgeon: [name], MD;  Location: [place];  Service: Endoscopy;;  . TONSILLECTOMY  1963    There were no vitals filed for this visit.  Subjective Assessment - 03/02/19 1430    Subjective  Patient reports no falls since the previous session. Patient reports she has been performing her HEP and she feels more confident walking    Pertinent History  CA history, radiation/chemo treatments, neuropathy into the feet    Limitations  Walking    Currently in Pain?  No/denies    Pain Score  0-No pain       TREATMENT  Neuromuscular reeducation  Russian STIM - 10 minutes 10/10 Frequency: 60 Htz burst frequenct,ypatient was sitting and actively contracted anterior tibialis as intensity increased. PT assisted into dorsiflexion    TE Sit to Stands  x 10 (strength) 3/4 Squats w/ table as external cue 2 x 10  - Demo half squats and teach with external cues/internal cues  SLS 3 x 30 seconds 1 finger contact and no contact (balance)  Step ups 2 x 12 (strength) Standing Hip Abductions 2 x 10 each (strength) cuing for proper hip abduction/Extension  Therapeutic exercise conducted to help increase muscular strength to further her ability to perform functional activities well.    PT Education - 03/02/19 1622    Education Details  form/technique with exercise    Person(s) Educated  Patient    Methods  Explanation;Demonstration;Tactile cues;Verbal cues    Comprehension  Verbal cues required;Tactile cues required;Need further instruction       PT Short Term Goals - 02/24/19 1300      PT SHORT TERM GOAL #1   Title  Patient will be  independent with HEP to continue benefits of therapy after discharge.    Baseline  Dependent with form/technique    Time  6    Period  Weeks    Status  New    Target Date  03/03/19        PT Long Term Goals - 02/24/19 1300      PT LONG TERM GOAL #1   Title  Patient will improve 5xSTS to under 12 sec to decrease fall risk and ability to stand from low surfaces    Baseline  5XSTS: 25sec    Time  6    Period  Weeks    Status  New      PT LONG TERM GOAL #2   Title  Patient will improve TUG to under 8 sec to indicate decrease fall risks most notably with transferring to amb.    Baseline  TUG: 12.5sec    Time  6    Period  Weeks    Status  New      PT LONG TERM GOAL #3   Title  Patient will improve ability to perform 67mWT to over 1 m/s to indicate increased safety with community amb.    Baseline  .8 m/s    Time  6    Period  Weeks    Status  New      PT LONG TERM GOAL #4   Title  Patient will improve FGA to 27 points to indicate improvement with functional and dynamic gait with exercise.    Baseline  20/30 FGA    Time  6    Period  Weeks    Status  New      PT LONG TERM GOAL #5   Title  Patient will improve her FOTO score to 77 to indicate significant improvement with her balance.    Baseline  55 FOTO    Time  6    Period  Weeks    Status  New            Plan - 03/02/19 1430    Clinical Impression Statement  Patient did well with therex as she was able to demonstrate proper technique with new squatting exercise. However, patient required lots of verbal, tactile cuing for standing hip abduction and step ups. Will adapt    Personal Factors and Comorbidities  Age;Comorbidity 3+;Past/Current Experience    Comorbidities  hx of CA, neuropathy, drop foot    Examination-Activity Limitations  Carry;Stairs;Stand;Bend    Examination-Participation Restrictions  Cleaning;Community Activity;Yard Work    [employer] officer  Evolving/Moderate complexity     Rehab Potential  Fair    PT Frequency  2x / week    PT Duration  6 weeks    PT Treatment/Interventions  Balance  training;Neuromuscular re-education;Functional mobility training;Therapeutic activities;Gait training;Stair training;Electrical Stimulation;Manual techniques;Energy conservation;Patient/family education;Therapeutic exercise    PT Next Visit Plan  Improve hip strength, dyanmic balance    PT Home Exercise Plan  Sit to stands; SLS; progress exercises for hip abductor strength    Consulted and Agree with Plan of Care  Patient       Patient will benefit from skilled therapeutic intervention in order to improve the following deficits and impairments:  Decreased coordination, Decreased mobility, Decreased endurance, Decreased range of motion, Decreased activity tolerance, Decreased strength, Decreased balance, Difficulty walking, Pain, Abnormal gait  Visit Diagnosis: No diagnosis found.     Problem List Patient Active Problem List   Diagnosis Date Noted  . Bilateral primary ovarian cancer ([place]) 02/02/2019  . Genetic testing 12/09/2018  . Status post hysterectomy 11/26/2018  . Need for prophylactic vaccination and inoculation against influenza 11/04/2018  . Anemia of chronic disease 10/22/2018  . B12 deficiency 10/22/2018  . Family history of lung cancer   . 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 Goals of care, counseling/discussion 08/11/2018  . 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 Loss of weight   . Benign neoplasm of descending colon   . Type 2 diabetes mellitus without complication, without long-term current use of insulin ([place]) 06/07/2015  . 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 Cyst of ovary 06/09/2014  . Bulky or enlarged uterus 06/09/2014  . Hypercholesteremia 02/09/2007  . Allergic rhinitis 11/27/2006  . Endometriosis 11/27/2006  . Adult hypothyroidism 11/27/2006    Gloriann Loan, SPT 03/02/2019, 5:53 PM  [facility] [address], [place], [address] Phone: [phone]   Fax:  [phone]  Name: NAVEAH NEVEU MRN: [id number] Date of Birth: 13-Jun-1955

## 2019-03-03 ENCOUNTER — Inpatient Hospital Stay: Payer: Commercial Managed Care - PPO

## 2019-03-03 ENCOUNTER — Other Ambulatory Visit: Payer: Self-pay | Admitting: Physician Assistant

## 2019-03-03 DIAGNOSIS — E7800 Pure hypercholesterolemia, unspecified: Secondary | ICD-10-CM

## 2019-03-03 NOTE — Telephone Encounter (Signed)
Requested medication (s) are due for refill today: yes  Requested medication (s) are on the active medication list: yes  Last refill:  11/25/2018  Future visit scheduled: no  Notes to clinic:  no valid encounter in last 12 months    Requested Prescriptions  Pending Prescriptions Disp Refills   simvastatin (ZOCOR) 20 MG tablet [Pharmacy Med Name: SIMVASTATIN 20 MG TAB] 90 tablet 1    Sig: TAKE ONE TABLET BY MOUTH AT BEDTIME      Cardiovascular:  Antilipid - Statins Failed - 03/03/2019 10:45 AM      Failed - Total Cholesterol in normal range and within 360 days    Cholesterol, Total  Date Value Ref Range Status  05/31/2017 135 100 - 199 mg/dL Final          Failed - LDL in normal range and within 360 days    LDL Cholesterol (Calc)  Date Value Ref Range Status  11/13/2016 56 mg/dL (calc) Final    Comment:    Reference range: <100 . Desirable range <100 mg/dL for primary prevention;   <70 mg/dL for patients with CHD or diabetic patients  with > or = 2 CHD risk factors. Marland Kitchen LDL-C is now calculated using the Martin-Hopkins  calculation, which is a validated novel method providing  better accuracy than the Friedewald equation in the  estimation of LDL-C.  Cresenciano Genre et al. Annamaria Helling. [id number]): 2061-2068  (http://education.[employer].com/faq/FAQ164)    LDL Calculated  Date Value Ref Range Status  05/31/2017 66 0 - 99 mg/dL Final          Failed - HDL in normal range and within 360 days    HDL  Date Value Ref Range Status  05/31/2017 48 >39 mg/dL Final          Failed - Triglycerides in normal range and within 360 days    Triglycerides  Date Value Ref Range Status  05/31/2017 103 0 - 149 mg/dL Final          Failed - Valid encounter within last 12 months    Recent Outpatient Visits           1 year ago Annual physical exam   [facility] [place], [place] Sor[place], [place]   1 year ago [place],  [place] Sor[place], [place]   2 years ago Encounter for Papanicolaou smear of cervix   [facility] [place], [place] Sor[place], [place]   2 years ago Annual physical exam   [facility] [place] M, [place]   2 years ago Controlled type 2 diabetes mellitus without complication, without long-term current use of insulin [facility])   [facility], [place], [place] - Patient is not pregnant

## 2019-03-04 ENCOUNTER — Inpatient Hospital Stay: Payer: Commercial Managed Care - PPO

## 2019-03-04 ENCOUNTER — Ambulatory Visit: Payer: Commercial Managed Care - PPO

## 2019-03-04 ENCOUNTER — Other Ambulatory Visit: Payer: Self-pay

## 2019-03-04 DIAGNOSIS — M21371 Foot drop, right foot: Secondary | ICD-10-CM | POA: Diagnosis not present

## 2019-03-04 DIAGNOSIS — R262 Difficulty in walking, not elsewhere classified: Secondary | ICD-10-CM

## 2019-03-04 NOTE — Therapy (Signed)
[facility] [address], [place], [address] Phone: [phone]   Fax:  [phone]  Physical Therapy Treatment  Patient Details  Name: Meredith Jones MRN: [id number] Date of Birth: 03-01-1955 Referring Provider (PT): Janese Banks MD   Encounter Date: 03/04/2019  PT End of Session - 03/04/19 1127    Visit Number  6    Number of Visits  13    Date for PT Re-Evaluation  03/31/19    Authorization Type  6/10    PT Start Time  1030    PT Stop Time  1120    PT Time Calculation (min)  50 min    Equipment Utilized During Treatment  Gait belt    Activity Tolerance  Patient tolerated treatment well    Behavior During Therapy  [facility] for tasks assessed/performed       Past Medical History:  Diagnosis Date  . Allergy   . Anemia   . Complication of anesthesia   . Diabetes mellitus without complication ([place])   . Diverticulosis   . Family history of adverse reaction to anesthesia    mom-n/v  . Family history of lung cancer   . GERD (gastroesophageal reflux disease)    h/o  . Hyperlipidemia   . Hypothyroidism   . Ovarian cancer ([place]) 08/04/2018   Chemo tx's.  . Personal history of chemotherapy   . PONV (postoperative nausea and vomiting)    sick with tonsillectomy  . Thyroid disease     Past Surgical History:  Procedure Laterality Date  . ABDOMINAL HYSTERECTOMY    . BACK SURGERY  2007   Lumbar Surgery  . BREAST CYST ASPIRATION Right    Around 10 years ago. Pt thinks it was the right breast but not sure.  Marland Kitchen CATARACT EXTRACTION Left 2010   Right Eye 2011  . COLONOSCOPY WITH PROPOFOL N/A 09/02/2015   Procedure: COLONOSCOPY WITH PROPOFOL;  Surgeon: [name], MD;  Location: [facility];  Service: Endoscopy;  Laterality: N/A;  Diabetic - oral meds  . ESOPHAGOGASTRODUODENOSCOPY (EGD) WITH PROPOFOL N/A 11/03/2015   Procedure: ESOPHAGOGASTRODUODENOSCOPY (EGD) WITH PROPOFOL;  Surgeon: [name], MD;   Location: [facility];  Service: Endoscopy;  Laterality: N/A;  . EYE SURGERY Bilateral 06/2014    tear duck catherization ( Dr. Vallarie Mare)  . LAPAROSCOPIC APPENDECTOMY  11/26/2018   Procedure: APPENDECTOMY LAPAROSCOPIC;  Surgeon: Ward, Honor Loh, MD;  Location: [facility];  Service: Gynecology;;  . POLYPECTOMY  09/02/2015   Procedure: POLYPECTOMY;  Surgeon: [name], MD;  Location: [facility];  Service: Endoscopy;;  . TONSILLECTOMY  1963    There were no vitals filed for this visit.  Subjective Assessment - 03/04/19 1030    Subjective  Pt reports that she is feeling more confident walking around at home and tending to her cats. Pt is still attempting to obtain an AFO from orthotist    Pertinent History  CA history, radiation/chemo treatments, neuropathy into the feet    Limitations  Walking    Currently in Pain?  No/denies       TREATMENT  Neuromuscular Reeducation Russian Stim - 12 minutes with (2) 2x2 pads along the tibialis ant w/ pt in sitting and instructed to actively contract anterior tibialis with increase in intensity. PT assisted with push into dorsiflexion. Frequency: 90Hz  Duty Cycle: 10on/10off  TA Squats 3 x 10 w/ table at lowest height with cueing to push knees out SLS 3 x 30 seconds  each side no hand hold, PT CGA with hands in front of patient Tandem walking 5 x 64ft w/ head up CGA   TE Ball Rotational Toss 2 x 10 each side w/ blue med ball  Eccentric Step downs 2 x 10 with hands on pelvis as external cue to note pelvis level.   Neuromuscular reed, Therapeutic exercises and activities performed to increase patient's muscular strength and gait functionality      PT Education - 03/04/19 1126    Education Details  form/technique with exercise    Person(s) Educated  Patient    Methods  Explanation;Demonstration;Tactile cues;Verbal cues    Comprehension  Returned demonstration;Verbal cues required;Tactile cues required;Need further instruction        PT Short Term Goals - 02/24/19 1300      PT SHORT TERM GOAL #1   Title  Patient will be independent with HEP to continue benefits of therapy after discharge.    Baseline  Dependent with form/technique    Time  6    Period  Weeks    Status  New    Target Date  03/03/19        PT Long Term Goals - 02/24/19 1300      PT LONG TERM GOAL #1   Title  Patient will improve 5xSTS to under 12 sec to decrease fall risk and ability to stand from low surfaces    Baseline  5XSTS: 25sec    Time  6    Period  Weeks    Status  New      PT LONG TERM GOAL #2   Title  Patient will improve TUG to under 8 sec to indicate decrease fall risks most notably with transferring to amb.    Baseline  TUG: 12.5sec    Time  6    Period  Weeks    Status  New      PT LONG TERM GOAL #3   Title  Patient will improve ability to perform 68mWT to over 1 m/s to indicate increased safety with community amb.    Baseline  .8 m/s    Time  6    Period  Weeks    Status  New      PT LONG TERM GOAL #4   Title  Patient will improve FGA to 27 points to indicate improvement with functional and dynamic gait with exercise.    Baseline  20/30 FGA    Time  6    Period  Weeks    Status  New      PT LONG TERM GOAL #5   Title  Patient will improve her FOTO score to 77 to indicate significant improvement with her balance.    Baseline  55 FOTO    Time  6    Period  Weeks    Status  New              Patient will benefit from skilled therapeutic intervention in order to improve the following deficits and impairments:     Visit Diagnosis: Foot drop, right  Difficulty in walking, not elsewhere classified     Problem List Patient Active Problem List   Diagnosis Date Noted  . Bilateral primary ovarian cancer ([place]) 02/02/2019  . Genetic testing 12/09/2018  . Status post hysterectomy 11/26/2018  . Need for prophylactic vaccination and inoculation against influenza 11/04/2018  . Anemia of chronic  disease 10/22/2018  . B12 deficiency 10/22/2018  . Family history of lung cancer   .  Serous adenocarcinoma ([place]) 08/13/2018  . Goals of care, counseling/discussion 08/11/2018  . 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 Loss of weight   . Benign neoplasm of descending colon   . Type 2 diabetes mellitus without complication, without long-term current use of insulin ([place]) 06/07/2015  . 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 Cyst of ovary 06/09/2014  . Bulky or enlarged uterus 06/09/2014  . Hypercholesteremia 02/09/2007  . Allergic rhinitis 11/27/2006  . Endometriosis 11/27/2006  . Adult hypothyroidism 11/27/2006    Gloriann Loan, SPT  03/04/2019, 11:31 AM  [facility] [address] [place], [address] Phone: [phone]   Fax:  [phone]  Name: TIARNA CEBALLOS MRN: [id number] Date of Birth: 1955/09/11

## 2019-03-10 ENCOUNTER — Other Ambulatory Visit: Payer: Self-pay

## 2019-03-10 ENCOUNTER — Ambulatory Visit: Payer: Commercial Managed Care - PPO

## 2019-03-10 ENCOUNTER — Inpatient Hospital Stay: Payer: Commercial Managed Care - PPO

## 2019-03-10 DIAGNOSIS — M21371 Foot drop, right foot: Secondary | ICD-10-CM

## 2019-03-10 DIAGNOSIS — R262 Difficulty in walking, not elsewhere classified: Secondary | ICD-10-CM

## 2019-03-10 NOTE — Therapy (Signed)
[facility] [address] [place], [address] Phone: [phone]   Fax:  [phone]  Physical Therapy Treatment  Patient Details  Name: Meredith Jones MRN: [id number] Date of Birth: 1955/04/27 Referring Provider (PT): Janese Banks MD   Encounter Date: 03/10/2019  PT End of Session - 03/10/19 1300    Visit Number  7    Number of Visits  13    Date for PT Re-Evaluation  03/31/19    Authorization Type  7/10    PT Start Time  1300    PT Stop Time  1345    PT Time Calculation (min)  45 min    Equipment Utilized During Treatment  Gait belt    Activity Tolerance  Patient tolerated treatment well    Behavior During Therapy  [facility] for tasks assessed/performed       Past Medical History:  Diagnosis Date  . Allergy   . Anemia   . Complication of anesthesia   . Diabetes mellitus without complication ([place])   . Diverticulosis   . Family history of adverse reaction to anesthesia    mom-n/v  . Family history of lung cancer   . GERD (gastroesophageal reflux disease)    h/o  . Hyperlipidemia   . Hypothyroidism   . Ovarian cancer ([place]) 08/04/2018   Chemo tx's.  . Personal history of chemotherapy   . PONV (postoperative nausea and vomiting)    sick with tonsillectomy  . Thyroid disease     Past Surgical History:  Procedure Laterality Date  . ABDOMINAL HYSTERECTOMY    . BACK SURGERY  2007   Lumbar Surgery  . BREAST CYST ASPIRATION Right    Around 10 years ago. Pt thinks it was the right breast but not sure.  Marland Kitchen CATARACT EXTRACTION Left 2010   Right Eye 2011  . COLONOSCOPY WITH PROPOFOL N/A 09/02/2015   Procedure: COLONOSCOPY WITH PROPOFOL;  Surgeon: [name], MD;  Location: [facility];  Service: Endoscopy;  Laterality: N/A;  Diabetic - oral meds  . ESOPHAGOGASTRODUODENOSCOPY (EGD) WITH PROPOFOL N/A 11/03/2015   Procedure: ESOPHAGOGASTRODUODENOSCOPY (EGD) WITH PROPOFOL;  Surgeon: [name], MD;   Location: [facility];  Service: Endoscopy;  Laterality: N/A;  . EYE SURGERY Bilateral 06/2014    tear duck catherization ( Dr. Vallarie Mare)  . LAPAROSCOPIC APPENDECTOMY  11/26/2018   Procedure: APPENDECTOMY LAPAROSCOPIC;  Surgeon: Ward, Honor Loh, MD;  Location: [facility];  Service: Gynecology;;  . POLYPECTOMY  09/02/2015   Procedure: POLYPECTOMY;  Surgeon: [name], MD;  Location: [facility];  Service: Endoscopy;;  . TONSILLECTOMY  1963    There were no vitals filed for this visit.  Subjective Assessment - 03/10/19 1300    Subjective  Pt had a good weekend tending to her cats. Pt staying busy around the home cleaning and doing laundry    Pertinent History  CA history, radiation/chemo treatments, neuropathy into the feet    Limitations  Walking    Currently in Pain?  No/denies    Pain Score  0-No pain      TREATMENT    Neuromuscular Reeducation Russian Stim - 12 minutes with (2) 2x2 pads along the tibialis ant w/ pt in sitting and instructed to actively contract anterior tibialis with increase in intensity. PT assisted with push into dorsiflexion Duration: 12 minutes  Freq: 20hz  Cycle time: 5/5 SLS no hand support w/ CGA 3 x 30 seconds ea Standing hip abductions w/ extensive UE  and LE cueing 3 x 15 ea Tandem Walking 6 x 10 ft   TE Squats 2 x 12  Lunges 2 x 12 ea w/ extensive cueing for anterior translation and moving  Stairs x 20    Neuromuscular reed and therex were conducted to increase strength and improve patients functioning with daily activities to return her to prior level of functioning.     PT Education - 03/10/19 1825    Education Details  form/technique with exercise    Person(s) Educated  Patient    Methods  Explanation;Demonstration;Tactile cues;Verbal cues    Comprehension  Verbalized understanding;Returned demonstration;Verbal cues required;Tactile cues required;Need further instruction       PT Short Term Goals - 02/24/19 1300       PT SHORT TERM GOAL #1   Title  Patient will be independent with HEP to continue benefits of therapy after discharge.    Baseline  Dependent with form/technique    Time  6    Period  Weeks    Status  New    Target Date  03/03/19        PT Long Term Goals - 02/24/19 1300      PT LONG TERM GOAL #1   Title  Patient will improve 5xSTS to under 12 sec to decrease fall risk and ability to stand from low surfaces    Baseline  5XSTS: 25sec    Time  6    Period  Weeks    Status  New      PT LONG TERM GOAL #2   Title  Patient will improve TUG to under 8 sec to indicate decrease fall risks most notably with transferring to amb.    Baseline  TUG: 12.5sec    Time  6    Period  Weeks    Status  New      PT LONG TERM GOAL #3   Title  Patient will improve ability to perform 44mWT to over 1 m/s to indicate increased safety with community amb.    Baseline  .8 m/s    Time  6    Period  Weeks    Status  New      PT LONG TERM GOAL #4   Title  Patient will improve FGA to 27 points to indicate improvement with functional and dynamic gait with exercise.    Baseline  20/30 FGA    Time  6    Period  Weeks    Status  New      PT LONG TERM GOAL #5   Title  Patient will improve her FOTO score to 77 to indicate significant improvement with her balance.    Baseline  55 FOTO    Time  6    Period  Weeks    Status  New            Plan - 03/10/19 1300    Clinical Impression Statement  Pt showed improvement with therex as evidenced by advancement in exercises with lunges. However, patinet needed extensive cueing for BLE engagement and increased anterior translation with lunge. Patient shows decreased SL strength as evidenced by inability to maintain SLS for more than 3 seconds. Patient will benefit from further skilled therapy to return her to PLOF    Personal Factors and Comorbidities  Age;Comorbidity 3+;Past/Current Experience;Other    Comorbidities  hx of CA, neuropathy, drop foot     Examination-Activity Limitations  Carry;Stairs;Stand;Bend    Examination-Participation Restrictions  Cleaning;Community Activity;Valla Leaver Work  Stability/Clinical Decision Making  Evolving/Moderate complexity    Rehab Potential  Fair    PT Frequency  2x / week    PT Duration  6 weeks    PT Treatment/Interventions  Balance training;Neuromuscular re-education;Functional mobility training;Therapeutic activities;Gait training;Stair training;Electrical Stimulation;Manual techniques;Energy conservation;Patient/family education;Therapeutic exercise    PT Next Visit Plan  Improve hip strength, dyanmic balance, Squats    PT Home Exercise Plan  Sit to stands; SLS; progress exercises for hip abductor strength    Consulted and Agree with Plan of Care  Patient       Patient will benefit from skilled therapeutic intervention in order to improve the following deficits and impairments:  Decreased coordination, Decreased mobility, Decreased endurance, Decreased range of motion, Decreased activity tolerance, Decreased strength, Decreased balance, Difficulty walking, Pain, Abnormal gait  Visit Diagnosis: No diagnosis found.     Problem List Patient Active Problem List   Diagnosis Date Noted  . Bilateral primary ovarian cancer ([place]) 02/02/2019  . Genetic testing 12/09/2018  . Status post hysterectomy 11/26/2018  . Need for prophylactic vaccination and inoculation against influenza 11/04/2018  . Anemia of chronic disease 10/22/2018  . B12 deficiency 10/22/2018  . Family history of lung cancer   . Serous adenocarcinoma ([place]) 08/13/2018  . Goals of care, counseling/discussion 08/11/2018  . 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 Loss of weight   . Benign neoplasm of descending colon   . Type 2 diabetes mellitus without complication, without long-term current use of insulin ([place]) 06/07/2015  . 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 Cyst of ovary 06/09/2014  . Bulky or  enlarged uterus 06/09/2014  . Hypercholesteremia 02/09/2007  . Allergic rhinitis 11/27/2006  . Endometriosis 11/27/2006  . Adult hypothyroidism 11/27/2006    Gloriann Loan, SPT 03/10/2019, 6:31 PM  [facility] [address] [place], [address] Phone: [phone]   Fax:  [phone]  Name: LASHAY MCROY MRN: [id number] Date of Birth: 08/18/1955

## 2019-03-11 ENCOUNTER — Inpatient Hospital Stay (HOSPITAL_BASED_OUTPATIENT_CLINIC_OR_DEPARTMENT_OTHER): Payer: Commercial Managed Care - PPO | Admitting: Hospice and Palliative Medicine

## 2019-03-11 DIAGNOSIS — Z515 Encounter for palliative care: Secondary | ICD-10-CM

## 2019-03-11 DIAGNOSIS — C801 Malignant (primary) neoplasm, unspecified: Secondary | ICD-10-CM

## 2019-03-11 NOTE — Progress Notes (Signed)
Virtual Visit via Telephone Note  I connected with Meredith Jones on 03/11/19 at 10:30 AM EST by telephone and verified that I am speaking with the correct person using two identifiers.   I discussed the limitations, risks, security and privacy concerns of performing an evaluation and management service by telephone and the availability of in person appointments. I also discussed with the patient that there may be a patient responsible charge related to this service. The patient expressed understanding and agreed to proceed.   History of Present Illness: Palliative Care consult requested for this 64 y.o. 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  Abdominal CT revealed cystic lesions in the bilateral ovaries with numerous bulky retroperitoneal lymph nodes and a large amount of ascites.  She has required therapeutic paracentesis.  Patient is s/p recent TAH and BSO on 11/26/2018. She is now receiving adjuvant systemic chemotherapy.  Patient was referred to palliative care to help address goals and manage ongoing symptoms.   Observations/Objective: I called and spoke with patient by phone.  Patient says she is doing better.  She continues to work with PT and feels like she is having slow improvement with her performance status.  She denies any distressing symptoms.  She says her appetite is good.  No significant changes or concerns were reported.  Assessment and Plan: Stage III serous adenocarcinoma the ovary -plan is for continued systemic chemotherapy.  Patient is being followed by Dr. Janese Banks.    Goals of care -are aligned with continued treatment.  Patient has opted to remain a full code  Follow Up Instructions: Follow-up telephone visit in 4 weeks   I discussed the assessment and treatment plan with the patient. The patient was provided an opportunity to ask questions and all were answered. The patient agreed with the plan and  demonstrated an understanding of the instructions.   The patient was advised to call back or seek an in-person evaluation if the symptoms worsen or if the condition fails to improve as anticipated.  I provided 5 minutes of non-face-to-face time during this encounter.   Irean Hong, NP

## 2019-03-12 ENCOUNTER — Other Ambulatory Visit: Payer: Self-pay

## 2019-03-12 ENCOUNTER — Ambulatory Visit: Payer: Commercial Managed Care - PPO

## 2019-03-12 ENCOUNTER — Telehealth: Payer: Self-pay

## 2019-03-12 DIAGNOSIS — R262 Difficulty in walking, not elsewhere classified: Secondary | ICD-10-CM

## 2019-03-12 DIAGNOSIS — M21371 Foot drop, right foot: Secondary | ICD-10-CM

## 2019-03-12 NOTE — Telephone Encounter (Signed)
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  Patient receiving adjuvant chemotherapy.    Spoke with patient via phone for nutrition follow-up.  Patient reports that her appetite is good, "too good".  Reports that she usually has a grilled cheese for breakfast and applesauce. Lunch is soup, mostly vegetables (little protein) and supper is salad or vegetables.  Has snack as well (nuts, peanut butter).      Medications: reviewed  Labs: glucose 154  Anthropometrics:   Weight increased to 107 lb from 102 lb on 11/2.     NUTRITION DIAGNOSIS:  Inadequate oral intake improving    INTERVENTION:  Congratulated patient on weight increase.  Encouraged her to continue to include good sources of protein at meal time in addition to eat vegetables.  Discussed protein food options. Patient concerned about eating and elevated blood glucose.     MONITORING, EVALUATION, GOAL: Patient will consume adequate calories and protein to prevent weight loss   NEXT VISIT: March 4th phone f/u  Meredith Jones, [place], [place] Registered Dietitian [phone] (pager)

## 2019-03-12 NOTE — Therapy (Signed)
[facility] [address], [place], [address] Phone: [phone]   Fax:  [phone]  Physical Therapy Treatment  Patient Details  Name: Meredith Jones MRN: [id number] Date of Birth: Jul 11, 1955 Referring Provider (PT): Janese Banks MD   Encounter Date: 03/12/2019  PT End of Session - 03/12/19 1616    Visit Number  8    Number of Visits  13    Date for PT Re-Evaluation  03/31/19    Authorization Type  8/10    PT Start Time  [id number]    PT Stop Time  1430    PT Time Calculation (min)  45 min    Equipment Utilized During Treatment  Gait belt    Activity Tolerance  Patient tolerated treatment well    Behavior During Therapy  [facility] for tasks assessed/performed       Past Medical History:  Diagnosis Date  . Allergy   . Anemia   . Complication of anesthesia   . Diabetes mellitus without complication ([place])   . Diverticulosis   . Family history of adverse reaction to anesthesia    mom-n/v  . Family history of lung cancer   . GERD (gastroesophageal reflux disease)    h/o  . Hyperlipidemia   . Hypothyroidism   . Ovarian cancer ([place]) 08/04/2018   Chemo tx's.  . Personal history of chemotherapy   . PONV (postoperative nausea and vomiting)    sick with tonsillectomy  . Thyroid disease     Past Surgical History:  Procedure Laterality Date  . ABDOMINAL HYSTERECTOMY    . BACK SURGERY  2007   Lumbar Surgery  . BREAST CYST ASPIRATION Right    Around 10 years ago. Pt thinks it was the right breast but not sure.  Marland Kitchen CATARACT EXTRACTION Left 2010   Right Eye 2011  . COLONOSCOPY WITH PROPOFOL N/A 09/02/2015   Procedure: COLONOSCOPY WITH PROPOFOL;  Surgeon: [name], MD;  Location: [facility];  Service: Endoscopy;  Laterality: N/A;  Diabetic - oral meds  . ESOPHAGOGASTRODUODENOSCOPY (EGD) WITH PROPOFOL N/A 11/03/2015   Procedure: ESOPHAGOGASTRODUODENOSCOPY (EGD) WITH PROPOFOL;  Surgeon: [name], MD;   Location: [facility];  Service: Endoscopy;  Laterality: N/A;  . EYE SURGERY Bilateral 06/2014    tear duck catherization ( Dr. Vallarie Mare)  . LAPAROSCOPIC APPENDECTOMY  11/26/2018   Procedure: APPENDECTOMY LAPAROSCOPIC;  Surgeon: Ward, Honor Loh, MD;  Location: [facility];  Service: Gynecology;;  . POLYPECTOMY  09/02/2015   Procedure: POLYPECTOMY;  Surgeon: [name], MD;  Location: [place] [facility];  Service: Endoscopy;;  . TONSILLECTOMY  1963    There were no vitals filed for this visit.  Subjective Assessment - 03/12/19 1614    Subjective  Patient states no major changes since last visit and no recent falls. Patient states that therapy seems to be helping her out and see likes the electrical stimulation    Pertinent History  CA history, radiation/chemo treatments, neuropathy into the feet    Limitations  Walking    Currently in Pain?  No/denies    Multiple Pain Sites  No        TREATMENT  Neuromuscular Reeducation Russian Stim - (2) 2x2 pads along the tibialis antw/ pt in sitting and instructed to actively contract anterior tibialis with increase in intensity. PT assisted with contraction into dorsiflexion Duration: 13 minutes  Freq: 20hz  Cycle time: 5/5 SLS 3 x 30 seconds. One finger on railing for left foot.  Tandem walking 6 x 10 ft    TE Squats 2 x 12 Clam Shells w/ contralateral leg stabilization w/ red band x 25 ea  Sidelying clam shells x 15 ea side. Cueing needed to keep feet together and not rotate into supine position    Neuromuscular reed and Therapeutic exercises conducted to help increase patient's functional level and strength postural muscles of the lower extremity    PT Education - 03/12/19 1616    Education Details  form/technique with exercise    Person(s) Educated  Patient    Methods  Explanation;Demonstration;Tactile cues;Verbal cues    Comprehension  Returned demonstration;Verbal cues required;Tactile cues required       PT Short  Term Goals - 02/24/19 1300      PT SHORT TERM GOAL #1   Title  Patient will be independent with HEP to continue benefits of therapy after discharge.    Baseline  Dependent with form/technique    Time  6    Period  Weeks    Status  New    Target Date  03/03/19        PT Long Term Goals - 02/24/19 1300      PT LONG TERM GOAL #1   Title  Patient will improve 5xSTS to under 12 sec to decrease fall risk and ability to stand from low surfaces    Baseline  5XSTS: 25sec    Time  6    Period  Weeks    Status  New      PT LONG TERM GOAL #2   Title  Patient will improve TUG to under 8 sec to indicate decrease fall risks most notably with transferring to amb.    Baseline  TUG: 12.5sec    Time  6    Period  Weeks    Status  New      PT LONG TERM GOAL #3   Title  Patient will improve ability to perform 53mWT to over 1 m/s to indicate increased safety with community amb.    Baseline  .8 m/s    Time  6    Period  Weeks    Status  New      PT LONG TERM GOAL #4   Title  Patient will improve FGA to 27 points to indicate improvement with functional and dynamic gait with exercise.    Baseline  20/30 FGA    Time  6    Period  Weeks    Status  New      PT LONG TERM GOAL #5   Title  Patient will improve her FOTO score to 77 to indicate significant improvement with her balance.    Baseline  55 FOTO    Time  6    Period  Weeks    Status  New            Plan - 03/12/19 1617    Clinical Impression Statement  Patient demonstration improvement in strength as evidenced by increased AROM of the anterior tibialis w/ DF and Inversion. Patient still requires therapist assistance to fully DF ankle to end range w/ russian stimulation. Patient is still unsteady on her feet with SLS and tandem walking. Despite improvements, patient will continue to require skilled therapy to return her to PLOF.    Personal Factors and Comorbidities  Age;Comorbidity 3+;Past/Current Experience;Other     Comorbidities  hx of CA, neuropathy, drop foot    Examination-Activity Limitations  Carry;Stairs;Stand;Bend    Examination-Participation Restrictions  Cleaning;Community Activity;Yard Work    [employer] officer  Evolving/Moderate complexity    Rehab Potential  Fair    PT Frequency  2x / week    PT Duration  6 weeks    PT Treatment/Interventions  Balance training;Neuromuscular re-education;Functional mobility training;Therapeutic activities;Gait training;Stair training;Electrical Stimulation;Manual techniques;Energy conservation;Patient/family education;Therapeutic exercise    PT Next Visit Plan  Hip strength, dyanmic balance, Squats, low functioning glute med exercises    PT Home Exercise Plan  Sit to stands; SLS; hip abductor strength    Consulted and Agree with Plan of Care  Patient       Patient will benefit from skilled therapeutic intervention in order to improve the following deficits and impairments:  Decreased coordination, Decreased mobility, Decreased endurance, Decreased range of motion, Decreased activity tolerance, Decreased strength, Decreased balance, Difficulty walking, Pain, Abnormal gait  Visit Diagnosis: Foot drop, right  Difficulty in walking, not elsewhere classified     Problem List Patient Active Problem List   Diagnosis Date Noted  . Bilateral primary ovarian cancer ([place]) 02/02/2019  . Genetic testing 12/09/2018  . Status post hysterectomy 11/26/2018  . Need for prophylactic vaccination and inoculation against influenza 11/04/2018  . Anemia of chronic disease 10/22/2018  . B12 deficiency 10/22/2018  . Family history of lung cancer   . 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 Goals of care, counseling/discussion 08/11/2018  . 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 Loss of weight   . Benign neoplasm of descending colon   . Type 2 diabetes mellitus without complication, without long-term current use of insulin  ([place]) 06/07/2015  . 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 Cyst of ovary 06/09/2014  . Bulky or enlarged uterus 06/09/2014  . Hypercholesteremia 02/09/2007  . Allergic rhinitis 11/27/2006  . Endometriosis 11/27/2006  . Adult hypothyroidism 11/27/2006    Gloriann Loan 03/12/2019, 4:22 PM  [facility] [address], [place], [address] Phone: [phone]   Fax:  [phone]  Name: Meredith Jones MRN: [id number] Date of Birth: 1955-02-28

## 2019-03-16 ENCOUNTER — Encounter: Payer: Self-pay | Admitting: Oncology

## 2019-03-16 ENCOUNTER — Inpatient Hospital Stay: Payer: Commercial Managed Care - PPO

## 2019-03-16 ENCOUNTER — Inpatient Hospital Stay: Payer: Commercial Managed Care - PPO | Attending: Oncology

## 2019-03-16 ENCOUNTER — Inpatient Hospital Stay (HOSPITAL_BASED_OUTPATIENT_CLINIC_OR_DEPARTMENT_OTHER): Payer: Commercial Managed Care - PPO | Admitting: Oncology

## 2019-03-16 ENCOUNTER — Ambulatory Visit: Payer: Commercial Managed Care - PPO | Attending: Oncology

## 2019-03-16 ENCOUNTER — Other Ambulatory Visit: Payer: Self-pay

## 2019-03-16 VITALS — BP 123/74 | HR 72 | Temp 96.8°F | Resp 18 | Wt 107.6 lb

## 2019-03-16 DIAGNOSIS — C801 Malignant (primary) neoplasm, unspecified: Secondary | ICD-10-CM

## 2019-03-16 DIAGNOSIS — Z5112 Encounter for antineoplastic immunotherapy: Secondary | ICD-10-CM

## 2019-03-16 DIAGNOSIS — R262 Difficulty in walking, not elsewhere classified: Secondary | ICD-10-CM

## 2019-03-16 DIAGNOSIS — T451X5A Adverse effect of antineoplastic and immunosuppressive drugs, initial encounter: Secondary | ICD-10-CM

## 2019-03-16 DIAGNOSIS — M21371 Foot drop, right foot: Secondary | ICD-10-CM | POA: Diagnosis not present

## 2019-03-16 DIAGNOSIS — C562 Malignant neoplasm of left ovary: Secondary | ICD-10-CM | POA: Diagnosis not present

## 2019-03-16 DIAGNOSIS — D696 Thrombocytopenia, unspecified: Secondary | ICD-10-CM | POA: Diagnosis not present

## 2019-03-16 DIAGNOSIS — E538 Deficiency of other specified B group vitamins: Secondary | ICD-10-CM | POA: Diagnosis not present

## 2019-03-16 DIAGNOSIS — C561 Malignant neoplasm of right ovary: Secondary | ICD-10-CM

## 2019-03-16 DIAGNOSIS — G620 Drug-induced polyneuropathy: Secondary | ICD-10-CM

## 2019-03-16 DIAGNOSIS — C563 Malignant neoplasm of bilateral ovaries: Secondary | ICD-10-CM

## 2019-03-16 DIAGNOSIS — E039 Hypothyroidism, unspecified: Secondary | ICD-10-CM | POA: Diagnosis not present

## 2019-03-16 LAB — COMPREHENSIVE METABOLIC PANEL
ALT: 25 U/L (ref 0–44)
AST: 27 U/L (ref 15–41)
Albumin: 3.5 g/dL (ref 3.5–5.0)
Alkaline Phosphatase: 44 U/L (ref 38–126)
Anion gap: 11 (ref 5–15)
BUN: 17 mg/dL (ref 8–23)
CO2: 26 mmol/L (ref 22–32)
Calcium: 9.6 mg/dL (ref 8.9–10.3)
Chloride: 93 mmol/L — ABNORMAL LOW (ref 98–111)
Creatinine, Ser: 0.53 mg/dL (ref 0.44–1.00)
GFR calc Af Amer: >60 mL/min (ref >60)
GFR calc non Af Amer: >60 mL/min (ref >60)
Glucose, Bld: 92 mg/dL (ref 70–99)
Potassium: 4.3 mmol/L (ref 3.5–5.1)
Sodium: 130 mmol/L — ABNORMAL LOW (ref 135–145)
Total Bilirubin: 0.5 mg/dL (ref 0.3–1.2)
Total Protein: 7 g/dL (ref 6.5–8.1)

## 2019-03-16 LAB — CBC WITH DIFFERENTIAL/PLATELET
Abs Immature Granulocytes: 0.01 10*3/uL (ref 0.00–0.07)
Basophils Absolute: 0.1 10*3/uL (ref 0.0–0.1)
Basophils Relative: 1 %
Eosinophils Absolute: 1.4 10*3/uL — ABNORMAL HIGH (ref 0.0–0.5)
Eosinophils Relative: 27 %
HCT: 33.9 % — ABNORMAL LOW (ref 36.0–46.0)
Hemoglobin: 11.1 g/dL — ABNORMAL LOW (ref 12.0–15.0)
Immature Granulocytes: 0 %
Lymphocytes Relative: 19 %
Lymphs Abs: 1 10*3/uL (ref 0.7–4.0)
MCH: 35.6 pg — ABNORMAL HIGH (ref 26.0–34.0)
MCHC: 32.7 g/dL (ref 30.0–36.0)
MCV: 108.7 fL — ABNORMAL HIGH (ref 80.0–100.0)
Monocytes Absolute: 0.4 10*3/uL (ref 0.1–1.0)
Monocytes Relative: 8 %
Neutro Abs: 2.3 10*3/uL (ref 1.7–7.7)
Neutrophils Relative %: 45 %
Platelets: 133 10*3/uL — ABNORMAL LOW (ref 150–400)
RBC: 3.12 MIL/uL — ABNORMAL LOW (ref 3.87–5.11)
RDW: 15 % (ref 11.5–15.5)
WBC: 5.2 10*3/uL (ref 4.0–10.5)
nRBC: 0 % (ref 0.0–0.2)

## 2019-03-16 LAB — PROTEIN, URINE, RANDOM: Total Protein, Urine: 6 mg/dL

## 2019-03-16 MED ORDER — SODIUM CHLORIDE 0.9 % IV SOLN
Freq: Once | INTRAVENOUS | Status: AC
  Filled 2019-03-16: qty 250

## 2019-03-16 MED ORDER — SODIUM CHLORIDE 0.9 % IV SOLN
15.0000 mg/kg | Freq: Once | INTRAVENOUS | Status: AC
  Administered 2019-03-16: 800 mg via INTRAVENOUS
  Filled 2019-03-16: qty 32

## 2019-03-16 NOTE — Therapy (Signed)
[facility] [address], [place], [address] Phone: [phone]   Fax:  [phone]  Physical Therapy Treatment  Patient Details  Name: Meredith Jones MRN: [id number] Date of Birth: 07-28-1955 Referring Provider (PT): Janese Banks MD   Encounter Date: 03/16/2019  PT End of Session - 03/16/19 1643    Visit Number  9    Number of Visits  13    Date for PT Re-Evaluation  03/31/19    Authorization Type  9/10    PT Start Time  1540    PT Stop Time  1630    PT Time Calculation (min)  50 min    Equipment Utilized During Treatment  Gait belt    Activity Tolerance  Patient tolerated treatment well    Behavior During Therapy  [facility] for tasks assessed/performed       Past Medical History:  Diagnosis Date  . Allergy   . Anemia   . Complication of anesthesia   . Diabetes mellitus without complication ([place])   . Diverticulosis   . Family history of adverse reaction to anesthesia    mom-n/v  . Family history of lung cancer   . GERD (gastroesophageal reflux disease)    h/o  . Hyperlipidemia   . Hypothyroidism   . Ovarian cancer ([place]) 08/04/2018   Chemo tx's.  . Personal history of chemotherapy   . PONV (postoperative nausea and vomiting)    sick with tonsillectomy  . 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 Thyroid disease     Past Surgical History:  Procedure Laterality Date  . ABDOMINAL HYSTERECTOMY    . BACK SURGERY  2007   Lumbar Surgery  . BREAST CYST ASPIRATION Right    Around 10 years ago. Pt thinks it was the right breast but not sure.  Marland Kitchen CATARACT EXTRACTION Left 2010   Right Eye 2011  . COLONOSCOPY WITH PROPOFOL N/A 09/02/2015   Procedure: COLONOSCOPY WITH PROPOFOL;  Surgeon: [name], MD;  Location: [facility];  Service: Endoscopy;  Laterality: N/A;  Diabetic - oral meds  . ESOPHAGOGASTRODUODENOSCOPY (EGD) WITH PROPOFOL N/A 11/03/2015   Procedure: ESOPHAGOGASTRODUODENOSCOPY (EGD) WITH  PROPOFOL;  Surgeon: [name], MD;  Location: [facility];  Service: Endoscopy;  Laterality: N/A;  . EYE SURGERY Bilateral 06/2014    tear duck catherization ( Dr. Vallarie Mare)  . LAPAROSCOPIC APPENDECTOMY  11/26/2018   Procedure: APPENDECTOMY LAPAROSCOPIC;  Surgeon: Ward, Honor Loh, MD;  Location: [facility];  Service: Gynecology;;  . POLYPECTOMY  09/02/2015   Procedure: POLYPECTOMY;  Surgeon: [name], MD;  Location: [facility];  Service: Endoscopy;;  . TONSILLECTOMY  1963    There were no vitals filed for this visit.  Subjective Assessment - 03/16/19 1640    Subjective  Patient states no major changes since last visit and no recent falls. Patient had just came in from chemo    Pertinent History  CA history, radiation/chemo treatments, neuropathy into the feet    Limitations  Walking    Currently in Pain?  No/denies    Multiple Pain Sites  No      TREATMENT   Neuromuscular Reeducation Russian Stim - (2) 2x2 pads along the tibialis antw/ pt in sitting and instructed to actively contract anterior tibialis with increase in intensity. PT assisted with contraction into dorsiflexion Duration: 13 minutes  Freq: 20hz  Cycle time: 5/5 Tandem walking 6 x 10 ft Standing Glute Squeezes 5 x 5 second  holds SLS Glute squeezes for glute med and max activation (patient was unable to contraction)   TE Squats 2 x 15 Stairs - x 20 steps alternating steps w/o support  Standing hip abduction x 10 ea Side ways walking w/ hip abduction 4 x 10 ft  SLS 3 x 30 seconds. One finger on railing for left foot.    Therapeutic exercises and Manual Therapy conducted to help improve patient's balance and lower extremity strength    PT Education - 03/16/19 1643    Education Details  form/technique with exercise    Person(s) Educated  Patient    Methods  Explanation;Demonstration;Tactile cues;Verbal cues    Comprehension  Verbalized understanding;Returned demonstration;Verbal cues  required;Tactile cues required       PT Short Term Goals - 02/24/19 1300      PT SHORT TERM GOAL #1   Title  Patient will be independent with HEP to continue benefits of therapy after discharge.    Baseline  Dependent with form/technique    Time  6    Period  Weeks    Status  New    Target Date  03/03/19        PT Long Term Goals - 02/24/19 1300      PT LONG TERM GOAL #1   Title  Patient will improve 5xSTS to under 12 sec to decrease fall risk and ability to stand from low surfaces    Baseline  5XSTS: 25sec    Time  6    Period  Weeks    Status  New      PT LONG TERM GOAL #2   Title  Patient will improve TUG to under 8 sec to indicate decrease fall risks most notably with transferring to amb.    Baseline  TUG: 12.5sec    Time  6    Period  Weeks    Status  New      PT LONG TERM GOAL #3   Title  Patient will improve ability to perform 43mWT to over 1 m/s to indicate increased safety with community amb.    Baseline  .8 m/s    Time  6    Period  Weeks    Status  New      PT LONG TERM GOAL #4   Title  Patient will improve FGA to 27 points to indicate improvement with functional and dynamic gait with exercise.    Baseline  20/30 FGA    Time  6    Period  Weeks    Status  New      PT LONG TERM GOAL #5   Title  Patient will improve her FOTO score to 77 to indicate significant improvement with her balance.    Baseline  55 FOTO    Time  6    Period  Weeks    Status  New            Plan - 03/16/19 1644    Clinical Impression Statement  Patient continues to show improvement in strength as evidenced by increasing AROM of anterior tibialis w/ DF and inversion. Patient showed advancemnt in SLS as she was able to maintain position w/o hand support for a few seconds. Patient also show decreased motor control over glute musculature w/ frontal and transverse plane exercises. Patient will require lower level functioning exercises to improve motor functioning. Patient  reuqires skilled therapy to return her to PLOF    Personal Factors and Comorbidities  Age;Comorbidity 3+;Past/Current  Experience;Other    Comorbidities  hx of CA, neuropathy, drop foot    Examination-Activity Limitations  Carry;Stairs;Stand;Bend    Examination-Participation Restrictions  Cleaning;Community Activity;Yard Work    [employer] officer  Evolving/Moderate complexity    Rehab Potential  Fair    PT Frequency  2x / week    PT Duration  6 weeks    PT Treatment/Interventions  Balance training;Neuromuscular re-education;Functional mobility training;Therapeutic activities;Gait training;Stair training;Electrical Stimulation;Manual techniques;Energy conservation;Patient/family education;Therapeutic exercise    PT Next Visit Plan  Hip strength, dyanmic balance, Squats, low functioning glute med exercises    PT Home Exercise Plan  Sit to stands; SLS; hip abductor strength    Consulted and Agree with Plan of Care  Patient       Patient will benefit from skilled therapeutic intervention in order to improve the following deficits and impairments:  Decreased coordination, Decreased mobility, Decreased endurance, Decreased range of motion, Decreased activity tolerance, Decreased strength, Decreased balance, Difficulty walking, Pain, Abnormal gait  Visit Diagnosis: Foot drop, right  Difficulty in walking, not elsewhere classified     Problem List Patient Active Problem List   Diagnosis Date Noted  . Bilateral primary ovarian cancer ([place]) 02/02/2019  . Genetic testing 12/09/2018  . Status post hysterectomy 11/26/2018  . Need for prophylactic vaccination and inoculation against influenza 11/04/2018  . Anemia of chronic disease 10/22/2018  . B12 deficiency 10/22/2018  . Family history of lung cancer   . Serous adenocarcinoma ([place]) 08/13/2018  . Goals of care, counseling/discussion 08/11/2018  . 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 Loss of  weight   . Benign neoplasm of descending colon   . Type 2 diabetes mellitus without complication, without long-term current use of insulin ([place]) 06/07/2015  . 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 Cyst of ovary 06/09/2014  . Bulky or enlarged uterus 06/09/2014  . Hypercholesteremia 02/09/2007  . Allergic rhinitis 11/27/2006  . Endometriosis 11/27/2006  . Adult hypothyroidism 11/27/2006    Gloriann Loan, SPT  03/16/2019, 4:49 PM Merdis Delay, PT, DPT Physical Therapist - [facility] [address] [place], [address] Phone: [phone]   Fax:  [phone]  Name: Meredith Jones MRN: [id number] Date of Birth: Aug 14, 1955

## 2019-03-16 NOTE — Progress Notes (Signed)
Pt needs packet of info for long term disability

## 2019-03-17 LAB — CA 125: Cancer Antigen (CA) 125: 11.8 U/mL (ref 0.0–38.1)

## 2019-03-17 NOTE — Progress Notes (Signed)
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 She presented with symptoms of bilateral lower extremity swelling as well as abdominal distention.GI. This was followed by a CT abdomen and chest imaging with contrast. CT showed cystic lesions of the enlarged bilateral ovaries the largest lesion on the left measuring 6.2 cm. Patient also noted to have numerous bulky retroperitoneal lymph nodes, largest left periaortic node or conglomerates near the superior mesenteric artery measuring at least 4.4 x 2.9 cm. Significant ascites was present. Patient underwent paracentesis and cytology from the fluid showed high-grade serous carcinoma. Tumor cells were positive for CK7, PAX 8, p53 and WT 1.  Patient started neoadjuvant carbotaxol and Avastin chemotherapy on 08/21/2018 and completed 4 cycles on 11/02/2018. Patient did not receive Avastin for cycle 4.  Pathology: DIAGNOSIS:  A. UTERUS AND CERVIX WITH BILATERAL FALLOPIAN TUBES AND OVARIES;  HYSTERECTOMY WITH BILATERAL SALPINGO-OOPHORECTOMY:  - HIGH-GRADE SEROUS ADENOCARCINOMA.  - REFER TO CANCER SUMMARY BELOW.   CANCER CASE SUMMARY: OVARY or FALLOPIAN TUBE or PRIMARY PERITONEUM  Procedure: Total hysterectomy with bilateral salpingo-oophorectomy  Specimen Integrity: Intact  Tumor Site: Bilateral ovaries and  fallopian tubes  Ovarian Surface Involvement: Present, bilateral  Fallopian Tube Surface Involvement: Present, left  Tumor Size: Greatest dimension 1.2 cm  Histologic Type: Serous carcinoma  Histologic Grade: High grade  Implants: N/A  Other Tissue/ Organ Involvement: Appendix, parametrium  Largest Extrapelvic Peritoneal Focus: Microscopic  Peritoneal/Ascitic Fluid: Positive for malignancy  Treatment Effect: Moderate response; see comment.  Regional Lymph Nodes: No nodes submitted or found  Pathologic Stage Classification (pTNM, AJCC 8th Edition): pT3a pNX pM1b  FIGO: IVB  TNM Descriptors: N/A   Comment:  Chemotherapy Response Score (CRS) is assigned based on evaluation of tumor response in the omentum, which is not submitted in this specimen. In the submitted tissue, there is a partial/moderate tumor response.   B. APPENDIX, APPENDECTOMY:  - TRANSMURAL INVOLVEMENT BY HIGH-GRADE SEROUS ADENOCARCINOMA.  Patient then completed 3 cycles of carbotaxol adjuvantly and is currently on maintenance mvasi  Interval history-right foot drop is slowly improving.  She does report one fall when she slipped on a wet surface.  Did not hit her head.  Appetite and weight are stable.  Reports no neuropathy in her fingers.  ECOG PS- 1 Pain scale- 0  Review of systems- Review of Systems  Constitutional: Positive for malaise/fatigue. Negative for chills, fever and weight loss.  HENT: Negative for congestion, ear discharge and nosebleeds.   Eyes: Negative for blurred vision.  Respiratory: Negative for cough, hemoptysis, sputum production, shortness of breath and wheezing.   Cardiovascular: Negative for chest pain, palpitations, orthopnea and claudication.  Gastrointestinal: Negative for abdominal pain, blood in stool, constipation, diarrhea, heartburn, melena, nausea and vomiting.  Genitourinary: Negative for dysuria, flank pain, frequency, hematuria and urgency.  Musculoskeletal: Negative for back  pain, joint pain and myalgias.  Skin: Negative for rash.  Neurological: Negative for dizziness, tingling, focal weakness, seizures, weakness and  headaches.  Endo/Heme/Allergies: Does not bruise/bleed easily.  Psychiatric/Behavioral: Negative for depression and suicidal ideas. The patient does not have insomnia.      Allergies  Allergen Reactions  . Clarithromycin Nausea Only  . Doxycycline Nausea Only    dizziness  . Penicillins Hives    Did it involve swelling of the face/tongue/throat, SOB, or low BP? No Did it involve sudden or severe rash/hives, skin peeling, or any reaction on the inside of your mouth or nose? No Did you need to seek medical attention at a hospital or doctor's office? No When did it last happen?50+ years ago If all above answers are "NO", may proceed with cephalosporin use.      Past Medical History:  Diagnosis Date  . Allergy   . Anemia   . Complication of anesthesia   . Diabetes mellitus without complication ([place])   . Diverticulosis   . Family history of adverse reaction to anesthesia    mom-n/v  . Family history of lung cancer   . GERD (gastroesophageal reflux disease)    h/o  . Hyperlipidemia   . Hypothyroidism   . Ovarian cancer ([place]) 08/04/2018   Chemo tx's.  . Personal history of chemotherapy   . PONV (postoperative nausea and vomiting)    sick with tonsillectomy  . 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 Thyroid disease      Past Surgical History:  Procedure Laterality Date  . ABDOMINAL HYSTERECTOMY    . BACK SURGERY  2007   Lumbar Surgery  . BREAST CYST ASPIRATION Right    Around 10 years ago. Pt thinks it was the right breast but not sure.  Marland Kitchen CATARACT EXTRACTION Left 2010   Right Eye 2011  . COLONOSCOPY WITH PROPOFOL N/A 09/02/2015   Procedure: COLONOSCOPY WITH PROPOFOL;  Surgeon: [name], MD;  Location: [place];  Service: Endoscopy;  Laterality: N/A;  Diabetic - oral meds  . ESOPHAGOGASTRODUODENOSCOPY  (EGD) WITH PROPOFOL N/A 11/03/2015   Procedure: ESOPHAGOGASTRODUODENOSCOPY (EGD) WITH PROPOFOL;  Surgeon: [name], MD;  Location: [place];  Service: Endoscopy;  Laterality: N/A;  . EYE SURGERY Bilateral 06/2014    tear duck catherization ( Dr. Vallarie Mare)  . LAPAROSCOPIC APPENDECTOMY  11/26/2018   Procedure: APPENDECTOMY LAPAROSCOPIC;  Surgeon: Ward, Honor Loh, MD;  Location: [facility];  Service: Gynecology;;  . POLYPECTOMY  09/02/2015   Procedure: POLYPECTOMY;  Surgeon: [name], MD;  Location: [place];  Service: Endoscopy;;  . TONSILLECTOMY  1963    Social History   Socioeconomic History  . Marital status: Single    Spouse name: Not on file  . Number of children: Not on file  . Years of education: Not on file  . Highest education level: Not on file  Occupational History  . Not on file  Tobacco Use  . Smoking status: Never Smoker  . Smokeless tobacco: Never Used  Substance and Sexual Activity  . Alcohol use: No  . Drug use: No  . Sexual activity: Not Currently  Other Topics Concern  . Not on file  Social History Narrative  . Not on file   Social Determinants of Health   Financial Resource Strain:   . Difficulty of Paying Living Expenses: Not on file  Food Insecurity:   . Worried About Charity fundraiser in the Last Year: Not on file  . Ran Out of Food in the Last Year: Not on file  Transportation Needs:   . Lack of Transportation (  Medical): Not on file  . Lack of Transportation (Non-Medical): Not on file  Physical Activity:   . Days of Exercise per Week: Not on file  . Minutes of Exercise per Session: Not on file  Stress:   . Feeling of Stress : Not on file  Social Connections:   . Frequency of Communication with Friends and Family: Not on file  . Frequency of Social Gatherings with Friends and Family: Not on file  . Attends Religious Services: Not on file  . Active Member of Clubs or Organizations: Not on file  . Attends Theatre manager Meetings: Not on file  . Marital Status: Not on file  Intimate Partner Violence:   . Fear of Current or Ex-Partner: Not on file  . Emotionally Abused: Not on file  . Physically Abused: Not on file  . Sexually Abused: Not on file    Family History  Problem Relation Age of Onset  . Congestive Heart Failure Mother   . Diabetes Mother   . Stroke Mother   . Hypothyroidism Father   . Cancer Father        lung cancer  . Diabetes Father   . Cancer Paternal Grandmother        stomach  . Breast cancer Neg Hx      Current Outpatient Medications:  .  acetaminophen (TYLENOL) 500 MG tablet, Take 2 tablets (1,000 mg total) by mouth every 6 (six) hours., Disp: , Rfl:  .  aspirin 81 MG tablet, Take 81 mg by mouth daily. , Disp: , Rfl:  .  dexamethasone (DECADRON) 4 MG tablet, Take 2 tablets (8 mg total) by mouth daily. Start the day after carboplatin chemotherapy for 3 days., Disp: 30 tablet, Rfl: 1 .  fexofenadine (ALLEGRA) 60 MG tablet, Take 60 mg by mouth daily as needed for allergies. , Disp: , Rfl:  .  furosemide (LASIX) 20 MG tablet, Take 1 tablet (20 mg total) by mouth daily., Disp: 20 tablet, Rfl: 0 .  glipiZIDE (GLUCOTROL) 5 MG tablet, TAKE 1 TABLET BY MOUTH DAILY BEFORE BREAKFAST (Patient taking differently: Take 5 mg by mouth daily before breakfast. ), Disp: 90 tablet, Rfl: 1 .  glucose blood (ONE TOUCH ULTRA TEST) test strip, USE TO TEST BLOOD SUGAR TWICE A DAY AS INSTRUCTED, Disp: 100 each, Rfl: 12 .  ibuprofen (ADVIL) 600 MG tablet, Take 1 tablet (600 mg total) by mouth every 6 (six) hours. (Patient taking differently: Take 600 mg by mouth every 6 (six) hours as needed. ), Disp: 45 tablet, Rfl: 1 .  levothyroxine (SYNTHROID) 100 MCG tablet, Take 1 tablet (100 mcg total) by mouth daily., Disp: 90 tablet, Rfl: 1 .  metFORMIN (GLUCOPHAGE) 500 MG tablet, Take 2 tablets (1,000 mg total) by mouth 2 (two) times daily., Disp: 360 tablet, Rfl: 1 .  MULTIPLE VITAMIN PO, Take 1  tablet by mouth daily. , Disp: , Rfl:  .  ONETOUCH DELICA LANCETS 97L MISC, USE TO TEST BLOOD SUGAR ONCE A DAY, Disp: 100 each, Rfl: 12 .  simvastatin (ZOCOR) 20 MG tablet, Take 1 tablet (20 mg total) by mouth at bedtime. Please schedule office visit before any future refills, Disp: 90 tablet, Rfl: 0 .  calcipotriene (DOVONOX) 0.005 % ointment, Apply 1 application topically as needed. CALCIPOTRIENE, 0.005% (External Ointment) - Historical Medication  apply to skin daily (0.005 %) Active, Disp: , Rfl:  .  gabapentin (NEURONTIN) 300 MG capsule, Take 1 capsule (300 mg total) by mouth at bedtime.  Take 2 tablets at bedtime once and then 3 tablets for three nights at bedtime. (Patient not taking: Reported on 03/16/2019), Disp: 11 capsule, Rfl: 0 .  LORazepam (ATIVAN) 0.5 MG tablet, Take 1 tablet (0.5 mg total) by mouth every 6 (six) hours as needed (Nausea or vomiting). (Patient not taking: Reported on 01/30/2019), Disp: 30 tablet, Rfl: 0 .  ondansetron (ZOFRAN) 8 MG tablet, Take 1 tablet (8 mg total) by mouth 2 (two) times daily as needed for refractory nausea / vomiting. Start on day 3 after carboplatin chemo. (Patient not taking: Reported on 01/30/2019), Disp: 30 tablet, Rfl: 1 .  oxyCODONE (ROXICODONE) 5 MG immediate release tablet, Take 1 tablet (5 mg total) by mouth every 8 (eight) hours as needed. (Patient not taking: Reported on 01/30/2019), Disp: 16 tablet, Rfl: 0 .  prochlorperazine (COMPAZINE) 10 MG tablet, Take 1 tablet (10 mg total) by mouth every 6 (six) hours as needed (Nausea or vomiting). (Patient not taking: Reported on 01/30/2019), Disp: 30 tablet, Rfl: 1 .  spironolactone (ALDACTONE) 50 MG tablet, Take 1 tablet (50 mg total) by mouth daily. (Patient not taking: Reported on 03/16/2019), Disp: 20 tablet, Rfl: 0 No current facility-administered medications for this visit.  Facility-Administered Medications Ordered in Other Visits:  .  sodium chloride flush (NS) 0.9 % injection 10 mL, 10 mL,  Intravenous, Once, Sindy Guadeloupe, MD .  sodium chloride flush (NS) 0.9 % injection 10 mL, 10 mL, Intravenous, Once, Sindy Guadeloupe, MD  Physical exam:  Vitals:   03/16/19 1316  BP: 123/74  Pulse: 72  Resp: 18  Temp: (!) 96.8 F (36 C)  TempSrc: Tympanic  SpO2: 100%  Weight: 107 lb 9.6 oz (48.8 kg)   Physical Exam HENT:     Head: Normocephalic and atraumatic.  Eyes:     Pupils: Pupils are equal, round, and reactive to light.  Cardiovascular:     Rate and Rhythm: Normal rate and regular rhythm.     Heart sounds: Normal heart sounds.  Pulmonary:     Effort: Pulmonary effort is normal.     Breath sounds: Normal breath sounds.  Abdominal:     General: Bowel sounds are normal.     Palpations: Abdomen is soft.  Musculoskeletal:     Cervical back: Normal range of motion.  Skin:    General: Skin is warm and dry.  Neurological:     Mental Status: She is alert and oriented to person, place, and time.     Comments: Power in the right dorsiflexors 4 out of 5      CMP Latest Ref Rng & Units 03/16/2019  Glucose 70 - 99 mg/dL 92  BUN 8 - 23 mg/dL 17  Creatinine 0.44 - 1.00 mg/dL 0.53  Sodium 135 - 145 mmol/L 130(L)  Potassium 3.5 - 5.1 mmol/L 4.3  Chloride 98 - 111 mmol/L 93(L)  CO2 22 - 32 mmol/L 26  Calcium 8.9 - 10.3 mg/dL 9.6  Total Protein 6.5 - 8.1 g/dL 7.0  Total Bilirubin 0.3 - 1.2 mg/dL 0.5  Alkaline Phos 38 - 126 U/L 44  AST 15 - 41 U/L 27  ALT 0 - 44 U/L 25   CBC Latest Ref Rng & Units 03/16/2019  WBC 4.0 - 10.5 K/uL 5.2  Hemoglobin 12.0 - 15.0 g/dL 11.1(L)  Hematocrit 36.0 - 46.0 % 33.9(L)  Platelets 150 - 400 K/uL 133(L)     Assessment and plan- Patient is a 64 y.o. 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 Avastin was held for cycle 4. She is s/p robotic assisted total laparoscopic hysterectomy and bilateral salpingo-oophorectomy and  appendectomy with extensive lysis of adhesions and abdomen debulking.  She has completed 6 cycles of carbotaxol chemotherapy ( 4 neoadjuvantly and 2 adjuvantly).    She is here for on treatment assessment prior to cycle 3 of maintenance Mvasi chemotherapy  Counts okay to proceed with cycle 3 of maintenance Mvasi chemotherapy today.  Her blood pressure is stable and urine protein remains trace.  Hemoglobin continues to be stable around 11.  She has mild thrombocytopenia which we will continue to monitor.  I will see her back in 3 weeks time with CBC with differential, CMP and urine protein for cycle 4.  Ca1 25 is currently within normal limits  Chemo-induced peripheral neuropathy: Right foot drop is currently improving and she remains on gabapentin with stable symptoms.   Visit Diagnosis 1. Encounter for monoclonal antibody treatment for malignancy   2. Bilateral primary ovarian cancer ([place])   3. Chemotherapy-induced peripheral neuropathy ([facility])      Dr. Randa Evens, MD, MPH [facility] [phone] 03/17/2019 1:24 PM

## 2019-03-18 ENCOUNTER — Other Ambulatory Visit: Payer: Self-pay

## 2019-03-18 ENCOUNTER — Inpatient Hospital Stay: Payer: Commercial Managed Care - PPO

## 2019-03-18 ENCOUNTER — Ambulatory Visit: Payer: Commercial Managed Care - PPO

## 2019-03-18 DIAGNOSIS — M21371 Foot drop, right foot: Secondary | ICD-10-CM

## 2019-03-18 DIAGNOSIS — R262 Difficulty in walking, not elsewhere classified: Secondary | ICD-10-CM

## 2019-03-18 NOTE — Therapy (Signed)
[facility] [address], [place], [address] Phone: [phone]   Fax:  [phone]  Physical Therapy Treatment/ Progress Note  Patient Details  Name: Meredith Jones MRN: [id number] Date of Birth: [birth year] Referring Provider (PT): Janese Banks MD  Reporting period: 02/24/2019 - 03/18/2019  Encounter Date: 03/18/2019  PT End of Session - 03/18/19 1611    Visit Number  10    Number of Visits  13    Date for PT Re-Evaluation  03/31/19    Authorization Type  10/10    PT Start Time  [id number]    PT Stop Time  1430    PT Time Calculation (min)  45 min    Equipment Utilized During Treatment  Gait belt    Activity Tolerance  Patient tolerated treatment well    Behavior During Therapy  [facility] for tasks assessed/performed       Past Medical History:  Diagnosis Date  . Allergy   . Anemia   . Complication of anesthesia   . Diabetes mellitus without complication ([place])   . Diverticulosis   . Family history of adverse reaction to anesthesia    mom-n/v  . Family history of lung cancer   . GERD (gastroesophageal reflux disease)    h/o  . Hyperlipidemia   . Hypothyroidism   . Ovarian cancer ([place]) 08/04/2018   Chemo tx's.  . Personal history of chemotherapy   . PONV (postoperative nausea and vomiting)    sick with tonsillectomy  . 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 Thyroid disease     Past Surgical History:  Procedure Laterality Date  . ABDOMINAL HYSTERECTOMY    . BACK SURGERY  2007   Lumbar Surgery  . BREAST CYST ASPIRATION Right    Around 10 years ago. Pt thinks it was the right breast but not sure.  Marland Kitchen CATARACT EXTRACTION Left 2010   Right Eye 2011  . COLONOSCOPY WITH PROPOFOL N/A 09/02/2015   Procedure: COLONOSCOPY WITH PROPOFOL;  Surgeon: [name], MD;  Location: [place];  Service: Endoscopy;  Laterality: N/A;  Diabetic - oral meds  . ESOPHAGOGASTRODUODENOSCOPY (EGD) WITH PROPOFOL N/A  11/03/2015   Procedure: ESOPHAGOGASTRODUODENOSCOPY (EGD) WITH PROPOFOL;  Surgeon: [name], MD;  Location: [place];  Service: Endoscopy;  Laterality: N/A;  . EYE SURGERY Bilateral 06/2014    tear duck catherization ( Dr. Vallarie Mare)  . LAPAROSCOPIC APPENDECTOMY  11/26/2018   Procedure: APPENDECTOMY LAPAROSCOPIC;  Surgeon: Ward, Honor Loh, MD;  Location: [facility];  Service: Gynecology;;  . POLYPECTOMY  09/02/2015   Procedure: POLYPECTOMY;  Surgeon: [name], MD;  Location: [place];  Service: Endoscopy;;  . TONSILLECTOMY  1963    There were no vitals filed for this visit.  Subjective Assessment - 03/18/19 1610    Subjective  Patient states that she has been independent in her HEP. Patient feels as if she is progressing well    Pertinent History  CA history, radiation/chemo treatments, neuropathy into the feet    Limitations  Walking    Currently in Pain?  No/denies       TREATMENT  Neuromuscular Reeducation Biphasic - patient was sitting and was told to actively contract anterior tibialis w/ the increase in intensity. Patient was assisted in DF movement by therapist Time: 10 minutes Frequency: 35Hz  Phase duration: 3500 usec Duty Cycle: 7/7  Squats 3 x 10. Tactile cue to decrease lateral shifting of hip as she sat  down glute Bridges w/ red theraband 2 x 12 Hooklying clam shells x 30 motor control  Hooklying clam shell w/ contralateral stabilization x 30ea motor control  Gait Training 5xSTS: 15.4 seconds 73mWT: .853m/s TUG: 12.46 seconds FGA: 18/30 FOTO: 66   Neuromuscular reeducation and gait training was conducted to increase motor control of LE muscular and assess patient's gait.     PT Education - 03/18/19 1611    Education Details  form/ technique with exercise    Person(s) Educated  Patient    Methods  Explanation;Demonstration;Tactile cues;Verbal cues    Comprehension  Verbalized understanding;Returned demonstration;Verbal cues  required;Tactile cues required       PT Short Term Goals - 02/24/19 1300      PT SHORT TERM GOAL #1   Title  Patient will be independent with HEP to continue benefits of therapy after discharge.    Baseline  Dependent with form/technique    Time  6    Period  Weeks    Status  New    Target Date  03/03/19        PT Long Term Goals - 02/24/19 1300      PT LONG TERM GOAL #1   Title  Patient will improve 5xSTS to under 12 sec to decrease fall risk and ability to stand from low surfaces    Baseline  5XSTS: 25sec    Time  6    Period  Weeks    Status  New      PT LONG TERM GOAL #2   Title  Patient will improve TUG to under 8 sec to indicate decrease fall risks most notably with transferring to amb.    Baseline  TUG: 12.5sec    Time  6    Period  Weeks    Status  New      PT LONG TERM GOAL #3   Title  Patient will improve ability to perform 35mWT to over 1 m/s to indicate increased safety with community amb.    Baseline  .8 m/s    Time  6    Period  Weeks    Status  New      PT LONG TERM GOAL #4   Title  Patient will improve FGA to 27 points to indicate improvement with functional and dynamic gait with exercise.    Baseline  20/30 FGA    Time  6    Period  Weeks    Status  New      PT LONG TERM GOAL #5   Title  Patient will improve her FOTO score to 77 to indicate significant improvement with her balance.    Baseline  55 FOTO    Time  6    Period  Weeks    Status  New              Patient will benefit from skilled therapeutic intervention in order to improve the following deficits and impairments:     Visit Diagnosis: Foot drop, right  Difficulty in walking, not elsewhere classified     Problem List Patient Active Problem List   Diagnosis Date Noted  . Bilateral primary ovarian cancer ([place]) 02/02/2019  . Genetic testing 12/09/2018  . Status post hysterectomy 11/26/2018  . Need for prophylactic vaccination and inoculation against influenza  11/04/2018  . Anemia of chronic disease 10/22/2018  . B12 deficiency 10/22/2018  . Family history of lung cancer   . Serous adenocarcinoma ([place]) 08/13/2018  . Goals of  care, counseling/discussion 08/11/2018  . Primary high grade serous adenocarcinoma of ovary ([place]) 08/11/2018  . Loss of weight   . Benign neoplasm of descending colon   . Type 2 diabetes mellitus without complication, without long-term current use of insulin ([place]) 06/07/2015  . 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 Cyst of ovary 06/09/2014  . Bulky or enlarged uterus 06/09/2014  . Hypercholesteremia 02/09/2007  . Allergic rhinitis 11/27/2006  . Endometriosis 11/27/2006  . Adult hypothyroidism 11/27/2006    Gloriann Loan, SPT  03/18/2019, 4:13 PM  [facility] [address], [place], [address] Phone: [phone]   Fax:  [phone]  Name: MIAMOR PIGOTT MRN: [id number] Date of Birth: [birth year]

## 2019-03-23 ENCOUNTER — Inpatient Hospital Stay: Payer: Commercial Managed Care - PPO

## 2019-03-24 ENCOUNTER — Ambulatory Visit: Payer: Commercial Managed Care - PPO | Admitting: Oncology

## 2019-03-24 ENCOUNTER — Other Ambulatory Visit: Payer: Self-pay

## 2019-03-24 ENCOUNTER — Inpatient Hospital Stay: Payer: Commercial Managed Care - PPO

## 2019-03-24 ENCOUNTER — Other Ambulatory Visit: Payer: Commercial Managed Care - PPO

## 2019-03-24 ENCOUNTER — Ambulatory Visit: Payer: Commercial Managed Care - PPO

## 2019-03-24 DIAGNOSIS — M21371 Foot drop, right foot: Secondary | ICD-10-CM | POA: Diagnosis not present

## 2019-03-24 DIAGNOSIS — R262 Difficulty in walking, not elsewhere classified: Secondary | ICD-10-CM

## 2019-03-24 NOTE — Therapy (Signed)
[facility] [address] [place], [address] Phone: [phone]   Fax:  [phone]  Physical Therapy Treatment  Patient Details  Name: SHAIANNA Jones MRN: [id number] Date of Birth: 10/23/1955 Referring Provider (PT): Janese Banks MD   Encounter Date: 03/24/2019  PT End of Session - 03/24/19 1436    Visit Number  11    Number of Visits  13    Date for PT Re-Evaluation  03/31/19    Authorization Type  10/10    PT Start Time  [id number]    PT Stop Time  1430    PT Time Calculation (min)  45 min    Equipment Utilized During Treatment  Gait belt    Activity Tolerance  Patient tolerated treatment well    Behavior During Therapy  [facility] for tasks assessed/performed       Past Medical History:  Diagnosis Date  . Allergy   . Anemia   . Complication of anesthesia   . Diabetes mellitus without complication ([place])   . Diverticulosis   . Family history of adverse reaction to anesthesia    mom-n/v  . Family history of lung cancer   . GERD (gastroesophageal reflux disease)    h/o  . Hyperlipidemia   . Hypothyroidism   . Ovarian cancer ([place]) 08/04/2018   Chemo tx's.  . Personal history of chemotherapy   . PONV (postoperative nausea and vomiting)    sick with tonsillectomy  . 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 Thyroid disease     Past Surgical History:  Procedure Laterality Date  . ABDOMINAL HYSTERECTOMY    . BACK SURGERY  2007   Lumbar Surgery  . BREAST CYST ASPIRATION Right    Around 10 years ago. Pt thinks it was the right breast but not sure.  Marland Kitchen CATARACT EXTRACTION Left 2010   Right Eye 2011  . COLONOSCOPY WITH PROPOFOL N/A 09/02/2015   Procedure: COLONOSCOPY WITH PROPOFOL;  Surgeon: [name], MD;  Location: [place];  Service: Endoscopy;  Laterality: N/A;  Diabetic - oral meds  . ESOPHAGOGASTRODUODENOSCOPY (EGD) WITH PROPOFOL N/A 11/03/2015   Procedure: ESOPHAGOGASTRODUODENOSCOPY (EGD) WITH  PROPOFOL;  Surgeon: [name], MD;  Location: [place];  Service: Endoscopy;  Laterality: N/A;  . EYE SURGERY Bilateral 06/2014    tear duck catherization ( Dr. Vallarie Mare)  . LAPAROSCOPIC APPENDECTOMY  11/26/2018   Procedure: APPENDECTOMY LAPAROSCOPIC;  Surgeon: Ward, Honor Loh, MD;  Location: [facility];  Service: Gynecology;;  . POLYPECTOMY  09/02/2015   Procedure: POLYPECTOMY;  Surgeon: [name], MD;  Location: [place];  Service: Endoscopy;;  . TONSILLECTOMY  1963    There were no vitals filed for this visit.  Subjective Assessment - 03/24/19 1434    Subjective  Patient states that has been busy at home cleaning and dodging cats. Patient has been feeling good and have had no falls recently. Just received her 1st covid-19 vaccine    Pertinent History  CA history, radiation/chemo treatments, neuropathy into the feet    Limitations  Walking    Currently in Pain?  No/denies        Neuromuscular Reeducation Biphasic - patient was sitting and was told to actively contract anterior tibialis w/ the increase in intensity. Patient was assisted in DF movement by therapist Time: 10 minutes Frequency: 50Hz  Phase duration: 700 usec Duty Cycle: 7/7  TE  Squats 2 x 12 Hooklying w/ Contralateral Leg stabilization x  20 ea side (motor control)  Clam shells w/ red band x 20 (too easy) Standing Isometric Hip Abduction 2 x 10 with tactile cues to prevent aberrant movement in UE Tandem walking 5 x 10 ft CGA    Neuromuscular Reeducation and therapeutic exercise performed to increase tibialis posterior activation and increase LE strength    PT Education - 03/24/19 1435    Education Details  form/technique with exercise    Person(s) Educated  Patient    Methods  Explanation;Demonstration;Tactile cues;Verbal cues    Comprehension  Verbalized understanding;Returned demonstration;Verbal cues required;Tactile cues required       PT Short Term Goals - 03/18/19 1708       PT SHORT TERM GOAL #1   Title  Patient will be independent with HEP to continue benefits of therapy after discharge.    Baseline  Dependent with form/technique    Time  6    Period  Weeks    Status  New    Target Date  04/29/19        PT Long Term Goals - 03/18/19 1708      PT LONG TERM GOAL #1   Title  Patient will improve 5xSTS to under 12 sec to decrease fall risk and ability to stand from low surfaces    Baseline  5XSTS: 25sec. 03/18/19: 15.4sec    Time  6    Period  Weeks    Status  On-going    Target Date  04/29/19      PT LONG TERM GOAL #2   Title  Patient will improve TUG to under 8 sec to indicate decrease fall risks most notably with transferring to amb.    Baseline  TUG: 12.5sec, 03/18/19: 12.46 seconds    Time  6    Period  Weeks    Status  On-going    Target Date  04/29/19      PT LONG TERM GOAL #3   Title  Patient will improve ability to perform 4mWT to over 1 m/s to indicate increased safety with community amb.    Baseline  .8 m/s, 03/18/19: .860m/s    Time  6    Period  Weeks    Status  On-going    Target Date  04/29/19      PT LONG TERM GOAL #4   Title  Patient will improve FGA to 27 points to indicate improvement with functional and dynamic gait with exercise.    Baseline  20/30 FGA, 03/18/19 FGA: 18 /30    Time  6    Period  Weeks    Status  On-going    Target Date  04/29/19      PT LONG TERM GOAL #5   Title  Patient will improve her FOTO score to 77 to indicate significant improvement with her balance.    Baseline  55 FOTO, 03/18/19: 66    Time  6    Period  Weeks    Status  On-going    Target Date  04/29/19       Plan - 03/24/19 1447    Clinical Impression Statement  Patient continues to demonstrate improvement in strength as evidenced by increased AROM w/ anterior tibialis activation. However, patient continues to demonstrate aberrant movement w/ various exercises involving the glute med and miminus indicating motor contorl impairments and  weakness. Patient would continue to benefit from low level exercises that target the hip abductors and requires a lot of tactile cueing to prevent aberrant movement.  Patient would continue to benefit from continued skiled therapy to return her to PLOF    Personal Factors and Comorbidities  Age;Comorbidity 3+;Past/Current Experience;Other    Comorbidities  hx of CA, neuropathy, drop foot    Examination-Activity Limitations  Carry;Stairs;Stand;Bend    Examination-Participation Restrictions  Cleaning;Community Activity;Yard Work    [employer] officer  Evolving/Moderate complexity    Rehab Potential  Fair    PT Frequency  2x / week    PT Duration  6 weeks    PT Treatment/Interventions  Balance training;Neuromuscular re-education;Functional mobility training;Therapeutic activities;Gait training;Stair training;Electrical Stimulation;Manual techniques;Energy conservation;Patient/family education;Therapeutic exercise    PT Next Visit Plan  Hip strength, dyanmic balance, Squats, low functioning glute med exercises    PT Home Exercise Plan  Sit to stands; SLS; hip abductor strength    Consulted and Agree with Plan of Care  Patient       Patient will benefit from skilled therapeutic intervention in order to improve the following deficits and impairments:  Decreased coordination, Decreased mobility, Decreased endurance, Decreased range of motion, Decreased activity tolerance, Decreased strength, Decreased balance, Difficulty walking, Pain, Abnormal gait  Visit Diagnosis: Foot drop, right  Difficulty in walking, not elsewhere classified     Problem List Patient Active Problem List   Diagnosis Date Noted  . Bilateral primary ovarian cancer ([place]) 02/02/2019  . Genetic testing 12/09/2018  . Status post hysterectomy 11/26/2018  . Need for prophylactic vaccination and inoculation against influenza 11/04/2018  . Anemia of chronic disease 10/22/2018  . B12 deficiency 10/22/2018  .  Family history of lung cancer   . 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 Goals of care, counseling/discussion 08/11/2018  . 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 Loss of weight   . Benign neoplasm of descending colon   . Type 2 diabetes mellitus without complication, without long-term current use of insulin ([place]) 06/07/2015  . 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 Cyst of ovary 06/09/2014  . Bulky or enlarged uterus 06/09/2014  . Hypercholesteremia 02/09/2007  . Allergic rhinitis 11/27/2006  . Endometriosis 11/27/2006  . Adult hypothyroidism 11/27/2006    Gloriann Loan, SPT 03/24/2019, 2:52 PM  [facility] [address], [place], [address] Phone: [phone]   Fax:  [phone]  Name: NYILAH DASILVA MRN: [id number] Date of Birth: Jun 01, 1955

## 2019-03-26 ENCOUNTER — Ambulatory Visit: Payer: Commercial Managed Care - PPO

## 2019-03-26 ENCOUNTER — Other Ambulatory Visit: Payer: Self-pay

## 2019-03-26 ENCOUNTER — Inpatient Hospital Stay: Payer: Commercial Managed Care - PPO

## 2019-03-26 DIAGNOSIS — R262 Difficulty in walking, not elsewhere classified: Secondary | ICD-10-CM

## 2019-03-26 DIAGNOSIS — M21371 Foot drop, right foot: Secondary | ICD-10-CM | POA: Diagnosis not present

## 2019-03-26 NOTE — Therapy (Signed)
[facility] [address] [place], [address] Phone: [phone]   Fax:  [phone]  Physical Therapy Treatment  Patient Details  Name: Meredith Jones MRN: [id number] Date of Birth: November 16, 1955 Referring Provider (PT): Janese Banks MD   Encounter Date: 03/26/2019  PT End of Session - 03/26/19 1513    Visit Number  12    Number of Visits  13    Date for PT Re-Evaluation  03/31/19    Authorization Type  2/10    PT Start Time  [id number]    PT Stop Time  1500    PT Time Calculation (min)  45 min    Equipment Utilized During Treatment  Gait belt    Activity Tolerance  Patient tolerated treatment well    Behavior During Therapy  [facility] for tasks assessed/performed       Past Medical History:  Diagnosis Date  . Allergy   . Anemia   . Complication of anesthesia   . Diabetes mellitus without complication ([place])   . Diverticulosis   . Family history of adverse reaction to anesthesia    mom-n/v  . Family history of lung cancer   . GERD (gastroesophageal reflux disease)    h/o  . Hyperlipidemia   . Hypothyroidism   . Ovarian cancer ([place]) 08/04/2018   Chemo tx's.  . Personal history of chemotherapy   . PONV (postoperative nausea and vomiting)    sick with tonsillectomy  . 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 Thyroid disease     Past Surgical History:  Procedure Laterality Date  . ABDOMINAL HYSTERECTOMY    . BACK SURGERY  2007   Lumbar Surgery  . BREAST CYST ASPIRATION Right    Around 10 years ago. Pt thinks it was the right breast but not sure.  Marland Kitchen CATARACT EXTRACTION Left 2010   Right Eye 2011  . COLONOSCOPY WITH PROPOFOL N/A 09/02/2015   Procedure: COLONOSCOPY WITH PROPOFOL;  Surgeon: [name], MD;  Location: [place];  Service: Endoscopy;  Laterality: N/A;  Diabetic - oral meds  . ESOPHAGOGASTRODUODENOSCOPY (EGD) WITH PROPOFOL N/A 11/03/2015   Procedure: ESOPHAGOGASTRODUODENOSCOPY (EGD) WITH  PROPOFOL;  Surgeon: [name], MD;  Location: [place];  Service: Endoscopy;  Laterality: N/A;  . EYE SURGERY Bilateral 06/2014    tear duck catherization ( Dr. Vallarie Mare)  . LAPAROSCOPIC APPENDECTOMY  11/26/2018   Procedure: APPENDECTOMY LAPAROSCOPIC;  Surgeon: Ward, Honor Loh, MD;  Location: [facility];  Service: Gynecology;;  . POLYPECTOMY  09/02/2015   Procedure: POLYPECTOMY;  Surgeon: [name], MD;  Location: [place];  Service: Endoscopy;;  . TONSILLECTOMY  1963    There were no vitals filed for this visit.  Subjective Assessment - 03/26/19 1511    Subjective  Patient says that she has been doing well at home. Has enjoyed coming into therapy and feels like she is getting better. Arm was sore from covid vaccine shot    Pertinent History  CA history, radiation/chemo treatments, neuropathy into the feet    Limitations  Walking    Currently in Pain?  No/denies       TREATMENT  Neuromuscular Reeducation   TA Standing side shuffles x 15 ea w/ CGA SLS w/ no UE support 3 x 30 sec Tandem walking 5 x 60ft  Standing hip abductions w/ 10 sec isometric hold against wall 2 x 10 ea Standing Hip extensions 2 x 12 ea STS YTB around  knees to cue for knee stability control 2 x 8   Therapeutic activities and neuromuscular reeducation utilized to increase LE strength and proprioception w/ functional activities   PT Education - 03/26/19 1512    Education Details  form/technique with exercise    Person(s) Educated  Patient    Methods  Explanation;Demonstration;Tactile cues;Verbal cues    Comprehension  Verbalized understanding;Returned demonstration;Verbal cues required;Tactile cues required       PT Short Term Goals - 03/18/19 1708      PT SHORT TERM GOAL #1   Title  Patient will be independent with HEP to continue benefits of therapy after discharge.    Baseline  Dependent with form/technique    Time  6    Period  Weeks    Status  New    Target Date   04/29/19        PT Long Term Goals - 03/18/19 1708      PT LONG TERM GOAL #1   Title  Patient will improve 5xSTS to under 12 sec to decrease fall risk and ability to stand from low surfaces    Baseline  5XSTS: 25sec. 03/18/19: 15.4sec    Time  6    Period  Weeks    Status  On-going    Target Date  04/29/19      PT LONG TERM GOAL #2   Title  Patient will improve TUG to under 8 sec to indicate decrease fall risks most notably with transferring to amb.    Baseline  TUG: 12.5sec, 03/18/19: 12.46 seconds    Time  6    Period  Weeks    Status  On-going    Target Date  04/29/19      PT LONG TERM GOAL #3   Title  Patient will improve ability to perform 46mWT to over 1 m/s to indicate increased safety with community amb.    Baseline  .8 m/s, 03/18/19: .842m/s    Time  6    Period  Weeks    Status  On-going    Target Date  04/29/19      PT LONG TERM GOAL #4   Title  Patient will improve FGA to 27 points to indicate improvement with functional and dynamic gait with exercise.    Baseline  20/30 FGA, 03/18/19 FGA: 18 /30    Time  6    Period  Weeks    Status  On-going    Target Date  04/29/19      PT LONG TERM GOAL #5   Title  Patient will improve her FOTO score to 77 to indicate significant improvement with her balance.    Baseline  55 FOTO, 03/18/19: 66    Time  6    Period  Weeks    Status  On-going    Target Date  04/29/19            Plan - 03/26/19 1514    Clinical Impression Statement  Patient shows improvement w/ body proprioception as evidenced by ability to activate glute medius/minimus muscle in standing isometric w/o large aberrant movement. Another indication of improvement w/ proprioception was evidenced by decreased UE support in SLS exercise bilaterally. However, patient does require cueing at the pelvis to ensure stability w/ hip abduction indictaing decreased motor control. Despite motor control impairments, patient would continue to benefit from motor control work w/  functional activities. Patient requires continued skilled therapy to return to PLOF    Personal Factors and Comorbidities  Age;Comorbidity 3+;Past/Current Experience;Other    Comorbidities  hx of CA, neuropathy, drop foot    Examination-Activity Limitations  Carry;Stairs;Stand;Bend    Examination-Participation Restrictions  Cleaning;Community Activity;Yard Work    [employer] officer  Evolving/Moderate complexity    Rehab Potential  Fair    PT Frequency  2x / week    PT Duration  6 weeks    PT Treatment/Interventions  Balance training;Neuromuscular re-education;Functional mobility training;Therapeutic activities;Gait training;Stair training;Electrical Stimulation;Manual techniques;Energy conservation;Patient/family education;Therapeutic exercise    PT Next Visit Plan  Hip strength, dyanmic balance, Squats, low functioning glute med exercises    PT Home Exercise Plan  Sit to stands; SLS; hip abductor strength    Consulted and Agree with Plan of Care  Patient       Patient will benefit from skilled therapeutic intervention in order to improve the following deficits and impairments:  Decreased coordination, Decreased mobility, Decreased endurance, Decreased range of motion, Decreased activity tolerance, Decreased strength, Decreased balance, Difficulty walking, Pain, Abnormal gait  Visit Diagnosis: Foot drop, right  Difficulty in walking, not elsewhere classified     Problem List Patient Active Problem List   Diagnosis Date Noted  . Bilateral primary ovarian cancer ([place]) 02/02/2019  . Genetic testing 12/09/2018  . Status post hysterectomy 11/26/2018  . Need for prophylactic vaccination and inoculation against influenza 11/04/2018  . Anemia of chronic disease 10/22/2018  . B12 deficiency 10/22/2018  . Family history of lung cancer   . Serous adenocarcinoma ([place]) 08/13/2018  . Goals of care, counseling/discussion 08/11/2018  . Primary high grade serous adenocarcinoma  of ovary ([place]) 08/11/2018  . Loss of weight   . Benign neoplasm of descending colon   . Type 2 diabetes mellitus without complication, without long-term current use of insulin ([place]) 06/07/2015  . 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 Cyst of ovary 06/09/2014  . Bulky or enlarged uterus 06/09/2014  . Hypercholesteremia 02/09/2007  . Allergic rhinitis 11/27/2006  . Endometriosis 11/27/2006  . Adult hypothyroidism 11/27/2006    Gloriann Loan, SPT  03/26/2019, 3:25 PM  [facility] [address] [place], [address] Phone: [phone]   Fax:  [phone]  Name: Meredith Jones MRN: [id number] Date of Birth: December 28, 1955

## 2019-03-30 ENCOUNTER — Other Ambulatory Visit: Payer: Self-pay

## 2019-03-30 ENCOUNTER — Ambulatory Visit: Payer: Commercial Managed Care - PPO

## 2019-03-30 DIAGNOSIS — R262 Difficulty in walking, not elsewhere classified: Secondary | ICD-10-CM

## 2019-03-30 DIAGNOSIS — M21371 Foot drop, right foot: Secondary | ICD-10-CM

## 2019-03-30 NOTE — Therapy (Signed)
[facility] [address] [place], [address] Phone: [phone]   Fax:  [phone]  Physical Therapy Treatment  Patient Details  Name: Meredith Jones MRN: [id number] Date of Birth: March 24, 1955 Referring Provider (PT): Janese Banks MD   Encounter Date: 03/30/2019  PT End of Session - 03/30/19 1630    Visit Number  13    Number of Visits  13    Date for PT Re-Evaluation  03/31/19    Authorization Type  3/10    PT Start Time  [id number]    PT Stop Time  1430    PT Time Calculation (min)  45 min    Equipment Utilized During Treatment  Gait belt    Activity Tolerance  Patient tolerated treatment well    Behavior During Therapy  [facility] for tasks assessed/performed       Past Medical History:  Diagnosis Date  . Allergy   . Anemia   . Complication of anesthesia   . Diabetes mellitus without complication ([place])   . Diverticulosis   . Family history of adverse reaction to anesthesia    mom-n/v  . Family history of lung cancer   . GERD (gastroesophageal reflux disease)    h/o  . Hyperlipidemia   . Hypothyroidism   . Ovarian cancer ([place]) 08/04/2018   Chemo tx's.  . Personal history of chemotherapy   . PONV (postoperative nausea and vomiting)    sick with tonsillectomy  . 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 Thyroid disease     Past Surgical History:  Procedure Laterality Date  . ABDOMINAL HYSTERECTOMY    . BACK SURGERY  2007   Lumbar Surgery  . BREAST CYST ASPIRATION Right    Around 10 years ago. Pt thinks it was the right breast but not sure.  Marland Kitchen CATARACT EXTRACTION Left 2010   Right Eye 2011  . COLONOSCOPY WITH PROPOFOL N/A 09/02/2015   Procedure: COLONOSCOPY WITH PROPOFOL;  Surgeon: [name], MD;  Location: [place];  Service: Endoscopy;  Laterality: N/A;  Diabetic - oral meds  . ESOPHAGOGASTRODUODENOSCOPY (EGD) WITH PROPOFOL N/A 11/03/2015   Procedure: ESOPHAGOGASTRODUODENOSCOPY (EGD) WITH  PROPOFOL;  Surgeon: [name], MD;  Location: [facility];  Service: Endoscopy;  Laterality: N/A;  . EYE SURGERY Bilateral 06/2014    tear duck catherization ( Dr. Vallarie Mare)  . LAPAROSCOPIC APPENDECTOMY  11/26/2018   Procedure: APPENDECTOMY LAPAROSCOPIC;  Surgeon: Ward, Honor Loh, MD;  Location: [facility];  Service: Gynecology;;  . POLYPECTOMY  09/02/2015   Procedure: POLYPECTOMY;  Surgeon: [name], MD;  Location: [facility];  Service: Endoscopy;;  . TONSILLECTOMY  1963    There were no vitals filed for this visit.  Subjective Assessment - 03/30/19 1628    Subjective  Patient has been independent in her HEP to be functionally independent. Still awaiting AFO    Pertinent History  CA history, radiation/chemo treatments, neuropathy into the feet    Limitations  Walking    Currently in Pain?  No/denies        TREATMENT    Neuromuscular Reeducation Biphasic - patient was sitting and was told to actively contract anterior tibialis w/ the increase in intensity. Patient was assisted in DF movement by therapist Time: 12 minutes Frequency: 35Hz  Phase duration: 600 usec Duty Cycle: 7/7  TA Eccentric toe drops 2 x 10 Pushing toe into max DF and ask patient to hold toes up as long as possible  SLS 3 x 30, attempt w/ no support UE  Stairs x 12 SL up, DL down - cue for going straight down Standing isometric abductions 3 x 8 w/ 5 sec holds    Therapeutic activities and Neuromuscular reeducation were conducted to increase LE strength and proprioception w/ functional activities   PT Education - 03/30/19 1629    Education Details  form/technique w/ exercise    Person(s) Educated  Patient    Methods  Explanation;Demonstration;Tactile cues;Verbal cues    Comprehension  Verbal cues required;Tactile cues required;Returned demonstration;Verbalized understanding       PT Short Term Goals - 03/18/19 1708      PT SHORT TERM GOAL #1   Title  Patient will be independent  with HEP to continue benefits of therapy after discharge.    Baseline  Dependent with form/technique    Time  6    Period  Weeks    Status  New    Target Date  04/29/19        PT Long Term Goals - 03/18/19 1708      PT LONG TERM GOAL #1   Title  Patient will improve 5xSTS to under 12 sec to decrease fall risk and ability to stand from low surfaces    Baseline  5XSTS: 25sec. 03/18/19: 15.4sec    Time  6    Period  Weeks    Status  On-going    Target Date  04/29/19      PT LONG TERM GOAL #2   Title  Patient will improve TUG to under 8 sec to indicate decrease fall risks most notably with transferring to amb.    Baseline  TUG: 12.5sec, 03/18/19: 12.46 seconds    Time  6    Period  Weeks    Status  On-going    Target Date  04/29/19      PT LONG TERM GOAL #3   Title  Patient will improve ability to perform 51mWT to over 1 m/s to indicate increased safety with community amb.    Baseline  .8 m/s, 03/18/19: .853m/s    Time  6    Period  Weeks    Status  On-going    Target Date  04/29/19      PT LONG TERM GOAL #4   Title  Patient will improve FGA to 27 points to indicate improvement with functional and dynamic gait with exercise.    Baseline  20/30 FGA, 03/18/19 FGA: 18 /30    Time  6    Period  Weeks    Status  On-going    Target Date  04/29/19      PT LONG TERM GOAL #5   Title  Patient will improve her FOTO score to 77 to indicate significant improvement with her balance.    Baseline  55 FOTO, 03/18/19: 66    Time  6    Period  Weeks    Status  On-going    Target Date  04/29/19            Plan - 03/30/19 1630    Clinical Impression Statement  Patient shows improvment in anterior tibialis strength as noted by an ability to hold DF position through eccentric contraction. Despite continued strength work on the LE, patient still shows weakness w/ hip abduction as evidenced by trendelenberg gait. Patient would benefit continue to benefit from functional strengthening activities  to target the hip musculature in order to decrease fall risk at home. Patient would  continue to benefit from continued skilled therapy to return her to PLOF    Personal Factors and Comorbidities  Age;Comorbidity 3+;Past/Current Experience;Other    Comorbidities  hx of CA, neuropathy, drop foot    Examination-Activity Limitations  Carry;Stairs;Stand;Bend    Examination-Participation Restrictions  Cleaning;Community Activity;Yard Work    [employer]  Evolving/Moderate complexity    Rehab Potential  Fair    PT Frequency  2x / week    PT Duration  6 weeks    PT Treatment/Interventions  Balance training;Neuromuscular re-education;Functional mobility training;Therapeutic activities;Gait training;Stair training;Electrical Stimulation;Manual techniques;Energy conservation;Patient/family education;Therapeutic exercise    PT Next Visit Plan  Hip strength, dyanmic balance, Squats, low functioning glute med exercises    PT Home Exercise Plan  Sit to stands; SLS; hip abductor strength    Consulted and Agree with Plan of Care  Patient       Patient will benefit from skilled therapeutic intervention in order to improve the following deficits and impairments:  Decreased coordination, Decreased mobility, Decreased endurance, Decreased range of motion, Decreased activity tolerance, Decreased strength, Decreased balance, Difficulty walking, Pain, Abnormal gait  Visit Diagnosis: Foot drop, right  Difficulty in walking, not elsewhere classified     Problem List Patient Active Problem List   Diagnosis Date Noted  . Bilateral primary ovarian cancer ([place]) 02/02/2019  . Genetic testing 12/09/2018  . Status post hysterectomy 11/26/2018  . Need for prophylactic vaccination and inoculation against influenza 11/04/2018  . Anemia of chronic disease 10/22/2018  . B12 deficiency 10/22/2018  . Family history of lung cancer   . 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 Goals of care,  counseling/discussion 08/11/2018  . 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 Loss of weight   . Benign neoplasm of descending colon   . Type 2 diabetes mellitus without complication, without long-term current use of insulin ([place]) 06/07/2015  . 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 Cyst of ovary 06/09/2014  . Bulky or enlarged uterus 06/09/2014  . Hypercholesteremia 02/09/2007  . Allergic rhinitis 11/27/2006  . Endometriosis 11/27/2006  . Adult hypothyroidism 11/27/2006    Gloriann Loan, SPT 03/30/2019, 4:47 PM  [facility] [address], [place], [address] Phone: [phone]   Fax:  [phone]  Name: RODRICKA CATRON MRN: [id number] Date of Birth: 20-Aug-1955

## 2019-04-02 ENCOUNTER — Ambulatory Visit: Payer: Commercial Managed Care - PPO

## 2019-04-03 ENCOUNTER — Other Ambulatory Visit: Payer: Self-pay

## 2019-04-03 ENCOUNTER — Encounter: Payer: Self-pay | Admitting: Oncology

## 2019-04-03 NOTE — Progress Notes (Signed)
Patient stated that she had been doing well except for frequent urination. Patient stated that her sugars sometimes sneak up on her.

## 2019-04-06 ENCOUNTER — Inpatient Hospital Stay: Payer: Commercial Managed Care - PPO

## 2019-04-06 ENCOUNTER — Inpatient Hospital Stay (HOSPITAL_BASED_OUTPATIENT_CLINIC_OR_DEPARTMENT_OTHER): Payer: Commercial Managed Care - PPO | Admitting: Oncology

## 2019-04-06 ENCOUNTER — Other Ambulatory Visit: Payer: Self-pay

## 2019-04-06 ENCOUNTER — Telehealth: Payer: Self-pay | Admitting: *Deleted

## 2019-04-06 VITALS — BP 140/88 | HR 75 | Temp 96.8°F | Ht 64.0 in | Wt 110.0 lb

## 2019-04-06 DIAGNOSIS — G620 Drug-induced polyneuropathy: Secondary | ICD-10-CM | POA: Diagnosis not present

## 2019-04-06 DIAGNOSIS — C801 Malignant (primary) neoplasm, unspecified: Secondary | ICD-10-CM

## 2019-04-06 DIAGNOSIS — Z5112 Encounter for antineoplastic immunotherapy: Secondary | ICD-10-CM | POA: Diagnosis not present

## 2019-04-06 DIAGNOSIS — Z95828 Presence of other vascular implants and grafts: Secondary | ICD-10-CM

## 2019-04-06 DIAGNOSIS — C563 Malignant neoplasm of bilateral ovaries: Secondary | ICD-10-CM

## 2019-04-06 DIAGNOSIS — C561 Malignant neoplasm of right ovary: Secondary | ICD-10-CM

## 2019-04-06 DIAGNOSIS — T451X5A Adverse effect of antineoplastic and immunosuppressive drugs, initial encounter: Secondary | ICD-10-CM

## 2019-04-06 DIAGNOSIS — E538 Deficiency of other specified B group vitamins: Secondary | ICD-10-CM

## 2019-04-06 DIAGNOSIS — D539 Nutritional anemia, unspecified: Secondary | ICD-10-CM

## 2019-04-06 LAB — COMPREHENSIVE METABOLIC PANEL
ALT: 29 U/L (ref 0–44)
AST: 27 U/L (ref 15–41)
Albumin: 3.5 g/dL (ref 3.5–5.0)
Alkaline Phosphatase: 46 U/L (ref 38–126)
Anion gap: 11 (ref 5–15)
BUN: 15 mg/dL (ref 8–23)
CO2: 28 mmol/L (ref 22–32)
Calcium: 9.7 mg/dL (ref 8.9–10.3)
Chloride: 89 mmol/L — ABNORMAL LOW (ref 98–111)
Creatinine, Ser: 0.55 mg/dL (ref 0.44–1.00)
GFR calc Af Amer: >60 mL/min (ref >60)
GFR calc non Af Amer: >60 mL/min (ref >60)
Glucose, Bld: 136 mg/dL — ABNORMAL HIGH (ref 70–99)
Potassium: 4 mmol/L (ref 3.5–5.1)
Sodium: 128 mmol/L — ABNORMAL LOW (ref 135–145)
Total Bilirubin: 0.4 mg/dL (ref 0.3–1.2)
Total Protein: 7 g/dL (ref 6.5–8.1)

## 2019-04-06 LAB — CBC WITH DIFFERENTIAL/PLATELET
Abs Immature Granulocytes: 0.01 10*3/uL (ref 0.00–0.07)
Basophils Absolute: 0 10*3/uL (ref 0.0–0.1)
Basophils Relative: 1 %
Eosinophils Absolute: 0.6 10*3/uL — ABNORMAL HIGH (ref 0.0–0.5)
Eosinophils Relative: 14 %
HCT: 33.5 % — ABNORMAL LOW (ref 36.0–46.0)
Hemoglobin: 11.2 g/dL — ABNORMAL LOW (ref 12.0–15.0)
Immature Granulocytes: 0 %
Lymphocytes Relative: 16 %
Lymphs Abs: 0.7 10*3/uL (ref 0.7–4.0)
MCH: 35.3 pg — ABNORMAL HIGH (ref 26.0–34.0)
MCHC: 33.4 g/dL (ref 30.0–36.0)
MCV: 105.7 fL — ABNORMAL HIGH (ref 80.0–100.0)
Monocytes Absolute: 0.4 10*3/uL (ref 0.1–1.0)
Monocytes Relative: 9 %
Neutro Abs: 2.5 10*3/uL (ref 1.7–7.7)
Neutrophils Relative %: 60 %
Platelets: 136 10*3/uL — ABNORMAL LOW (ref 150–400)
RBC: 3.17 MIL/uL — ABNORMAL LOW (ref 3.87–5.11)
RDW: 13.9 % (ref 11.5–15.5)
WBC: 4.3 10*3/uL (ref 4.0–10.5)
nRBC: 0 % (ref 0.0–0.2)

## 2019-04-06 LAB — PROTEIN, URINE, RANDOM: Total Protein, Urine: 7 mg/dL

## 2019-04-06 MED ORDER — SODIUM CHLORIDE 0.9 % IV SOLN
15.0000 mg/kg | Freq: Once | INTRAVENOUS | Status: AC
  Administered 2019-04-06: 800 mg via INTRAVENOUS
  Filled 2019-04-06: qty 32

## 2019-04-06 MED ORDER — SODIUM CHLORIDE 0.9 % IV SOLN
Freq: Once | INTRAVENOUS | Status: AC
  Filled 2019-04-06: qty 250

## 2019-04-06 MED ORDER — SODIUM CHLORIDE 0.9% FLUSH
10.0000 mL | Freq: Once | INTRAVENOUS | Status: AC
  Filled 2019-04-06: qty 10

## 2019-04-06 NOTE — Telephone Encounter (Signed)
-----   Message from Sindy Guadeloupe, MD sent at 04/06/2019 12:16 PM EST ----- Can you ask her to increase her fluid intake? Her sodium/ chloride is low

## 2019-04-06 NOTE — Telephone Encounter (Signed)
Pt states that she drinks 3  boost a day, then 4 glasses of water. She will drink 2 more glasses of water and she will eat a little of salt. It will be rechecked next visit

## 2019-04-07 ENCOUNTER — Other Ambulatory Visit: Payer: Self-pay

## 2019-04-07 ENCOUNTER — Ambulatory Visit: Payer: Commercial Managed Care - PPO

## 2019-04-07 ENCOUNTER — Inpatient Hospital Stay: Payer: Commercial Managed Care - PPO

## 2019-04-07 DIAGNOSIS — M21371 Foot drop, right foot: Secondary | ICD-10-CM

## 2019-04-07 DIAGNOSIS — R262 Difficulty in walking, not elsewhere classified: Secondary | ICD-10-CM

## 2019-04-07 NOTE — Therapy (Signed)
[facility] [address], [place], [address] Phone: [phone]   Fax:  [phone]  Physical Therapy Treatment  Patient Details  Name: Meredith Jones MRN: [id number] Date of Birth: 02-20-1955 Referring Provider (PT): Janese Banks MD   Encounter Date: 04/07/2019  PT End of Session - 04/07/19 1310    Visit Number  13    Number of Visits  13    Date for PT Re-Evaluation  03/31/19    Authorization Type  4/10    PT Start Time  1300    PT Stop Time  1345    PT Time Calculation (min)  45 min    Equipment Utilized During Treatment  Gait belt    Activity Tolerance  Patient tolerated treatment well    Behavior During Therapy  [facility] for tasks assessed/performed       Past Medical History:  Diagnosis Date  . Allergy   . Anemia   . Complication of anesthesia   . Diabetes mellitus without complication ([place])   . Diverticulosis   . Family history of adverse reaction to anesthesia    mom-n/v  . Family history of lung cancer   . GERD (gastroesophageal reflux disease)    h/o  . Hyperlipidemia   . Hypothyroidism   . Ovarian cancer ([place]) 08/04/2018   Chemo tx's.  . Personal history of chemotherapy   . PONV (postoperative nausea and vomiting)    sick with tonsillectomy  . 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 Thyroid disease     Past Surgical History:  Procedure Laterality Date  . ABDOMINAL HYSTERECTOMY    . BACK SURGERY  2007   Lumbar Surgery  . BREAST CYST ASPIRATION Right    Around 10 years ago. Pt thinks it was the right breast but not sure.  Marland Kitchen CATARACT EXTRACTION Left 2010   Right Eye 2011  . COLONOSCOPY WITH PROPOFOL N/A 09/02/2015   Procedure: COLONOSCOPY WITH PROPOFOL;  Surgeon: [name], MD;  Location: [place];  Service: Endoscopy;  Laterality: N/A;  Diabetic - oral meds  . ESOPHAGOGASTRODUODENOSCOPY (EGD) WITH PROPOFOL N/A 11/03/2015   Procedure: ESOPHAGOGASTRODUODENOSCOPY (EGD) WITH  PROPOFOL;  Surgeon: [name], MD;  Location: [facility];  Service: Endoscopy;  Laterality: N/A;  . EYE SURGERY Bilateral 06/2014    tear duck catherization ( Dr. Vallarie Mare)  . LAPAROSCOPIC APPENDECTOMY  11/26/2018   Procedure: APPENDECTOMY LAPAROSCOPIC;  Surgeon: Ward, Honor Loh, MD;  Location: [facility];  Service: Gynecology;;  . POLYPECTOMY  09/02/2015   Procedure: POLYPECTOMY;  Surgeon: [name], MD;  Location: [place];  Service: Endoscopy;;  . TONSILLECTOMY  1963    There were no vitals filed for this visit.  Subjective Assessment - 04/07/19 1302    Subjective  Patient has been busy at home w/ taxes. Patient has been somewhat forgetful on doing HEP    Pertinent History  CA history, radiation/chemo treatments, neuropathy into the feet    Limitations  Walking    How long can you walk comfortably?  Increased falls    Diagnostic tests  not performed    Patient Stated Goals  To decrease walking quality    Currently in Pain?  No/denies    Pain Score  0-No pain        TREATMENT   Neuromuscular reeducation Biphasic - patient was sitting and was told to actively contract anterior tibialis w/ the increase in intensity. Patient was assisted in  DF movement by therapist Time: 13 minutes Frequency: 35Hz  Phase duration: 600 usec Duty Cycle: 7/7  TA Eccentric toe drops 2 x 15 (pushing ankle into Max DF and ask patient to hold ankle up as long as possible) SLS w/ support 3 x 30 sec ea Standing hip abductions 2 x 5 w/ 10 sec holds at 25 degrees of Abduction (external cue and control at hips to prevent pelvic adduction) Standing Hip Extension x 10  Step ups x 10 ea  [place] Walking 3 x 53ft    Neuromuscular reeducation and therapeutic activities utilized to increase activation of the anterior tibialis and increase lower extremity w/ functional activities     PT Education - 04/07/19 1308    Education Details  form/technique w/ exercise    Person(s) Educated   Patient    Methods  Explanation;Demonstration;Tactile cues;Verbal cues    Comprehension  Verbalized understanding;Returned demonstration;Verbal cues required;Tactile cues required       PT Short Term Goals - 03/18/19 1708      PT SHORT TERM GOAL #1   Title  Patient will be independent with HEP to continue benefits of therapy after discharge.    Baseline  Dependent with form/technique    Time  6    Period  Weeks    Status  New    Target Date  04/29/19        PT Long Term Goals - 03/18/19 1708      PT LONG TERM GOAL #1   Title  Patient will improve 5xSTS to under 12 sec to decrease fall risk and ability to stand from low surfaces    Baseline  5XSTS: 25sec. 03/18/19: 15.4sec    Time  6    Period  Weeks    Status  On-going    Target Date  04/29/19      PT LONG TERM GOAL #2   Title  Patient will improve TUG to under 8 sec to indicate decrease fall risks most notably with transferring to amb.    Baseline  TUG: 12.5sec, 03/18/19: 12.46 seconds    Time  6    Period  Weeks    Status  On-going    Target Date  04/29/19      PT LONG TERM GOAL #3   Title  Patient will improve ability to perform 97mWT to over 1 m/s to indicate increased safety with community amb.    Baseline  .8 m/s, 03/18/19: .867m/s    Time  6    Period  Weeks    Status  On-going    Target Date  04/29/19      PT LONG TERM GOAL #4   Title  Patient will improve FGA to 27 points to indicate improvement with functional and dynamic gait with exercise.    Baseline  20/30 FGA, 03/18/19 FGA: 18 /30    Time  6    Period  Weeks    Status  On-going    Target Date  04/29/19      PT LONG TERM GOAL #5   Title  Patient will improve her FOTO score to 77 to indicate significant improvement with her balance.    Baseline  55 FOTO, 03/18/19: 66    Time  6    Period  Weeks    Status  On-going    Target Date  04/29/19              Patient will benefit from skilled therapeutic intervention in order to  improve the following  deficits and impairments:     Visit Diagnosis: No diagnosis found.     Problem List Patient Active Problem List   Diagnosis Date Noted  . Bilateral primary ovarian cancer ([place]) 02/02/2019  . Genetic testing 12/09/2018  . Status post hysterectomy 11/26/2018  . Need for prophylactic vaccination and inoculation against influenza 11/04/2018  . Anemia of chronic disease 10/22/2018  . B12 deficiency 10/22/2018  . Family history of lung cancer   . 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 Goals of care, counseling/discussion 08/11/2018  . 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 Loss of weight   . Benign neoplasm of descending colon   . Type 2 diabetes mellitus without complication, without long-term current use of insulin ([place]) 06/07/2015  . 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 Cyst of ovary 06/09/2014  . Bulky or enlarged uterus 06/09/2014  . Hypercholesteremia 02/09/2007  . Allergic rhinitis 11/27/2006  . Endometriosis 11/27/2006  . Adult hypothyroidism 11/27/2006    Gloriann Loan, SPT  04/07/2019, 1:16 PM  [facility] [address], [place], [address] Phone: [phone]   Fax:  [phone]  Name: Meredith Jones MRN: [id number] Date of Birth: October 13, 1955

## 2019-04-07 NOTE — Progress Notes (Signed)
Patient pre screened for office appointment, no questions or concerns today. Patient reminded of upcoming appointment time and date. 

## 2019-04-08 ENCOUNTER — Ambulatory Visit: Payer: Commercial Managed Care - PPO

## 2019-04-08 ENCOUNTER — Other Ambulatory Visit: Payer: Self-pay

## 2019-04-08 ENCOUNTER — Inpatient Hospital Stay: Payer: Commercial Managed Care - PPO | Attending: Obstetrics and Gynecology | Admitting: Obstetrics and Gynecology

## 2019-04-08 DIAGNOSIS — C569 Malignant neoplasm of unspecified ovary: Secondary | ICD-10-CM | POA: Diagnosis present

## 2019-04-08 DIAGNOSIS — Z9221 Personal history of antineoplastic chemotherapy: Secondary | ICD-10-CM

## 2019-04-08 DIAGNOSIS — Z90722 Acquired absence of ovaries, bilateral: Secondary | ICD-10-CM

## 2019-04-08 DIAGNOSIS — Z90710 Acquired absence of both cervix and uterus: Secondary | ICD-10-CM | POA: Diagnosis not present

## 2019-04-08 DIAGNOSIS — D696 Thrombocytopenia, unspecified: Secondary | ICD-10-CM | POA: Diagnosis not present

## 2019-04-08 DIAGNOSIS — E039 Hypothyroidism, unspecified: Secondary | ICD-10-CM | POA: Diagnosis not present

## 2019-04-08 DIAGNOSIS — C801 Malignant (primary) neoplasm, unspecified: Secondary | ICD-10-CM

## 2019-04-08 DIAGNOSIS — E119 Type 2 diabetes mellitus without complications: Secondary | ICD-10-CM | POA: Insufficient documentation

## 2019-04-08 NOTE — Progress Notes (Signed)
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 She presented with symptoms of bilateral lower extremity swelling as well as abdominal distention.GI. This was followed by a CT abdomen and chest imaging with contrast. CT showed cystic lesions of the enlarged bilateral ovaries the largest lesion on the left measuring 6.2 cm. Patient also noted to have numerous bulky retroperitoneal lymph nodes, largest left periaortic node or conglomerates near the superior mesenteric artery measuring at least 4.4 x 2.9 cm. Significant ascites was present. Patient underwent paracentesis and cytology from the fluid showed high-grade serous carcinoma. Tumor cells were positive for CK7, PAX 8, p53 and WT 1.  Patient started neoadjuvant carbotaxol and Avastin chemotherapy on 08/21/2018 and completed 4 cycles on 11/02/2018. Patient did not receive Avastin for cycle 4.  Pathology: DIAGNOSIS:  A. UTERUS AND CERVIX WITH BILATERAL FALLOPIAN TUBES AND OVARIES;  HYSTERECTOMY WITH BILATERAL SALPINGO-OOPHORECTOMY:  - HIGH-GRADE SEROUS ADENOCARCINOMA.  - REFER TO CANCER SUMMARY BELOW.   CANCER CASE SUMMARY: OVARY or FALLOPIAN TUBE or PRIMARY PERITONEUM  Procedure: Total hysterectomy with bilateral salpingo-oophorectomy  Specimen Integrity: Intact  Tumor Site: Bilateral ovaries and  fallopian tubes  Ovarian Surface Involvement: Present, bilateral  Fallopian Tube Surface Involvement: Present, left  Tumor Size: Greatest dimension 1.2 cm  Histologic Type: Serous carcinoma  Histologic Grade: High grade  Implants: N/A  Other Tissue/ Organ Involvement: Appendix, parametrium  Largest Extrapelvic Peritoneal Focus: Microscopic  Peritoneal/Ascitic Fluid: Positive for malignancy  Treatment Effect: Moderate response; see comment.  Regional Lymph Nodes: No nodes submitted or found  Pathologic Stage Classification (pTNM, AJCC 8th Edition): pT3a pNX pM1b  FIGO: IVB  TNM Descriptors: N/A   Comment:  Chemotherapy Response Score (CRS) is assigned based on evaluation of tumor response in the omentum, which is not submitted in this specimen. In the submitted tissue, there is a partial/moderate tumor response.   B. APPENDIX, APPENDECTOMY:  - TRANSMURAL INVOLVEMENT BY HIGH-GRADE SEROUS ADENOCARCINOMA.  Patient then completed 3 cycles of carbotaxol adjuvantly and is currently on maintenance mvasi  Interval history- right foot strength is improving. Appetite is good. Denies other complaints  ECOG PS- 1 Pain scale- 0 Opioid associated constipation- no  Review of systems- Review of Systems  Constitutional: Positive for malaise/fatigue. Negative for chills, fever and weight loss.  HENT: Negative for congestion, ear discharge and nosebleeds.   Eyes: Negative for blurred vision.  Respiratory: Negative for cough, hemoptysis, sputum production, shortness of breath and wheezing.   Cardiovascular: Negative for chest pain, palpitations, orthopnea and claudication.  Gastrointestinal: Negative for abdominal pain, blood in stool, constipation, diarrhea, heartburn, melena, nausea and vomiting.  Genitourinary: Negative for dysuria, flank pain, frequency, hematuria and urgency.  Musculoskeletal: Negative for back pain, joint pain and myalgias.  Skin: Negative for rash.  Neurological: Positive  for sensory change (peripheral neuropathy). Negative for dizziness, tingling, focal weakness, seizures, weakness and headaches.  Endo/Heme/Allergies: Does not bruise/bleed easily.  Psychiatric/Behavioral: Negative for depression and suicidal ideas. The patient  does not have insomnia.      Allergies  Allergen Reactions  . Clarithromycin Nausea Only  . Doxycycline Nausea Only    dizziness  . Penicillins Hives    Did it involve swelling of the face/tongue/throat, SOB, or low BP? No Did it involve sudden or severe rash/hives, skin peeling, or any reaction on the inside of your mouth or nose? No Did you need to seek medical attention at a hospital or doctor's office? No When did it last happen?50+ years ago If all above answers are "NO", may proceed with cephalosporin use.      Past Medical History:  Diagnosis Date  . Allergy   . Anemia   . Complication of anesthesia   . Diabetes mellitus without complication ([place])   . Diverticulosis   . Family history of adverse reaction to anesthesia    mom-n/v  . Family history of lung cancer   . GERD (gastroesophageal reflux disease)    h/o  . Hyperlipidemia   . Hypothyroidism   . Ovarian cancer ([place]) 08/04/2018   Chemo tx's.  . Personal history of chemotherapy   . PONV (postoperative nausea and vomiting)    sick with tonsillectomy  . 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 Thyroid disease      Past Surgical History:  Procedure Laterality Date  . ABDOMINAL HYSTERECTOMY    . BACK SURGERY  2007   Lumbar Surgery  . BREAST CYST ASPIRATION Right    Around 10 years ago. Pt thinks it was the right breast but not sure.  Marland Kitchen CATARACT EXTRACTION Left 2010   Right Eye 2011  . COLONOSCOPY WITH PROPOFOL N/A 09/02/2015   Procedure: COLONOSCOPY WITH PROPOFOL;  Surgeon: [name], MD;  Location: [facility];  Service: Endoscopy;  Laterality: N/A;  Diabetic - oral meds  . ESOPHAGOGASTRODUODENOSCOPY (EGD) WITH PROPOFOL N/A  11/03/2015   Procedure: ESOPHAGOGASTRODUODENOSCOPY (EGD) WITH PROPOFOL;  Surgeon: [name], MD;  Location: [facility];  Service: Endoscopy;  Laterality: N/A;  . EYE SURGERY Bilateral 06/2014    tear duck catherization ( Dr. Vallarie Mare)  . LAPAROSCOPIC APPENDECTOMY  11/26/2018   Procedure: APPENDECTOMY LAPAROSCOPIC;  Surgeon: Ward, Honor Loh, MD;  Location: [facility];  Service: Gynecology;;  . POLYPECTOMY  09/02/2015   Procedure: POLYPECTOMY;  Surgeon: [name], MD;  Location: [facility];  Service: Endoscopy;;  . TONSILLECTOMY  1963    Social History   Socioeconomic History  . Marital status: Single    Spouse name: Not on file  . Number of children: Not on file  . Years of education: Not on file  . Highest education level: Not on file  Occupational History  . Not on file  Tobacco Use  . Smoking status: Never Smoker  . Smokeless tobacco: Never Used  Substance and Sexual Activity  . Alcohol use: No  . Drug use: No  . Sexual activity: Not Currently  Other Topics Concern  . Not on file  Social History Narrative  . Not on file   Social Determinants of Health   Financial Resource Strain:   . Difficulty of Paying Living Expenses: Not on file  Food Insecurity:   . Worried About Charity fundraiser in the Last Year: Not on file  . Ran Out of Food in the Last Year: Not on file  Transportation Needs:   . Lack of Transportation (Medical): Not on file  . Lack of Transportation (Non-Medical): Not on file  Physical Activity:   .  Days of Exercise per Week: Not on file  . Minutes of Exercise per Session: Not on file  Stress:   . Feeling of Stress : Not on file  Social Connections:   . Frequency of Communication with Friends and Family: Not on file  . Frequency of Social Gatherings with Friends and Family: Not on file  . Attends Religious Services: Not on file  . Active Member of Clubs or Organizations: Not on file  . Attends Archivist Meetings: Not on  file  . Marital Status: Not on file  Intimate Partner Violence:   . Fear of Current or Ex-Partner: Not on file  . Emotionally Abused: Not on file  . Physically Abused: Not on file  . Sexually Abused: Not on file    Family History  Problem Relation Age of Onset  . Congestive Heart Failure Mother   . Diabetes Mother   . Stroke Mother   . Hypothyroidism Father   . Cancer Father        lung cancer  . Diabetes Father   . Cancer Paternal Grandmother        stomach  . Breast cancer Neg Hx      Current Outpatient Medications:  .  acetaminophen (TYLENOL) 500 MG tablet, Take 2 tablets (1,000 mg total) by mouth every 6 (six) hours., Disp: , Rfl:  .  aspirin 81 MG tablet, Take 81 mg by mouth daily. , Disp: , Rfl:  .  calcipotriene (DOVONOX) 0.005 % ointment, Apply 1 application topically as needed. CALCIPOTRIENE, 0.005% (External Ointment) - Historical Medication  apply to skin daily (0.005 %) Active, Disp: , Rfl:  .  dexamethasone (DECADRON) 4 MG tablet, Take 2 tablets (8 mg total) by mouth daily. Start the day after carboplatin chemotherapy for 3 days., Disp: 30 tablet, Rfl: 1 .  fexofenadine (ALLEGRA) 60 MG tablet, Take 60 mg by mouth daily as needed for allergies. , Disp: , Rfl:  .  furosemide (LASIX) 20 MG tablet, Take 1 tablet (20 mg total) by mouth daily., Disp: 20 tablet, Rfl: 0 .  gabapentin (NEURONTIN) 300 MG capsule, Take 1 capsule (300 mg total) by mouth at bedtime. Take 2 tablets at bedtime once and then 3 tablets for three nights at bedtime., Disp: 11 capsule, Rfl: 0 .  glipiZIDE (GLUCOTROL) 5 MG tablet, TAKE 1 TABLET BY MOUTH DAILY BEFORE BREAKFAST (Patient taking differently: Take 5 mg by mouth daily before breakfast. ), Disp: 90 tablet, Rfl: 1 .  glucose blood (ONE TOUCH ULTRA TEST) test strip, USE TO TEST BLOOD SUGAR TWICE A DAY AS INSTRUCTED, Disp: 100 each, Rfl: 12 .  insulin degludec (TRESIBA FLEXTOUCH) 100 UNIT/ML SOPN FlexTouch Pen, Inject 15 Units into the skin daily.,  Disp: , Rfl:  .  levothyroxine (SYNTHROID) 100 MCG tablet, Take 1 tablet (100 mcg total) by mouth daily., Disp: 90 tablet, Rfl: 1 .  LORazepam (ATIVAN) 0.5 MG tablet, Take 1 tablet (0.5 mg total) by mouth every 6 (six) hours as needed (Nausea or vomiting)., Disp: 30 tablet, Rfl: 0 .  metFORMIN (GLUCOPHAGE) 500 MG tablet, Take 2 tablets (1,000 mg total) by mouth 2 (two) times daily., Disp: 360 tablet, Rfl: 1 .  MULTIPLE VITAMIN PO, Take 1 tablet by mouth daily. , Disp: , Rfl:  .  ONETOUCH DELICA LANCETS 68T MISC, USE TO TEST BLOOD SUGAR ONCE A DAY, Disp: 100 each, Rfl: 12 .  simvastatin (ZOCOR) 20 MG tablet, Take 1 tablet (20 mg total) by mouth at bedtime.  Please schedule office visit before any future refills, Disp: 90 tablet, Rfl: 0 .  spironolactone (ALDACTONE) 50 MG tablet, Take 1 tablet (50 mg total) by mouth daily., Disp: 20 tablet, Rfl: 0 .  ibuprofen (ADVIL) 600 MG tablet, Take 1 tablet (600 mg total) by mouth every 6 (six) hours., Disp: 45 tablet, Rfl: 1 .  ondansetron (ZOFRAN) 8 MG tablet, Take 1 tablet (8 mg total) by mouth 2 (two) times daily as needed for refractory nausea / vomiting. Start on day 3 after carboplatin chemo., Disp: 30 tablet, Rfl: 1 .  oxyCODONE (ROXICODONE) 5 MG immediate release tablet, Take 1 tablet (5 mg total) by mouth every 8 (eight) hours as needed., Disp: 16 tablet, Rfl: 0 .  prochlorperazine (COMPAZINE) 10 MG tablet, Take 1 tablet (10 mg total) by mouth every 6 (six) hours as needed (Nausea or vomiting)., Disp: 30 tablet, Rfl: 1 No current facility-administered medications for this visit.  Facility-Administered Medications Ordered in Other Visits:  .  sodium chloride flush (NS) 0.9 % injection 10 mL, 10 mL, Intravenous, Once, Sindy Guadeloupe, MD .  sodium chloride flush (NS) 0.9 % injection 10 mL, 10 mL, Intravenous, Once, Randa Evens C, MD .  sodium chloride flush (NS) 0.9 % injection 10 mL, 10 mL, Intravenous, Once, Sindy Guadeloupe, MD  Physical exam:   Vitals:   04/06/19 0959  BP: 140/88  Pulse: 75  Temp: (!) 96.8 F (36 C)  TempSrc: Tympanic  Weight: 110 lb (49.9 kg)  Height: '5\' 4"'$  (1.626 m)   Physical Exam HENT:     Head: Normocephalic and atraumatic.  Eyes:     Pupils: Pupils are equal, round, and reactive to light.  Cardiovascular:     Rate and Rhythm: Normal rate and regular rhythm.     Heart sounds: Normal heart sounds.  Pulmonary:     Effort: Pulmonary effort is normal.     Breath sounds: Normal breath sounds.  Abdominal:     General: Bowel sounds are normal.     Palpations: Abdomen is soft.  Musculoskeletal:     Cervical back: Normal range of motion.     Comments: Strength in right dorsiflexor 4/5  Skin:    General: Skin is warm and dry.  Neurological:     Mental Status: She is alert and oriented to person, place, and time.      CMP Latest Ref Rng & Units 04/06/2019  Glucose 70 - 99 mg/dL 136(H)  BUN 8 - 23 mg/dL 15  Creatinine 0.44 - 1.00 mg/dL 0.55  Sodium 135 - 145 mmol/L 128(L)  Potassium 3.5 - 5.1 mmol/L 4.0  Chloride 98 - 111 mmol/L 89(L)  CO2 22 - 32 mmol/L 28  Calcium 8.9 - 10.3 mg/dL 9.7  Total Protein 6.5 - 8.1 g/dL 7.0  Total Bilirubin 0.3 - 1.2 mg/dL 0.4  Alkaline Phos 38 - 126 U/L 46  AST 15 - 41 U/L 27  ALT 0 - 44 U/L 29   CBC Latest Ref Rng & Units 04/06/2019  WBC 4.0 - 10.5 K/uL 4.3  Hemoglobin 12.0 - 15.0 g/dL 11.2(L)  Hematocrit 36.0 - 46.0 % 33.5(L)  Platelets 150 - 400 K/uL 136(L)      Assessment and plan- Patient is a 64 y.o. 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 Avastin was held for cycle 4. She is s/p robotic assisted total laparoscopic hysterectomy and bilateral salpingo-oophorectomy and appendectomy with  extensive lysis of adhesions and abdomen debulking.She has completed 6 cycles of carbotaxol chemotherapy ( 4neoadjuvantly and2adjuvantly). She is  here for treatment assessment prior to cycle 4 of maintenance Mvasi chemotherapy  Counts okay to proceed with cycle 4 of maintenance Mvasi chemotherapy today.  Blood pressure is well controlled and urine protein remains trace.  I will see her back in 3 weeks for cycle 5.  Chemo-induced peripheral neuropathy: Strength in the right dorsiflexor is improving.  Overall symptoms are stable with gabapentin  B12 deficiency: Continue oral B12Hemoglobin remaining stable around 11.  She also has hypothyroidism for which she sees endocrinology.   Visit Diagnosis 1. Serous adenocarcinoma ([place])   2. Encounter for monoclonal antibody treatment for malignancy   3. Chemotherapy-induced neuropathy ([place])   4. Macrocytic anemia   5. B12 deficiency      Dr. Randa Evens, MD, MPH [facility] at [facility] [phone] 04/08/2019 9:49 AM              '

## 2019-04-08 NOTE — Progress Notes (Signed)
Gynecologic Oncology Interval Visit   Referring Provider: Dr. 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 [place] female, diagnosed with high grade serous adenocarcinoma, s/p 4 cycles of carbo-taxol-bevacizumab (bevacizumab held for cycle 4), followed by RA-TLH-BSO with appendectomy, enterolysis, ovariolysis, and R1 optimal debulking, followed by 2 cycles of carbo-taxol, currently on maintenance Mvasi, who returns to clinic today for continued surveillance.   She was last seen for post-op. In the interim, she received 2 cycles of adjuvant carbo-taxol with mvasi/bevacizumab biosimilar added for cycle 2, currently on maintenance mvasi. Cycle 3 was held due to foot drop and peripheral neuropathy. She is now followed by Rosalyn Gess, PT. This is much improved and she is no longer using a walker.   CA 125  02/02/2019 9.9 03/16/2019 11.8  Gynecologic Oncology History:  Meredith Jones is a pleasant [place] female, diagnosed with high grade serous adenocarcinoma.   She presented to ER on 07/23/2018 with symptoms of bilateral lower extremity swelling as well as abdominal distention. US abdomen revealed large volume ascites, no overt changes of hepatic cirrhosis and was referred to Dr. Tama Headings. She underwent US paracentesis on 07/29/2018. 5L of fluid was removed.   Pathology:  A. ABDOMINAL FLUID; PARACENTESIS:  - MALIGNANT CELLS PRESENT.  - ADENOCARCINOMA, MOST CONSISTENT WITH HIGH-GRADE SEROUS CARCINOMA.   08/04/2018- CT C/A/P IMPRESSION: 1. There are cystic lesions of the enlarged bilateral ovaries, largest lesion on the left measuring at least 6.2 cm (series 2, image 116, series 6, image 76). Findings are highly concerning for ovarian neoplasm, particularly given presence of otherwise unexplained ascites and abdominal lymphadenopathy.  2. There are numerous bulky retroperitoneal lymph nodes, largest left periaortic nodes  or conglomerates near the superior mesenteric artery measuring at least 4.4 x 2.9 cm (series 2, image 64), concerning for metastatic disease, or alternately primary malignancy such as lymphoma.  She saw Dr. Janese Banks on 08/08/2018. Noted to have macrocytic anemia and found to have low B12 levels. TSH- 46.46; FT3 1.1, FT4 0.74. She has seen her endocrinologist and adjusted her levothyroxine. History of type 2 diabetes. Last hA1c was 6.5 (08/04/2018). CA-125 (08/08/2018) = 491.   She opted for neoadjuvant chemotherapy.  CT C/A/P- 10/16/2018-  1.Demonstrates a marked positive response to therapy with regression of bilateral ovarian lesions and resolution of previously noted retroperitoneal lymphadenopathy. Additionally, previously noted ascites has resolved. 2. No findings to suggest metastatic disease in the thorax. 3. Aortic atherosclerosis, in addition to left anterior descending coronary artery disease. Please note that although the presence of coronary artery calcium documents the presence of coronary artery disease, the severity of this disease and any potential stenosis cannot be assessed on this non-gated CT examination. Assessment for potential risk factor modification, dietary therapy or pharmacologic therapy may be warranted, if clinically indicated.  She received cycle 4 of carbo-Taxol on 10/23/2018.  She received 5 doses of Venofer (10/23/2018-11/06/2018) and 4 doses of vitamin B12 (10/23/2018-11/06/2018) for anemia.  On 11/26/2018 she underwent robotic assisted total laparoscopic hysterectomy and bilateral salpingo-oophorectomy, appendectomy, extensive lysis of adhesions including enterolysis and ovariolysis (>45 minutes), and R1 optimal debulking.  Repair of vaginal laceration.  There was miliary disease 1-5 mm, including a small amount to on the transverse colon, scattered throughout the peritoneal and mesenteric surfaces.  Postoperative course was uncomplicated and she was discharged on postop day 1.  She  saw Dr. Theora Gianotti on 12/10/2018 for evaluation and review of pathology.  Pelvic  was deferred due to close approximation to surgery.  She declined vaginal bleeding which was felt to be expected given procedure vaginal laceration repair.  Pathology: DIAGNOSIS:  A. UTERUS AND CERVIX WITH BILATERAL FALLOPIAN TUBES AND OVARIES;  HYSTERECTOMY WITH BILATERAL SALPINGO-OOPHORECTOMY:  - HIGH-GRADE SEROUS ADENOCARCINOMA.  - REFER TO CANCER SUMMARY BELOW.   CANCER CASE SUMMARY: OVARY or FALLOPIAN TUBE or PRIMARY PERITONEUM  Procedure: Total hysterectomy with bilateral salpingo-oophorectomy  Specimen Integrity: Intact  Tumor Site: Bilateral ovaries and fallopian tubes  Ovarian Surface Involvement: Present, bilateral  Fallopian Tube Surface Involvement: Present, left  Tumor Size: Greatest dimension 1.2 cm  Histologic Type: Serous carcinoma  Histologic Grade: High grade  Implants: N/A  Other Tissue/ Organ Involvement: Appendix, parametrium  Largest Extrapelvic Peritoneal Focus: Microscopic  Peritoneal/Ascitic Fluid: Positive for malignancy  Treatment Effect: Moderate response; see comment.  Regional Lymph Nodes: No nodes submitted or found  Pathologic Stage Classification (pTNM, AJCC 8th Edition): pT3a pNX pM1b  FIGO: IVB  TNM Descriptors: N/A   Comment:  Chemotherapy Response Score (CRS) is assigned based on evaluation of tumor response in the omentum, which is not submitted in this specimen. In the submitted tissue, there is a partial/moderate tumor response.   B. APPENDIX, APPENDECTOMY:  - TRANSMURAL INVOLVEMENT BY HIGH-GRADE SEROUS ADENOCARCINOMA.  CA 125 has been followed:  08/08/2018 491.0 09/11/2018 200 10/02/2018 78.5 10/23/2018 31.2 01/12/2019 12.2  Genetic Testing: MyRisk testing negative for APC, ATM, AXIN2, BARD1, BMPR1A, BRIP1, BRCA1, BRCA2, CDH1, CDK4, CDKN2A, CHEK2, EPCAM (large rearramgement only), HOXB13 (sequencing only), GALNT12, MLH1, MSH2, MSH3 (excluding repetitive portions  of exon1) MSH6, MUTYH, NBN, NTHL1, PALB2, PMS2, PTEN, RAD51C, RAD51D, RNF43, SMAD4, STK11, TP53. Sequencing for select regions of POLE and POLD1, and large rearrangement analysis for select regions of GREM1 were negative.  Tumor testing: negative for  BRCA1, BRCA2; GIS negative  Problem List: Patient Active Problem List   Diagnosis Date Noted  . Bilateral primary ovarian cancer ([place]) 02/02/2019  . Genetic testing 12/09/2018  . Status post hysterectomy 11/26/2018  . Need for prophylactic vaccination and inoculation against influenza 11/04/2018  . Anemia of chronic disease 10/22/2018  . B12 deficiency 10/22/2018  . Family history of lung cancer   . Serous adenocarcinoma ([place]) 08/13/2018  . Goals of care, counseling/discussion 08/11/2018  . Primary high grade serous adenocarcinoma of ovary ([place]) 08/11/2018  . Loss of weight   . Benign neoplasm of descending colon   . Type 2 diabetes mellitus without complication, without long-term current use of insulin ([place]) 06/07/2015  . 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 Cyst of ovary 06/09/2014  . Bulky or enlarged uterus 06/09/2014  . Hypercholesteremia 02/09/2007  . Allergic rhinitis 11/27/2006  . Endometriosis 11/27/2006  . Adult hypothyroidism 11/27/2006    Past Medical History: Past Medical History:  Diagnosis Date  . Allergy   . Anemia   . Complication of anesthesia   . Diabetes mellitus without complication ([place])   . Diverticulosis   . Family history of adverse reaction to anesthesia    mom-n/v  . Family history of lung cancer   . GERD (gastroesophageal reflux disease)    h/o  . Hyperlipidemia   . Hypothyroidism   . Ovarian cancer ([place]) 08/04/2018   Chemo tx's.  . Personal history of chemotherapy   . PONV (postoperative nausea and vomiting)    sick with tonsillectomy  . 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 Thyroid disease    Past Surgical History: Past Surgical History:  Procedure Laterality  Date  . ABDOMINAL  HYSTERECTOMY    . BACK SURGERY  2007   Lumbar Surgery  . BREAST CYST ASPIRATION Right    Around 10 years ago. Pt thinks it was the right breast but not sure.  Marland Kitchen CATARACT EXTRACTION Left 2010   Right Eye 2011  . COLONOSCOPY WITH PROPOFOL N/A 09/02/2015   Procedure: COLONOSCOPY WITH PROPOFOL;  Surgeon: [name], MD;  Location: [facility];  Service: Endoscopy;  Laterality: N/A;  Diabetic - oral meds  . ESOPHAGOGASTRODUODENOSCOPY (EGD) WITH PROPOFOL N/A 11/03/2015   Procedure: ESOPHAGOGASTRODUODENOSCOPY (EGD) WITH PROPOFOL;  Surgeon: [name], MD;  Location: [facility];  Service: Endoscopy;  Laterality: N/A;  . EYE SURGERY Bilateral 06/2014    tear duck catherization ( Dr. Vallarie Mare)  . LAPAROSCOPIC APPENDECTOMY  11/26/2018   Procedure: APPENDECTOMY LAPAROSCOPIC;  Surgeon: Ward, Honor Loh, MD;  Location: [facility];  Service: Gynecology;;  . POLYPECTOMY  09/02/2015   Procedure: POLYPECTOMY;  Surgeon: [name], MD;  Location: [facility];  Service: Endoscopy;;  . TONSILLECTOMY  1963   Past Gynecologic History:  Per HPI  OB History: P0 OB History  No obstetric history on file.   Family History: Family History  Problem Relation Age of Onset  . Congestive Heart Failure Mother   . Diabetes Mother   . Stroke Mother   . Hypothyroidism Father   . Cancer Father        lung cancer  . Diabetes Father   . Cancer Paternal Grandmother        stomach  . Breast cancer Neg Hx     Social History: Social History   Socioeconomic History  . Marital status: Single    Spouse name: Not on file  . Number of children: Not on file  . Years of education: Not on file  . Highest education level: Not on file  Occupational History  . Not on file  Tobacco Use  . Smoking status: Never Smoker  . Smokeless tobacco: Never Used  Substance and Sexual Activity  . Alcohol use: No  . Drug use: No  . Sexual activity: Not Currently  Other Topics Concern  . Not on file   Social History Narrative  . Not on file   Social Determinants of Health   Financial Resource Strain:   . Difficulty of Paying Living Expenses: Not on file  Food Insecurity:   . Worried About Charity fundraiser in the Last Year: Not on file  . Ran Out of Food in the Last Year: Not on file  Transportation Needs:   . Lack of Transportation (Medical): Not on file  . Lack of Transportation (Non-Medical): Not on file  Physical Activity:   . Days of Exercise per Week: Not on file  . Minutes of Exercise per Session: Not on file  Stress:   . Feeling of Stress : Not on file  Social Connections:   . Frequency of Communication with Friends and Family: Not on file  . Frequency of Social Gatherings with Friends and Family: Not on file  . Attends Religious Services: Not on file  . Active Member of Clubs or Organizations: Not on file  . Attends Archivist Meetings: Not on file  . Marital Status: Not on file    Allergies: Allergies  Allergen Reactions  . Clarithromycin Nausea Only  . Doxycycline Nausea Only    dizziness  . Penicillins Hives    Did it involve swelling of the face/tongue/throat, SOB,  or low BP? No Did it involve sudden or severe rash/hives, skin peeling, or any reaction on the inside of your mouth or nose? No Did you need to seek medical attention at a hospital or doctor's office? No When did it last happen?50+ years ago If all above answers are "NO", may proceed with cephalosporin use.     Current Medications: Current Outpatient Medications on File Prior to Visit  Medication Sig Dispense Refill  . acetaminophen (TYLENOL) 500 MG tablet Take 2 tablets (1,000 mg total) by mouth every 6 (six) hours.    Marland Kitchen aspirin 81 MG tablet Take 81 mg by mouth daily.     . calcipotriene (DOVONOX) 0.005 % ointment Apply 1 application topically as needed. CALCIPOTRIENE, 0.005% (External Ointment) - Historical Medication  apply to skin daily (0.005 %) Active    .  dexamethasone (DECADRON) 4 MG tablet Take 2 tablets (8 mg total) by mouth daily. Start the day after carboplatin chemotherapy for 3 days. 30 tablet 1  . fexofenadine (ALLEGRA) 60 MG tablet Take 60 mg by mouth daily as needed for allergies.     . furosemide (LASIX) 20 MG tablet Take 1 tablet (20 mg total) by mouth daily. 20 tablet 0  . gabapentin (NEURONTIN) 300 MG capsule Take 1 capsule (300 mg total) by mouth at bedtime. Take 2 tablets at bedtime once and then 3 tablets for three nights at bedtime. 11 capsule 0  . glipiZIDE (GLUCOTROL) 5 MG tablet TAKE 1 TABLET BY MOUTH DAILY BEFORE BREAKFAST (Patient taking differently: Take 5 mg by mouth daily before breakfast. ) 90 tablet 1  . glucose blood (ONE TOUCH ULTRA TEST) test strip USE TO TEST BLOOD SUGAR TWICE A DAY AS INSTRUCTED 100 each 12  . ibuprofen (ADVIL) 600 MG tablet Take 1 tablet (600 mg total) by mouth every 6 (six) hours. 45 tablet 1  . insulin degludec (TRESIBA FLEXTOUCH) 100 UNIT/ML SOPN FlexTouch Pen Inject 15 Units into the skin daily.    Marland Kitchen levothyroxine (SYNTHROID) 100 MCG tablet Take 1 tablet (100 mcg total) by mouth daily. 90 tablet 1  . LORazepam (ATIVAN) 0.5 MG tablet Take 1 tablet (0.5 mg total) by mouth every 6 (six) hours as needed (Nausea or vomiting). 30 tablet 0  . metFORMIN (GLUCOPHAGE) 500 MG tablet Take 2 tablets (1,000 mg total) by mouth 2 (two) times daily. 360 tablet 1  . MULTIPLE VITAMIN PO Take 1 tablet by mouth daily.     . ondansetron (ZOFRAN) 8 MG tablet Take 1 tablet (8 mg total) by mouth 2 (two) times daily as needed for refractory nausea / vomiting. Start on day 3 after carboplatin chemo. 30 tablet 1  . ONETOUCH DELICA LANCETS 13Y MISC USE TO TEST BLOOD SUGAR ONCE A DAY 100 each 12  . oxyCODONE (ROXICODONE) 5 MG immediate release tablet Take 1 tablet (5 mg total) by mouth every 8 (eight) hours as needed. 16 tablet 0  . prochlorperazine (COMPAZINE) 10 MG tablet Take 1 tablet (10 mg total) by mouth every 6 (six)  hours as needed (Nausea or vomiting). 30 tablet 1  . simvastatin (ZOCOR) 20 MG tablet Take 1 tablet (20 mg total) by mouth at bedtime. Please schedule office visit before any future refills 90 tablet 0  . spironolactone (ALDACTONE) 50 MG tablet Take 1 tablet (50 mg total) by mouth daily. 20 tablet 0   Current Facility-Administered Medications on File Prior to Visit  Medication Dose Route Frequency Provider Last Rate Last Admin  . sodium  chloride flush (NS) 0.9 % injection 10 mL  10 mL Intravenous Once Sindy Guadeloupe, MD      . sodium chloride flush (NS) 0.9 % injection 10 mL  10 mL Intravenous Once Sindy Guadeloupe, MD      . sodium chloride flush (NS) 0.9 % injection 10 mL  10 mL Intravenous Once Sindy Guadeloupe, MD       Review of Systems General:  no complaints Skin: no complaints Eyes: no complaints HEENT: no complaints Breasts: no complaints Pulmonary: no complaints Cardiac: no complaints Gastrointestinal: no complaints Genitourinary/Sexual: no complaints Hematology: no complaints Neurologic/Psych: no complaints   Objective:  Physical Examination:  Today's Vitals   04/07/19 1016  PainSc: 0-No pain   There is no height or weight on file to calculate BMI.  Vitals with BMI 04/06/2019 03/16/2019 02/23/2019  Height '5\' 4"'$  - -  Weight 110 lbs 107 lbs 10 oz -  BMI 70.62 - -  Systolic 376 283 151  Diastolic 88 74 82  Pulse 75 72 76    Performance status: 1  GENERAL: Patient is in no acute distress. Thin build.  HEENT:  Sclera clear. Anicteric NODES:  Negative axillary, supraclavicular, inguinal lymph node survery LUNGS:  Clear to auscultation bilaterally.   HEART:  Regular rate and rhythm.  ABDOMEN:  Soft, nontender.  No hernias, incisions well healed. No masses or ascites EXTREMITIES:  No peripheral edema. Atraumatic. No cyanosis. Wearing brace on right foot for right foot drop SKIN:  Clear with no obvious rashes or skin changes.  NEURO:  Nonfocal. Well oriented.  Appropriate  affect.  Pelvic: Exam chaperoned by nursing:  EGBUS: normal Vagina: no lesions.  Bimanual/RV: no masses  Lab Review Lab Results  Component Value Date   WBC 4.3 04/06/2019   HGB 11.2 (L) 04/06/2019   HCT 33.5 (L) 04/06/2019   MCV 105.7 (H) 04/06/2019   PLT 136 (L) 04/06/2019        Chemistry      Component Value Date/Time   NA 128 (L) 04/06/2019 0937   NA 142 05/31/2017 0836   K 4.0 04/06/2019 0937   CL 89 (L) 04/06/2019 0937   CO2 28 04/06/2019 0937   BUN 15 04/06/2019 0937   BUN 9 05/31/2017 0836   CREATININE 0.55 04/06/2019 0937   CREATININE 0.57 11/13/2016 0933   GLU 89 03/25/2013 0000      Component Value Date/Time   CALCIUM 9.7 04/06/2019 0937   ALKPHOS 46 04/06/2019 0937   AST 27 04/06/2019 0937   ALT 29 04/06/2019 0937   BILITOT 0.4 04/06/2019 0937   BILITOT <0.2 05/31/2017 0836     Lab Results  Component Value Date   TSH 1.899 11/12/2018     Ref Range & Units 4wk ago  Free T4 0.61 - 1.12 ng/dL 1.33High      Ref Range & Units 4wk ago  T3, Free 2.0 - 4.4 pg/mL 1.4Low        Assessment:  Meredith Jones is a 64 y.o. 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 Bulky retroperitoneal adenopathy adjacent and surrounding superior mesenteric artery s/p neoadjuvant chemotherapy with 3 cycles of carboplatin-paclitaxel-bevacizumab following by cycle #4 with chemotherapy only with excellent response based on CA125, imaging and exam. On 11/26/2018 she underwent robotic assisted total laparoscopic hysterectomy, bilateral salpingo-oophorectomy, appendectomy, extensive lysis of adhesions including enterolysis and ovariolysis (>45 minutes), and R1 optimal debulking. Completed 2 additional  cycles of chemotherapy with mvasi/bev biosimilar. Last chemotherapy treatment held due to right foot drop.  This has improved and she is no longer using a walker.  Continuing on maintenance bevacizumab  biosimilar.    Mild anemia/thrombocytopenia. History of hypoalbuminemia, improved.     Abnormal TFTs followed by Dr. Gabriel Carina, Endocrinology.   Medical co-morbidities complicating care: Diabetes mellitus without complication ([place]), Hyperlipidemia, Hypothyroid disease. Body mass index is 18.95 kg/m.  Plan:  She will continue bevacizumab maintenance since no BRCA mutation or HRD. Her energy and functional status continue to improve.    We can see her back in about 3-4 months.   Beckey Rutter, DNP, AGNP-C [place] [facility] [phone] (clinic)  I personally interviewed and examined the patient for 25 minutes. Agreed with the above/below plan of care. I have directly contributed to assessment and plan of care of this patient and educated and discussed with patient and family.  Mellody Drown, MD  CC:  Dr. Janese Banks

## 2019-04-09 ENCOUNTER — Ambulatory Visit: Payer: Commercial Managed Care - PPO

## 2019-04-09 DIAGNOSIS — M21371 Foot drop, right foot: Secondary | ICD-10-CM

## 2019-04-09 DIAGNOSIS — R262 Difficulty in walking, not elsewhere classified: Secondary | ICD-10-CM

## 2019-04-09 NOTE — Therapy (Signed)
[facility] [facility] [address] [place], [address] Phone: [phone]   Fax:  [phone]  Physical Therapy Treatment/Progress Note  Patient Details  Name: Meredith Jones MRN: [id number] Date of Birth: [birth year] Referring Provider (PT): Janese Banks MD  Reporting Period: 03/18/2019 - 04/09/2019  Encounter Date: 04/09/2019  PT End of Session - 04/09/19 1341    Visit Number  14    Number of Visits  21    Date for PT Re-Evaluation  03/31/19    Authorization Type  5/10    PT Start Time  1300    PT Stop Time  1345    PT Time Calculation (min)  45 min    Equipment Utilized During Treatment  Gait belt    Activity Tolerance  Patient tolerated treatment well    Behavior During Therapy  [facility] for tasks assessed/performed       Past Medical History:  Diagnosis Date  . Allergy   . Anemia   . Complication of anesthesia   . Diabetes mellitus without complication ([place])   . Diverticulosis   . Family history of adverse reaction to anesthesia    mom-n/v  . Family history of lung cancer   . GERD (gastroesophageal reflux disease)    h/o  . Hyperlipidemia   . Hypothyroidism   . Ovarian cancer ([place]) 08/04/2018   Chemo tx's.  . Personal history of chemotherapy   . PONV (postoperative nausea and vomiting)    sick with tonsillectomy  . 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 Thyroid disease     Past Surgical History:  Procedure Laterality Date  . ABDOMINAL HYSTERECTOMY    . BACK SURGERY  2007   Lumbar Surgery  . BREAST CYST ASPIRATION Right    Around 10 years ago. Pt thinks it was the right breast but not sure.  Marland Kitchen CATARACT EXTRACTION Left 2010   Right Eye 2011  . COLONOSCOPY WITH PROPOFOL N/A 09/02/2015   Procedure: COLONOSCOPY WITH PROPOFOL;  Surgeon: [name], MD;  Location: [place];  Service: Endoscopy;  Laterality: N/A;  Diabetic - oral meds  . ESOPHAGOGASTRODUODENOSCOPY (EGD) WITH PROPOFOL N/A  11/03/2015   Procedure: ESOPHAGOGASTRODUODENOSCOPY (EGD) WITH PROPOFOL;  Surgeon: [name], MD;  Location: [place];  Service: Endoscopy;  Laterality: N/A;  . EYE SURGERY Bilateral 06/2014    tear duck catherization ( Dr. Vallarie Mare)  . LAPAROSCOPIC APPENDECTOMY  11/26/2018   Procedure: APPENDECTOMY LAPAROSCOPIC;  Surgeon: Ward, Honor Loh, MD;  Location: [facility];  Service: Gynecology;;  . POLYPECTOMY  09/02/2015   Procedure: POLYPECTOMY;  Surgeon: [name], MD;  Location: [place];  Service: Endoscopy;;  . TONSILLECTOMY  1963    There were no vitals filed for this visit.  Subjective Assessment - 04/09/19 1355    Subjective  Patient is doing well today with no complaints or issues. States that she notices an improvement in how she is doing    Pertinent History  CA history, radiation/chemo treatments, neuropathy into the feet    Limitations  Walking    How long can you walk comfortably?  Increased falls    Diagnostic tests  not performed    Patient Stated Goals  To decrease walking quality    Currently in Pain?  No/denies        TREATMENT   Neuromuscular reeducation Biphasic - patient was sitting and was told to actively contract anterior tibialis w/ the increase in intensity. Patient  was assisted in DF movement by therapist Time: 17minutes Frequency:20Hz  Phase duration:600 usec Duty Cycle: 7/7  Neuromuscular reeducation utilized to increase muscular strength of anterior tibialis to reduce fall risk   TA TUG - 14.26sec 5x STS - 19.36 sec (1st trial) 22.02sec (2nd trial)  10MWT - 11.45 = 0.62m/s gait speed  FGA - 20/30   Therapeutic activities utilized to assess patient's gait performance for progress note   PT Education - 04/09/19 1356    Education Details  form/technique with gait assessment    Person(s) Educated  Patient    Methods  Explanation;Demonstration;Tactile cues;Verbal cues    Comprehension  Verbalized understanding;Returned  demonstration;Verbal cues required;Tactile cues required       PT Short Term Goals - 03/18/19 1708      PT SHORT TERM GOAL #1   Title  Patient will be independent with HEP to continue benefits of therapy after discharge.    Baseline  Dependent with form/technique    Time  6    Period  Weeks    Status  New    Target Date  04/29/19        PT Long Term Goals - 04/09/19 1412      PT LONG TERM GOAL #1   Title  Patient will improve 5xSTS to under 12 sec to decrease fall risk and ability to stand from low surfaces    Baseline  5XSTS: 25sec. 03/18/19: 15.4sec; 04/07/2019: 16.5sec. 04/09/2019: 19.38 sec    Time  6    Period  Weeks    Status  On-going    Target Date  05/07/19      PT LONG TERM GOAL #2   Title  Patient will improve TUG to under 8 sec to indicate decrease fall risks most notably with transferring to amb.    Baseline  TUG: 12.5sec, 03/18/19: 12.46 seconds; 04/07/2019: Deferred. 04/09/2019: 14.26 sec    Time  6    Period  Weeks    Status  On-going    Target Date  05/07/19      PT LONG TERM GOAL #3   Title  Patient will improve ability to perform 79mWT to over 1 m/s to indicate increased safety with community amb.    Baseline  .8 m/s, 03/18/19: .875m/s; 04/07/2019: Deferred. 04/09/2019: 0.51m/s    Time  6    Period  Weeks    Status  On-going    Target Date  05/07/19      PT LONG TERM GOAL #4   Title  Patient will improve FGA to 27 points to indicate improvement with functional and dynamic gait with exercise.    Baseline  20/30 FGA, 03/18/19 FGA: 18 /30; 2/23/20221: Deferred. 04/09/2019: 20/30    Time  6    Period  Weeks    Status  On-going    Target Date  05/07/19      PT LONG TERM GOAL #5   Title  Patient will improve her FOTO score to 77 to indicate significant improvement with her balance.    Baseline  55 FOTO, 03/18/19: 66; 04/07/2019: Defered. 04/09/2019: 58    Time  6    Period  Weeks    Status  New    Target Date  05/07/19      PT LONG TERM GOAL #6   Title  Patient  will be able to achieve a 4/5 for DF MMT in order for her to ambulate around the home with decreased risk of falling  Baseline  04/09/19: 2-/5 MMT for DF;    Time  6    Period  Weeks    Status  New       Patient will benefit from skilled therapeutic intervention in order to improve the following deficits and impairments:  Decreased coordination, Decreased mobility, Decreased endurance, Decreased range of motion, Decreased activity tolerance, Decreased strength, Decreased balance, Difficulty walking, Pain, Abnormal gait  Visit Diagnosis: Foot drop, right  Difficulty in walking, not elsewhere classified     Problem List Patient Active Problem List   Diagnosis Date Noted  . Bilateral primary ovarian cancer ([place]) 02/02/2019  . Genetic testing 12/09/2018  . Status post hysterectomy 11/26/2018  . Need for prophylactic vaccination and inoculation against influenza 11/04/2018  . Anemia of chronic disease 10/22/2018  . B12 deficiency 10/22/2018  . Family history of lung cancer   . 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 Goals of care, counseling/discussion 08/11/2018  . 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 Loss of weight   . Benign neoplasm of descending colon   . Type 2 diabetes mellitus without complication, without long-term current use of insulin ([place]) 06/07/2015  . 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 Cyst of ovary 06/09/2014  . Bulky or enlarged uterus 06/09/2014  . Hypercholesteremia 02/09/2007  . Allergic rhinitis 11/27/2006  . Endometriosis 11/27/2006  . Adult hypothyroidism 11/27/2006    Blythe Stanford, PT DPT 04/09/2019, 5:24 PM  [place] [facility] [address] [place], [address] Phone: [phone]   Fax:  [phone]  Name: Meredith Jones MRN: [id number] Date of Birth: 1955-08-21

## 2019-04-13 ENCOUNTER — Other Ambulatory Visit: Payer: Self-pay

## 2019-04-13 ENCOUNTER — Inpatient Hospital Stay: Payer: Commercial Managed Care - PPO | Attending: Oncology

## 2019-04-13 ENCOUNTER — Ambulatory Visit: Payer: Commercial Managed Care - PPO | Attending: Oncology

## 2019-04-13 DIAGNOSIS — G620 Drug-induced polyneuropathy: Secondary | ICD-10-CM | POA: Insufficient documentation

## 2019-04-13 DIAGNOSIS — R262 Difficulty in walking, not elsewhere classified: Secondary | ICD-10-CM | POA: Diagnosis present

## 2019-04-13 DIAGNOSIS — M21371 Foot drop, right foot: Secondary | ICD-10-CM

## 2019-04-13 DIAGNOSIS — Z5112 Encounter for antineoplastic immunotherapy: Secondary | ICD-10-CM | POA: Insufficient documentation

## 2019-04-13 DIAGNOSIS — C577 Malignant neoplasm of other specified female genital organs: Secondary | ICD-10-CM | POA: Insufficient documentation

## 2019-04-13 NOTE — Therapy (Signed)
[place] [facility] [address] [place], [address] Phone: [phone]   Fax:  [phone]  Physical Therapy Treatment  Patient Details  Name: Meredith Jones MRN: [id number] Date of Birth: 1956-01-26 Referring Provider (PT): Janese Banks MD   Encounter Date: 04/13/2019  PT End of Session - 04/13/19 1318    Visit Number  15    Number of Visits  21    Date for PT Re-Evaluation  05/05/19    Authorization Type  1/10    PT Start Time  1300    PT Stop Time  1345    PT Time Calculation (min)  45 min    Equipment Utilized During Treatment  Gait belt    Activity Tolerance  Patient tolerated treatment well    Behavior During Therapy  [facility] for tasks assessed/performed       Past Medical History:  Diagnosis Date  . Allergy   . Anemia   . Complication of anesthesia   . Diabetes mellitus without complication ([place])   . Diverticulosis   . Family history of adverse reaction to anesthesia    mom-n/v  . Family history of lung cancer   . GERD (gastroesophageal reflux disease)    h/o  . Hyperlipidemia   . Hypothyroidism   . Ovarian cancer ([place]) 08/04/2018   Chemo tx's.  . Personal history of chemotherapy   . PONV (postoperative nausea and vomiting)    sick with tonsillectomy  . 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 Thyroid disease     Past Surgical History:  Procedure Laterality Date  . ABDOMINAL HYSTERECTOMY    . BACK SURGERY  2007   Lumbar Surgery  . BREAST CYST ASPIRATION Right    Around 10 years ago. Pt thinks it was the right breast but not sure.  Marland Kitchen CATARACT EXTRACTION Left 2010   Right Eye 2011  . COLONOSCOPY WITH PROPOFOL N/A 09/02/2015   Procedure: COLONOSCOPY WITH PROPOFOL;  Surgeon: [name], MD;  Location: [place];  Service: Endoscopy;  Laterality: N/A;  Diabetic - oral meds  . ESOPHAGOGASTRODUODENOSCOPY (EGD) WITH PROPOFOL N/A 11/03/2015   Procedure: ESOPHAGOGASTRODUODENOSCOPY (EGD) WITH  PROPOFOL;  Surgeon: [name], MD;  Location: [place];  Service: Endoscopy;  Laterality: N/A;  . EYE SURGERY Bilateral 06/2014    tear duck catherization ( Dr. Vallarie Mare)  . LAPAROSCOPIC APPENDECTOMY  11/26/2018   Procedure: APPENDECTOMY LAPAROSCOPIC;  Surgeon: Ward, Honor Loh, MD;  Location: [facility];  Service: Gynecology;;  . POLYPECTOMY  09/02/2015   Procedure: POLYPECTOMY;  Surgeon: [name], MD;  Location: [place];  Service: Endoscopy;;  . TONSILLECTOMY  1963    There were no vitals filed for this visit.  Subjective Assessment - 04/13/19 1316    Subjective  Patient reports she feels less wobbly than she had previously. Patient states improvement overall with function and states she is scheduled to get an AFO this week.    Pertinent History  CA history, radiation/chemo treatments, neuropathy into the feet    Limitations  Walking    How long can you walk comfortably?  Increased falls    Diagnostic tests  not performed    Patient Stated Goals  To decrease walking quality    Currently in Pain?  No/denies       TREATMENT  Neuromuscular reeducation Biphasic - patient was sitting and was told to actively contract anterior tibialis w/ the increase in intensity. Patient was assisted  in DF movement by therapist Time: 10 minutes Frequency: 30Hz  Phase duration: 460 usec Duty Cycle: 4/4 Tandem amb on airex beam to improve gait instability - x4 down and back with CGA to mina  Side stepping on airex beam with mina-CGA to improve - x 4 down and back Neuromuscular reeducation utilized to increase muscular strength of anterior tibialis to reduce fall risk Gait training Addressed heel strike with amb - 219ft with cueing to improve gait progression. Step over hurdles in standing - 7 x 6 hurdles in standing step over step to improve balance and strength Ambulation with focus on improving heel strike with holding two 5# weights in stands - x221ft to improve balance and  increase strength       PT Education - 04/13/19 1317    Education Details  form/technique with exercise; steppage with gait    Person(s) Educated  Patient    Methods  Explanation;Demonstration    Comprehension  Verbalized understanding;Returned demonstration       PT Short Term Goals - 03/18/19 1708      PT SHORT TERM GOAL #1   Title  Patient will be independent with HEP to continue benefits of therapy after discharge.    Baseline  Dependent with form/technique    Time  6    Period  Weeks    Status  New    Target Date  04/29/19        PT Long Term Goals - 04/09/19 1412      PT LONG TERM GOAL #1   Title  Patient will improve 5xSTS to under 12 sec to decrease fall risk and ability to stand from low surfaces    Baseline  5XSTS: 25sec. 03/18/19: 15.4sec; 04/07/2019: 16.5sec. 04/09/2019: 19.38 sec    Time  6    Period  Weeks    Status  On-going    Target Date  05/07/19      PT LONG TERM GOAL #2   Title  Patient will improve TUG to under 8 sec to indicate decrease fall risks most notably with transferring to amb.    Baseline  TUG: 12.5sec, 03/18/19: 12.46 seconds; 04/07/2019: Deferred. 04/09/2019: 14.26 sec    Time  6    Period  Weeks    Status  On-going    Target Date  05/07/19      PT LONG TERM GOAL #3   Title  Patient will improve ability to perform 61mWT to over 1 m/s to indicate increased safety with community amb.    Baseline  .8 m/s, 03/18/19: .879m/s; 04/07/2019: Deferred. 04/09/2019: 0.61m/s    Time  6    Period  Weeks    Status  On-going    Target Date  05/07/19      PT LONG TERM GOAL #4   Title  Patient will improve FGA to 27 points to indicate improvement with functional and dynamic gait with exercise.    Baseline  20/30 FGA, 03/18/19 FGA: 18 /30; 2/23/20221: Deferred. 04/09/2019: 20/30    Time  6    Period  Weeks    Status  On-going    Target Date  05/07/19      PT LONG TERM GOAL #5   Title  Patient will improve her FOTO score to 77 to indicate significant  improvement with her balance.    Baseline  55 FOTO, 03/18/19: 66; 04/07/2019: Defered. 04/09/2019: 58    Time  6    Period  Weeks    Status  New    Target Date  05/07/19      PT LONG TERM GOAL #6   Title  Patient will be able to achieve a 4/5 for DF MMT in order for her to ambulate around the home with decreased risk of falling    Baseline  04/09/19: 2-/5 MMT for DF;    Time  6    Period  Weeks    Status  New            Plan - 04/13/19 1355    Clinical Impression Statement  Patient demonstrates decreased heel strike with ambulation with is then improved with verbal cueing and performing education to increase heel strike. Patient with marked improvement in tibialis anterior activation with ability to achieve 2+/5 versus 1/5 at eval. Patient is improving overall and will benefit from further skilled therapy to return to prior level of function.    Personal Factors and Comorbidities  Age;Comorbidity 3+;Past/Current Experience;Other    Comorbidities  hx of CA, neuropathy, drop foot    Examination-Activity Limitations  Carry;Stairs;Stand;Bend    Examination-Participation Restrictions  Cleaning;Community Activity;Yard Work    [employer]  Evolving/Moderate complexity    Rehab Potential  Fair    PT Frequency  2x / week    PT Duration  6 weeks    PT Treatment/Interventions  Balance training;Neuromuscular re-education;Functional mobility training;Therapeutic activities;Gait training;Stair training;Electrical Stimulation;Manual techniques;Energy conservation;Patient/family education;Therapeutic exercise    PT Next Visit Plan  Hip strength, dyanmic balance, Squats, low functioning glute med exercises    PT Home Exercise Plan  Sit to stands; SLS; hip abductor strength    Consulted and Agree with Plan of Care  Patient       Patient will benefit from skilled therapeutic intervention in order to improve the following deficits and impairments:  Decreased coordination,  Decreased mobility, Decreased endurance, Decreased range of motion, Decreased activity tolerance, Decreased strength, Decreased balance, Difficulty walking, Pain, Abnormal gait  Visit Diagnosis: Foot drop, right  Difficulty in walking, not elsewhere classified     Problem List Patient Active Problem List   Diagnosis Date Noted  . Bilateral primary ovarian cancer ([place]) 02/02/2019  . Genetic testing 12/09/2018  . Status post hysterectomy 11/26/2018  . Need for prophylactic vaccination and inoculation against influenza 11/04/2018  . Anemia of chronic disease 10/22/2018  . B12 deficiency 10/22/2018  . Family history of lung cancer   . 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 Goals of care, counseling/discussion 08/11/2018  . 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 Loss of weight   . Benign neoplasm of descending colon   . Type 2 diabetes mellitus without complication, without long-term current use of insulin ([place]) 06/07/2015  . 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 Cyst of ovary 06/09/2014  . Bulky or enlarged uterus 06/09/2014  . Hypercholesteremia 02/09/2007  . Allergic rhinitis 11/27/2006  . Endometriosis 11/27/2006  . Adult hypothyroidism 11/27/2006    Blythe Stanford, PT DPT 04/13/2019, 1:57 PM  [facility] [facility] [address] [place], [address] Phone: [phone]   Fax:  [phone]  Name: EVERLINE FLOW MRN: [id number] Date of Birth: 02-02-1956

## 2019-04-15 ENCOUNTER — Inpatient Hospital Stay: Payer: Commercial Managed Care - PPO

## 2019-04-15 ENCOUNTER — Other Ambulatory Visit: Payer: Self-pay

## 2019-04-15 ENCOUNTER — Ambulatory Visit: Payer: Commercial Managed Care - PPO

## 2019-04-15 DIAGNOSIS — M21371 Foot drop, right foot: Secondary | ICD-10-CM | POA: Diagnosis not present

## 2019-04-15 DIAGNOSIS — R262 Difficulty in walking, not elsewhere classified: Secondary | ICD-10-CM

## 2019-04-15 NOTE — Therapy (Signed)
[facility] [address], [place], [address] Phone: [phone]   Fax:  [phone]  Physical Therapy Treatment  Patient Details  Name: Meredith Jones MRN: [id number] Date of Birth: Feb 11, 1956 Referring Provider (PT): Janese Banks MD   Encounter Date: 04/15/2019  PT End of Session - 04/15/19 1310    Visit Number  16    Number of Visits  21    Date for PT Re-Evaluation  05/05/19    Authorization Type  2/10    PT Start Time  1300    PT Stop Time  1345    PT Time Calculation (min)  45 min    Equipment Utilized During Treatment  Gait belt    Activity Tolerance  Patient tolerated treatment well    Behavior During Therapy  [facility] for tasks assessed/performed       Past Medical History:  Diagnosis Date  . Allergy   . Anemia   . Complication of anesthesia   . Diabetes mellitus without complication ([place])   . Diverticulosis   . Family history of adverse reaction to anesthesia    mom-n/v  . Family history of lung cancer   . GERD (gastroesophageal reflux disease)    h/o  . Hyperlipidemia   . Hypothyroidism   . Ovarian cancer ([place]) 08/04/2018   Chemo tx's.  . Personal history of chemotherapy   . PONV (postoperative nausea and vomiting)    sick with tonsillectomy  . 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 Thyroid disease     Past Surgical History:  Procedure Laterality Date  . ABDOMINAL HYSTERECTOMY    . BACK SURGERY  2007   Lumbar Surgery  . BREAST CYST ASPIRATION Right    Around 10 years ago. Pt thinks it was the right breast but not sure.  Marland Kitchen CATARACT EXTRACTION Left 2010   Right Eye 2011  . COLONOSCOPY WITH PROPOFOL N/A 09/02/2015   Procedure: COLONOSCOPY WITH PROPOFOL;  Surgeon: [name], MD;  Location: [facility];  Service: Endoscopy;  Laterality: N/A;  Diabetic - oral meds  . ESOPHAGOGASTRODUODENOSCOPY (EGD) WITH PROPOFOL N/A 11/03/2015   Procedure: ESOPHAGOGASTRODUODENOSCOPY (EGD) WITH  PROPOFOL;  Surgeon: [name], MD;  Location: [facility];  Service: Endoscopy;  Laterality: N/A;  . EYE SURGERY Bilateral 06/2014    tear duck catherization ( Dr. Vallarie Mare)  . LAPAROSCOPIC APPENDECTOMY  11/26/2018   Procedure: APPENDECTOMY LAPAROSCOPIC;  Surgeon: Ward, Honor Loh, MD;  Location: [facility];  Service: Gynecology;;  . POLYPECTOMY  09/02/2015   Procedure: POLYPECTOMY;  Surgeon: [name], MD;  Location: [facility];  Service: Endoscopy;;  . TONSILLECTOMY  1963    There were no vitals filed for this visit.  Subjective Assessment - 04/15/19 1309    Subjective  Patient reports she was able to drive the other day which she supports was a positive experience and did not feel unsafe. Patient states she is going to get an AFO.    Pertinent History  CA history, radiation/chemo treatments, neuropathy into the feet    Limitations  Walking    How long can you walk comfortably?  Increased falls    Diagnostic tests  not performed    Patient Stated Goals  To decrease walking quality    Currently in Pain?  No/denies       TREATMENT   Neuromuscular reeducation Biphasic - patient was sitting and was told to actively contract anterior tibialis w/ the increase  in intensity. Patient was assisted in DF movement by therapist Time: 10 minutes Frequency: 30Hz  Phase duration: 460 usec Duty Cycle: 7/7  Therapeutic Activation Sit to stand - x 10; x 10 with holding 10#, x 10 with holding 10# Lunges in standing - 2 x 12 with UE support  Tandem amb on airex beam to improve gait instability - x4 down and back with CGA to mina  Side stepping on airex beam with mina-CGA to improve - x 4 down and back Walking over balance stones - 6 x 6 with minA Neuromuscular reeducation utilized to increase muscular strength of anterior tibialis to reduce fall risk  Ambulation with focus on improving heel strike with holding two 5# weights in stands - x236ft to improve balance and increase  strength   PT Education - 04/15/19 1310    Education Details  form/technique with exercise    Person(s) Educated  Patient    Methods  Explanation;Demonstration    Comprehension  Verbalized understanding;Returned demonstration       PT Short Term Goals - 03/18/19 1708      PT SHORT TERM GOAL #1   Title  Patient will be independent with HEP to continue benefits of therapy after discharge.    Baseline  Dependent with form/technique    Time  6    Period  Weeks    Status  New    Target Date  04/29/19        PT Long Term Goals - 04/09/19 1412      PT LONG TERM GOAL #1   Title  Patient will improve 5xSTS to under 12 sec to decrease fall risk and ability to stand from low surfaces    Baseline  5XSTS: 25sec. 03/18/19: 15.4sec; 04/07/2019: 16.5sec. 04/09/2019: 19.38 sec    Time  6    Period  Weeks    Status  On-going    Target Date  05/07/19      PT LONG TERM GOAL #2   Title  Patient will improve TUG to under 8 sec to indicate decrease fall risks most notably with transferring to amb.    Baseline  TUG: 12.5sec, 03/18/19: 12.46 seconds; 04/07/2019: Deferred. 04/09/2019: 14.26 sec    Time  6    Period  Weeks    Status  On-going    Target Date  05/07/19      PT LONG TERM GOAL #3   Title  Patient will improve ability to perform 45mWT to over 1 m/s to indicate increased safety with community amb.    Baseline  .8 m/s, 03/18/19: .880m/s; 04/07/2019: Deferred. 04/09/2019: 0.4m/s    Time  6    Period  Weeks    Status  On-going    Target Date  05/07/19      PT LONG TERM GOAL #4   Title  Patient will improve FGA to 27 points to indicate improvement with functional and dynamic gait with exercise.    Baseline  20/30 FGA, 03/18/19 FGA: 18 /30; 2/23/20221: Deferred. 04/09/2019: 20/30    Time  6    Period  Weeks    Status  On-going    Target Date  05/07/19      PT LONG TERM GOAL #5   Title  Patient will improve her FOTO score to 77 to indicate significant improvement with her balance.    Baseline   55 FOTO, 03/18/19: 66; 04/07/2019: Defered. 04/09/2019: 58    Time  6    Period  Weeks  Status  New    Target Date  05/07/19      PT LONG TERM GOAL #6   Title  Patient will be able to achieve a 4/5 for DF MMT in order for her to ambulate around the home with decreased risk of falling    Baseline  04/09/19: 2-/5 MMT for DF;    Time  6    Period  Weeks    Status  New            Plan - 04/15/19 1315    Clinical Impression Statement  Good progress with tibialis anterior activation, patient almost able to acheive full AROM without assistance. Patient also able to tolerate intermittent SLS onto compiant surfaces, however requires the switching of feet often to maintain balance. Patient able to tolerate higher level exercises without significant compensations indicating further carryover. Although patient is improving she continues to have increased balance dificulties with ambulation. Patient will benefit from further skilled therapy to return to prior level of function.    Personal Factors and Comorbidities  Age;Comorbidity 3+;Past/Current Experience;Other    Comorbidities  hx of CA, neuropathy, drop foot    Examination-Activity Limitations  Carry;Stairs;Stand;Bend    Examination-Participation Restrictions  Cleaning;Community Activity;Yard Work    [employer]chant [employer] officer  Evolving/Moderate complexity    Rehab Potential  Fair    PT Frequency  2x / week    PT Duration  6 weeks    PT Treatment/Interventions  Balance training;Neuromuscular re-education;Functional mobility training;Therapeutic activities;Gait training;Stair training;Electrical Stimulation;Manual techniques;Energy conservation;Patient/family education;Therapeutic exercise    PT Next Visit Plan  Hip strength, dyanmic balance, Squats, low functioning glute med exercises    PT Home Exercise Plan  Sit to stands; SLS; hip abductor strength    Consulted and Agree with Plan of Care  Patient       Patient will benefit from  skilled therapeutic intervention in order to improve the following deficits and impairments:  Decreased coordination, Decreased mobility, Decreased endurance, Decreased range of motion, Decreased activity tolerance, Decreased strength, Decreased balance, Difficulty walking, Pain, Abnormal gait  Visit Diagnosis: Foot drop, right  Difficulty in walking, not elsewhere classified     Problem List Patient Active Problem List   Diagnosis Date Noted  . Bilateral primary ovarian cancer ([place]) 02/02/2019  . Genetic testing 12/09/2018  . Status post hysterectomy 11/26/2018  . Need for prophylactic vaccination and inoculation against influenza 11/04/2018  . Anemia of chronic disease 10/22/2018  . B12 deficiency 10/22/2018  . Family history of lung cancer   . Serous adenocarcinoma ([place]) 08/13/2018  . Goals of care, counseling/discussion 08/11/2018  . 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 Loss of weight   . Benign neoplasm of descending colon   . Type 2 diabetes mellitus without complication, without long-term current use of insulin ([place]) 06/07/2015  . 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 Cyst of ovary 06/09/2014  . Bulky or enlarged uterus 06/09/2014  . Hypercholesteremia 02/09/2007  . Allergic rhinitis 11/27/2006  . Endometriosis 11/27/2006  . Adult hypothyroidism 11/27/2006    Blythe Stanford, PT DPT 04/15/2019, 1:48 PM  [facility] [address] [place], [address] Phone: [phone]   Fax:  [phone]  Name: DELYNDA SHECKLER MRN: [id number] Date of Birth: Dec 03, 1955

## 2019-04-16 ENCOUNTER — Inpatient Hospital Stay: Payer: Commercial Managed Care - PPO

## 2019-04-16 NOTE — Progress Notes (Signed)
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  Patient receiving adjuvant chemotherapy.    Spoke with patient via phone.  Patient reports that appetite is good.  She is eating 3 meals per day plus 1 snack. Reports that she is including good sources of protein like, cheese, nuts, yogurt, peanut butter.  This am ate grilled cheese sandwich and cup of yogurt. Also just finished a large bowel of salad with cheese, salsa and lowfat mayo with crackers.   Drinks 1 boost glucose control shake daily usually in the am. Does not want to drink more due to causing increased blood glucose. Snack is usually pretzels with cheese or peanut butter and 3 hard candies.        Medications: reviewed  Labs: reviewed  Anthropometrics:   Weight increased to 110 lb from 107 lb on 1/28   NUTRITION DIAGNOSIS: Inadequate oral intake improved.    INTERVENTION:  Encouraged patient to continue with current eating pattern.  Encouraged using regular full fat foods (ie regular mayo vs lowfat) for more calories to help increase weight.   Patient has contact information and encouraged to call RD if needed in the future   NEXT VISIT: no follow-up Patient to call if needed  Claudeen Leason B. Zenia Resides, [place], [place] Registered Dietitian [phone] (pager)

## 2019-04-20 ENCOUNTER — Ambulatory Visit: Payer: Commercial Managed Care - PPO

## 2019-04-20 ENCOUNTER — Other Ambulatory Visit: Payer: Self-pay

## 2019-04-20 DIAGNOSIS — M21371 Foot drop, right foot: Secondary | ICD-10-CM

## 2019-04-20 DIAGNOSIS — R262 Difficulty in walking, not elsewhere classified: Secondary | ICD-10-CM

## 2019-04-20 NOTE — Therapy (Signed)
[facility] [address] [place], [address] Phone: [phone]   Fax:  [phone]  Physical Therapy Treatment  Patient Details  Name: Meredith Jones MRN: [id number] Date of Birth: January 18, 1956 Referring Provider (PT): Janese Banks MD   Encounter Date: 04/20/2019  PT End of Session - 04/20/19 1020    Visit Number  17    Number of Visits  21    Date for PT Re-Evaluation  05/05/19    Authorization Type  3/10    PT Start Time  [id number]    PT Stop Time  1030    PT Time Calculation (min)  43 min    Equipment Utilized During Treatment  Gait belt    Activity Tolerance  Patient tolerated treatment well    Behavior During Therapy  [facility] for tasks assessed/performed       Past Medical History:  Diagnosis Date  . Allergy   . Anemia   . Complication of anesthesia   . Diabetes mellitus without complication ([place])   . Diverticulosis   . Family history of adverse reaction to anesthesia    mom-n/v  . Family history of lung cancer   . GERD (gastroesophageal reflux disease)    h/o  . Hyperlipidemia   . Hypothyroidism   . Ovarian cancer ([place]) 08/04/2018   Chemo tx's.  . Personal history of chemotherapy   . PONV (postoperative nausea and vomiting)    sick with tonsillectomy  . 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 Thyroid disease     Past Surgical History:  Procedure Laterality Date  . ABDOMINAL HYSTERECTOMY    . BACK SURGERY  2007   Lumbar Surgery  . BREAST CYST ASPIRATION Right    Around 10 years ago. Pt thinks it was the right breast but not sure.  Marland Kitchen CATARACT EXTRACTION Left 2010   Right Eye 2011  . COLONOSCOPY WITH PROPOFOL N/A 09/02/2015   Procedure: COLONOSCOPY WITH PROPOFOL;  Surgeon: [name], MD;  Location: [facility];  Service: Endoscopy;  Laterality: N/A;  Diabetic - oral meds  . ESOPHAGOGASTRODUODENOSCOPY (EGD) WITH PROPOFOL N/A 11/03/2015   Procedure: ESOPHAGOGASTRODUODENOSCOPY (EGD) WITH  PROPOFOL;  Surgeon: [name], MD;  Location: [facility];  Service: Endoscopy;  Laterality: N/A;  . EYE SURGERY Bilateral 06/2014    tear duck catherization ( Dr. Vallarie Mare)  . LAPAROSCOPIC APPENDECTOMY  11/26/2018   Procedure: APPENDECTOMY LAPAROSCOPIC;  Surgeon: Ward, Honor Loh, MD;  Location: [facility];  Service: Gynecology;;  . POLYPECTOMY  09/02/2015   Procedure: POLYPECTOMY;  Surgeon: [name], MD;  Location: [facility];  Service: Endoscopy;;  . TONSILLECTOMY  1963    There were no vitals filed for this visit.  Subjective Assessment - 04/20/19 1003    Subjective  Patient states she has been feeling good recently. Patient reports she went to get measured for an AFO last Thursday.    Pertinent History  CA history, radiation/chemo treatments, neuropathy into the feet    Limitations  Walking    How long can you walk comfortably?  Increased falls    Diagnostic tests  not performed    Patient Stated Goals  To decrease walking quality    Currently in Pain?  No/denies       TREATMENT   Neuromuscular reeducation Biphasic - patient was sitting and was told to actively contract anterior tibialis w/ the increase in intensity. Patient was assisted in DF movement by therapist Time:  10 minutes Frequency: 20Hz  Phase duration: 460 usec Duty Cycle: 4/4   Sit to stand - x 10 with focus on speed ; x 10 with holding 10#,   Walking step over step over small hurdles - x 10  Walking over hurdles with airex beam - x 10  Side stepping over hurdles - x 4 with airex beam  Cone taps from airex beam (3 cones stacked) - x 10 Neuromuscular reeducation utilized to increase muscular strength of anterior tibialis to reduce fall risk     PT Education - 04/20/19 1020    Education Details  form/technique with exercise    Person(s) Educated  Patient    Methods  Explanation;Demonstration    Comprehension  Verbalized understanding;Returned demonstration       PT Short Term Goals -  03/18/19 1708      PT SHORT TERM GOAL #1   Title  Patient will be independent with HEP to continue benefits of therapy after discharge.    Baseline  Dependent with form/technique    Time  6    Period  Weeks    Status  New    Target Date  04/29/19        PT Long Term Goals - 04/09/19 1412      PT LONG TERM GOAL #1   Title  Patient will improve 5xSTS to under 12 sec to decrease fall risk and ability to stand from low surfaces    Baseline  5XSTS: 25sec. 03/18/19: 15.4sec; 04/07/2019: 16.5sec. 04/09/2019: 19.38 sec    Time  6    Period  Weeks    Status  On-going    Target Date  05/07/19      PT LONG TERM GOAL #2   Title  Patient will improve TUG to under 8 sec to indicate decrease fall risks most notably with transferring to amb.    Baseline  TUG: 12.5sec, 03/18/19: 12.46 seconds; 04/07/2019: Deferred. 04/09/2019: 14.26 sec    Time  6    Period  Weeks    Status  On-going    Target Date  05/07/19      PT LONG TERM GOAL #3   Title  Patient will improve ability to perform 33mWT to over 1 m/s to indicate increased safety with community amb.    Baseline  .8 m/s, 03/18/19: .855m/s; 04/07/2019: Deferred. 04/09/2019: 0.47m/s    Time  6    Period  Weeks    Status  On-going    Target Date  05/07/19      PT LONG TERM GOAL #4   Title  Patient will improve FGA to 27 points to indicate improvement with functional and dynamic gait with exercise.    Baseline  20/30 FGA, 03/18/19 FGA: 18 /30; 2/23/20221: Deferred. 04/09/2019: 20/30    Time  6    Period  Weeks    Status  On-going    Target Date  05/07/19      PT LONG TERM GOAL #5   Title  Patient will improve her FOTO score to 77 to indicate significant improvement with her balance.    Baseline  55 FOTO, 03/18/19: 66; 04/07/2019: Defered. 04/09/2019: 58    Time  6    Period  Weeks    Status  New    Target Date  05/07/19      PT LONG TERM GOAL #6   Title  Patient will be able to achieve a 4/5 for DF MMT in order for her to ambulate around  the home with  decreased risk of falling    Baseline  04/09/19: 2-/5 MMT for DF;    Time  6    Period  Weeks    Status  New            Plan - 04/20/19 1027    Clinical Impression Statement  Patient requires CGA to minA to maintain balance with use of airex pad for balancing indicating decreased dynamic control and possible hip limitations in term of coordination Continued to progress standing and squatting motions to improve functional LE strength. Patient demonstrates greater ability to perform sit to stands with less use of UE support compared to previous sessions, but still performs motion slowly. Patient will benefit from furhter skilled therapy focused on improving overall strength and balance to return to prior level of function.    Personal Factors and Comorbidities  Age;Comorbidity 3+;Past/Current Experience;Other    Comorbidities  hx of CA, neuropathy, drop foot    Examination-Activity Limitations  Carry;Stairs;Stand;Bend    Examination-Participation Restrictions  Cleaning;Community Activity;Yard Work    [employer] officer  Evolving/Moderate complexity    Rehab Potential  Fair    PT Frequency  2x / week    PT Duration  6 weeks    PT Treatment/Interventions  Balance training;Neuromuscular re-education;Functional mobility training;Therapeutic activities;Gait training;Stair training;Electrical Stimulation;Manual techniques;Energy conservation;Patient/family education;Therapeutic exercise    PT Next Visit Plan  Hip strength, dyanmic balance, Squats, low functioning glute med exercises    PT Home Exercise Plan  Sit to stands; SLS; hip abductor strength    Consulted and Agree with Plan of Care  Patient       Patient will benefit from skilled therapeutic intervention in order to improve the following deficits and impairments:  Decreased coordination, Decreased mobility, Decreased endurance, Decreased range of motion, Decreased activity tolerance, Decreased strength, Decreased balance,  Difficulty walking, Pain, Abnormal gait  Visit Diagnosis: Difficulty in walking, not elsewhere classified  Foot drop, right     Problem List Patient Active Problem List   Diagnosis Date Noted  . Bilateral primary ovarian cancer ([place]) 02/02/2019  . Genetic testing 12/09/2018  . Status post hysterectomy 11/26/2018  . Need for prophylactic vaccination and inoculation against influenza 11/04/2018  . Anemia of chronic disease 10/22/2018  . B12 deficiency 10/22/2018  . Family history of lung cancer   . Serous adenocarcinoma ([place]) 08/13/2018  . Goals of care, counseling/discussion 08/11/2018  . 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 Loss of weight   . Benign neoplasm of descending colon   . Type 2 diabetes mellitus without complication, without long-term current use of insulin ([place]) 06/07/2015  . 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 Cyst of ovary 06/09/2014  . Bulky or enlarged uterus 06/09/2014  . Hypercholesteremia 02/09/2007  . Allergic rhinitis 11/27/2006  . Endometriosis 11/27/2006  . Adult hypothyroidism 11/27/2006    Blythe Stanford, PT DPT 04/20/2019, 10:36 AM  [facility] [address], [place], [address] Phone: [phone]   Fax:  [phone]  Name: YASMINE SEGLER MRN: [id number] Date of Birth: 29-May-1955

## 2019-04-22 ENCOUNTER — Inpatient Hospital Stay (HOSPITAL_BASED_OUTPATIENT_CLINIC_OR_DEPARTMENT_OTHER): Payer: Commercial Managed Care - PPO | Admitting: Hospice and Palliative Medicine

## 2019-04-22 DIAGNOSIS — Z515 Encounter for palliative care: Secondary | ICD-10-CM | POA: Diagnosis not present

## 2019-04-22 DIAGNOSIS — C801 Malignant (primary) neoplasm, unspecified: Secondary | ICD-10-CM

## 2019-04-22 NOTE — Progress Notes (Signed)
Virtual Visit via Telephone Note  I connected with Meredith Jones on 04/22/19 at  9:30 AM EST by telephone and verified that I am speaking with the correct person using two identifiers.   I discussed the limitations, risks, security and privacy concerns of performing an evaluation and management service by telephone and the availability of in person appointments. I also discussed with the patient that there may be a patient responsible charge related to this service. The patient expressed understanding and agreed to proceed.   History of Present Illness: Palliative Care consult requested for this 64 y.o. 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  Abdominal CT revealed cystic lesions in the bilateral ovaries with numerous bulky retroperitoneal lymph nodes and a large amount of ascites.  She has required therapeutic paracentesis.  Patient is s/p recent TAH and BSO on 11/26/2018. She is now receiving adjuvant systemic chemotherapy.  Patient was referred to palliative care to help address goals and manage ongoing symptoms.   Observations/Objective: I called and spoke with patient by phone.  She reports gradual improvement in performance status.  She still working with physical therapy.  She has a leg brace.  She denies falls.  She reports independence with her self-care.  No significant changes or concerns were reported today by patient.  Assessment and Plan: Stage III serous adenocarcinoma the ovary -carboplatin/paclitaxel.  Followed by Dr. Janese Banks.  Goals of care -are aligned with continued treatment.  Patient has opted to remain a full code  Follow Up Instructions: Virtual visit 1 to 2 months   I discussed the assessment and treatment plan with the patient. The patient was provided an opportunity to ask questions and all were answered. The patient agreed with the plan and demonstrated an understanding of the instructions.   The patient  was advised to call back or seek an in-person evaluation if the symptoms worsen or if the condition fails to improve as anticipated.  I provided 5 minutes of non-face-to-face time during this encounter.   Irean Hong, NP

## 2019-04-27 ENCOUNTER — Encounter: Payer: Self-pay | Admitting: Oncology

## 2019-04-27 ENCOUNTER — Inpatient Hospital Stay: Payer: Commercial Managed Care - PPO

## 2019-04-27 ENCOUNTER — Inpatient Hospital Stay (HOSPITAL_BASED_OUTPATIENT_CLINIC_OR_DEPARTMENT_OTHER): Payer: Commercial Managed Care - PPO | Admitting: Oncology

## 2019-04-27 VITALS — BP 126/76 | HR 76 | Temp 96.6°F | Resp 18 | Wt 107.3 lb

## 2019-04-27 DIAGNOSIS — T451X5A Adverse effect of antineoplastic and immunosuppressive drugs, initial encounter: Secondary | ICD-10-CM

## 2019-04-27 DIAGNOSIS — C801 Malignant (primary) neoplasm, unspecified: Secondary | ICD-10-CM

## 2019-04-27 DIAGNOSIS — C561 Malignant neoplasm of right ovary: Secondary | ICD-10-CM

## 2019-04-27 DIAGNOSIS — C563 Malignant neoplasm of bilateral ovaries: Secondary | ICD-10-CM

## 2019-04-27 DIAGNOSIS — G620 Drug-induced polyneuropathy: Secondary | ICD-10-CM | POA: Diagnosis not present

## 2019-04-27 DIAGNOSIS — Z5112 Encounter for antineoplastic immunotherapy: Secondary | ICD-10-CM

## 2019-04-27 DIAGNOSIS — C577 Malignant neoplasm of other specified female genital organs: Secondary | ICD-10-CM | POA: Diagnosis present

## 2019-04-27 LAB — CBC WITH DIFFERENTIAL/PLATELET
Abs Immature Granulocytes: 0.02 10*3/uL (ref 0.00–0.07)
Basophils Absolute: 0.1 10*3/uL (ref 0.0–0.1)
Basophils Relative: 2 %
Eosinophils Absolute: 1.2 10*3/uL — ABNORMAL HIGH (ref 0.0–0.5)
Eosinophils Relative: 22 %
HCT: 35 % — ABNORMAL LOW (ref 36.0–46.0)
Hemoglobin: 11.9 g/dL — ABNORMAL LOW (ref 12.0–15.0)
Immature Granulocytes: 0 %
Lymphocytes Relative: 15 %
Lymphs Abs: 0.8 10*3/uL (ref 0.7–4.0)
MCH: 35.8 pg — ABNORMAL HIGH (ref 26.0–34.0)
MCHC: 34 g/dL (ref 30.0–36.0)
MCV: 105.4 fL — ABNORMAL HIGH (ref 80.0–100.0)
Monocytes Absolute: 0.4 10*3/uL (ref 0.1–1.0)
Monocytes Relative: 7 %
Neutro Abs: 3 10*3/uL (ref 1.7–7.7)
Neutrophils Relative %: 54 %
Platelets: 139 10*3/uL — ABNORMAL LOW (ref 150–400)
RBC: 3.32 MIL/uL — ABNORMAL LOW (ref 3.87–5.11)
RDW: 13.6 % (ref 11.5–15.5)
WBC: 5.5 10*3/uL (ref 4.0–10.5)
nRBC: 0 % (ref 0.0–0.2)

## 2019-04-27 LAB — COMPREHENSIVE METABOLIC PANEL
ALT: 38 U/L (ref 0–44)
AST: 32 U/L (ref 15–41)
Albumin: 3.6 g/dL (ref 3.5–5.0)
Alkaline Phosphatase: 51 U/L (ref 38–126)
Anion gap: 6 (ref 5–15)
BUN: 18 mg/dL (ref 8–23)
CO2: 29 mmol/L (ref 22–32)
Calcium: 9.5 mg/dL (ref 8.9–10.3)
Chloride: 97 mmol/L — ABNORMAL LOW (ref 98–111)
Creatinine, Ser: 0.63 mg/dL (ref 0.44–1.00)
GFR calc Af Amer: >60 mL/min (ref >60)
GFR calc non Af Amer: >60 mL/min (ref >60)
Glucose, Bld: 212 mg/dL — ABNORMAL HIGH (ref 70–99)
Potassium: 4.2 mmol/L (ref 3.5–5.1)
Sodium: 132 mmol/L — ABNORMAL LOW (ref 135–145)
Total Bilirubin: 0.5 mg/dL (ref 0.3–1.2)
Total Protein: 7.3 g/dL (ref 6.5–8.1)

## 2019-04-27 LAB — PROTEIN, URINE, RANDOM: Total Protein, Urine: 23 mg/dL

## 2019-04-27 MED ORDER — SODIUM CHLORIDE 0.9 % IV SOLN
Freq: Once | INTRAVENOUS | Status: AC
  Filled 2019-04-27: qty 250

## 2019-04-27 MED ORDER — SODIUM CHLORIDE 0.9 % IV SOLN
15.0000 mg/kg | Freq: Once | INTRAVENOUS | Status: AC
  Administered 2019-04-27: 800 mg via INTRAVENOUS
  Filled 2019-04-27: qty 32

## 2019-04-28 LAB — CA 125: Cancer Antigen (CA) 125: 50.6 U/mL — ABNORMAL HIGH (ref 0.0–38.1)

## 2019-04-28 NOTE — Progress Notes (Signed)
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 She presented with symptoms of bilateral lower extremity swelling as well as abdominal distention.GI. This was followed by a CT abdomen and chest imaging with contrast. CT showed cystic lesions of the enlarged bilateral ovaries the largest lesion on the left measuring 6.2 cm. Patient also noted to have numerous bulky retroperitoneal lymph nodes, largest left periaortic node or conglomerates near the superior mesenteric artery measuring at least 4.4 x 2.9 cm. Significant ascites was present. Patient underwent paracentesis and cytology from the fluid showed high-grade serous carcinoma. Tumor cells were positive for CK7, PAX 8, p53 and WT 1.  Patient started neoadjuvant carbotaxol and Avastin chemotherapy on 08/21/2018 and completed 4 cycles on 11/02/2018. Patient did not receive Avastin for cycle 4.  Pathology: DIAGNOSIS:  A. UTERUS AND CERVIX WITH BILATERAL FALLOPIAN TUBES AND OVARIES;  HYSTERECTOMY WITH BILATERAL SALPINGO-OOPHORECTOMY:  - HIGH-GRADE SEROUS ADENOCARCINOMA.  - REFER TO CANCER SUMMARY BELOW.   CANCER CASE SUMMARY: OVARY or FALLOPIAN TUBE or PRIMARY PERITONEUM  Procedure: Total hysterectomy with bilateral salpingo-oophorectomy  Specimen Integrity: Intact  Tumor Site: Bilateral ovaries and  fallopian tubes  Ovarian Surface Involvement: Present, bilateral  Fallopian Tube Surface Involvement: Present, left  Tumor Size: Greatest dimension 1.2 cm  Histologic Type: Serous carcinoma  Histologic Grade: High grade  Implants: N/A  Other Tissue/ Organ Involvement: Appendix, parametrium  Largest Extrapelvic Peritoneal Focus: Microscopic  Peritoneal/Ascitic Fluid: Positive for malignancy  Treatment Effect: Moderate response; see comment.  Regional Lymph Nodes: No nodes submitted or found  Pathologic Stage Classification (pTNM, AJCC 8th Edition): pT3a pNX pM1b  FIGO: IVB  TNM Descriptors: N/A   Comment:  Chemotherapy Response Score (CRS) is assigned based on evaluation of tumor response in the omentum, which is not submitted in this specimen. In the submitted tissue, there is a partial/moderate tumor response.   B. APPENDIX, APPENDECTOMY:  - TRANSMURAL INVOLVEMENT BY HIGH-GRADE SEROUS ADENOCARCINOMA.  Patient then completed 3 cycles of carbotaxol adjuvantly and is currently on maintenancemvasi   Interval history-patient reports doing well.  Right foot drop is improving.  Appetite is good and weight has remained stable.  Denies any new complaints at this time  ECOG PS- 1 Pain scale- 0  Review of systems- Review of Systems  Constitutional: Negative for chills, fever, malaise/fatigue and weight loss.  HENT: Negative for congestion, ear discharge and nosebleeds.   Eyes: Negative for blurred vision.  Respiratory: Negative for cough, hemoptysis, sputum production, shortness of breath and wheezing.   Cardiovascular: Negative for chest pain, palpitations, orthopnea and claudication.  Gastrointestinal: Negative for abdominal pain, blood in stool, constipation, diarrhea, heartburn, melena, nausea and vomiting.  Genitourinary: Negative for dysuria, flank pain, frequency, hematuria and urgency.  Musculoskeletal: Negative for back pain, joint pain and myalgias.  Skin: Negative for rash.   Neurological: Negative for dizziness, tingling, focal weakness, seizures, weakness and headaches.  Endo/Heme/Allergies: Does not bruise/bleed easily.  Psychiatric/Behavioral: Negative for depression and suicidal  ideas. The patient does not have insomnia.       Allergies  Allergen Reactions  . Clarithromycin Nausea Only  . Doxycycline Nausea Only    dizziness  . Penicillins Hives    Did it involve swelling of the face/tongue/throat, SOB, or low BP? No Did it involve sudden or severe rash/hives, skin peeling, or any reaction on the inside of your mouth or nose? No Did you need to seek medical attention at a hospital or doctor's office? No When did it last happen?50+ years ago If all above answers are "NO", may proceed with cephalosporin use.      Past Medical History:  Diagnosis Date  . Allergy   . Anemia   . Complication of anesthesia   . Diabetes mellitus without complication ([place])   . Diverticulosis   . Family history of adverse reaction to anesthesia    mom-n/v  . Family history of lung cancer   . GERD (gastroesophageal reflux disease)    h/o  . Hyperlipidemia   . Hypothyroidism   . Ovarian cancer ([place]) 08/04/2018   Chemo tx's.  . Personal history of chemotherapy   . PONV (postoperative nausea and vomiting)    sick with tonsillectomy  . 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 Thyroid disease      Past Surgical History:  Procedure Laterality Date  . ABDOMINAL HYSTERECTOMY    . BACK SURGERY  2007   Lumbar Surgery  . BREAST CYST ASPIRATION Right    Around 10 years ago. Pt thinks it was the right breast but not sure.  Marland Kitchen CATARACT EXTRACTION Left 2010   Right Eye 2011  . COLONOSCOPY WITH PROPOFOL N/A 09/02/2015   Procedure: COLONOSCOPY WITH PROPOFOL;  Surgeon: [name], MD;  Location: [place];  Service: Endoscopy;  Laterality: N/A;  Diabetic - oral meds  . ESOPHAGOGASTRODUODENOSCOPY (EGD) WITH PROPOFOL N/A 11/03/2015   Procedure:  ESOPHAGOGASTRODUODENOSCOPY (EGD) WITH PROPOFOL;  Surgeon: [name], MD;  Location: [place];  Service: Endoscopy;  Laterality: N/A;  . EYE SURGERY Bilateral 06/2014    tear duck catherization ( Dr. Vallarie Mare)  . LAPAROSCOPIC APPENDECTOMY  11/26/2018   Procedure: APPENDECTOMY LAPAROSCOPIC;  Surgeon: Ward, Honor Loh, MD;  Location: [facility];  Service: Gynecology;;  . POLYPECTOMY  09/02/2015   Procedure: POLYPECTOMY;  Surgeon: [name], MD;  Location: [place];  Service: Endoscopy;;  . TONSILLECTOMY  1963    Social History   Socioeconomic History  . Marital status: Single    Spouse name: Not on file  . Number of children: Not on file  . Years of education: Not on file  . Highest education level: Not on file  Occupational History  . Not on file  Tobacco Use  . Smoking status: Never Smoker  . Smokeless tobacco: Never Used  Substance and Sexual Activity  . Alcohol use: No  . Drug use: No  . Sexual activity: Not Currently  Other Topics Concern  . Not on file  Social History Narrative  . Not on file   Social Determinants of Health   Financial Resource Strain:   . Difficulty of Paying Living Expenses:   Food Insecurity:   . Worried About Charity fundraiser in the Last Year:   . Arboriculturist in the Last Year:   Transportation Needs:   . Film/video editor (Medical):   Marland Kitchen Lack of Transportation (Non-Medical):   Physical Activity:   . Days of Exercise per  Week:   . Minutes of Exercise per Session:   Stress:   . Feeling of Stress :   Social Connections:   . Frequency of Communication with Friends and Family:   . Frequency of Social Gatherings with Friends and Family:   . Attends Religious Services:   . Active Member of Clubs or Organizations:   . Attends Archivist Meetings:   Marland Kitchen Marital Status:   Intimate Partner Violence:   . Fear of Current or Ex-Partner:   . Emotionally Abused:   Marland Kitchen Physically Abused:   . Sexually Abused:      Family History  Problem Relation Age of Onset  . Congestive Heart Failure Mother   . Diabetes Mother   . Stroke Mother   . Hypothyroidism Father   . Cancer Father        lung cancer  . Diabetes Father   . Cancer Paternal Grandmother        stomach  . Breast cancer Neg Hx      Current Outpatient Medications:  .  acetaminophen (TYLENOL) 500 MG tablet, Take 2 tablets (1,000 mg total) by mouth every 6 (six) hours., Disp: , Rfl:  .  aspirin 81 MG tablet, Take 81 mg by mouth daily. , Disp: , Rfl:  .  dexamethasone (DECADRON) 4 MG tablet, Take 2 tablets (8 mg total) by mouth daily. Start the day after carboplatin chemotherapy for 3 days., Disp: 30 tablet, Rfl: 1 .  fexofenadine (ALLEGRA) 60 MG tablet, Take 60 mg by mouth daily as needed for allergies. , Disp: , Rfl:  .  furosemide (LASIX) 20 MG tablet, Take 1 tablet (20 mg total) by mouth daily., Disp: 20 tablet, Rfl: 0 .  gabapentin (NEURONTIN) 300 MG capsule, Take 1 capsule (300 mg total) by mouth at bedtime. Take 2 tablets at bedtime once and then 3 tablets for three nights at bedtime., Disp: 11 capsule, Rfl: 0 .  glipiZIDE (GLUCOTROL) 5 MG tablet, TAKE 1 TABLET BY MOUTH DAILY BEFORE BREAKFAST (Patient taking differently: Take 5 mg by mouth daily before breakfast. ), Disp: 90 tablet, Rfl: 1 .  glucose blood (ONE TOUCH ULTRA TEST) test strip, USE TO TEST BLOOD SUGAR TWICE A DAY AS INSTRUCTED, Disp: 100 each, Rfl: 12 .  ibuprofen (ADVIL) 600 MG tablet, Take 1 tablet (600 mg total) by mouth every 6 (six) hours., Disp: 45 tablet, Rfl: 1 .  levothyroxine (SYNTHROID) 100 MCG tablet, Take 1 tablet (100 mcg total) by mouth daily., Disp: 90 tablet, Rfl: 1 .  LORazepam (ATIVAN) 0.5 MG tablet, Take 1 tablet (0.5 mg total) by mouth every 6 (six) hours as needed (Nausea or vomiting)., Disp: 30 tablet, Rfl: 0 .  metFORMIN (GLUCOPHAGE) 500 MG tablet, Take 2 tablets (1,000 mg total) by mouth 2 (two) times daily., Disp: 360 tablet, Rfl: 1 .  MULTIPLE  VITAMIN PO, Take 1 tablet by mouth daily. , Disp: , Rfl:  .  ondansetron (ZOFRAN) 8 MG tablet, Take 1 tablet (8 mg total) by mouth 2 (two) times daily as needed for refractory nausea / vomiting. Start on day 3 after carboplatin chemo., Disp: 30 tablet, Rfl: 1 .  ONETOUCH DELICA LANCETS 15Q MISC, USE TO TEST BLOOD SUGAR ONCE A DAY, Disp: 100 each, Rfl: 12 .  oxyCODONE (ROXICODONE) 5 MG immediate release tablet, Take 1 tablet (5 mg total) by mouth every 8 (eight) hours as needed., Disp: 16 tablet, Rfl: 0 .  prochlorperazine (COMPAZINE) 10 MG tablet, Take 1 tablet (10  mg total) by mouth every 6 (six) hours as needed (Nausea or vomiting)., Disp: 30 tablet, Rfl: 1 .  simvastatin (ZOCOR) 20 MG tablet, Take 1 tablet (20 mg total) by mouth at bedtime. Please schedule office visit before any future refills, Disp: 90 tablet, Rfl: 0 .  spironolactone (ALDACTONE) 50 MG tablet, Take 1 tablet (50 mg total) by mouth daily., Disp: 20 tablet, Rfl: 0 .  calcipotriene (DOVONOX) 0.005 % ointment, Apply 1 application topically as needed. CALCIPOTRIENE, 0.005% (External Ointment) - Historical Medication  apply to skin daily (0.005 %) Active, Disp: , Rfl:  .  insulin degludec (TRESIBA FLEXTOUCH) 100 UNIT/ML SOPN FlexTouch Pen, Inject 15 Units into the skin daily., Disp: , Rfl:  No current facility-administered medications for this visit.  Facility-Administered Medications Ordered in Other Visits:  .  sodium chloride flush (NS) 0.9 % injection 10 mL, 10 mL, Intravenous, Once, Sindy Guadeloupe, MD .  sodium chloride flush (NS) 0.9 % injection 10 mL, 10 mL, Intravenous, Once, Sindy Guadeloupe, MD .  sodium chloride flush (NS) 0.9 % injection 10 mL, 10 mL, Intravenous, Once, Sindy Guadeloupe, MD  Physical exam:  Vitals:   04/27/19 0903 04/27/19 0906  BP: 126/78 126/76  Pulse: 76 76  Resp: 18 18  Temp: (!) 96.6 F (35.9 C) (!) 96.6 F (35.9 C)  TempSrc: Tympanic Tympanic  SpO2: 100%   Weight: 107 lb 4.8 oz (48.7 kg) 107 lb  4.8 oz (48.7 kg)   Physical Exam HENT:     Head: Normocephalic and atraumatic.  Eyes:     Pupils: Pupils are equal, round, and reactive to light.  Cardiovascular:     Rate and Rhythm: Normal rate and regular rhythm.     Heart sounds: Normal heart sounds.  Pulmonary:     Effort: Pulmonary effort is normal.     Breath sounds: Normal breath sounds.  Abdominal:     General: Bowel sounds are normal. There is no distension.     Palpations: Abdomen is soft.     Tenderness: There is no abdominal tenderness.  Musculoskeletal:     Cervical back: Normal range of motion.  Skin:    General: Skin is warm and dry.  Neurological:     Mental Status: She is alert and oriented to person, place, and time.      CMP Latest Ref Rng & Units 04/27/2019  Glucose 70 - 99 mg/dL 212(H)  BUN 8 - 23 mg/dL 18  Creatinine 0.44 - 1.00 mg/dL 0.63  Sodium 135 - 145 mmol/L 132(L)  Potassium 3.5 - 5.1 mmol/L 4.2  Chloride 98 - 111 mmol/L 97(L)  CO2 22 - 32 mmol/L 29  Calcium 8.9 - 10.3 mg/dL 9.5  Total Protein 6.5 - 8.1 g/dL 7.3  Total Bilirubin 0.3 - 1.2 mg/dL 0.5  Alkaline Phos 38 - 126 U/L 51  AST 15 - 41 U/L 32  ALT 0 - 44 U/L 38   CBC Latest Ref Rng & Units 04/27/2019  WBC 4.0 - 10.5 K/uL 5.5  Hemoglobin 12.0 - 15.0 g/dL 11.9(L)  Hematocrit 36.0 - 46.0 % 35.0(L)  Platelets 150 - 400 K/uL 139(L)      Assessment and plan- Patient is a 64 y.o. 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 Avastin was held for cycle 4. She is s/p robotic assisted total laparoscopic hysterectomy and bilateral salpingo-oophorectomy and appendectomy with extensive lysis of adhesions and abdomen  debulking.She has completed 6 cycles of carbotaxol chemotherapy ( 4neoadjuvantly and2adjuvantly).   She is here for on treatment assessment prior to cycle 5 of maintenance Mvasi chemotherapy  Counts okay to  proceed with cycle 5 of maintenance Mvasi chemotherapy today.  Urine protein remains  trace and blood pressure is overall stable. Her CA-125 did increase to 50.6 from a prior value of 11.8.  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  She is following up with physical therapy.  Continue gabapentin.  Visit Diagnosis 1. Encounter for monoclonal antibody treatment for malignancy   2. Chemotherapy-induced peripheral neuropathy ([place])   3. Serous adenocarcinoma ([place])      Dr. Randa Evens, MD, MPH [facility] [phone] 04/28/2019 12:48 PM

## 2019-04-30 ENCOUNTER — Ambulatory Visit: Payer: Commercial Managed Care - PPO

## 2019-04-30 ENCOUNTER — Other Ambulatory Visit: Payer: Self-pay

## 2019-04-30 DIAGNOSIS — M21371 Foot drop, right foot: Secondary | ICD-10-CM | POA: Diagnosis not present

## 2019-04-30 DIAGNOSIS — R262 Difficulty in walking, not elsewhere classified: Secondary | ICD-10-CM

## 2019-04-30 NOTE — Therapy (Signed)
[facility] [address], [place], [address] Phone: [phone]   Fax:  [phone]  Physical Therapy Treatment  Patient Details  Name: Meredith Jones MRN: [id number] Date of Birth: 1955-05-17 Referring Provider (PT): Janese Banks MD   Encounter Date: 04/30/2019  PT End of Session - 04/30/19 1553    Visit Number  18    Number of Visits  21    Date for PT Re-Evaluation  05/05/19    Authorization Type  3/10    PT Start Time  1300    PT Stop Time  1345    PT Time Calculation (min)  45 min    Equipment Utilized During Treatment  Gait belt    Activity Tolerance  Patient tolerated treatment well    Behavior During Therapy  [facility] for tasks assessed/performed       Past Medical History:  Diagnosis Date  . Allergy   . Anemia   . Complication of anesthesia   . Diabetes mellitus without complication ([place])   . Diverticulosis   . Family history of adverse reaction to anesthesia    mom-n/v  . Family history of lung cancer   . GERD (gastroesophageal reflux disease)    h/o  . Hyperlipidemia   . Hypothyroidism   . Ovarian cancer ([place]) 08/04/2018   Chemo tx's.  . Personal history of chemotherapy   . PONV (postoperative nausea and vomiting)    sick with tonsillectomy  . 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 Thyroid disease     Past Surgical History:  Procedure Laterality Date  . ABDOMINAL HYSTERECTOMY    . BACK SURGERY  2007   Lumbar Surgery  . BREAST CYST ASPIRATION Right    Around 10 years ago. Pt thinks it was the right breast but not sure.  Marland Kitchen CATARACT EXTRACTION Left 2010   Right Eye 2011  . COLONOSCOPY WITH PROPOFOL N/A 09/02/2015   Procedure: COLONOSCOPY WITH PROPOFOL;  Surgeon: [name], MD;  Location: [place];  Service: Endoscopy;  Laterality: N/A;  Diabetic - oral meds  . ESOPHAGOGASTRODUODENOSCOPY (EGD) WITH PROPOFOL N/A 11/03/2015   Procedure: ESOPHAGOGASTRODUODENOSCOPY (EGD) WITH  PROPOFOL;  Surgeon: [name], MD;  Location: [place];  Service: Endoscopy;  Laterality: N/A;  . EYE SURGERY Bilateral 06/2014    tear duck catherization ( Dr. Vallarie Mare)  . LAPAROSCOPIC APPENDECTOMY  11/26/2018   Procedure: APPENDECTOMY LAPAROSCOPIC;  Surgeon: Ward, Honor Loh, MD;  Location: [facility];  Service: Gynecology;;  . POLYPECTOMY  09/02/2015   Procedure: POLYPECTOMY;  Surgeon: [name], MD;  Location: [place];  Service: Endoscopy;;  . TONSILLECTOMY  1963    There were no vitals filed for this visit.  Subjective Assessment - 04/30/19 1317    Subjective  Patient reports she has been walking and cleaning her house. Patient states she has been reading for pleasure.    Pertinent History  CA history, radiation/chemo treatments, neuropathy into the feet    Limitations  Walking    How long can you walk comfortably?  Increased falls    Diagnostic tests  not performed    Patient Stated Goals  To decrease walking quality    Currently in Pain?  No/denies       TREATMENT  Neuromuscular reeducation Biphasic - patient was sitting and was told to actively contract anterior tibialis w/ the increase in intensity. Patient was assisted in DF movement by therapist Time: 10 minutes Frequency:  20Hz  Phase duration: 400 usec Duty Cycle: 4/4 - Sit to stand - 2 x 10 with focus on performing hip ER  with feet on airex pad  Side stepping over - x 5 with airex beam B Side stepping with YTB - x 10  Side stepping over airex beam - x 10  Single leg stance with intermittent UE support - x 10  Neuromuscular reeducation utilized to increase muscular strength of anterior tibialis to reduce fall risk     PT Education - 04/30/19 1550    Education Details  form/technique with exercise    Person(s) Educated  Patient    Methods  Explanation;Demonstration    Comprehension  Verbalized understanding;Returned demonstration       PT Short Term Goals - 03/18/19 1708      PT  SHORT TERM GOAL #1   Title  Patient will be independent with HEP to continue benefits of therapy after discharge.    Baseline  Dependent with form/technique    Time  6    Period  Weeks    Status  New    Target Date  04/29/19        PT Long Term Goals - 04/09/19 1412      PT LONG TERM GOAL #1   Title  Patient will improve 5xSTS to under 12 sec to decrease fall risk and ability to stand from low surfaces    Baseline  5XSTS: 25sec. 03/18/19: 15.4sec; 04/07/2019: 16.5sec. 04/09/2019: 19.38 sec    Time  6    Period  Weeks    Status  On-going    Target Date  05/07/19      PT LONG TERM GOAL #2   Title  Patient will improve TUG to under 8 sec to indicate decrease fall risks most notably with transferring to amb.    Baseline  TUG: 12.5sec, 03/18/19: 12.46 seconds; 04/07/2019: Deferred. 04/09/2019: 14.26 sec    Time  6    Period  Weeks    Status  On-going    Target Date  05/07/19      PT LONG TERM GOAL #3   Title  Patient will improve ability to perform 43mWT to over 1 m/s to indicate increased safety with community amb.    Baseline  .8 m/s, 03/18/19: .860m/s; 04/07/2019: Deferred. 04/09/2019: 0.35m/s    Time  6    Period  Weeks    Status  On-going    Target Date  05/07/19      PT LONG TERM GOAL #4   Title  Patient will improve FGA to 27 points to indicate improvement with functional and dynamic gait with exercise.    Baseline  20/30 FGA, 03/18/19 FGA: 18 /30; 2/23/20221: Deferred. 04/09/2019: 20/30    Time  6    Period  Weeks    Status  On-going    Target Date  05/07/19      PT LONG TERM GOAL #5   Title  Patient will improve her FOTO score to 77 to indicate significant improvement with her balance.    Baseline  55 FOTO, 03/18/19: 66; 04/07/2019: Defered. 04/09/2019: 58    Time  6    Period  Weeks    Status  New    Target Date  05/07/19      PT LONG TERM GOAL #6   Title  Patient will be able to achieve a 4/5 for DF MMT in order for her to ambulate around the home with decreased risk of  falling    Baseline  04/09/19: 2-/5 MMT for DF;    Time  6    Period  Weeks    Status  New            Plan - 04/30/19 1553    Clinical Impression Statement  Patient demonstrates improvement with single leg stance and tandem stance on airex pad with ability to perform without stepping out to maintain balance. Patient demonstrates difficulty with ability to perform dyanmic movements out of a single leg stance secondary to hip weakness. Continued ot demosntrate significant trendlenberg with ambulation, imprpvement compared to beginning of therapy. Patient will benefit from further skilled therapy to return to prior level of function.    Personal Factors and Comorbidities  Age;Comorbidity 3+;Past/Current Experience;Other    Comorbidities  hx of CA, neuropathy, drop foot    Examination-Activity Limitations  Carry;Stairs;Stand;Bend    Examination-Participation Restrictions  Cleaning;Community Activity;Yard Work    Merchant navy officer  Evolving/Moderate complexity    Rehab Potential  Fair    PT Frequency  2x / week    PT Duration  6 weeks    PT Treatment/Interventions  Balance training;Neuromuscular re-education;Functional mobility training;Therapeutic activities;Gait training;Stair training;Electrical Stimulation;Manual techniques;Energy conservation;Patient/family education;Therapeutic exercise    PT Next Visit Plan  Hip strength, dyanmic balance, Squats, low functioning glute med exercises    PT Home Exercise Plan  Sit to stands; SLS; hip abductor strength    Consulted and Agree with Plan of Care  Patient       Patient will benefit from skilled therapeutic intervention in order to improve the following deficits and impairments:  Decreased coordination, Decreased mobility, Decreased endurance, Decreased range of motion, Decreased activity tolerance, Decreased strength, Decreased balance, Difficulty walking, Pain, Abnormal gait  Visit Diagnosis: Difficulty in walking, not  elsewhere classified  Foot drop, right     Problem List Patient Active Problem List   Diagnosis Date Noted  . Bilateral primary ovarian cancer ([place]) 02/02/2019  . Genetic testing 12/09/2018  . Status post hysterectomy 11/26/2018  . Need for prophylactic vaccination and inoculation against influenza 11/04/2018  . Anemia of chronic disease 10/22/2018  . B12 deficiency 10/22/2018  . Family history of lung cancer   . Serous adenocarcinoma ([place]) 08/13/2018  . Goals of care, counseling/discussion 08/11/2018  . Primary high grade serous adenocarcinoma of ovary ([place]) 08/11/2018  . Loss of weight   . Benign neoplasm of descending colon   . Type 2 diabetes mellitus without complication, without long-term current use of insulin ([place]) 06/07/2015  . 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 Cyst of ovary 06/09/2014  . Bulky or enlarged uterus 06/09/2014  . Hypercholesteremia 02/09/2007  . Allergic rhinitis 11/27/2006  . Endometriosis 11/27/2006  . Adult hypothyroidism 11/27/2006    Blythe Stanford, PT DPT 04/30/2019, 3:56 PM  [facility] [address] [place], [address] Phone: [phone]   Fax:  [phone]  Name: Meredith Jones MRN: [id number] Date of Birth: 1955-10-18

## 2019-05-04 ENCOUNTER — Ambulatory Visit: Payer: Commercial Managed Care - PPO

## 2019-05-04 DIAGNOSIS — M21371 Foot drop, right foot: Secondary | ICD-10-CM

## 2019-05-04 DIAGNOSIS — R262 Difficulty in walking, not elsewhere classified: Secondary | ICD-10-CM

## 2019-05-04 NOTE — Therapy (Signed)
[facility] [address], [place], [address] Phone: [phone]   Fax:  [phone]  Physical Therapy Treatment  Patient Details  Name: Meredith Jones MRN: [id number] Date of Birth: 1955/08/31 Referring Provider (PT): Janese Banks MD   Encounter Date: 05/04/2019  PT End of Session - 05/04/19 1313    Visit Number  19    Number of Visits  21    Date for PT Re-Evaluation  05/05/19    Authorization Type  5/10    PT Start Time  1300    PT Stop Time  1345    PT Time Calculation (min)  45 min    Equipment Utilized During Treatment  Gait belt    Activity Tolerance  Patient tolerated treatment well    Behavior During Therapy  [facility] for tasks assessed/performed       Past Medical History:  Diagnosis Date  . Allergy   . Anemia   . Complication of anesthesia   . Diabetes mellitus without complication ([place])   . Diverticulosis   . Family history of adverse reaction to anesthesia    mom-n/v  . Family history of lung cancer   . GERD (gastroesophageal reflux disease)    h/o  . Hyperlipidemia   . Hypothyroidism   . Ovarian cancer ([place]) 08/04/2018   Chemo tx's.  . Personal history of chemotherapy   . PONV (postoperative nausea and vomiting)    sick with tonsillectomy  . 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 Thyroid disease     Past Surgical History:  Procedure Laterality Date  . ABDOMINAL HYSTERECTOMY    . BACK SURGERY  2007   Lumbar Surgery  . BREAST CYST ASPIRATION Right    Around 10 years ago. Pt thinks it was the right breast but not sure.  Marland Kitchen CATARACT EXTRACTION Left 2010   Right Eye 2011  . COLONOSCOPY WITH PROPOFOL N/A 09/02/2015   Procedure: COLONOSCOPY WITH PROPOFOL;  Surgeon: [name], MD;  Location: [place];  Service: Endoscopy;  Laterality: N/A;  Diabetic - oral meds  . ESOPHAGOGASTRODUODENOSCOPY (EGD) WITH PROPOFOL N/A 11/03/2015   Procedure: ESOPHAGOGASTRODUODENOSCOPY (EGD) WITH  PROPOFOL;  Surgeon: [name], MD;  Location: [place];  Service: Endoscopy;  Laterality: N/A;  . EYE SURGERY Bilateral 06/2014    tear duck catherization ( Dr. Vallarie Mare)  . LAPAROSCOPIC APPENDECTOMY  11/26/2018   Procedure: APPENDECTOMY LAPAROSCOPIC;  Surgeon: Ward, Honor Loh, MD;  Location: [facility];  Service: Gynecology;;  . POLYPECTOMY  09/02/2015   Procedure: POLYPECTOMY;  Surgeon: [name], MD;  Location: [place];  Service: Endoscopy;;  . TONSILLECTOMY  1963    There were no vitals filed for this visit.  Subjective Assessment - 05/04/19 1312    Subjective  Patient states she has been cleaning her house this weekend. Patient reports no major changes otherwise.    Pertinent History  CA history, radiation/chemo treatments, neuropathy into the feet    Limitations  Walking    How long can you walk comfortably?  Increased falls    Diagnostic tests  not performed    Patient Stated Goals  To decrease walking quality    Currently in Pain?  No/denies       TREATMENT  Neuromuscular reeducation Biphasic - patient was sitting and was told to actively contract anterior tibialis w/ the increase in intensity. Patient was assisted in DF movement by therapist Time: 12 minutes Frequency: 20Hz  Phase  duration: 400 usec Duty Cycle: 4/4 - Sit to stand - 2 x 12 with focus on performing hip ER with feet on airex pad from chair with GTB Tandem Amb on half foam - x 12 B Side stepping along half foam roller - -x 10 down and back Feet together balance on airex pad - x 10  Neuromuscular reeducation utilized to increase muscular strength of anterior tibialis to reduce fall risk --  PT Education - 05/04/19 1313    Education Details  form/technique with exercise    Person(s) Educated  Patient    Methods  Explanation;Demonstration    Comprehension  Verbalized understanding;Returned demonstration       PT Short Term Goals - 03/18/19 1708      PT SHORT TERM GOAL #1    Title  Patient will be independent with HEP to continue benefits of therapy after discharge.    Baseline  Dependent with form/technique    Time  6    Period  Weeks    Status  New    Target Date  04/29/19        PT Long Term Goals - 04/09/19 1412      PT LONG TERM GOAL #1   Title  Patient will improve 5xSTS to under 12 sec to decrease fall risk and ability to stand from low surfaces    Baseline  5XSTS: 25sec. 03/18/19: 15.4sec; 04/07/2019: 16.5sec. 04/09/2019: 19.38 sec    Time  6    Period  Weeks    Status  On-going    Target Date  05/07/19      PT LONG TERM GOAL #2   Title  Patient will improve TUG to under 8 sec to indicate decrease fall risks most notably with transferring to amb.    Baseline  TUG: 12.5sec, 03/18/19: 12.46 seconds; 04/07/2019: Deferred. 04/09/2019: 14.26 sec    Time  6    Period  Weeks    Status  On-going    Target Date  05/07/19      PT LONG TERM GOAL #3   Title  Patient will improve ability to perform 89mWT to over 1 m/s to indicate increased safety with community amb.    Baseline  .8 m/s, 03/18/19: .849m/s; 04/07/2019: Deferred. 04/09/2019: 0.30m/s    Time  6    Period  Weeks    Status  On-going    Target Date  05/07/19      PT LONG TERM GOAL #4   Title  Patient will improve FGA to 27 points to indicate improvement with functional and dynamic gait with exercise.    Baseline  20/30 FGA, 03/18/19 FGA: 18 /30; 2/23/20221: Deferred. 04/09/2019: 20/30    Time  6    Period  Weeks    Status  On-going    Target Date  05/07/19      PT LONG TERM GOAL #5   Title  Patient will improve her FOTO score to 77 to indicate significant improvement with her balance.    Baseline  55 FOTO, 03/18/19: 66; 04/07/2019: Defered. 04/09/2019: 58    Time  6    Period  Weeks    Status  New    Target Date  05/07/19      PT LONG TERM GOAL #6   Title  Patient will be able to achieve a 4/5 for DF MMT in order for her to ambulate around the home with decreased risk of falling    Baseline   04/09/19: 2-/5 MMT  for DF;    Time  6    Period  Weeks    Status  New            Plan - 05/04/19 1333    Clinical Impression Statement  Continued to address higher level balance activtiies and LE standing strengthening exercises with encorporating ankle movement/activation into motion. Patient demonstrates difficulty with standing for prolonged periods of time with exercises requiring UE support to perform. Patient's Tibialis anterior activation better than previous sessions as indicated by improvement in activation with ability to perform greater overall AROM. Patient will benefit from further skilled therapy to return to prior level of function.    Personal Factors and Comorbidities  Age;Comorbidity 3+;Past/Current Experience;Other    Comorbidities  hx of CA, neuropathy, drop foot    Examination-Activity Limitations  Carry;Stairs;Stand;Bend    Examination-Participation Restrictions  Cleaning;Community Activity;Yard Work    [employer] officer  Evolving/Moderate complexity    Rehab Potential  Fair    PT Frequency  2x / week    PT Duration  6 weeks    PT Treatment/Interventions  Balance training;Neuromuscular re-education;Functional mobility training;Therapeutic activities;Gait training;Stair training;Electrical Stimulation;Manual techniques;Energy conservation;Patient/family education;Therapeutic exercise    PT Next Visit Plan  Hip strength, dyanmic balance, Squats, low functioning glute med exercises    PT Home Exercise Plan  Sit to stands; SLS; hip abductor strength    Consulted and Agree with Plan of Care  Patient       Patient will benefit from skilled therapeutic intervention in order to improve the following deficits and impairments:  Decreased coordination, Decreased mobility, Decreased endurance, Decreased range of motion, Decreased activity tolerance, Decreased strength, Decreased balance, Difficulty walking, Pain, Abnormal gait  Visit Diagnosis: Difficulty in  walking, not elsewhere classified  Foot drop, right     Problem List Patient Active Problem List   Diagnosis Date Noted  . Bilateral primary ovarian cancer ([place]) 02/02/2019  . Genetic testing 12/09/2018  . Status post hysterectomy 11/26/2018  . Need for prophylactic vaccination and inoculation against influenza 11/04/2018  . Anemia of chronic disease 10/22/2018  . B12 deficiency 10/22/2018  . Family history of lung cancer   . 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 Goals of care, counseling/discussion 08/11/2018  . 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 Loss of weight   . Benign neoplasm of descending colon   . Type 2 diabetes mellitus without complication, without long-term current use of insulin ([place]) 06/07/2015  . 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 Cyst of ovary 06/09/2014  . Bulky or enlarged uterus 06/09/2014  . Hypercholesteremia 02/09/2007  . Allergic rhinitis 11/27/2006  . Endometriosis 11/27/2006  . Adult hypothyroidism 11/27/2006    Blythe Stanford, PT DPT 05/04/2019, 4:43 PM  [facility] [address], [place], [address] Phone: [phone]   Fax:  [phone]  Name: DINA CONDREY MRN: [id number] Date of Birth: [birth year]

## 2019-05-06 ENCOUNTER — Ambulatory Visit: Payer: Commercial Managed Care - PPO

## 2019-05-06 ENCOUNTER — Other Ambulatory Visit: Payer: Self-pay

## 2019-05-06 ENCOUNTER — Inpatient Hospital Stay: Payer: Commercial Managed Care - PPO

## 2019-05-06 DIAGNOSIS — M21371 Foot drop, right foot: Secondary | ICD-10-CM | POA: Diagnosis not present

## 2019-05-06 DIAGNOSIS — R262 Difficulty in walking, not elsewhere classified: Secondary | ICD-10-CM

## 2019-05-06 NOTE — Therapy (Signed)
[facility] [address] [place], [address] Phone: [phone]   Fax:  [phone]  Physical Therapy Treatment  Patient Details  Name: Meredith Jones MRN: [id number] Date of Birth: 05/06/1955 Referring Provider (PT): Janese Banks MD   Encounter Date: 05/06/2019  PT End of Session - 05/06/19 1434    Visit Number  20    Number of Visits  21    Date for PT Re-Evaluation  05/05/19    Authorization Type  6/10    PT Start Time  [id number]    PT Stop Time  1505    PT Time Calculation (min)  45 min    Equipment Utilized During Treatment  Gait belt    Activity Tolerance  Patient tolerated treatment well    Behavior During Therapy  [employer] for tasks assessed/performed       Past Medical History:  Diagnosis Date  . Allergy   . Anemia   . Complication of anesthesia   . Diabetes mellitus without complication ([place])   . Diverticulosis   . Family history of adverse reaction to anesthesia    mom-n/v  . Family history of lung cancer   . GERD (gastroesophageal reflux disease)    h/o  . Hyperlipidemia   . Hypothyroidism   . Ovarian cancer ([place]) 08/04/2018   Chemo tx's.  . Personal history of chemotherapy   . PONV (postoperative nausea and vomiting)    sick with tonsillectomy  . 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 Thyroid disease     Past Surgical History:  Procedure Laterality Date  . ABDOMINAL HYSTERECTOMY    . BACK SURGERY  2007   Lumbar Surgery  . BREAST CYST ASPIRATION Right    Around 10 years ago. Pt thinks it was the right breast but not sure.  Marland Kitchen CATARACT EXTRACTION Left 2010   Right Eye 2011  . COLONOSCOPY WITH PROPOFOL N/A 09/02/2015   Procedure: COLONOSCOPY WITH PROPOFOL;  Surgeon: [name], MD;  Location: [facility] [place];  Service: Endoscopy;  Laterality: N/A;  Diabetic - oral meds  . ESOPHAGOGASTRODUODENOSCOPY (EGD) WITH PROPOFOL N/A 11/03/2015   Procedure: ESOPHAGOGASTRODUODENOSCOPY (EGD) WITH  PROPOFOL;  Surgeon: [name], MD;  Location: [place];  Service: Endoscopy;  Laterality: N/A;  . EYE SURGERY Bilateral 06/2014    tear duck catherization ( Dr. Vallarie Mare)  . LAPAROSCOPIC APPENDECTOMY  11/26/2018   Procedure: APPENDECTOMY LAPAROSCOPIC;  Surgeon: Ward, Honor Loh, MD;  Location: [facility];  Service: Gynecology;;  . POLYPECTOMY  09/02/2015   Procedure: POLYPECTOMY;  Surgeon: [name], MD;  Location: [facility];  Service: Endoscopy;;  . TONSILLECTOMY  1963    There were no vitals filed for this visit.  Subjective Assessment - 05/06/19 1427    Subjective  Patient states she was cleaning her house today and states increased difficulty with carrying water and bending over.    Pertinent History  CA history, radiation/chemo treatments, neuropathy into the feet    Limitations  Walking    How long can you walk comfortably?  Increased falls    Diagnostic tests  not performed    Patient Stated Goals  To decrease walking quality    Currently in Pain?  No/denies       TREATMENT  Neuromuscular reeducation Biphasic - patient was sitting and was told to actively contract anterior tibialis w/ the increase in intensity. Patient was assisted in DF movement by therapist Time: 12 minutes Frequency:  20Hz  Phase duration: 400 usec Duty Cycle: 4/4 - Sit to stand - x5  with focus on performing hip ER with feet on airex pad from chair with GTB Tandem amb - x 12 B Walking EC Walking backwards stairs Side stepping with YTB Hip abduction/extension with marching and YTB Neuromuscular reeducation utilized to increase muscular strength of anterior tibialis to reduce fall risk   PT Education - 05/06/19 1428    Education Details  form/technique with exercise    Person(s) Educated  Patient    Methods  Explanation;Demonstration    Comprehension  Verbalized understanding;Returned demonstration       PT Short Term Goals - 03/18/19 1708      PT SHORT TERM GOAL #1    Title  Patient will be independent with HEP to continue benefits of therapy after discharge.    Baseline  Dependent with form/technique    Time  6    Period  Weeks    Status  New    Target Date  04/29/19        PT Long Term Goals - 05/06/19 1425      PT LONG TERM GOAL #1   Title  Patient will improve 5xSTS to under 12 sec to decrease fall risk and ability to stand from low surfaces    Baseline  5XSTS: 25sec. 03/18/19: 15.4sec; 04/07/2019: 16.5sec. 04/09/2019: 19.38 sec;  05/06/2019: 12.84sec    Time  6    Period  Weeks    Status  On-going      PT LONG TERM GOAL #2   Title  Patient will improve TUG to under 8 sec to indicate decrease fall risks most notably with transferring to amb.    Baseline  TUG: 12.5sec, 03/18/19: 12.46 seconds; 04/07/2019: Deferred. 04/09/2019: 14.26 sec;  05/06/2019: 10.88 sec    Time  6    Period  Weeks    Status  On-going      PT LONG TERM GOAL #3   Title  Patient will improve ability to perform 38mWT to over 1 m/s to indicate increased safety with community amb.    Baseline  .8 m/s, 03/18/19: .860m/s; 04/07/2019: Deferred. 04/09/2019: 0.62m/s; 05/06/2019: 1.13 m/s    Time  6    Period  Weeks    Status  Achieved      PT LONG TERM GOAL #4   Title  Patient will improve FGA to 27 points to indicate improvement with functional and dynamic gait with exercise.    Baseline  20/30 FGA, 03/18/19 FGA: 18 /30; 2/23/20221: Deferred. 04/09/2019: 20/30; 05/06/2019: 25/30    Time  6    Period  Weeks    Status  On-going      PT LONG TERM GOAL #5   Title  Patient will improve her FOTO score to 77 to indicate significant improvement with her balance.    Baseline  55 FOTO, 03/18/19: 66; 04/07/2019: Defered. 04/09/2019: 58; 05/06/2019: 61    Time  6    Period  Weeks    Status  On-going      PT LONG TERM GOAL #6   Title  Patient will be able to achieve a 4/5 for DF MMT in order for her to ambulate around the home with decreased risk of falling    Baseline  04/09/19: 2-/5 MMT for DF;   05/06/2019: 2/5    Time  6    Period  Weeks    Status  On-going  Plan - 05/06/19 1656    Clinical Impression Statement  Patient is making significant progress towards long term goals with greater abilitty to perform greater amount of functional measures such as the TUG, 5XSTS, 29mWT, and FGA indicating significant improvement with dynamic balance and functional LE strength. Patient continues to have difficulty with performing dynamic actvities and on single leg stance. Patient will benefit from further skilled therapy to return to prior level of function.    Personal Factors and Comorbidities  Age;Comorbidity 3+;Past/Current Experience;Other    Comorbidities  hx of CA, neuropathy, drop foot    Examination-Activity Limitations  Carry;Stairs;Stand;Bend    Examination-Participation Restrictions  Cleaning;Community Activity;Yard Work    [employer]chant [employer] officer  Evolving/Moderate complexity    Rehab Potential  Fair    PT Frequency  2x / week    PT Duration  6 weeks    PT Treatment/Interventions  Balance training;Neuromuscular re-education;Functional mobility training;Therapeutic activities;Gait training;Stair training;Electrical Stimulation;Manual techniques;Energy conservation;Patient/family education;Therapeutic exercise    PT Next Visit Plan  Hip strength, dyanmic balance, Squats, low functioning glute med exercises    PT Home Exercise Plan  Sit to stands; SLS; hip abductor strength    Consulted and Agree with Plan of Care  Patient       Patient will benefit from skilled therapeutic intervention in order to improve the following deficits and impairments:  Decreased coordination, Decreased mobility, Decreased endurance, Decreased range of motion, Decreased activity tolerance, Decreased strength, Decreased balance, Difficulty walking, Pain, Abnormal gait  Visit Diagnosis: Difficulty in walking, not elsewhere classified  Foot drop, right     Problem List Patient  Active Problem List   Diagnosis Date Noted  . Bilateral primary ovarian cancer ([place]) 02/02/2019  . Genetic testing 12/09/2018  . Status post hysterectomy 11/26/2018  . Need for prophylactic vaccination and inoculation against influenza 11/04/2018  . Anemia of chronic disease 10/22/2018  . B12 deficiency 10/22/2018  . Family history of lung cancer   . Serous adenocarcinoma ([place]) 08/13/2018  . Goals of care, counseling/discussion 08/11/2018  . 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 Loss of weight   . Benign neoplasm of descending colon   . Type 2 diabetes mellitus without complication, without long-term current use of insulin ([place]) 06/07/2015  . 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 Cyst of ovary 06/09/2014  . Bulky or enlarged uterus 06/09/2014  . Hypercholesteremia 02/09/2007  . Allergic rhinitis 11/27/2006  . Endometriosis 11/27/2006  . Adult hypothyroidism 11/27/2006    Blythe Stanford, PT DPT 05/06/2019, 5:02 PM  [facility] [address], [place], [address] Phone: [phone]   Fax:  [phone]  Name: Meredith Jones MRN: [id number] Date of Birth: 1955/09/21

## 2019-05-08 NOTE — Progress Notes (Signed)
Pharmacist Chemotherapy Monitoring - Follow Up Assessment    I verify that I have reviewed each item in the below checklist:  . Regimen for the patient is scheduled for the appropriate day and plan matches scheduled date. Marland Kitchen Appropriate non-routine labs are ordered dependent on drug ordered. . If applicable, additional medications reviewed and ordered per protocol based on lifetime cumulative doses and/or treatment regimen.   Plan for follow-up and/or issues identified: No . I-vent associated with next due treatment: No . MD and/or nursing notified: No  Graeden Bitner K 05/08/2019 8:35 AM

## 2019-05-13 ENCOUNTER — Ambulatory Visit: Payer: Commercial Managed Care - PPO

## 2019-05-13 ENCOUNTER — Other Ambulatory Visit: Payer: Self-pay

## 2019-05-13 DIAGNOSIS — R262 Difficulty in walking, not elsewhere classified: Secondary | ICD-10-CM

## 2019-05-13 DIAGNOSIS — M21371 Foot drop, right foot: Secondary | ICD-10-CM

## 2019-05-13 NOTE — Therapy (Signed)
[facility] [address] [place], [address] Phone: [phone]   Fax:  [phone]  Physical Therapy Treatment  Patient Details  Name: Meredith Jones MRN: [id number] Date of Birth: [birth year] Referring Provider (PT): Janese Banks MD   Encounter Date: 05/13/2019  PT End of Session - 05/13/19 1418    Visit Number  21    Number of Visits  21    Date for PT Re-Evaluation  05/05/19    Authorization Type  1/10    PT Start Time  [id number]    PT Stop Time  1430    PT Time Calculation (min)  45 min    Equipment Utilized During Treatment  Gait belt    Activity Tolerance  Patient tolerated treatment well    Behavior During Therapy  [facility] for tasks assessed/performed       Past Medical History:  Diagnosis Date  . Allergy   . Anemia   . Complication of anesthesia   . Diabetes mellitus without complication ([place])   . Diverticulosis   . Family history of adverse reaction to anesthesia    mom-n/v  . Family history of lung cancer   . GERD (gastroesophageal reflux disease)    h/o  . Hyperlipidemia   . Hypothyroidism   . Ovarian cancer ([place]) 08/04/2018   Chemo tx's.  . Personal history of chemotherapy   . PONV (postoperative nausea and vomiting)    sick with tonsillectomy  . 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 Thyroid disease     Past Surgical History:  Procedure Laterality Date  . ABDOMINAL HYSTERECTOMY    . BACK SURGERY  2007   Lumbar Surgery  . BREAST CYST ASPIRATION Right    Around 10 years ago. Pt thinks it was the right breast but not sure.  Marland Kitchen CATARACT EXTRACTION Left 2010   Right Eye 2011  . COLONOSCOPY WITH PROPOFOL N/A 09/02/2015   Procedure: COLONOSCOPY WITH PROPOFOL;  Surgeon: [name], MD;  Location: [facility];  Service: Endoscopy;  Laterality: N/A;  Diabetic - oral meds  . ESOPHAGOGASTRODUODENOSCOPY (EGD) WITH PROPOFOL N/A 11/03/2015   Procedure: ESOPHAGOGASTRODUODENOSCOPY (EGD) WITH  PROPOFOL;  Surgeon: [name], MD;  Location: [facility];  Service: Endoscopy;  Laterality: N/A;  . EYE SURGERY Bilateral 06/2014    tear duck catherization ( Dr. Vallarie Mare)  . LAPAROSCOPIC APPENDECTOMY  11/26/2018   Procedure: APPENDECTOMY LAPAROSCOPIC;  Surgeon: Ward, Honor Loh, MD;  Location: [facility];  Service: Gynecology;;  . POLYPECTOMY  09/02/2015   Procedure: POLYPECTOMY;  Surgeon: [name], MD;  Location: [facility];  Service: Endoscopy;;  . TONSILLECTOMY  1963    There were no vitals filed for this visit.  Subjective Assessment - 05/13/19 1413    Subjective  Patient reports no major changes since the previous session. Patient reports she received her AFO and states it feels to walk with it.    Pertinent History  CA history, radiation/chemo treatments, neuropathy into the feet    Limitations  Walking    How long can you walk comfortably?  Increased falls    Diagnostic tests  not performed    Patient Stated Goals  To decrease walking quality    Currently in Pain?  No/denies       TREATMENT  Neuromuscular reeducation Biphasic - patient was sitting and was told to actively contract anterior tibialis w/ the increase in intensity. Patient was assisted in DF movement by  therapist Time: 12 minutes Frequency: 20Hz  Phase duration: 400 usec Duty Cycle: 4/4 - Sit to stand - 3x5, x 10 with focus on performing hip ER with feet on airex pad from chair with GTB Tandem amb tandem- x 24ft along airex beam Backwards amb tandem - x 9ft along airex beam Cone taps off of airex pad - x 15 without  Neuromuscular reeducation utilized to reduce fall risk -- Therapeutic Exercise Monster walks in standing - 3 x 22ft with RTB around knees  Hip abduction in standing with - x25 RTB around knees  Hip extension in standing with RTB around knees - x 25 Performed exercises to improve hip strength   PT Education - 05/13/19 1417    Education Details  form/technique with  exercise    Person(s) Educated  Patient    Methods  Explanation;Demonstration    Comprehension  Verbalized understanding;Returned demonstration       PT Short Term Goals - 03/18/19 1708      PT SHORT TERM GOAL #1   Title  Patient will be independent with HEP to continue benefits of therapy after discharge.    Baseline  Dependent with form/technique    Time  6    Period  Weeks    Status  New    Target Date  04/29/19        PT Long Term Goals - 05/06/19 1425      PT LONG TERM GOAL #1   Title  Patient will improve 5xSTS to under 12 sec to decrease fall risk and ability to stand from low surfaces    Baseline  5XSTS: 25sec. 03/18/19: 15.4sec; 04/07/2019: 16.5sec. 04/09/2019: 19.38 sec;  05/06/2019: 12.84sec    Time  6    Period  Weeks    Status  On-going      PT LONG TERM GOAL #2   Title  Patient will improve TUG to under 8 sec to indicate decrease fall risks most notably with transferring to amb.    Baseline  TUG: 12.5sec, 03/18/19: 12.46 seconds; 04/07/2019: Deferred. 04/09/2019: 14.26 sec;  05/06/2019: 10.88 sec    Time  6    Period  Weeks    Status  On-going      PT LONG TERM GOAL #3   Title  Patient will improve ability to perform 2mWT to over 1 m/s to indicate increased safety with community amb.    Baseline  .8 m/s, 03/18/19: .836m/s; 04/07/2019: Deferred. 04/09/2019: 0.39m/s; 05/06/2019: 1.13 m/s    Time  6    Period  Weeks    Status  Achieved      PT LONG TERM GOAL #4   Title  Patient will improve FGA to 27 points to indicate improvement with functional and dynamic gait with exercise.    Baseline  20/30 FGA, 03/18/19 FGA: 18 /30; 2/23/20221: Deferred. 04/09/2019: 20/30; 05/06/2019: 25/30    Time  6    Period  Weeks    Status  On-going      PT LONG TERM GOAL #5   Title  Patient will improve her FOTO score to 77 to indicate significant improvement with her balance.    Baseline  55 FOTO, 03/18/19: 66; 04/07/2019: Defered. 04/09/2019: 58; 05/06/2019: 61    Time  6    Period  Weeks     Status  On-going      PT LONG TERM GOAL #6   Title  Patient will be able to achieve a 4/5 for DF MMT in order  for her to ambulate around the home with decreased risk of falling    Baseline  04/09/19: 2-/5 MMT for DF;  05/06/2019: 2/5    Time  6    Period  Weeks    Status  On-going            Plan - 05/13/19 1429    Clinical Impression Statement  Patient demonstrates improvement with ability to perform greater amount of standing exercise compared to previous sessions. Educated patient on performance of walking with AFO and focused on improving heel strike and toe off to improve overall efficiency with walking. Patient is improving overall and will benefit from further skilled therapy to return to prior level of function.    Personal Factors and Comorbidities  Age;Comorbidity 3+;Past/Current Experience;Other    Comorbidities  hx of CA, neuropathy, drop foot    Examination-Activity Limitations  Carry;Stairs;Stand;Bend    Examination-Participation Restrictions  Cleaning;Community Activity;Yard Work    [employer]chant [employer] officer  Evolving/Moderate complexity    Rehab Potential  Fair    PT Frequency  2x / week    PT Duration  6 weeks    PT Treatment/Interventions  Balance training;Neuromuscular re-education;Functional mobility training;Therapeutic activities;Gait training;Stair training;Electrical Stimulation;Manual techniques;Energy conservation;Patient/family education;Therapeutic exercise    PT Next Visit Plan  Hip strength, dyanmic balance, Squats, low functioning glute med exercises    PT Home Exercise Plan  Sit to stands; SLS; hip abductor strength    Consulted and Agree with Plan of Care  Patient       Patient will benefit from skilled therapeutic intervention in order to improve the following deficits and impairments:  Decreased coordination, Decreased mobility, Decreased endurance, Decreased range of motion, Decreased activity tolerance, Decreased strength, Decreased  balance, Difficulty walking, Pain, Abnormal gait  Visit Diagnosis: Difficulty in walking, not elsewhere classified  Foot drop, right     Problem List Patient Active Problem List   Diagnosis Date Noted  . Bilateral primary ovarian cancer ([place]) 02/02/2019  . Genetic testing 12/09/2018  . Status post hysterectomy 11/26/2018  . Need for prophylactic vaccination and inoculation against influenza 11/04/2018  . Anemia of chronic disease 10/22/2018  . B12 deficiency 10/22/2018  . Family history of lung cancer   . 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 Goals of care, counseling/discussion 08/11/2018  . 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 Loss of weight   . Benign neoplasm of descending colon   . Type 2 diabetes mellitus without complication, without long-term current use of insulin ([place]) 06/07/2015  . 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 Cyst of ovary 06/09/2014  . Bulky or enlarged uterus 06/09/2014  . Hypercholesteremia 02/09/2007  . Allergic rhinitis 11/27/2006  . Endometriosis 11/27/2006  . Adult hypothyroidism 11/27/2006    Blythe Stanford, PT DPT 05/13/2019, 3:00 PM  [facility] [address] [place], [address] Phone: [phone]   Fax:  [phone]  Name: MAHLI COONROD MRN: [id number] Date of Birth: 05/26/1955

## 2019-05-13 NOTE — Addendum Note (Signed)
Addended by: Blain Pais on: 05/13/2019 03:04 PM   Modules accepted: Orders

## 2019-05-18 ENCOUNTER — Other Ambulatory Visit: Payer: Self-pay

## 2019-05-18 ENCOUNTER — Inpatient Hospital Stay (HOSPITAL_BASED_OUTPATIENT_CLINIC_OR_DEPARTMENT_OTHER): Payer: Commercial Managed Care - PPO | Admitting: Oncology

## 2019-05-18 ENCOUNTER — Inpatient Hospital Stay: Payer: Commercial Managed Care - PPO

## 2019-05-18 ENCOUNTER — Inpatient Hospital Stay: Payer: Commercial Managed Care - PPO | Attending: Oncology

## 2019-05-18 ENCOUNTER — Encounter: Payer: Self-pay | Admitting: Oncology

## 2019-05-18 VITALS — BP 121/82 | HR 71

## 2019-05-18 VITALS — BP 138/83 | HR 79 | Temp 97.7°F | Resp 18 | Wt 110.0 lb

## 2019-05-18 DIAGNOSIS — T451X5A Adverse effect of antineoplastic and immunosuppressive drugs, initial encounter: Secondary | ICD-10-CM | POA: Diagnosis not present

## 2019-05-18 DIAGNOSIS — E039 Hypothyroidism, unspecified: Secondary | ICD-10-CM | POA: Diagnosis not present

## 2019-05-18 DIAGNOSIS — G620 Drug-induced polyneuropathy: Secondary | ICD-10-CM

## 2019-05-18 DIAGNOSIS — C569 Malignant neoplasm of unspecified ovary: Secondary | ICD-10-CM

## 2019-05-18 DIAGNOSIS — Z5112 Encounter for antineoplastic immunotherapy: Secondary | ICD-10-CM | POA: Insufficient documentation

## 2019-05-18 DIAGNOSIS — C801 Malignant (primary) neoplasm, unspecified: Secondary | ICD-10-CM

## 2019-05-18 LAB — CBC WITH DIFFERENTIAL/PLATELET
Abs Immature Granulocytes: 0.01 10*3/uL (ref 0.00–0.07)
Basophils Absolute: 0.1 10*3/uL (ref 0.0–0.1)
Basophils Relative: 1 %
Eosinophils Absolute: 1.2 10*3/uL — ABNORMAL HIGH (ref 0.0–0.5)
Eosinophils Relative: 21 %
HCT: 34.7 % — ABNORMAL LOW (ref 36.0–46.0)
Hemoglobin: 12.1 g/dL (ref 12.0–15.0)
Immature Granulocytes: 0 %
Lymphocytes Relative: 15 %
Lymphs Abs: 0.8 10*3/uL (ref 0.7–4.0)
MCH: 36 pg — ABNORMAL HIGH (ref 26.0–34.0)
MCHC: 34.9 g/dL (ref 30.0–36.0)
MCV: 103.3 fL — ABNORMAL HIGH (ref 80.0–100.0)
Monocytes Absolute: 0.5 10*3/uL (ref 0.1–1.0)
Monocytes Relative: 8 %
Neutro Abs: 3.1 10*3/uL (ref 1.7–7.7)
Neutrophils Relative %: 55 %
Platelets: 165 10*3/uL (ref 150–400)
RBC: 3.36 MIL/uL — ABNORMAL LOW (ref 3.87–5.11)
RDW: 13.4 % (ref 11.5–15.5)
WBC: 5.7 10*3/uL (ref 4.0–10.5)
nRBC: 0 % (ref 0.0–0.2)

## 2019-05-18 LAB — COMPREHENSIVE METABOLIC PANEL
ALT: 29 U/L (ref 0–44)
AST: 32 U/L (ref 15–41)
Albumin: 3.5 g/dL (ref 3.5–5.0)
Alkaline Phosphatase: 44 U/L (ref 38–126)
Anion gap: 11 (ref 5–15)
BUN: 19 mg/dL (ref 8–23)
CO2: 27 mmol/L (ref 22–32)
Calcium: 9.9 mg/dL (ref 8.9–10.3)
Chloride: 94 mmol/L — ABNORMAL LOW (ref 98–111)
Creatinine, Ser: 0.64 mg/dL (ref 0.44–1.00)
GFR calc Af Amer: >60 mL/min (ref >60)
GFR calc non Af Amer: >60 mL/min (ref >60)
Glucose, Bld: 157 mg/dL — ABNORMAL HIGH (ref 70–99)
Potassium: 4.6 mmol/L (ref 3.5–5.1)
Sodium: 132 mmol/L — ABNORMAL LOW (ref 135–145)
Total Bilirubin: 0.4 mg/dL (ref 0.3–1.2)
Total Protein: 7.2 g/dL (ref 6.5–8.1)

## 2019-05-18 LAB — PROTEIN, URINE, RANDOM: Total Protein, Urine: 9 mg/dL

## 2019-05-18 MED ORDER — SODIUM CHLORIDE 0.9 % IV SOLN
Freq: Once | INTRAVENOUS | Status: AC
  Filled 2019-05-18: qty 250

## 2019-05-18 MED ORDER — SODIUM CHLORIDE 0.9 % IV SOLN
15.0000 mg/kg | Freq: Once | INTRAVENOUS | Status: AC
  Administered 2019-05-18: 10:00:00 800 mg via INTRAVENOUS
  Filled 2019-05-18: qty 32

## 2019-05-18 NOTE — Progress Notes (Signed)
Patient is here for follow up she is doing well she now has a brace on her right leg.

## 2019-05-19 ENCOUNTER — Ambulatory Visit: Payer: Commercial Managed Care - PPO | Attending: Oncology

## 2019-05-19 DIAGNOSIS — R262 Difficulty in walking, not elsewhere classified: Secondary | ICD-10-CM | POA: Insufficient documentation

## 2019-05-19 DIAGNOSIS — M21371 Foot drop, right foot: Secondary | ICD-10-CM | POA: Insufficient documentation

## 2019-05-19 LAB — CA 125: Cancer Antigen (CA) 125: 74.5 U/mL — ABNORMAL HIGH (ref 0.0–38.1)

## 2019-05-19 NOTE — Therapy (Signed)
[facility] [address] [place], [address] Phone: [phone]   Fax:  [phone]  Physical Therapy Treatment  Patient Details  Name: Meredith Jones MRN: [id number] Date of Birth: 1955/04/04 Referring Provider (PT): Janese Banks MD   Encounter Date: 05/19/2019  PT End of Session - 05/19/19 1314    Visit Number  22    Number of Visits  30    Date for PT Re-Evaluation  06/17/19    Authorization Type  2/10    PT Start Time  1300    PT Stop Time  1345    PT Time Calculation (min)  45 min    Equipment Utilized During Treatment  Gait belt    Activity Tolerance  Patient tolerated treatment well    Behavior During Therapy  [facility] for tasks assessed/performed       Past Medical History:  Diagnosis Date  . Allergy   . Anemia   . Complication of anesthesia   . Diabetes mellitus without complication ([place])   . Diverticulosis   . Family history of adverse reaction to anesthesia    mom-n/v  . Family history of lung cancer   . GERD (gastroesophageal reflux disease)    h/o  . Hyperlipidemia   . Hypothyroidism   . Ovarian cancer ([place]) 08/04/2018   Chemo tx's.  . Personal history of chemotherapy   . PONV (postoperative nausea and vomiting)    sick with tonsillectomy  . 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 Thyroid disease     Past Surgical History:  Procedure Laterality Date  . ABDOMINAL HYSTERECTOMY    . BACK SURGERY  2007   Lumbar Surgery  . BREAST CYST ASPIRATION Right    Around 10 years ago. Pt thinks it was the right breast but not sure.  Marland Kitchen CATARACT EXTRACTION Left 2010   Right Eye 2011  . COLONOSCOPY WITH PROPOFOL N/A 09/02/2015   Procedure: COLONOSCOPY WITH PROPOFOL;  Surgeon: [name], MD;  Location: [facility];  Service: Endoscopy;  Laterality: N/A;  Diabetic - oral meds  . ESOPHAGOGASTRODUODENOSCOPY (EGD) WITH PROPOFOL N/A 11/03/2015   Procedure: ESOPHAGOGASTRODUODENOSCOPY (EGD) WITH  PROPOFOL;  Surgeon: [name], MD;  Location: [facility];  Service: Endoscopy;  Laterality: N/A;  . EYE SURGERY Bilateral 06/2014    tear duck catherization ( Dr. Vallarie Mare)  . LAPAROSCOPIC APPENDECTOMY  11/26/2018   Procedure: APPENDECTOMY LAPAROSCOPIC;  Surgeon: Ward, Honor Loh, MD;  Location: [facility];  Service: Gynecology;;  . POLYPECTOMY  09/02/2015   Procedure: POLYPECTOMY;  Surgeon: [name], MD;  Location: [facility];  Service: Endoscopy;;  . TONSILLECTOMY  1963    There were no vitals filed for this visit.  Subjective Assessment - 05/19/19 1310    Subjective  Patient states she feels like she's able to dorsiflex with less effort. Patient states she continues to have difficulty with asending/descending the stairs.    Pertinent History  CA history, radiation/chemo treatments, neuropathy into the feet    Limitations  Walking    How long can you walk comfortably?  Increased falls    Diagnostic tests  not performed    Patient Stated Goals  To decrease walking quality    Currently in Pain?  No/denies         TREATMENT  Therapeutic Exercise Seated dorsiflexion with 3# weight drop set down to 1# -- 3 x 20  Stairs acsending/desending the stairs without UE support -  x 6  Step downs forward off of 6" step with B UE support - x 9 B Sit to stand - x10 with focus on improving speed B Sit to stand with one LE in front to bias the behind LE--x10 Lunges in standing - x 15 with UE support  Monster walks in standing - 3 x 57ft with RTB around knees Running man in standing - x 15  Performed exercises to improve hip strength      PT Education - 05/19/19 1313    Education Details  form/technique with exercise    Person(s) Educated  Patient    Methods  Explanation;Demonstration    Comprehension  Verbalized understanding;Returned demonstration       PT Short Term Goals - 03/18/19 1708      PT SHORT TERM GOAL #1   Title  Patient will be independent with HEP  to continue benefits of therapy after discharge.    Baseline  Dependent with form/technique    Time  6    Period  Weeks    Status  New    Target Date  04/29/19        PT Long Term Goals - 05/06/19 1425      PT LONG TERM GOAL #1   Title  Patient will improve 5xSTS to under 12 sec to decrease fall risk and ability to stand from low surfaces    Baseline  5XSTS: 25sec. 03/18/19: 15.4sec; 04/07/2019: 16.5sec. 04/09/2019: 19.38 sec;  05/06/2019: 12.84sec    Time  6    Period  Weeks    Status  On-going      PT LONG TERM GOAL #2   Title  Patient will improve TUG to under 8 sec to indicate decrease fall risks most notably with transferring to amb.    Baseline  TUG: 12.5sec, 03/18/19: 12.46 seconds; 04/07/2019: Deferred. 04/09/2019: 14.26 sec;  05/06/2019: 10.88 sec    Time  6    Period  Weeks    Status  On-going      PT LONG TERM GOAL #3   Title  Patient will improve ability to perform 62mWT to over 1 m/s to indicate increased safety with community amb.    Baseline  .8 m/s, 03/18/19: .828m/s; 04/07/2019: Deferred. 04/09/2019: 0.64m/s; 05/06/2019: 1.13 m/s    Time  6    Period  Weeks    Status  Achieved      PT LONG TERM GOAL #4   Title  Patient will improve FGA to 27 points to indicate improvement with functional and dynamic gait with exercise.    Baseline  20/30 FGA, 03/18/19 FGA: 18 /30; 2/23/20221: Deferred. 04/09/2019: 20/30; 05/06/2019: 25/30    Time  6    Period  Weeks    Status  On-going      PT LONG TERM GOAL #5   Title  Patient will improve her FOTO score to 77 to indicate significant improvement with her balance.    Baseline  55 FOTO, 03/18/19: 66; 04/07/2019: Defered. 04/09/2019: 58; 05/06/2019: 61    Time  6    Period  Weeks    Status  On-going      PT LONG TERM GOAL #6   Title  Patient will be able to achieve a 4/5 for DF MMT in order for her to ambulate around the home with decreased risk of falling    Baseline  04/09/19: 2-/5 MMT for DF;  05/06/2019: 2/5    Time  6    Period  Weeks     Status  On-going            Plan - 05/19/19 1325    Clinical Impression Statement  Continued to focus on improving strengthening but focused more on strengthening without narrow BOS such as with lunges and and favoring one LE versus the other. Patient is improving overall, requiring less assistance with the stairs (performed with supervision assitance versus CGA in previous sessions). Patient is improving overall but still has cases of LOB where sherequires to stop and step oput to correct. Patient will benefit from further skilled therapy to return to prior level of function.    Personal Factors and Comorbidities  Age;Comorbidity 3+;Past/Current Experience;Other    Comorbidities  hx of CA, neuropathy, drop foot    Examination-Activity Limitations  Carry;Stairs;Stand;Bend    Examination-Participation Restrictions  Cleaning;Community Activity;Yard Work    [employer]chant [employer] officer  Evolving/Moderate complexity    Rehab Potential  Fair    PT Frequency  2x / week    PT Duration  6 weeks    PT Treatment/Interventions  Balance training;Neuromuscular re-education;Functional mobility training;Therapeutic activities;Gait training;Stair training;Electrical Stimulation;Manual techniques;Energy conservation;Patient/family education;Therapeutic exercise    PT Next Visit Plan  Hip strength, dyanmic balance, Squats, low functioning glute med exercises    PT Home Exercise Plan  Sit to stands; SLS; hip abductor strength    Consulted and Agree with Plan of Care  Patient       Patient will benefit from skilled therapeutic intervention in order to improve the following deficits and impairments:  Decreased coordination, Decreased mobility, Decreased endurance, Decreased range of motion, Decreased activity tolerance, Decreased strength, Decreased balance, Difficulty walking, Pain, Abnormal gait  Visit Diagnosis: Difficulty in walking, not elsewhere classified  Foot drop, right     Problem  List Patient Active Problem List   Diagnosis Date Noted  . Bilateral primary ovarian cancer ([place]) 02/02/2019  . Genetic testing 12/09/2018  . Status post hysterectomy 11/26/2018  . Need for prophylactic vaccination and inoculation against influenza 11/04/2018  . Anemia of chronic disease 10/22/2018  . B12 deficiency 10/22/2018  . Family history of lung cancer   . Serous adenocarcinoma ([place]) 08/13/2018  . Goals of care, counseling/discussion 08/11/2018  . 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 Loss of weight   . Benign neoplasm of descending colon   . Type 2 diabetes mellitus without complication, without long-term current use of insulin ([place]) 06/07/2015  . 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 Cyst of ovary 06/09/2014  . Bulky or enlarged uterus 06/09/2014  . Hypercholesteremia 02/09/2007  . Allergic rhinitis 11/27/2006  . Endometriosis 11/27/2006  . Adult hypothyroidism 11/27/2006    Blythe Stanford, PT DPT 05/19/2019, 1:36 PM  [facility] [address], [place], [address] Phone: [phone]   Fax:  [phone]  Name: SHADAYA GRABLE MRN: [id number] Date of Birth: 28-May-1955

## 2019-05-21 NOTE — Progress Notes (Signed)
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 She presented with symptoms of bilateral lower extremity swelling as well as abdominal distention.GI. This was followed by a CT abdomen and chest imaging with contrast. CT showed cystic lesions of the enlarged bilateral ovaries the largest lesion on the left measuring 6.2 cm. Patient also noted to have numerous bulky retroperitoneal lymph nodes, largest left periaortic node or conglomerates near the superior mesenteric artery measuring at least 4.4 x 2.9 cm. Significant ascites was present. Patient underwent paracentesis and cytology from the fluid showed high-grade serous carcinoma. Tumor cells were positive for CK7, PAX 8, p53 and WT 1.  Patient started neoadjuvant carbotaxol and Avastin chemotherapy on 08/21/2018 and completed 4 cycles on 11/02/2018. Patient did not receive Avastin for cycle 4.  Pathology: DIAGNOSIS:  A. UTERUS AND CERVIX WITH BILATERAL FALLOPIAN TUBES AND OVARIES;  HYSTERECTOMY WITH BILATERAL SALPINGO-OOPHORECTOMY:  - HIGH-GRADE SEROUS ADENOCARCINOMA.  - REFER TO CANCER SUMMARY BELOW.   CANCER CASE SUMMARY: OVARY or FALLOPIAN TUBE or PRIMARY PERITONEUM  Procedure: Total hysterectomy with bilateral salpingo-oophorectomy  Specimen Integrity: Intact  Tumor Site: Bilateral ovaries and fallopian tubes   Ovarian Surface Involvement: Present, bilateral  Fallopian Tube Surface Involvement: Present, left  Tumor Size: Greatest dimension 1.2 cm  Histologic Type: Serous carcinoma  Histologic Grade: High grade  Implants: N/A  Other Tissue/ Organ Involvement: Appendix, parametrium  Largest Extrapelvic Peritoneal Focus: Microscopic  Peritoneal/Ascitic Fluid: Positive for malignancy  Treatment Effect: Moderate response; see comment.  Regional Lymph Nodes: No nodes submitted or found  Pathologic Stage Classification (pTNM, AJCC 8th Edition): pT3a pNX pM1b  FIGO: IVB  TNM Descriptors: N/A   Comment:  Chemotherapy Response Score (CRS) is assigned based on evaluation of tumor response in the omentum, which is not submitted in this specimen. In the submitted tissue, there is a partial/moderate tumor response.   B. APPENDIX, APPENDECTOMY:  - TRANSMURAL INVOLVEMENT BY HIGH-GRADE SEROUS ADENOCARCINOMA.  Patient then completed 3 cycles of carbotaxol adjuvantly and is currently on maintenancemvasi  Interval history-patient reports stable appetite.  Denies any weight loss.  Denies any abdominal bloating or pain.  Neuropathy in her right foot is stable and she is working with physical therapy.   ECOG PS- 1 Pain scale- 0  Review of systems- Review of Systems  Constitutional: Positive for malaise/fatigue. Negative for chills, fever and weight loss.  HENT: Negative for congestion, ear discharge and nosebleeds.   Eyes: Negative for blurred vision.  Respiratory: Negative for cough, hemoptysis, sputum production, shortness of breath and wheezing.   Cardiovascular: Negative for chest pain, palpitations, orthopnea and claudication.  Gastrointestinal: Negative for abdominal pain, blood in stool, constipation, diarrhea, heartburn, melena, nausea and vomiting.  Genitourinary: Negative for dysuria, flank pain, frequency, hematuria and urgency.  Musculoskeletal: Negative for back pain, joint pain and myalgias.   Skin: Negative for rash.  Neurological: Negative for dizziness, tingling, focal weakness, seizures, weakness and headaches.  Endo/Heme/Allergies: Does not bruise/bleed easily.  Psychiatric/Behavioral: Negative for depression and suicidal ideas. The patient does not have insomnia.        Allergies  Allergen Reactions  . Clarithromycin Nausea Only  . Doxycycline Nausea Only    dizziness  . Penicillins Hives    Did it involve swelling of the face/tongue/throat, SOB, or low BP? No Did it involve sudden or severe rash/hives, skin peeling, or any reaction on the inside of your mouth or nose? No Did you need to seek medical attention at a hospital or doctor's office? No When did it last happen?50+ years ago If all above answers are "NO", may proceed with cephalosporin use.      Past Medical History:  Diagnosis Date  . Allergy   . Anemia   . Complication of anesthesia   . Diabetes mellitus without complication ([place])   . Diverticulosis   . Family history of adverse reaction to anesthesia    mom-n/v  . Family history of lung cancer   . GERD (gastroesophageal reflux disease)    h/o  . Hyperlipidemia   . Hypothyroidism   . Ovarian cancer ([place]) 08/04/2018   Chemo tx's.  . Personal history of chemotherapy   . PONV (postoperative nausea and vomiting)    sick with tonsillectomy  . 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 Thyroid disease      Past Surgical History:  Procedure Laterality Date  . ABDOMINAL HYSTERECTOMY    . BACK SURGERY  2007   Lumbar Surgery  . BREAST CYST ASPIRATION Right    Around 10 years ago. Pt thinks it was the right breast but not sure.  Marland Kitchen CATARACT EXTRACTION Left 2010   Right Eye 2011  . COLONOSCOPY WITH PROPOFOL N/A 09/02/2015   Procedure: COLONOSCOPY WITH PROPOFOL;  Surgeon: [name], MD;  Location: [facility];  Service: Endoscopy;  Laterality: N/A;  Diabetic - oral meds  . ESOPHAGOGASTRODUODENOSCOPY (EGD) WITH PROPOFOL N/A  11/03/2015   Procedure: ESOPHAGOGASTRODUODENOSCOPY (EGD) WITH PROPOFOL;  Surgeon: [name], MD;  Location: [facility];  Service: Endoscopy;  Laterality: N/A;  . EYE SURGERY Bilateral 06/2014    tear duck catherization ( Dr. Vallarie Mare)  . LAPAROSCOPIC APPENDECTOMY  11/26/2018   Procedure: APPENDECTOMY LAPAROSCOPIC;  Surgeon: Ward, Honor Loh, MD;  Location: [facility];  Service: Gynecology;;  . POLYPECTOMY  09/02/2015   Procedure: POLYPECTOMY;  Surgeon: [name], MD;  Location: [facility];  Service: Endoscopy;;  . TONSILLECTOMY  1963    Social History   Socioeconomic History  . Marital status: Single    Spouse name: Not on file  . Number of children: Not on file  . Years of education: Not on file  . Highest education level: Not on file  Occupational History  . Not on file  Tobacco Use  . Smoking status: Never Smoker  . Smokeless tobacco: Never Used  Substance and Sexual Activity  . Alcohol use: No  . Drug use: No  . Sexual activity: Not Currently  Other Topics Concern  . Not on file  Social History Narrative  . Not on file   Social Determinants of Health   Financial Resource Strain:   . Difficulty of Paying Living Expenses:   Food Insecurity:   . Worried About Charity fundraiser in the Last Year:   . Arboriculturist in the Last Year:   Transportation Needs:   . Film/video editor (Medical):   Marland Kitchen Lack of Transportation (Non-Medical):   Physical Activity:   .  Days of Exercise per Week:   . Minutes of Exercise per Session:   Stress:   . Feeling of Stress :   Social Connections:   . Frequency of Communication with Friends and Family:   . Frequency of Social Gatherings with Friends and Family:   . Attends Religious Services:   . Active Member of Clubs or Organizations:   . Attends Archivist Meetings:   Marland Kitchen Marital Status:   Intimate Partner Violence:   . Fear of Current or Ex-Partner:   . Emotionally Abused:   Marland Kitchen Physically Abused:    . Sexually Abused:     Family History  Problem Relation Age of Onset  . Congestive Heart Failure Mother   . Diabetes Mother   . Stroke Mother   . Hypothyroidism Father   . Cancer Father        lung cancer  . Diabetes Father   . Cancer Paternal Grandmother        stomach  . Breast cancer Neg Hx      Current Outpatient Medications:  .  aspirin 81 MG tablet, Take 81 mg by mouth daily. , Disp: , Rfl:  .  dexamethasone (DECADRON) 4 MG tablet, Take 2 tablets (8 mg total) by mouth daily. Start the day after carboplatin chemotherapy for 3 days., Disp: 30 tablet, Rfl: 1 .  fexofenadine (ALLEGRA) 60 MG tablet, Take 60 mg by mouth daily as needed for allergies. , Disp: , Rfl:  .  furosemide (LASIX) 20 MG tablet, Take 1 tablet (20 mg total) by mouth daily., Disp: 20 tablet, Rfl: 0 .  gabapentin (NEURONTIN) 300 MG capsule, Take 1 capsule (300 mg total) by mouth at bedtime. Take 2 tablets at bedtime once and then 3 tablets for three nights at bedtime., Disp: 11 capsule, Rfl: 0 .  glipiZIDE (GLUCOTROL) 5 MG tablet, TAKE 1 TABLET BY MOUTH DAILY BEFORE BREAKFAST (Patient taking differently: Take 5 mg by mouth daily before breakfast. ), Disp: 90 tablet, Rfl: 1 .  ibuprofen (ADVIL) 600 MG tablet, Take 1 tablet (600 mg total) by mouth every 6 (six) hours., Disp: 45 tablet, Rfl: 1 .  levothyroxine (SYNTHROID) 100 MCG tablet, Take 1 tablet (100 mcg total) by mouth daily., Disp: 90 tablet, Rfl: 1 .  LORazepam (ATIVAN) 0.5 MG tablet, Take 1 tablet (0.5 mg total) by mouth every 6 (six) hours as needed (Nausea or vomiting)., Disp: 30 tablet, Rfl: 0 .  metFORMIN (GLUCOPHAGE) 500 MG tablet, Take 2 tablets (1,000 mg total) by mouth 2 (two) times daily., Disp: 360 tablet, Rfl: 1 .  MULTIPLE VITAMIN PO, Take 1 tablet by mouth daily. , Disp: , Rfl:  .  ONETOUCH DELICA LANCETS 35T MISC, USE TO TEST BLOOD SUGAR ONCE A DAY, Disp: 100 each, Rfl: 12 .  prochlorperazine (COMPAZINE) 10 MG tablet, Take 1 tablet (10 mg  total) by mouth every 6 (six) hours as needed (Nausea or vomiting)., Disp: 30 tablet, Rfl: 1 .  simvastatin (ZOCOR) 20 MG tablet, Take 1 tablet (20 mg total) by mouth at bedtime. Please schedule office visit before any future refills, Disp: 90 tablet, Rfl: 0 .  XIIDRA 5 % SOLN, , Disp: , Rfl:  .  acetaminophen (TYLENOL) 500 MG tablet, Take 2 tablets (1,000 mg total) by mouth every 6 (six) hours. (Patient not taking: Reported on 05/18/2019), Disp: , Rfl:  .  calcipotriene (DOVONOX) 0.005 % ointment, Apply 1 application topically as needed. CALCIPOTRIENE, 0.005% (External Ointment) - Historical Medication  apply  to skin daily (0.005 %) Active, Disp: , Rfl:  .  glucose blood (ONE TOUCH ULTRA TEST) test strip, USE TO TEST BLOOD SUGAR TWICE A DAY AS INSTRUCTED (Patient not taking: Reported on 05/18/2019), Disp: 100 each, Rfl: 12 .  insulin degludec (TRESIBA FLEXTOUCH) 100 UNIT/ML SOPN FlexTouch Pen, Inject 15 Units into the skin daily., Disp: , Rfl:  .  ondansetron (ZOFRAN) 8 MG tablet, Take 1 tablet (8 mg total) by mouth 2 (two) times daily as needed for refractory nausea / vomiting. Start on day 3 after carboplatin chemo. (Patient not taking: Reported on 05/18/2019), Disp: 30 tablet, Rfl: 1 .  oxyCODONE (ROXICODONE) 5 MG immediate release tablet, Take 1 tablet (5 mg total) by mouth every 8 (eight) hours as needed. (Patient not taking: Reported on 05/18/2019), Disp: 16 tablet, Rfl: 0 .  spironolactone (ALDACTONE) 50 MG tablet, Take 1 tablet (50 mg total) by mouth daily., Disp: 20 tablet, Rfl: 0 No current facility-administered medications for this visit.  Facility-Administered Medications Ordered in Other Visits:  .  sodium chloride flush (NS) 0.9 % injection 10 mL, 10 mL, Intravenous, Once, Sindy Guadeloupe, MD .  sodium chloride flush (NS) 0.9 % injection 10 mL, 10 mL, Intravenous, Once, Sindy Guadeloupe, MD .  sodium chloride flush (NS) 0.9 % injection 10 mL, 10 mL, Intravenous, Once, Sindy Guadeloupe, MD   Physical exam:  Vitals:   05/18/19 0923  BP: 138/83  Pulse: 79  Resp: 18  Temp: 97.7 F (36.5 C)  TempSrc: Tympanic  SpO2: 100%  Weight: 110 lb (49.9 kg)   Physical Exam Constitutional:      Comments: Patient is thin.  Appears in no acute distress  HENT:     Head: Normocephalic and atraumatic.  Eyes:     Pupils: Pupils are equal, round, and reactive to light.  Cardiovascular:     Rate and Rhythm: Normal rate and regular rhythm.     Heart sounds: Normal heart sounds.  Pulmonary:     Effort: Pulmonary effort is normal.     Breath sounds: Normal breath sounds.  Abdominal:     General: Bowel sounds are normal. There is no distension.     Palpations: Abdomen is soft.     Tenderness: There is no abdominal tenderness.  Musculoskeletal:     Cervical back: Normal range of motion.  Skin:    General: Skin is warm and dry.  Neurological:     Mental Status: She is alert and oriented to person, place, and time.      CMP Latest Ref Rng & Units 05/18/2019  Glucose 70 - 99 mg/dL 157(H)  BUN 8 - 23 mg/dL 19  Creatinine 0.44 - 1.00 mg/dL 0.64  Sodium 135 - 145 mmol/L 132(L)  Potassium 3.5 - 5.1 mmol/L 4.6  Chloride 98 - 111 mmol/L 94(L)  CO2 22 - 32 mmol/L 27  Calcium 8.9 - 10.3 mg/dL 9.9  Total Protein 6.5 - 8.1 g/dL 7.2  Total Bilirubin 0.3 - 1.2 mg/dL 0.4  Alkaline Phos 38 - 126 U/L 44  AST 15 - 41 U/L 32  ALT 0 - 44 U/L 29   CBC Latest Ref Rng & Units 05/18/2019  WBC 4.0 - 10.5 K/uL 5.7  Hemoglobin 12.0 - 15.0 g/dL 12.1  Hematocrit 36.0 - 46.0 % 34.7(L)  Platelets 150 - 400 K/uL 165      Assessment and plan- Patient is a 64 y.o. 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 Avastin was held for cycle 4. She is s/p robotic assisted total laparoscopic hysterectomy and bilateral salpingo-oophorectomy and appendectomy with extensive lysis of adhesions and abdomen  debulking.She has completed 6 cycles of carbotaxol chemotherapy ( 4neoadjuvantly and2adjuvantly).She is here for on treatment assessment prior to cycle 6 of maintenance Mvasi chemotherapy    Counts okay to proceed with cycle 6 of maintenance Mvasi chemotherapy today.  Patient's blood pressure is stable and urine protein remains trace.  Her CA-125 level is increasing from a prior value of 11.8 in February to 50.6 in March 74.5 presently.  I will therefore proceed with CT chest abdomen and pelvis at this time to rule out any recurrent disease.    If scans show stable the plan is to continue Mvasi until October 2021 which will be month 15   Visit Diagnosis 1. Encounter for monoclonal antibody treatment for malignancy   2. Primary high grade serous adenocarcinoma of ovary ([place])   3. Chemotherapy-induced peripheral neuropathy ([place])      Dr. Randa Evens, MD, MPH [facility] [phone] 05/21/2019 4:53 AM

## 2019-05-25 ENCOUNTER — Other Ambulatory Visit: Payer: Self-pay

## 2019-05-25 ENCOUNTER — Ambulatory Visit: Payer: Commercial Managed Care - PPO

## 2019-05-25 DIAGNOSIS — M21371 Foot drop, right foot: Secondary | ICD-10-CM

## 2019-05-25 DIAGNOSIS — R262 Difficulty in walking, not elsewhere classified: Secondary | ICD-10-CM | POA: Diagnosis not present

## 2019-05-25 NOTE — Therapy (Signed)
[facility] [address] [place], [address] Phone: [phone]   Fax:  [phone]  Physical Therapy Treatment  Patient Details  Name: Meredith Jones MRN: [id number] Date of Birth: 10/06/1955 Referring Provider (PT): Janese Banks MD   Encounter Date: 05/25/2019  PT End of Session - 05/25/19 1441    Visit Number  23    Number of Visits  30    Date for PT Re-Evaluation  06/17/19    Authorization Type  3/10    PT Start Time  1430    PT Stop Time  1515    PT Time Calculation (min)  45 min    Equipment Utilized During Treatment  Gait belt    Activity Tolerance  Patient tolerated treatment well    Behavior During Therapy  [facility] for tasks assessed/performed       Past Medical History:  Diagnosis Date  . Allergy   . Anemia   . Complication of anesthesia   . Diabetes mellitus without complication ([place])   . Diverticulosis   . Family history of adverse reaction to anesthesia    mom-n/v  . Family history of lung cancer   . GERD (gastroesophageal reflux disease)    h/o  . Hyperlipidemia   . Hypothyroidism   . Ovarian cancer ([place]) 08/04/2018   Chemo tx's.  . Personal history of chemotherapy   . PONV (postoperative nausea and vomiting)    sick with tonsillectomy  . 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 Thyroid disease     Past Surgical History:  Procedure Laterality Date  . ABDOMINAL HYSTERECTOMY    . BACK SURGERY  2007   Lumbar Surgery  . BREAST CYST ASPIRATION Right    Around 10 years ago. Pt thinks it was the right breast but not sure.  Marland Kitchen CATARACT EXTRACTION Left 2010   Right Eye 2011  . COLONOSCOPY WITH PROPOFOL N/A 09/02/2015   Procedure: COLONOSCOPY WITH PROPOFOL;  Surgeon: [name], MD;  Location: [facility];  Service: Endoscopy;  Laterality: N/A;  Diabetic - oral meds  . ESOPHAGOGASTRODUODENOSCOPY (EGD) WITH PROPOFOL N/A 11/03/2015   Procedure: ESOPHAGOGASTRODUODENOSCOPY (EGD) WITH  PROPOFOL;  Surgeon: [name], MD;  Location: [facility];  Service: Endoscopy;  Laterality: N/A;  . EYE SURGERY Bilateral 06/2014    tear duck catherization ( Dr. Vallarie Mare)  . LAPAROSCOPIC APPENDECTOMY  11/26/2018   Procedure: APPENDECTOMY LAPAROSCOPIC;  Surgeon: Ward, Honor Loh, MD;  Location: [facility];  Service: Gynecology;;  . POLYPECTOMY  09/02/2015   Procedure: POLYPECTOMY;  Surgeon: [name], MD;  Location: [facility];  Service: Endoscopy;;  . TONSILLECTOMY  1963    There were no vitals filed for this visit.  Subjective Assessment - 05/25/19 1438    Subjective  Patient reports she has been wearing AFO when walking for long periods of time. Patient states asending/desending the stais continues to be challenging.    Pertinent History  CA history, radiation/chemo treatments, neuropathy into the feet    Limitations  Walking    How long can you walk comfortably?  Increased falls    Diagnostic tests  not performed    Patient Stated Goals  To decrease walking quality    Currently in Pain?  No/denies          TREATMENT   Therapeutic Exercise Seated dorsiflexion with 3# weight drop set down to 2# -- 2 x 20; x 20 Sit to stand - x20  with focus on improving speed B Step ups with high knee and no touh down on step with UE support - x 20 B Sit to stand with one LE in front to bias the behind LE--x10 B Jumps with B UE support - 2 x5  Agility training walking quickly 5 feet/walking backwards quickly 5 ft in 3 dirctions ~ 20degree from each other - x 8  Square stepping in a circular pattern - 2 x 10 (4 cones ~5 ft in distance from each other) cw/CCW   Performed exercises to improve hip strength , and power to address dynamic balance        PT Education - 05/25/19 1441    Education Details  form/technique with exercise    Person(s) Educated  Patient    Methods  Explanation;Demonstration    Comprehension  Verbalized understanding;Returned demonstration        PT Short Term Goals - 03/18/19 1708      PT SHORT TERM GOAL #1   Title  Patient will be independent with HEP to continue benefits of therapy after discharge.    Baseline  Dependent with form/technique    Time  6    Period  Weeks    Status  New    Target Date  04/29/19        PT Long Term Goals - 05/06/19 1425      PT LONG TERM GOAL #1   Title  Patient will improve 5xSTS to under 12 sec to decrease fall risk and ability to stand from low surfaces    Baseline  5XSTS: 25sec. 03/18/19: 15.4sec; 04/07/2019: 16.5sec. 04/09/2019: 19.38 sec;  05/06/2019: 12.84sec    Time  6    Period  Weeks    Status  On-going      PT LONG TERM GOAL #2   Title  Patient will improve TUG to under 8 sec to indicate decrease fall risks most notably with transferring to amb.    Baseline  TUG: 12.5sec, 03/18/19: 12.46 seconds; 04/07/2019: Deferred. 04/09/2019: 14.26 sec;  05/06/2019: 10.88 sec    Time  6    Period  Weeks    Status  On-going      PT LONG TERM GOAL #3   Title  Patient will improve ability to perform 62mWT to over 1 m/s to indicate increased safety with community amb.    Baseline  .8 m/s, 03/18/19: .867m/s; 04/07/2019: Deferred. 04/09/2019: 0.44m/s; 05/06/2019: 1.13 m/s    Time  6    Period  Weeks    Status  Achieved      PT LONG TERM GOAL #4   Title  Patient will improve FGA to 27 points to indicate improvement with functional and dynamic gait with exercise.    Baseline  20/30 FGA, 03/18/19 FGA: 18 /30; 2/23/20221: Deferred. 04/09/2019: 20/30; 05/06/2019: 25/30    Time  6    Period  Weeks    Status  On-going      PT LONG TERM GOAL #5   Title  Patient will improve her FOTO score to 77 to indicate significant improvement with her balance.    Baseline  55 FOTO, 03/18/19: 66; 04/07/2019: Defered. 04/09/2019: 58; 05/06/2019: 61    Time  6    Period  Weeks    Status  On-going      PT LONG TERM GOAL #6   Title  Patient will be able to achieve a 4/5 for DF MMT in order for her to ambulate around the home  with decreased risk of falling    Baseline  04/09/19: 2-/5 MMT for DF;  05/06/2019: 2/5    Time  6    Period  Weeks    Status  On-going            Plan - 05/25/19 1442    Clinical Impression Statement  Continued to focus on improving balance and strengthening limitations in standing during today's session to decrease fall risk and improving ambulation mobility. Continues to have difficulty with stepping ability without UE support indicating poor dynamic balance and eccentric musuclar control. Patient is improving overall and will benefit from furhter skilled therapy to return to prior level of function.    Personal Factors and Comorbidities  Age;Comorbidity 3+;Past/Current Experience;Other    Comorbidities  hx of CA, neuropathy, drop foot    Examination-Activity Limitations  Carry;Stairs;Stand;Bend    Examination-Participation Restrictions  Cleaning;Community Activity;Yard Work    Merchant navy officer  Evolving/Moderate complexity    Rehab Potential  Fair    PT Frequency  2x / week    PT Duration  6 weeks    PT Treatment/Interventions  Balance training;Neuromuscular re-education;Functional mobility training;Therapeutic activities;Gait training;Stair training;Electrical Stimulation;Manual techniques;Energy conservation;Patient/family education;Therapeutic exercise    PT Next Visit Plan  Hip strength, dyanmic balance, Squats, low functioning glute med exercises    PT Home Exercise Plan  Sit to stands; SLS; hip abductor strength    Consulted and Agree with Plan of Care  Patient       Patient will benefit from skilled therapeutic intervention in order to improve the following deficits and impairments:  Decreased coordination, Decreased mobility, Decreased endurance, Decreased range of motion, Decreased activity tolerance, Decreased strength, Decreased balance, Difficulty walking, Pain, Abnormal gait  Visit Diagnosis: Difficulty in walking, not elsewhere classified  Foot  drop, right     Problem List Patient Active Problem List   Diagnosis Date Noted  . Bilateral primary ovarian cancer ([place]) 02/02/2019  . Genetic testing 12/09/2018  . Status post hysterectomy 11/26/2018  . Need for prophylactic vaccination and inoculation against influenza 11/04/2018  . Anemia of chronic disease 10/22/2018  . B12 deficiency 10/22/2018  . Family history of lung cancer   . Serous adenocarcinoma ([place]) 08/13/2018  . Goals of care, counseling/discussion 08/11/2018  . Primary high grade serous adenocarcinoma of ovary ([place]) 08/11/2018  . Loss of weight   . Benign neoplasm of descending colon   . Type 2 diabetes mellitus without complication, without long-term current use of insulin ([place]) 06/07/2015  . 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 Cyst of ovary 06/09/2014  . Bulky or enlarged uterus 06/09/2014  . Hypercholesteremia 02/09/2007  . Allergic rhinitis 11/27/2006  . Endometriosis 11/27/2006  . Adult hypothyroidism 11/27/2006    Blythe Stanford, PT DPT  05/25/2019, 3:09 PM  [facility] [address] [place], [address] Phone: [phone]   Fax:  [phone]  Name: BERNETT RAYSOR MRN: [id number] Date of Birth: October 10, 1955

## 2019-05-28 ENCOUNTER — Other Ambulatory Visit: Payer: Self-pay

## 2019-05-28 ENCOUNTER — Ambulatory Visit: Payer: Commercial Managed Care - PPO

## 2019-05-28 DIAGNOSIS — R262 Difficulty in walking, not elsewhere classified: Secondary | ICD-10-CM | POA: Diagnosis not present

## 2019-05-28 DIAGNOSIS — M21371 Foot drop, right foot: Secondary | ICD-10-CM

## 2019-05-28 NOTE — Therapy (Signed)
[facility] [address] [place], [address] Phone: [phone]   Fax:  [phone]  Physical Therapy Treatment  Patient Details  Name: Meredith Jones MRN: [id number] Date of Birth: 08/29/1955 Referring Provider (PT): Janese Banks MD   Encounter Date: 05/28/2019  PT End of Session - 05/28/19 1441    Visit Number  24    Number of Visits  30    Date for PT Re-Evaluation  06/17/19    Authorization Type  4/10    PT Start Time  1430    PT Stop Time  1515    PT Time Calculation (min)  45 min    Equipment Utilized During Treatment  Gait belt    Activity Tolerance  Patient tolerated treatment well    Behavior During Therapy  [facility] for tasks assessed/performed       Past Medical History:  Diagnosis Date  . Allergy   . Anemia   . Complication of anesthesia   . Diabetes mellitus without complication ([place])   . Diverticulosis   . Family history of adverse reaction to anesthesia    mom-n/v  . Family history of lung cancer   . GERD (gastroesophageal reflux disease)    h/o  . Hyperlipidemia   . Hypothyroidism   . Ovarian cancer ([place]) 08/04/2018   Chemo tx's.  . Personal history of chemotherapy   . PONV (postoperative nausea and vomiting)    sick with tonsillectomy  . 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 Thyroid disease     Past Surgical History:  Procedure Laterality Date  . ABDOMINAL HYSTERECTOMY    . BACK SURGERY  2007   Lumbar Surgery  . BREAST CYST ASPIRATION Right    Around 10 years ago. Pt thinks it was the right breast but not sure.  Marland Kitchen CATARACT EXTRACTION Left 2010   Right Eye 2011  . COLONOSCOPY WITH PROPOFOL N/A 09/02/2015   Procedure: COLONOSCOPY WITH PROPOFOL;  Surgeon: [name], MD;  Location: [place];  Service: Endoscopy;  Laterality: N/A;  Diabetic - oral meds  . ESOPHAGOGASTRODUODENOSCOPY (EGD) WITH PROPOFOL N/A 11/03/2015   Procedure: ESOPHAGOGASTRODUODENOSCOPY (EGD) WITH  PROPOFOL;  Surgeon: [name], MD;  Location: [place];  Service: Endoscopy;  Laterality: N/A;  . EYE SURGERY Bilateral 06/2014    tear duck catherization ( Dr. Vallarie Mare)  . LAPAROSCOPIC APPENDECTOMY  11/26/2018   Procedure: APPENDECTOMY LAPAROSCOPIC;  Surgeon: Ward, Honor Loh, MD;  Location: [facility];  Service: Gynecology;;  . POLYPECTOMY  09/02/2015   Procedure: POLYPECTOMY;  Surgeon: [name], MD;  Location: [place];  Service: Endoscopy;;  . TONSILLECTOMY  1963    There were no vitals filed for this visit.  Subjective Assessment - 05/28/19 1438    Subjective  Patient reports to continue to do well with AFO. Patient states she has been performing exercises and continues to have difficulty with asending/desending stairs.    Pertinent History  CA history, radiation/chemo treatments, neuropathy into the feet    Limitations  Walking    How long can you walk comfortably?  Increased falls    Diagnostic tests  not performed    Patient Stated Goals  To decrease walking quality    Currently in Pain?  No/denies          TREATMENT   Therapeutic Exercise Seated dorsiflexion with 3# weight drop set down to 5# -- x 20; x 20 Forward walking with focus on  improving quick and slow - 6 x 64ft  Side stepping over hurdles with focus  on improving speed - 6 hurdles - 3 x 62ft Forward stepping over hurdles with focus on improving speed - 6 hurdles - 3 x 36ft Sit to stand - 3x10 with focus on improving speed B 10# Side stepping on a 6" step - x 20  Cone taps from airex pad - 3 cones - x10 on each cone    Performed exercises to improve hip strength , and power to address dynamic balance    PT Education - 05/28/19 1440    Education Details  form/techniques with exercise    Person(s) Educated  Patient    Methods  Explanation;Demonstration    Comprehension  Verbalized understanding;Returned demonstration       PT Short Term Goals - 03/18/19 1708      PT SHORT TERM  GOAL #1   Title  Patient will be independent with HEP to continue benefits of therapy after discharge.    Baseline  Dependent with form/technique    Time  6    Period  Weeks    Status  New    Target Date  04/29/19        PT Long Term Goals - 05/06/19 1425      PT LONG TERM GOAL #1   Title  Patient will improve 5xSTS to under 12 sec to decrease fall risk and ability to stand from low surfaces    Baseline  5XSTS: 25sec. 03/18/19: 15.4sec; 04/07/2019: 16.5sec. 04/09/2019: 19.38 sec;  05/06/2019: 12.84sec    Time  6    Period  Weeks    Status  On-going      PT LONG TERM GOAL #2   Title  Patient will improve TUG to under 8 sec to indicate decrease fall risks most notably with transferring to amb.    Baseline  TUG: 12.5sec, 03/18/19: 12.46 seconds; 04/07/2019: Deferred. 04/09/2019: 14.26 sec;  05/06/2019: 10.88 sec    Time  6    Period  Weeks    Status  On-going      PT LONG TERM GOAL #3   Title  Patient will improve ability to perform 52mWT to over 1 m/s to indicate increased safety with community amb.    Baseline  .8 m/s, 03/18/19: .876m/s; 04/07/2019: Deferred. 04/09/2019: 0.77m/s; 05/06/2019: 1.13 m/s    Time  6    Period  Weeks    Status  Achieved      PT LONG TERM GOAL #4   Title  Patient will improve FGA to 27 points to indicate improvement with functional and dynamic gait with exercise.    Baseline  20/30 FGA, 03/18/19 FGA: 18 /30; 2/23/20221: Deferred. 04/09/2019: 20/30; 05/06/2019: 25/30    Time  6    Period  Weeks    Status  On-going      PT LONG TERM GOAL #5   Title  Patient will improve her FOTO score to 77 to indicate significant improvement with her balance.    Baseline  55 FOTO, 03/18/19: 66; 04/07/2019: Defered. 04/09/2019: 58; 05/06/2019: 61    Time  6    Period  Weeks    Status  On-going      PT LONG TERM GOAL #6   Title  Patient will be able to achieve a 4/5 for DF MMT in order for her to ambulate around the home with decreased risk of falling    Baseline  04/09/19: 2-/5 MMT  for  DF;  05/06/2019: 2/5    Time  6    Period  Weeks    Status  On-going            Plan - 05/28/19 1503    Clinical Impression Statement  Focused today's session on improving LE strengthening and speed with movement. Patient does well with this however has difficulty with quickly excellerating to maintain balance in narrow BOS situations. Will continue to focuse on ipmroving these limitations to return to prior level of function.    Personal Factors and Comorbidities  Age;Comorbidity 3+;Past/Current Experience;Other    Comorbidities  hx of CA, neuropathy, drop foot    Examination-Activity Limitations  Carry;Stairs;Stand;Bend    Examination-Participation Restrictions  Cleaning;Community Activity;Yard Work    [employer]chant [employer] officer  Evolving/Moderate complexity    Rehab Potential  Fair    PT Frequency  2x / week    PT Duration  6 weeks    PT Treatment/Interventions  Balance training;Neuromuscular re-education;Functional mobility training;Therapeutic activities;Gait training;Stair training;Electrical Stimulation;Manual techniques;Energy conservation;Patient/family education;Therapeutic exercise    PT Next Visit Plan  Hip strength, dyanmic balance, Squats, low functioning glute med exercises    PT Home Exercise Plan  Sit to stands; SLS; hip abductor strength    Consulted and Agree with Plan of Care  Patient       Patient will benefit from skilled therapeutic intervention in order to improve the following deficits and impairments:  Decreased coordination, Decreased mobility, Decreased endurance, Decreased range of motion, Decreased activity tolerance, Decreased strength, Decreased balance, Difficulty walking, Pain, Abnormal gait  Visit Diagnosis: Difficulty in walking, not elsewhere classified  Foot drop, right     Problem List Patient Active Problem List   Diagnosis Date Noted  . Bilateral primary ovarian cancer ([place]) 02/02/2019  . Genetic testing 12/09/2018  .  Status post hysterectomy 11/26/2018  . Need for prophylactic vaccination and inoculation against influenza 11/04/2018  . Anemia of chronic disease 10/22/2018  . B12 deficiency 10/22/2018  . Family history of lung cancer   . 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 Goals of care, counseling/discussion 08/11/2018  . 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 Loss of weight   . Benign neoplasm of descending colon   . Type 2 diabetes mellitus without complication, without long-term current use of insulin ([place]) 06/07/2015  . 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 Cyst of ovary 06/09/2014  . Bulky or enlarged uterus 06/09/2014  . Hypercholesteremia 02/09/2007  . Allergic rhinitis 11/27/2006  . Endometriosis 11/27/2006  . Adult hypothyroidism 11/27/2006    Blythe Stanford, PT DPT 05/28/2019, 3:05 PM  [facility] [address] [place], [address] Phone: [phone]   Fax:  [phone]  Name: Meredith Jones MRN: [id number] Date of Birth: January 18, 1956

## 2019-06-01 ENCOUNTER — Ambulatory Visit
Admission: RE | Admit: 2019-06-01 | Discharge: 2019-06-01 | Disposition: A | Discharge status: Home / Self Care | Payer: Commercial Managed Care - PPO | Source: Ambulatory Visit | Attending: Oncology | Admitting: Oncology

## 2019-06-01 ENCOUNTER — Other Ambulatory Visit: Payer: Self-pay

## 2019-06-01 DIAGNOSIS — C569 Malignant neoplasm of unspecified ovary: Secondary | ICD-10-CM

## 2019-06-01 MED ORDER — IOHEXOL 300 MG/ML  SOLN
85.0000 mL | Freq: Once | INTRAMUSCULAR | Status: AC | PRN
  Administered 2019-06-01: 10:00:00 85 mL via INTRAVENOUS

## 2019-06-01 NOTE — Progress Notes (Signed)

## 2019-06-02 ENCOUNTER — Ambulatory Visit: Payer: Commercial Managed Care - PPO

## 2019-06-02 ENCOUNTER — Other Ambulatory Visit: Payer: Self-pay | Admitting: Physician Assistant

## 2019-06-02 DIAGNOSIS — E039 Hypothyroidism, unspecified: Secondary | ICD-10-CM

## 2019-06-02 DIAGNOSIS — R262 Difficulty in walking, not elsewhere classified: Secondary | ICD-10-CM | POA: Diagnosis not present

## 2019-06-02 DIAGNOSIS — M21371 Foot drop, right foot: Secondary | ICD-10-CM

## 2019-06-02 NOTE — Therapy (Signed)
[facility] [address], [place], [address] Phone: [phone]   Fax:  [phone]  Physical Therapy Treatment  Patient Details  Name: Meredith Jones MRN: [id number] Date of Birth: 1955/12/26 Referring Provider (PT): Janese Banks MD   Encounter Date: 06/02/2019  PT End of Session - 06/02/19 1311    Visit Number  25    Number of Visits  30    Date for PT Re-Evaluation  06/17/19    Authorization Type  5/10    PT Start Time  1300    PT Stop Time  1345    PT Time Calculation (min)  45 min    Equipment Utilized During Treatment  Gait belt    Activity Tolerance  Patient tolerated treatment well    Behavior During Therapy  [facility] for tasks assessed/performed       Past Medical History:  Diagnosis Date  . Allergy   . Anemia   . Complication of anesthesia   . Diabetes mellitus without complication ([place])   . Diverticulosis   . Family history of adverse reaction to anesthesia    mom-n/v  . Family history of lung cancer   . GERD (gastroesophageal reflux disease)    h/o  . Hyperlipidemia   . Hypothyroidism   . Ovarian cancer ([place]) 08/04/2018   Chemo tx's and Hysterectomy.  . Personal history of chemotherapy   . PONV (postoperative nausea and vomiting)    sick with tonsillectomy  . 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 Thyroid disease     Past Surgical History:  Procedure Laterality Date  . ABDOMINAL HYSTERECTOMY    . BACK SURGERY  2007   Lumbar Surgery  . BREAST CYST ASPIRATION Right    Around 10 years ago. Pt thinks it was the right breast but not sure.  Marland Kitchen CATARACT EXTRACTION Left 2010   Right Eye 2011  . COLONOSCOPY WITH PROPOFOL N/A 09/02/2015   Procedure: COLONOSCOPY WITH PROPOFOL;  Surgeon: [name], MD;  Location: [place] [place];  Service: Endoscopy;  Laterality: N/A;  Diabetic - oral meds  . ESOPHAGOGASTRODUODENOSCOPY (EGD) WITH PROPOFOL N/A 11/03/2015   Procedure:  ESOPHAGOGASTRODUODENOSCOPY (EGD) WITH PROPOFOL;  Surgeon: [name], MD;  Location: [place];  Service: Endoscopy;  Laterality: N/A;  . EYE SURGERY Bilateral 06/2014    tear duck catherization ( Dr. Vallarie Mare)  . LAPAROSCOPIC APPENDECTOMY  11/26/2018   Procedure: APPENDECTOMY LAPAROSCOPIC;  Surgeon: Ward, Honor Loh, MD;  Location: [facility];  Service: Gynecology;;  . POLYPECTOMY  09/02/2015   Procedure: POLYPECTOMY;  Surgeon: [name], MD;  Location: [place];  Service: Endoscopy;;  . TONSILLECTOMY  1963    There were no vitals filed for this visit.  Subjective Assessment - 06/02/19 1308    Subjective  Patient states she's been walking well and states it's improving overall. Patient reports no major changes otherwise.    Pertinent History  CA history, radiation/chemo treatments, neuropathy into the feet    Limitations  Walking    How long can you walk comfortably?  Increased falls    Diagnostic tests  not performed    Patient Stated Goals  To decrease walking quality    Currently in Pain?  No/denies           TREATMENT   Therapeutic Exercise Seated dorsiflexion with 5# -- x 20; x 20 Side stepping up and over BOSU ball - x 15 Hip abduction to extension in  standing with RTB - x 20 B Walking across balance stones in standing - x 10 with RTB around knees Side stepping along balance stones - x 20  Forward stepping over hurdles on airex beam - 6 hurdles - 3 x 36ft   Performed exercises to improve hip strength , and power to address dynamic balance   PT Education - 06/02/19 1310    Education Details  form/technique with exercise    Person(s) Educated  Patient    Methods  Explanation;Demonstration    Comprehension  Returned demonstration;Verbalized understanding       PT Short Term Goals - 03/18/19 1708      PT SHORT TERM GOAL #1   Title  Patient will be independent with HEP to continue benefits of therapy after discharge.    Baseline  Dependent with  form/technique    Time  6    Period  Weeks    Status  New    Target Date  04/29/19        PT Long Term Goals - 05/06/19 1425      PT LONG TERM GOAL #1   Title  Patient will improve 5xSTS to under 12 sec to decrease fall risk and ability to stand from low surfaces    Baseline  5XSTS: 25sec. 03/18/19: 15.4sec; 04/07/2019: 16.5sec. 04/09/2019: 19.38 sec;  05/06/2019: 12.84sec    Time  6    Period  Weeks    Status  On-going      PT LONG TERM GOAL #2   Title  Patient will improve TUG to under 8 sec to indicate decrease fall risks most notably with transferring to amb.    Baseline  TUG: 12.5sec, 03/18/19: 12.46 seconds; 04/07/2019: Deferred. 04/09/2019: 14.26 sec;  05/06/2019: 10.88 sec    Time  6    Period  Weeks    Status  On-going      PT LONG TERM GOAL #3   Title  Patient will improve ability to perform 71mWT to over 1 m/s to indicate increased safety with community amb.    Baseline  .8 m/s, 03/18/19: .816m/s; 04/07/2019: Deferred. 04/09/2019: 0.65m/s; 05/06/2019: 1.13 m/s    Time  6    Period  Weeks    Status  Achieved      PT LONG TERM GOAL #4   Title  Patient will improve FGA to 27 points to indicate improvement with functional and dynamic gait with exercise.    Baseline  20/30 FGA, 03/18/19 FGA: 18 /30; 2/23/20221: Deferred. 04/09/2019: 20/30; 05/06/2019: 25/30    Time  6    Period  Weeks    Status  On-going      PT LONG TERM GOAL #5   Title  Patient will improve her FOTO score to 77 to indicate significant improvement with her balance.    Baseline  55 FOTO, 03/18/19: 66; 04/07/2019: Defered. 04/09/2019: 58; 05/06/2019: 61    Time  6    Period  Weeks    Status  On-going      PT LONG TERM GOAL #6   Title  Patient will be able to achieve a 4/5 for DF MMT in order for her to ambulate around the home with decreased risk of falling    Baseline  04/09/19: 2-/5 MMT for DF;  05/06/2019: 2/5    Time  6    Period  Weeks    Status  On-going            Plan - 06/02/19 1317  Clinical  Impression Statement  Patient demonstrates improvement with dynamic balance with ability to perform walking along more compliant surfaces with less overall abberrant movements and less necessity to step out to maintain balance. Patient continues to demonstrate improved dorsiflexion activation with movement and patient will benefit from further skilled therapy focused on improving limitations to return to prior level of function.    Personal Factors and Comorbidities  Age;Comorbidity 3+;Past/Current Experience;Other    Comorbidities  hx of CA, neuropathy, drop foot    Examination-Activity Limitations  Carry;Stairs;Stand;Bend    Examination-Participation Restrictions  Cleaning;Community Activity;Yard Work    [employer] officer  Evolving/Moderate complexity    Rehab Potential  Fair    PT Frequency  2x / week    PT Duration  6 weeks    PT Treatment/Interventions  Balance training;Neuromuscular re-education;Functional mobility training;Therapeutic activities;Gait training;Stair training;Electrical Stimulation;Manual techniques;Energy conservation;Patient/family education;Therapeutic exercise    PT Next Visit Plan  Hip strength, dyanmic balance, Squats, low functioning glute med exercises    PT Home Exercise Plan  Sit to stands; SLS; hip abductor strength    Consulted and Agree with Plan of Care  Patient       Patient will benefit from skilled therapeutic intervention in order to improve the following deficits and impairments:  Decreased coordination, Decreased mobility, Decreased endurance, Decreased range of motion, Decreased activity tolerance, Decreased strength, Decreased balance, Difficulty walking, Pain, Abnormal gait  Visit Diagnosis: Difficulty in walking, not elsewhere classified  Foot drop, right     Problem List Patient Active Problem List   Diagnosis Date Noted  . Bilateral primary ovarian cancer ([place]) 02/02/2019  . Genetic testing 12/09/2018  . Status post  hysterectomy 11/26/2018  . Need for prophylactic vaccination and inoculation against influenza 11/04/2018  . Anemia of chronic disease 10/22/2018  . B12 deficiency 10/22/2018  . Family history of lung cancer   . Serous adenocarcinoma ([place]) 08/13/2018  . Goals of care, counseling/discussion 08/11/2018  . 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 Loss of weight   . Benign neoplasm of descending colon   . Type 2 diabetes mellitus without complication, without long-term current use of insulin ([place]) 06/07/2015  . 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 Cyst of ovary 06/09/2014  . Bulky or enlarged uterus 06/09/2014  . Hypercholesteremia 02/09/2007  . Allergic rhinitis 11/27/2006  . Endometriosis 11/27/2006  . Adult hypothyroidism 11/27/2006    Blythe Stanford, PT DPT 06/02/2019, 1:55 PM  [facility] [address], [place], [address] Phone: [phone]   Fax:  [phone]  Name: Meredith Jones MRN: [id number] Date of Birth: 03/06/1955

## 2019-06-02 NOTE — Telephone Encounter (Signed)
Courtesy refill Message sent to patient to schedule follow up visit

## 2019-06-04 ENCOUNTER — Other Ambulatory Visit: Payer: Self-pay

## 2019-06-04 ENCOUNTER — Ambulatory Visit: Payer: Commercial Managed Care - PPO

## 2019-06-04 DIAGNOSIS — R262 Difficulty in walking, not elsewhere classified: Secondary | ICD-10-CM | POA: Diagnosis not present

## 2019-06-04 DIAGNOSIS — M21371 Foot drop, right foot: Secondary | ICD-10-CM

## 2019-06-04 NOTE — Therapy (Signed)
[facility] [address], [place], [address] Phone: [phone]   Fax:  [phone]  Physical Therapy Treatment  Patient Details  Name: Meredith Jones MRN: [id number] Date of Birth: Jul 17, 1955 Referring Provider (PT): Janese Banks MD   Encounter Date: 06/04/2019  PT End of Session - 06/04/19 1401    Visit Number  26    Number of Visits  30    Date for PT Re-Evaluation  06/17/19    Authorization Type  6/10    PT Start Time  [id number]    PT Stop Time  1430    PT Time Calculation (min)  45 min    Equipment Utilized During Treatment  Gait belt    Activity Tolerance  Patient tolerated treatment well    Behavior During Therapy  [facility] for tasks assessed/performed       Past Medical History:  Diagnosis Date  . Allergy   . Anemia   . Complication of anesthesia   . Diabetes mellitus without complication ([place])   . Diverticulosis   . Family history of adverse reaction to anesthesia    mom-n/v  . Family history of lung cancer   . GERD (gastroesophageal reflux disease)    h/o  . Hyperlipidemia   . Hypothyroidism   . Ovarian cancer ([place]) 08/04/2018   Chemo tx's and Hysterectomy.  . Personal history of chemotherapy   . PONV (postoperative nausea and vomiting)    sick with tonsillectomy  . 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 Thyroid disease     Past Surgical History:  Procedure Laterality Date  . ABDOMINAL HYSTERECTOMY    . BACK SURGERY  2007   Lumbar Surgery  . BREAST CYST ASPIRATION Right    Around 10 years ago. Pt thinks it was the right breast but not sure.  Marland Kitchen CATARACT EXTRACTION Left 2010   Right Eye 2011  . COLONOSCOPY WITH PROPOFOL N/A 09/02/2015   Procedure: COLONOSCOPY WITH PROPOFOL;  Surgeon: [name], MD;  Location: [place];  Service: Endoscopy;  Laterality: N/A;  Diabetic - oral meds  . ESOPHAGOGASTRODUODENOSCOPY (EGD) WITH PROPOFOL N/A 11/03/2015   Procedure:  ESOPHAGOGASTRODUODENOSCOPY (EGD) WITH PROPOFOL;  Surgeon: [name], MD;  Location: [place] [facility];  Service: Endoscopy;  Laterality: N/A;  . EYE SURGERY Bilateral 06/2014    tear duck catherization ( Dr. Vallarie Mare)  . LAPAROSCOPIC APPENDECTOMY  11/26/2018   Procedure: APPENDECTOMY LAPAROSCOPIC;  Surgeon: Ward, Honor Loh, MD;  Location: [facility];  Service: Gynecology;;  . POLYPECTOMY  09/02/2015   Procedure: POLYPECTOMY;  Surgeon: [name], MD;  Location: [place];  Service: Endoscopy;;  . TONSILLECTOMY  1963    There were no vitals filed for this visit.  Subjective Assessment - 06/04/19 1400    Subjective  Patient reports no major changes. Patient states she's been improving and been able to pick up her foot more when walking.    Pertinent History  CA history, radiation/chemo treatments, neuropathy into the feet    Limitations  Walking    How long can you walk comfortably?  Increased falls    Diagnostic tests  not performed    Patient Stated Goals  To decrease walking quality    Currently in Pain?  No/denies                 TREATMENT   Therapeutic Exercise Seated dorsiflexion with 5# -- x 20; x 20 Hip abduction in standing with  RTB - x 20  Sit to stands with use holding 15# -- 2 x 10 Hip extension in standing with RTB - x 20  LEaningiforward into hands for balance - x 20  Tandem walking with head turns laterally, up/down, EC - x20    Performed exercises to improve hip strength , and power to address dynamic balance    PT Education - 06/04/19 1401    Education Details  form/technique with exercise    Person(s) Educated  Patient    Methods  Explanation;Demonstration    Comprehension  Verbalized understanding;Returned demonstration       PT Short Term Goals - 03/18/19 1708      PT SHORT TERM GOAL #1   Title  Patient will be independent with HEP to continue benefits of therapy after discharge.    Baseline  Dependent with form/technique     Time  6    Period  Weeks    Status  New    Target Date  04/29/19        PT Long Term Goals - 05/06/19 1425      PT LONG TERM GOAL #1   Title  Patient will improve 5xSTS to under 12 sec to decrease fall risk and ability to stand from low surfaces    Baseline  5XSTS: 25sec. 03/18/19: 15.4sec; 04/07/2019: 16.5sec. 04/09/2019: 19.38 sec;  05/06/2019: 12.84sec    Time  6    Period  Weeks    Status  On-going      PT LONG TERM GOAL #2   Title  Patient will improve TUG to under 8 sec to indicate decrease fall risks most notably with transferring to amb.    Baseline  TUG: 12.5sec, 03/18/19: 12.46 seconds; 04/07/2019: Deferred. 04/09/2019: 14.26 sec;  05/06/2019: 10.88 sec    Time  6    Period  Weeks    Status  On-going      PT LONG TERM GOAL #3   Title  Patient will improve ability to perform 17mWT to over 1 m/s to indicate increased safety with community amb.    Baseline  .8 m/s, 03/18/19: .890m/s; 04/07/2019: Deferred. 04/09/2019: 0.27m/s; 05/06/2019: 1.13 m/s    Time  6    Period  Weeks    Status  Achieved      PT LONG TERM GOAL #4   Title  Patient will improve FGA to 27 points to indicate improvement with functional and dynamic gait with exercise.    Baseline  20/30 FGA, 03/18/19 FGA: 18 /30; 2/23/20221: Deferred. 04/09/2019: 20/30; 05/06/2019: 25/30    Time  6    Period  Weeks    Status  On-going      PT LONG TERM GOAL #5   Title  Patient will improve her FOTO score to 77 to indicate significant improvement with her balance.    Baseline  55 FOTO, 03/18/19: 66; 04/07/2019: Defered. 04/09/2019: 58; 05/06/2019: 61    Time  6    Period  Weeks    Status  On-going      PT LONG TERM GOAL #6   Title  Patient will be able to achieve a 4/5 for DF MMT in order for her to ambulate around the home with decreased risk of falling    Baseline  04/09/19: 2-/5 MMT for DF;  05/06/2019: 2/5    Time  6    Period  Weeks    Status  On-going  Plan - 06/04/19 1407    Clinical Impression Statement   COntinued to focus on strengthening of the LE and the hip today during today's session. Patient does well with this overall with ability to perform more repeteitions compared to previoous sessions. Although patinet is improving she continues to have difficulty with performing narrow base of support dyanmic balancing. Patient will benefit from further skiled therapy to return to prior level of function.    Personal Factors and Comorbidities  Age;Comorbidity 3+;Past/Current Experience;Other    Comorbidities  hx of CA, neuropathy, drop foot    Examination-Activity Limitations  Carry;Stairs;Stand;Bend    Examination-Participation Restrictions  Cleaning;Community Activity;Yard Work    [employer]  Evolving/Moderate complexity    Rehab Potential  Fair    PT Frequency  2x / week    PT Duration  6 weeks    PT Treatment/Interventions  Balance training;Neuromuscular re-education;Functional mobility training;Therapeutic activities;Gait training;Stair training;Electrical Stimulation;Manual techniques;Energy conservation;Patient/family education;Therapeutic exercise    PT Next Visit Plan  Hip strength, dyanmic balance, Squats, low functioning glute med exercises    PT Home Exercise Plan  Sit to stands; SLS; hip abductor strength    Consulted and Agree with Plan of Care  Patient       Patient will benefit from skilled therapeutic intervention in order to improve the following deficits and impairments:  Decreased coordination, Decreased mobility, Decreased endurance, Decreased range of motion, Decreased activity tolerance, Decreased strength, Decreased balance, Difficulty walking, Pain, Abnormal gait  Visit Diagnosis: Foot drop, right  Difficulty in walking, not elsewhere classified     Problem List Patient Active Problem List   Diagnosis Date Noted  . Bilateral primary ovarian cancer ([place]) 02/02/2019  . Genetic testing 12/09/2018  . Status post hysterectomy 11/26/2018  . Need  for prophylactic vaccination and inoculation against influenza 11/04/2018  . Anemia of chronic disease 10/22/2018  . B12 deficiency 10/22/2018  . Family history of lung cancer   . 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 Goals of care, counseling/discussion 08/11/2018  . 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 Loss of weight   . Benign neoplasm of descending colon   . Type 2 diabetes mellitus without complication, without long-term current use of insulin ([place]) 06/07/2015  . 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 Cyst of ovary 06/09/2014  . Bulky or enlarged uterus 06/09/2014  . Hypercholesteremia 02/09/2007  . Allergic rhinitis 11/27/2006  . Endometriosis 11/27/2006  . Adult hypothyroidism 11/27/2006    Blythe Stanford 06/04/2019, 2:30 PM  [facility] [address] [place], [address] Phone: [phone]   Fax:  [phone]  Name: Meredith Jones MRN: [id number] Date of Birth: November 27, 1955

## 2019-06-08 ENCOUNTER — Ambulatory Visit: Payer: Commercial Managed Care - PPO

## 2019-06-08 ENCOUNTER — Inpatient Hospital Stay: Payer: Commercial Managed Care - PPO

## 2019-06-08 ENCOUNTER — Ambulatory Visit: Payer: Commercial Managed Care - PPO | Admitting: Oncology

## 2019-06-08 ENCOUNTER — Encounter: Payer: Commercial Managed Care - PPO | Admitting: Hospice and Palliative Medicine

## 2019-06-08 ENCOUNTER — Inpatient Hospital Stay (HOSPITAL_BASED_OUTPATIENT_CLINIC_OR_DEPARTMENT_OTHER): Payer: Commercial Managed Care - PPO | Admitting: Oncology

## 2019-06-08 ENCOUNTER — Inpatient Hospital Stay (HOSPITAL_BASED_OUTPATIENT_CLINIC_OR_DEPARTMENT_OTHER): Payer: Commercial Managed Care - PPO | Admitting: Hospice and Palliative Medicine

## 2019-06-08 ENCOUNTER — Other Ambulatory Visit: Payer: Commercial Managed Care - PPO

## 2019-06-08 ENCOUNTER — Other Ambulatory Visit: Payer: Self-pay

## 2019-06-08 VITALS — BP 135/67 | HR 74 | Temp 96.5°F | Resp 16 | Wt 114.3 lb

## 2019-06-08 DIAGNOSIS — C569 Malignant neoplasm of unspecified ovary: Secondary | ICD-10-CM

## 2019-06-08 DIAGNOSIS — Z7189 Other specified counseling: Secondary | ICD-10-CM

## 2019-06-08 DIAGNOSIS — C801 Malignant (primary) neoplasm, unspecified: Secondary | ICD-10-CM | POA: Diagnosis not present

## 2019-06-08 DIAGNOSIS — T451X5A Adverse effect of antineoplastic and immunosuppressive drugs, initial encounter: Secondary | ICD-10-CM

## 2019-06-08 DIAGNOSIS — Z5112 Encounter for antineoplastic immunotherapy: Secondary | ICD-10-CM | POA: Diagnosis not present

## 2019-06-08 DIAGNOSIS — G620 Drug-induced polyneuropathy: Secondary | ICD-10-CM | POA: Diagnosis not present

## 2019-06-08 DIAGNOSIS — Z515 Encounter for palliative care: Secondary | ICD-10-CM

## 2019-06-08 LAB — CBC WITH DIFFERENTIAL/PLATELET
Abs Immature Granulocytes: 0.02 10*3/uL (ref 0.00–0.07)
Basophils Absolute: 0.1 10*3/uL (ref 0.0–0.1)
Basophils Relative: 1 %
Eosinophils Absolute: 0.6 10*3/uL — ABNORMAL HIGH (ref 0.0–0.5)
Eosinophils Relative: 10 %
HCT: 35.6 % — ABNORMAL LOW (ref 36.0–46.0)
Hemoglobin: 12.4 g/dL (ref 12.0–15.0)
Immature Granulocytes: 0 %
Lymphocytes Relative: 12 %
Lymphs Abs: 0.7 10*3/uL (ref 0.7–4.0)
MCH: 35.6 pg — ABNORMAL HIGH (ref 26.0–34.0)
MCHC: 34.8 g/dL (ref 30.0–36.0)
MCV: 102.3 fL — ABNORMAL HIGH (ref 80.0–100.0)
Monocytes Absolute: 0.6 10*3/uL (ref 0.1–1.0)
Monocytes Relative: 10 %
Neutro Abs: 4 10*3/uL (ref 1.7–7.7)
Neutrophils Relative %: 67 %
Platelets: 148 10*3/uL — ABNORMAL LOW (ref 150–400)
RBC: 3.48 MIL/uL — ABNORMAL LOW (ref 3.87–5.11)
RDW: 13.2 % (ref 11.5–15.5)
WBC: 6 10*3/uL (ref 4.0–10.5)
nRBC: 0 % (ref 0.0–0.2)

## 2019-06-08 LAB — COMPREHENSIVE METABOLIC PANEL
ALT: 25 U/L (ref 0–44)
AST: 26 U/L (ref 15–41)
Albumin: 3.5 g/dL (ref 3.5–5.0)
Alkaline Phosphatase: 45 U/L (ref 38–126)
Anion gap: 9 (ref 5–15)
BUN: 15 mg/dL (ref 8–23)
CO2: 31 mmol/L (ref 22–32)
Calcium: 9.6 mg/dL (ref 8.9–10.3)
Chloride: 91 mmol/L — ABNORMAL LOW (ref 98–111)
Creatinine, Ser: 0.5 mg/dL (ref 0.44–1.00)
GFR calc Af Amer: >60 mL/min (ref >60)
GFR calc non Af Amer: >60 mL/min (ref >60)
Glucose, Bld: 130 mg/dL — ABNORMAL HIGH (ref 70–99)
Potassium: 4 mmol/L (ref 3.5–5.1)
Sodium: 131 mmol/L — ABNORMAL LOW (ref 135–145)
Total Bilirubin: 0.5 mg/dL (ref 0.3–1.2)
Total Protein: 7.3 g/dL (ref 6.5–8.1)

## 2019-06-08 LAB — PROTEIN, URINE, RANDOM: Total Protein, Urine: 10 mg/dL

## 2019-06-08 MED ORDER — SODIUM CHLORIDE 0.9 % IV SOLN
Freq: Once | INTRAVENOUS | Status: AC
  Filled 2019-06-08: qty 250

## 2019-06-08 MED ORDER — SODIUM CHLORIDE 0.9 % IV SOLN
15.0000 mg/kg | Freq: Once | INTRAVENOUS | Status: AC
  Administered 2019-06-08: 800 mg via INTRAVENOUS
  Filled 2019-06-08: qty 32

## 2019-06-08 NOTE — Progress Notes (Signed)
[facility]  [phone] [phone]   Name: Meredith Jones Date: 06/08/2019 MRN: [id number]  DOB: 22-Nov-1955  Patient Care Team: Mar Daring, PA-C as PCP - General (Family Medicine) Clent Jacks, RN as Oncology Nurse Navigator    REASON FOR CONSULTATION: Meredith Jones is a 64 y.o. 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 Abdominal CT revealed cystic lesions in the bilateral ovaries with numerous bulky retroperitoneal lymph nodes and a large amount of ascites. She is s/p therapeutic paracentesis. She is s/p TAH and BSO on 11/26/2018. She is nowreceiving adjuvant systemic chemotherapy.Patient was referred to palliative care to help address goals and manage ongoing symptoms.   SOCIAL HISTORY:     reports that she has never smoked. She has never used smokeless tobacco. She reports that she does not drink alcohol or use drugs.   Patient is not married and has no children.  Her parents are deceased and she has no siblings.  She lives at home with a boyfriend.  Patient previously worked as a Scientist, water quality at [employer].  ADVANCE DIRECTIVES:  Does not have  CODE STATUS: Full Code (MOST form completed on 10/02/18)  PAST MEDICAL HISTORY: Past Medical History:  Diagnosis Date  . Allergy   . Anemia   . Complication of anesthesia   . Diabetes mellitus without complication ([place])   . Diverticulosis   . Family history of adverse reaction to anesthesia    mom-n/v  . Family history of lung cancer   . GERD (gastroesophageal reflux disease)    h/o  . Hyperlipidemia   . Hypothyroidism   . Ovarian cancer ([place]) 08/04/2018   Chemo tx's and Hysterectomy.  . Personal history of chemotherapy   . PONV (postoperative nausea and vomiting)    sick with tonsillectomy  . 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 Thyroid disease     PAST  SURGICAL HISTORY:  Past Surgical History:  Procedure Laterality Date  . ABDOMINAL HYSTERECTOMY    . BACK SURGERY  2007   Lumbar Surgery  . BREAST CYST ASPIRATION Right    Around 10 years ago. Pt thinks it was the right breast but not sure.  Marland Kitchen CATARACT EXTRACTION Left 2010   Right Eye 2011  . COLONOSCOPY WITH PROPOFOL N/A 09/02/2015   Procedure: COLONOSCOPY WITH PROPOFOL;  Surgeon: [name], MD;  Location: [place];  Service: Endoscopy;  Laterality: N/A;  Diabetic - oral meds  . ESOPHAGOGASTRODUODENOSCOPY (EGD) WITH PROPOFOL N/A 11/03/2015   Procedure: ESOPHAGOGASTRODUODENOSCOPY (EGD) WITH PROPOFOL;  Surgeon: [name], MD;  Location: [place];  Service: Endoscopy;  Laterality: N/A;  . EYE SURGERY Bilateral 06/2014    tear duck catherization ( Dr. Vallarie Mare)  . LAPAROSCOPIC APPENDECTOMY  11/26/2018   Procedure: APPENDECTOMY LAPAROSCOPIC;  Surgeon: Ward, Honor Loh, MD;  Location: [facility];  Service: Gynecology;;  . POLYPECTOMY  09/02/2015   Procedure: POLYPECTOMY;  Surgeon: [name], MD;  Location: [place];  Service: Endoscopy;;  . TONSILLECTOMY  1963    HEMATOLOGY/ONCOLOGY HISTORY:  Oncology History  Serous adenocarcinoma ([place])  08/13/2018 Initial Diagnosis   Serous adenocarcinoma ([place])   08/14/2018 Cancer Staging   Staging form: Exocrine Pancreas, AJCC 8th Edition - Clinical stage from 08/14/2018: Stage III (cT3, cN2, cM0) - Signed by Sindy Guadeloupe, MD on 08/17/2018   08/21/2018 -  Chemotherapy   The patient had  palonosetron (ALOXI) injection 0.25 mg, 0.25 mg, Intravenous,  Once, 6 of 6 cycles Administration: 0.25 mg (08/21/2018), 0.25 mg (09/11/2018), 0.25 mg (10/02/2018), 0.25 mg (10/23/2018), 0.25 mg (12/22/2018), 0.25 mg (01/12/2019) pegfilgrastim (NEULASTA ONPRO KIT) injection 6 mg, 6 mg, Subcutaneous, Once, 6 of 6 cycles Administration: 6 mg (08/21/2018), 6 mg (09/11/2018), 6 mg (10/02/2018), 6 mg (10/23/2018), 6 mg (12/22/2018), 6 mg  (01/12/2019) bevacizumab (AVASTIN) 800 mg in sodium chloride 0.9 % 100 mL chemo infusion, 15 mg/kg = 800 mg (100 % of original dose 15 mg/kg), Intravenous,  Once, 1 of 1 cycle Dose modification: 15 mg/kg (original dose 15 mg/kg, Cycle 1) Administration: 800 mg (08/21/2018) CARBOplatin (PARAPLATIN) 430 mg in sodium chloride 0.9 % 250 mL chemo infusion, 430 mg (100 % of original dose 429.5 mg), Intravenous,  Once, 6 of 6 cycles Dose modification:   (original dose 429.5 mg, Cycle 1) Administration: 430 mg (08/21/2018), 430 mg (09/11/2018), 430 mg (10/02/2018), 430 mg (10/23/2018), 390 mg (12/22/2018), 390 mg (01/12/2019) PACLitaxel (TAXOL) 270 mg in sodium chloride 0.9 % 250 mL chemo infusion (> '80mg'$ /m2), 175 mg/m2 = 270 mg, Intravenous,  Once, 6 of 6 cycles Dose modification: 160 mg/m2 (original dose 175 mg/m2, Cycle 5, Reason: Other (see comments), Comment: neuropathy) Administration: 270 mg (08/21/2018), 270 mg (09/11/2018), 270 mg (10/02/2018), 270 mg (10/23/2018), 246 mg (12/22/2018), 246 mg (01/12/2019) fosaprepitant (EMEND) 150 mg, dexamethasone (DECADRON) 12 mg in sodium chloride 0.9 % 145 mL IVPB, , Intravenous,  Once, 6 of 6 cycles Administration:  (08/21/2018),  (09/11/2018),  (10/02/2018),  (10/23/2018),  (12/22/2018),  (01/12/2019) bevacizumab-awwb (MVASI) 800 mg in sodium chloride 0.9 % 100 mL chemo infusion, 15 mg/kg = 800 mg (100 % of original dose 15 mg/kg), Intravenous,  Once, 10 of 10 cycles Dose modification: 15 mg/kg (original dose 15 mg/kg, Cycle 2) Administration: 800 mg (09/11/2018), 800 mg (10/02/2018), 800 mg (01/12/2019), 800 mg (02/02/2019), 800 mg (02/23/2019), 800 mg (03/16/2019), 800 mg (04/06/2019), 800 mg (04/27/2019), 800 mg (05/18/2019)  for chemotherapy treatment.    11/07/2018 Genetic Testing   Negative germline + somatic genetic testing. No pathogenic variants identified on the [facility] panel and [facility] HRD. The [facility] gene panel offered by [employer] includes  sequencing and deletion/duplication testing of the following 35 genes: APC, ATM, AXIN2, BARD1, BMPR1A, BRCA1, BRCA2, BRIP1, CHD1, CDK4, CDKN2A, CHEK2, EPCAM (large rearrangement only), HOXB13, GALNT12, MLH1, MSH2, MSH3, MSH6, MUTYH, NBN, NTHL1, PALB2, PMS2, PTEN, RAD51C, RAD51D, RNF43, RPS20, SMAD4, STK11, and TP53. Sequencing was performed for select regions of POLE and POLD1, and large rearrangement analysis was performed for select regions of GREM1. The report date is 11/07/2018.    Bilateral primary ovarian cancer ([place])  02/02/2019 Initial Diagnosis   Bilateral primary ovarian cancer ([facility])     ALLERGIES:  is allergic to clarithromycin; doxycycline; and penicillins.  MEDICATIONS:  Current Outpatient Medications  Medication Sig Dispense Refill  . acetaminophen (TYLENOL) 500 MG tablet Take 2 tablets (1,000 mg total) by mouth every 6 (six) hours.    Marland Kitchen aspirin 81 MG tablet Take 81 mg by mouth daily.     . calcipotriene (DOVONOX) 0.005 % ointment Apply 1 application topically as needed. CALCIPOTRIENE, 0.005% (External Ointment) - Historical Medication  apply to skin daily (0.005 %) Active    . dexamethasone (DECADRON) 4 MG tablet Take 2 tablets (8 mg total) by mouth daily. Start the day after carboplatin chemotherapy for 3 days. 30 tablet 1  . fexofenadine (ALLEGRA) 60 MG tablet Take 60  mg by mouth daily as needed for allergies.     . furosemide (LASIX) 20 MG tablet Take 1 tablet (20 mg total) by mouth daily. 20 tablet 0  . gabapentin (NEURONTIN) 300 MG capsule Take 1 capsule (300 mg total) by mouth at bedtime. Take 2 tablets at bedtime once and then 3 tablets for three nights at bedtime. 11 capsule 0  . glipiZIDE (GLUCOTROL) 5 MG tablet TAKE 1 TABLET BY MOUTH DAILY BEFORE BREAKFAST (Patient taking differently: Take 5 mg by mouth daily before breakfast. ) 90 tablet 1  . glucose blood (ONE TOUCH ULTRA TEST) test strip USE TO TEST BLOOD SUGAR TWICE A DAY AS INSTRUCTED 100 each 12  . ibuprofen  (ADVIL) 600 MG tablet Take 1 tablet (600 mg total) by mouth every 6 (six) hours. 45 tablet 1  . insulin degludec (TRESIBA FLEXTOUCH) 100 UNIT/ML SOPN FlexTouch Pen Inject 15 Units into the skin daily.    Marland Kitchen levothyroxine (SYNTHROID) 100 MCG tablet TAKE 1 TABLET BY MOUTH DAILY 30 tablet 0  . LORazepam (ATIVAN) 0.5 MG tablet Take 1 tablet (0.5 mg total) by mouth every 6 (six) hours as needed (Nausea or vomiting). (Patient not taking: Reported on 06/08/2019) 30 tablet 0  . metFORMIN (GLUCOPHAGE) 500 MG tablet Take 2 tablets (1,000 mg total) by mouth 2 (two) times daily. 360 tablet 1  . MULTIPLE VITAMIN PO Take 1 tablet by mouth daily.     . ondansetron (ZOFRAN) 8 MG tablet Take 1 tablet (8 mg total) by mouth 2 (two) times daily as needed for refractory nausea / vomiting. Start on day 3 after carboplatin chemo. (Patient not taking: Reported on 05/18/2019) 30 tablet 1  . ONETOUCH DELICA LANCETS 75P MISC USE TO TEST BLOOD SUGAR ONCE A DAY 100 each 12  . oxyCODONE (ROXICODONE) 5 MG immediate release tablet Take 1 tablet (5 mg total) by mouth every 8 (eight) hours as needed. (Patient not taking: Reported on 06/08/2019) 16 tablet 0  . prochlorperazine (COMPAZINE) 10 MG tablet Take 1 tablet (10 mg total) by mouth every 6 (six) hours as needed (Nausea or vomiting). (Patient not taking: Reported on 06/08/2019) 30 tablet 1  . simvastatin (ZOCOR) 20 MG tablet Take 1 tablet (20 mg total) by mouth at bedtime. Please schedule office visit before any future refills 90 tablet 0  . spironolactone (ALDACTONE) 50 MG tablet Take 1 tablet (50 mg total) by mouth daily. 20 tablet 0  . XIIDRA 5 % SOLN      No current facility-administered medications for this visit.   Facility-Administered Medications Ordered in Other Visits  Medication Dose Route Frequency Provider Last Rate Last Admin  . bevacizumab-awwb (MVASI) 800 mg in sodium chloride 0.9 % 100 mL chemo infusion  15 mg/kg (Treatment Plan Recorded) Intravenous Once Sindy Guadeloupe, MD 528 mL/hr at 06/08/19 1200 800 mg at 06/08/19 1200  . sodium chloride flush (NS) 0.9 % injection 10 mL  10 mL Intravenous Once Sindy Guadeloupe, MD      . sodium chloride flush (NS) 0.9 % injection 10 mL  10 mL Intravenous Once Sindy Guadeloupe, MD      . sodium chloride flush (NS) 0.9 % injection 10 mL  10 mL Intravenous Once Sindy Guadeloupe, MD        VITAL SIGNS: There were no vitals taken for this visit. There were no vitals filed for this visit.  Estimated body mass index is 19.62 kg/m as calculated from the following:  Height as of 04/06/19: '5\' 4"'$  (1.626 m).   Weight as of an earlier encounter on 06/08/19: 114 lb 4.8 oz (51.8 kg).  LABS: CBC:    Component Value Date/Time   WBC 6.0 06/08/2019 0934   HGB 12.4 06/08/2019 0934   HGB 10.2 (L) 05/31/2017 0836   HCT 35.6 (L) 06/08/2019 0934   HCT 31.4 (L) 05/31/2017 0836   PLT 148 (L) 06/08/2019 0934   PLT 374 05/31/2017 0836   MCV 102.3 (H) 06/08/2019 0934   MCV 100 (H) 05/31/2017 0836   NEUTROABS 4.0 06/08/2019 0934   NEUTROABS 3.4 05/31/2017 0836   LYMPHSABS 0.7 06/08/2019 0934   LYMPHSABS 1.0 05/31/2017 0836   MONOABS 0.6 06/08/2019 0934   EOSABS 0.6 (H) 06/08/2019 0934   EOSABS 0.4 05/31/2017 0836   BASOSABS 0.1 06/08/2019 0934   BASOSABS 0.0 05/31/2017 0836   Comprehensive Metabolic Panel:    Component Value Date/Time   NA 131 (L) 06/08/2019 0934   NA 142 05/31/2017 0836   K 4.0 06/08/2019 0934   CL 91 (L) 06/08/2019 0934   CO2 31 06/08/2019 0934   BUN 15 06/08/2019 0934   BUN 9 05/31/2017 0836   CREATININE 0.50 06/08/2019 0934   CREATININE 0.57 11/13/2016 0933   GLUCOSE 130 (H) 06/08/2019 0934   CALCIUM 9.6 06/08/2019 0934   AST 26 06/08/2019 0934   ALT 25 06/08/2019 0934   ALKPHOS 45 06/08/2019 0934   BILITOT 0.5 06/08/2019 0934   BILITOT <0.2 05/31/2017 0836   PROT 7.3 06/08/2019 0934   PROT 6.1 05/31/2017 0836   ALBUMIN 3.5 06/08/2019 0934   ALBUMIN 3.4 (L) 05/31/2017 0836    RADIOGRAPHIC  STUDIES: CT Chest W Contrast  Result Date: 06/01/2019 CLINICAL DATA:  Restaging ovarian cancer. Initial diagnosis June 2020. EXAM: CT CHEST, ABDOMEN, AND PELVIS WITH CONTRAST TECHNIQUE: Multidetector CT imaging of the chest, abdomen and pelvis was performed following the standard protocol during bolus administration of intravenous contrast. CONTRAST:  70m OMNIPAQUE IOHEXOL 300 MG/ML  SOLN COMPARISON:  CT scan 10/16/2018 FINDINGS: CT CHEST FINDINGS Cardiovascular: The heart is normal in size. No pericardial effusion. Stable tortuosity of the thoracic aorta but no focal aneurysm or dissection. No atherosclerotic calcifications. The branch vessels are normal. No definite coronary artery calcifications. Mediastinum/Nodes: No mediastinal or hilar mass or adenopathy. The esophagus is grossly normal. Lungs/Pleura: No worrisome pulmonary lesions or pulmonary nodules. No pleural effusion or pleural nodules. Musculoskeletal: Extensive artifact related to midthoracic fusion hardware. No worrisome bone lesions CT ABDOMEN PELVIS FINDINGS Hepatobiliary: No worrisome hepatic lesions. No obvious hepatic peritoneal surface disease. No intra or extrahepatic biliary dilatation. The gallbladder appears normal. Pancreas: No mass, inflammation or ductal dilatation. Spleen: Normal size. No focal lesions. Adrenals/Urinary Tract: The adrenal glands and kidneys are unremarkable. Small right renal calculus is unchanged. The bladder appears normal. Stomach/Bowel: The stomach, duodenum, small bowel and colon are grossly normal. No acute inflammatory changes, mass lesions or obstructive findings. Moderate stool throughout the colon and down into the rectum may suggest constipation. Vascular/Lymphatic: The aorta and branch vessels are patent. Scattered atherosclerotic calcifications. The major venous structures are patent. No mesenteric or retroperitoneal mass or adenopathy is identified. I do not see any obvious omental disease. No  peritoneal surface lesions in the abdomen are identified. Reproductive: Surgically absent. Other: Small amount of slightly high attenuation ascites in the right deep pelvis. No pelvic sidewall or inguinal adenopathy. Musculoskeletal: No significant bony findings. IMPRESSION: 1. Small amount of slightly high attenuation ascites in  the right deep pelvis. No obvious omental disease or peritoneal surface lesions. 2. No abdominal/pelvic mass or adenopathy. 3. No findings for pulmonary metastatic disease. 4. Moderate stool throughout the colon and down into the rectum may suggest constipation. Electronically Signed   By: Marijo Sanes M.D.   On: 06/01/2019 12:59   CT ABDOMEN PELVIS W CONTRAST  Result Date: 06/01/2019 CLINICAL DATA:  Restaging ovarian cancer. Initial diagnosis June 2020. EXAM: CT CHEST, ABDOMEN, AND PELVIS WITH CONTRAST TECHNIQUE: Multidetector CT imaging of the chest, abdomen and pelvis was performed following the standard protocol during bolus administration of intravenous contrast. CONTRAST:  9m OMNIPAQUE IOHEXOL 300 MG/ML  SOLN COMPARISON:  CT scan 10/16/2018 FINDINGS: CT CHEST FINDINGS Cardiovascular: The heart is normal in size. No pericardial effusion. Stable tortuosity of the thoracic aorta but no focal aneurysm or dissection. No atherosclerotic calcifications. The branch vessels are normal. No definite coronary artery calcifications. Mediastinum/Nodes: No mediastinal or hilar mass or adenopathy. The esophagus is grossly normal. Lungs/Pleura: No worrisome pulmonary lesions or pulmonary nodules. No pleural effusion or pleural nodules. Musculoskeletal: Extensive artifact related to midthoracic fusion hardware. No worrisome bone lesions CT ABDOMEN PELVIS FINDINGS Hepatobiliary: No worrisome hepatic lesions. No obvious hepatic peritoneal surface disease. No intra or extrahepatic biliary dilatation. The gallbladder appears normal. Pancreas: No mass, inflammation or ductal dilatation. Spleen:  Normal size. No focal lesions. Adrenals/Urinary Tract: The adrenal glands and kidneys are unremarkable. Small right renal calculus is unchanged. The bladder appears normal. Stomach/Bowel: The stomach, duodenum, small bowel and colon are grossly normal. No acute inflammatory changes, mass lesions or obstructive findings. Moderate stool throughout the colon and down into the rectum may suggest constipation. Vascular/Lymphatic: The aorta and branch vessels are patent. Scattered atherosclerotic calcifications. The major venous structures are patent. No mesenteric or retroperitoneal mass or adenopathy is identified. I do not see any obvious omental disease. No peritoneal surface lesions in the abdomen are identified. Reproductive: Surgically absent. Other: Small amount of slightly high attenuation ascites in the right deep pelvis. No pelvic sidewall or inguinal adenopathy. Musculoskeletal: No significant bony findings. IMPRESSION: 1. Small amount of slightly high attenuation ascites in the right deep pelvis. No obvious omental disease or peritoneal surface lesions. 2. No abdominal/pelvic mass or adenopathy. 3. No findings for pulmonary metastatic disease. 4. Moderate stool throughout the colon and down into the rectum may suggest constipation. Electronically Signed   By: PMarijo SanesM.D.   On: 06/01/2019 12:59    PERFORMANCE STATUS (ECOG) : 1 - Symptomatic but completely ambulatory  Review of Systems Unless otherwise noted, a complete review of systems is negative.  Physical Exam General: NAD Pulmonary: Unlabored Extremities: no edema, no joint deformities Skin: no rashes Neurological: Weakness but otherwise nonfocal  IMPRESSION: Routine follow-up visit today.  Patient was seen in the infusion area.  Patient reports doing well.  She denies any significant changes or concerns.  She reports improved performance status working with PT at home.  No symptomatic concerns today.  Patient wanted to  establish advance care plans.  We discussed ACP documents in detail, which she took home to complete.  Patient says that her boyfriend has been reluctant to have conversations regarding end-of-life planning but she feels that it is important to do so.  Note, I have previously completed a MOST form with her at which point she wanted to remain a full code.  I again reviewed a MOST form today, which she took him to think about.  PLAN: -Continue current prescription treatment -  ACP/MOST form reviewed -Follow-up virtual visit in 2-3 months   Patient expressed understanding and was in agreement with this plan. She also understands that She can call the clinic at any time with any questions, concerns, or complaints.     Time Total: 20 minutes  Visit consisted of counseling and education dealing with the complex and emotionally intense issues of symptom management and palliative care in the setting of serious and potentially life-threatening illness.Greater than 50%  of this time was spent counseling and coordinating care related to the above assessment and plan.  Signed by: Altha Harm, PhD, NP-C

## 2019-06-08 NOTE — Progress Notes (Signed)
Patient here for oncology follow-up appointment, expresses no complaints or concerns at this time, states she is walking better.

## 2019-06-09 ENCOUNTER — Ambulatory Visit: Payer: Commercial Managed Care - PPO

## 2019-06-09 DIAGNOSIS — R262 Difficulty in walking, not elsewhere classified: Secondary | ICD-10-CM

## 2019-06-09 DIAGNOSIS — M21371 Foot drop, right foot: Secondary | ICD-10-CM

## 2019-06-09 LAB — CA 125: Cancer Antigen (CA) 125: 99.7 U/mL — ABNORMAL HIGH (ref 0.0–38.1)

## 2019-06-09 NOTE — Therapy (Signed)
[facility] [address], [place], [address] Phone: [phone]   Fax:  [phone]  Physical Therapy Treatment  Patient Details  Name: Meredith Jones MRN: [id number] Date of Birth: [birth year] Referring Provider (PT): Janese Banks MD   Encounter Date: 06/09/2019  PT End of Session - 06/09/19 1522    Visit Number  27    Number of Visits  30    Date for PT Re-Evaluation  06/17/19    Authorization Type  7/10    PT Start Time  1515    PT Stop Time  1600    PT Time Calculation (min)  45 min    Equipment Utilized During Treatment  Gait belt    Activity Tolerance  Patient tolerated treatment well    Behavior During Therapy  [facility] for tasks assessed/performed       Past Medical History:  Diagnosis Date  . Allergy   . Anemia   . Complication of anesthesia   . Diabetes mellitus without complication ([place])   . Diverticulosis   . Family history of adverse reaction to anesthesia    mom-n/v  . Family history of lung cancer   . GERD (gastroesophageal reflux disease)    h/o  . Hyperlipidemia   . Hypothyroidism   . Ovarian cancer ([place]) 08/04/2018   Chemo tx's and Hysterectomy.  . Personal history of chemotherapy   . PONV (postoperative nausea and vomiting)    sick with tonsillectomy  . 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 Thyroid disease     Past Surgical History:  Procedure Laterality Date  . ABDOMINAL HYSTERECTOMY    . BACK SURGERY  2007   Lumbar Surgery  . BREAST CYST ASPIRATION Right    Around 10 years ago. Pt thinks it was the right breast but not sure.  Marland Kitchen CATARACT EXTRACTION Left 2010   Right Eye 2011  . COLONOSCOPY WITH PROPOFOL N/A 09/02/2015   Procedure: COLONOSCOPY WITH PROPOFOL;  Surgeon: [name], MD;  Location: [place];  Service: Endoscopy;  Laterality: N/A;  Diabetic - oral meds  . ESOPHAGOGASTRODUODENOSCOPY (EGD) WITH PROPOFOL N/A 11/03/2015   Procedure:  ESOPHAGOGASTRODUODENOSCOPY (EGD) WITH PROPOFOL;  Surgeon: [name], MD;  Location: [place];  Service: Endoscopy;  Laterality: N/A;  . EYE SURGERY Bilateral 06/2014    tear duck catherization ( Dr. Vallarie Mare)  . LAPAROSCOPIC APPENDECTOMY  11/26/2018   Procedure: APPENDECTOMY LAPAROSCOPIC;  Surgeon: Ward, Honor Loh, MD;  Location: [facility];  Service: Gynecology;;  . POLYPECTOMY  09/02/2015   Procedure: POLYPECTOMY;  Surgeon: [name], MD;  Location: [facility];  Service: Endoscopy;;  . TONSILLECTOMY  1963    There were no vitals filed for this visit.  Subjective Assessment - 06/09/19 1518    Subjective  Patient states she had a chemo treatment yesterday and reports she got a good report from her surgeon the day before.    Pertinent History  CA history, radiation/chemo treatments, neuropathy into the feet    Limitations  Walking    How long can you walk comfortably?  Increased falls    Diagnostic tests  not performed    Patient Stated Goals  To decrease walking quality    Currently in Pain?  No/denies              Therapeutic Exercise Seated dorsiflexion with 8# -- x 20;  Step ups onto 8" step with unilateral UE support with high knee -  x 10; x10 with CGA Jogging to tolerance in speed - 8 x 36ft Sit to stands with use holding 15# -- 2 x 10 Tandem walking with head turns laterally, up/down, EC - x20  forward/backward Grapevining - x73ft B with CGA with therapist   Performed exercises to improve hip strength , and   PT Education - 06/09/19 1522    Education Details  form/technique with exercise    Person(s) Educated  Patient    Methods  Explanation;Demonstration    Comprehension  Verbalized understanding;Returned demonstration       PT Short Term Goals - 03/18/19 1708      PT SHORT TERM GOAL #1   Title  Patient will be independent with HEP to continue benefits of therapy after discharge.    Baseline  Dependent with form/technique    Time  6     Period  Weeks    Status  New    Target Date  04/29/19        PT Long Term Goals - 05/06/19 1425      PT LONG TERM GOAL #1   Title  Patient will improve 5xSTS to under 12 sec to decrease fall risk and ability to stand from low surfaces    Baseline  5XSTS: 25sec. 03/18/19: 15.4sec; 04/07/2019: 16.5sec. 04/09/2019: 19.38 sec;  05/06/2019: 12.84sec    Time  6    Period  Weeks    Status  On-going      PT LONG TERM GOAL #2   Title  Patient will improve TUG to under 8 sec to indicate decrease fall risks most notably with transferring to amb.    Baseline  TUG: 12.5sec, 03/18/19: 12.46 seconds; 04/07/2019: Deferred. 04/09/2019: 14.26 sec;  05/06/2019: 10.88 sec    Time  6    Period  Weeks    Status  On-going      PT LONG TERM GOAL #3   Title  Patient will improve ability to perform 46mWT to over 1 m/s to indicate increased safety with community amb.    Baseline  .8 m/s, 03/18/19: .831m/s; 04/07/2019: Deferred. 04/09/2019: 0.9m/s; 05/06/2019: 1.13 m/s    Time  6    Period  Weeks    Status  Achieved      PT LONG TERM GOAL #4   Title  Patient will improve FGA to 27 points to indicate improvement with functional and dynamic gait with exercise.    Baseline  20/30 FGA, 03/18/19 FGA: 18 /30; 2/23/20221: Deferred. 04/09/2019: 20/30; 05/06/2019: 25/30    Time  6    Period  Weeks    Status  On-going      PT LONG TERM GOAL #5   Title  Patient will improve her FOTO score to 77 to indicate significant improvement with her balance.    Baseline  55 FOTO, 03/18/19: 66; 04/07/2019: Defered. 04/09/2019: 58; 05/06/2019: 61    Time  6    Period  Weeks    Status  On-going      PT LONG TERM GOAL #6   Title  Patient will be able to achieve a 4/5 for DF MMT in order for her to ambulate around the home with decreased risk of falling    Baseline  04/09/19: 2-/5 MMT for DF;  05/06/2019: 2/5    Time  6    Period  Weeks    Status  On-going            Plan - 06/09/19 1530  Clinical Impression Statement  Patient able to  perform a light jog during today's session indicating signifcant improvement in speed of LE overall. Patient demonstrates improvement with sit to stands with ability to perform sit to stands with holding a 15# weight. Patient is improving overall but continues to have a decrease in dynamic balance especially with Narrow BOS. Patient will benedit from further skilled therapy to return to prior level of function.    Personal Factors and Comorbidities  Age;Comorbidity 3+;Past/Current Experience;Other;Comorbidity 2    Comorbidities  hx of CA, neuropathy, drop foot    Examination-Activity Limitations  Carry;Stairs;Stand;Bend    Examination-Participation Restrictions  Cleaning;Community Activity;Yard Work    [employer]  Evolving/Moderate complexity    Rehab Potential  Fair    PT Frequency  2x / week    PT Duration  6 weeks    PT Treatment/Interventions  Balance training;Neuromuscular re-education;Functional mobility training;Therapeutic activities;Gait training;Stair training;Electrical Stimulation;Manual techniques;Energy conservation;Patient/family education;Therapeutic exercise    PT Next Visit Plan  Hip strength, dyanmic balance, Squats, low functioning glute med exercises    PT Home Exercise Plan  Sit to stands; SLS; hip abductor strength    Consulted and Agree with Plan of Care  Patient       Patient will benefit from skilled therapeutic intervention in order to improve the following deficits and impairments:  Decreased coordination, Decreased mobility, Decreased endurance, Decreased range of motion, Decreased activity tolerance, Decreased strength, Decreased balance, Difficulty walking, Pain, Abnormal gait  Visit Diagnosis: Foot drop, right  Difficulty in walking, not elsewhere classified     Problem List Patient Active Problem List   Diagnosis Date Noted  . Bilateral primary ovarian cancer ([place]) 02/02/2019  . Genetic testing 12/09/2018  . Status post  hysterectomy 11/26/2018  . Need for prophylactic vaccination and inoculation against influenza 11/04/2018  . Anemia of chronic disease 10/22/2018  . B12 deficiency 10/22/2018  . Family history of lung cancer   . 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 Goals of care, counseling/discussion 08/11/2018  . 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 Loss of weight   . Benign neoplasm of descending colon   . Type 2 diabetes mellitus without complication, without long-term current use of insulin ([place]) 06/07/2015  . 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 Cyst of ovary 06/09/2014  . Bulky or enlarged uterus 06/09/2014  . Hypercholesteremia 02/09/2007  . Allergic rhinitis 11/27/2006  . Endometriosis 11/27/2006  . Adult hypothyroidism 11/27/2006    Blythe Stanford, PT DPT 06/09/2019, 4:05 PM  [facility] [address], [place], [address] Phone: [phone]   Fax:  [phone]  Name: USHA BROOKENS MRN: [id number] Date of Birth: 10-Mar-1955

## 2019-06-09 NOTE — Progress Notes (Signed)
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 She presented with symptoms of bilateral lower extremity swelling as well as abdominal distention.GI. This was followed by a CT abdomen and chest imaging with contrast. CT showed cystic lesions of the enlarged bilateral ovaries the largest lesion on the left measuring 6.2 cm. Patient also noted to have numerous bulky retroperitoneal lymph nodes, largest left periaortic node or conglomerates near the superior mesenteric artery measuring at least 4.4 x 2.9 cm. Significant ascites was present. Patient underwent paracentesis and cytology from the fluid showed high-grade serous carcinoma. Tumor cells were positive for CK7, PAX 8, p53 and WT 1.  Patient started neoadjuvant carbotaxol and Avastin chemotherapy on 08/21/2018 and completed 4 cycles on 11/02/2018. Patient did not receive Avastin for cycle 4.  Pathology: DIAGNOSIS:  A. UTERUS AND CERVIX WITH BILATERAL FALLOPIAN TUBES AND OVARIES;  HYSTERECTOMY WITH BILATERAL SALPINGO-OOPHORECTOMY:  - HIGH-GRADE SEROUS ADENOCARCINOMA.  - REFER TO CANCER SUMMARY BELOW.   CANCER CASE SUMMARY: OVARY or FALLOPIAN TUBE or PRIMARY PERITONEUM  Procedure: Total hysterectomy with bilateral salpingo-oophorectomy  Specimen Integrity: Intact  Tumor Site: Bilateral ovaries and fallopian tubes    Ovarian Surface Involvement: Present, bilateral  Fallopian Tube Surface Involvement: Present, left  Tumor Size: Greatest dimension 1.2 cm  Histologic Type: Serous carcinoma  Histologic Grade: High grade  Implants: N/A  Other Tissue/ Organ Involvement: Appendix, parametrium  Largest Extrapelvic Peritoneal Focus: Microscopic  Peritoneal/Ascitic Fluid: Positive for malignancy  Treatment Effect: Moderate response; see comment.  Regional Lymph Nodes: No nodes submitted or found  Pathologic Stage Classification (pTNM, AJCC 8th Edition): pT3a pNX pM1b  FIGO: IVB  TNM Descriptors: N/A   Comment:  Chemotherapy Response Score (CRS) is assigned based on evaluation of tumor response in the omentum, which is not submitted in this specimen. In the submitted tissue, there is a partial/moderate tumor response.   B. APPENDIX, APPENDECTOMY:  - TRANSMURAL INVOLVEMENT BY HIGH-GRADE SEROUS ADENOCARCINOMA.  Patient then completed 3 cycles of carbotaxol adjuvantly and is currently on maintenancemvasi   Interval history-patient feels well overall and denies any complaints other than mild fatigue.  Foot drop is improving with physical therapy.  ECOG PS- 1 Pain scale- 0   Review of systems- Review of Systems  Constitutional: Positive for malaise/fatigue. Negative for chills, fever and weight loss.  HENT: Negative for congestion, ear discharge and nosebleeds.   Eyes: Negative for blurred vision.  Respiratory: Negative for cough, hemoptysis, sputum production, shortness of breath and wheezing.   Cardiovascular: Negative for chest pain, palpitations, orthopnea and claudication.  Gastrointestinal: Negative for abdominal pain, blood in stool, constipation, diarrhea, heartburn, melena, nausea and vomiting.  Genitourinary: Negative for dysuria, flank pain, frequency, hematuria and urgency.  Musculoskeletal: Negative for back pain, joint pain and myalgias.  Skin: Negative for rash.  Neurological: Positive  for sensory change (Peripheral neuropathy). Negative for dizziness, tingling, focal weakness, seizures, weakness and headaches.  Endo/Heme/Allergies: Does not bruise/bleed easily.  Psychiatric/Behavioral: Negative for depression  and suicidal ideas. The patient does not have insomnia.       Allergies  Allergen Reactions  . Clarithromycin Nausea Only  . Doxycycline Nausea Only    dizziness  . Penicillins Hives    Did it involve swelling of the face/tongue/throat, SOB, or low BP? No Did it involve sudden or severe rash/hives, skin peeling, or any reaction on the inside of your mouth or nose? No Did you need to seek medical attention at a hospital or doctor's office? No When did it last happen?50+ years ago If all above answers are "NO", may proceed with cephalosporin use.      Past Medical History:  Diagnosis Date  . Allergy   . Anemia   . Complication of anesthesia   . Diabetes mellitus without complication ([place])   . Diverticulosis   . Family history of adverse reaction to anesthesia    mom-n/v  . Family history of lung cancer   . GERD (gastroesophageal reflux disease)    h/o  . Hyperlipidemia   . Hypothyroidism   . Ovarian cancer ([place]) 08/04/2018   Chemo tx's and Hysterectomy.  . Personal history of chemotherapy   . PONV (postoperative nausea and vomiting)    sick with tonsillectomy  . 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 Thyroid disease      Past Surgical History:  Procedure Laterality Date  . ABDOMINAL HYSTERECTOMY    . BACK SURGERY  2007   Lumbar Surgery  . BREAST CYST ASPIRATION Right    Around 10 years ago. Pt thinks it was the right breast but not sure.  Marland Kitchen CATARACT EXTRACTION Left 2010   Right Eye 2011  . COLONOSCOPY WITH PROPOFOL N/A 09/02/2015   Procedure: COLONOSCOPY WITH PROPOFOL;  Surgeon: [name], MD;  Location: [place];  Service: Endoscopy;  Laterality: N/A;  Diabetic - oral meds  . ESOPHAGOGASTRODUODENOSCOPY (EGD) WITH  PROPOFOL N/A 11/03/2015   Procedure: ESOPHAGOGASTRODUODENOSCOPY (EGD) WITH PROPOFOL;  Surgeon: [name], MD;  Location: [place];  Service: Endoscopy;  Laterality: N/A;  . EYE SURGERY Bilateral 06/2014    tear duck catherization ( Dr. Vallarie Mare)  . LAPAROSCOPIC APPENDECTOMY  11/26/2018   Procedure: APPENDECTOMY LAPAROSCOPIC;  Surgeon: Ward, Honor Loh, MD;  Location: [facility];  Service: Gynecology;;  . POLYPECTOMY  09/02/2015   Procedure: POLYPECTOMY;  Surgeon: [name], MD;  Location: [place];  Service: Endoscopy;;  . TONSILLECTOMY  1963    Social History   Socioeconomic History  . Marital status: Single    Spouse name: Not on file  . Number of children: Not on file  . Years of education: Not on file  . Highest education level: Not on file  Occupational History  . Not on file  Tobacco Use  . Smoking status: Never Smoker  . Smokeless tobacco: Never Used  Substance and Sexual Activity  . Alcohol use: No  . Drug use: No  . Sexual activity: Not Currently  Other Topics Concern  . Not on file  Social History Narrative  . Not on file   Social Determinants of Health   Financial Resource Strain:   . Difficulty of Paying Living Expenses:   Food Insecurity:   . Worried About Charity fundraiser in the Last Year:   . Arboriculturist in the Last Year:   Transportation Needs:   . Film/video editor (Medical):   Marland Kitchen Lack of Transportation (Non-Medical):   Physical Activity:   .  Days of Exercise per Week:   . Minutes of Exercise per Session:   Stress:   . Feeling of Stress :   Social Connections:   . Frequency of Communication with Friends and Family:   . Frequency of Social Gatherings with Friends and Family:   . Attends Religious Services:   . Active Member of Clubs or Organizations:   . Attends Archivist Meetings:   Marland Kitchen Marital Status:   Intimate Partner Violence:   . Fear of Current or Ex-Partner:   . Emotionally Abused:   Marland Kitchen  Physically Abused:   . Sexually Abused:     Family History  Problem Relation Age of Onset  . Congestive Heart Failure Mother   . Diabetes Mother   . Stroke Mother   . Hypothyroidism Father   . Cancer Father        lung cancer  . Diabetes Father   . Cancer Paternal Grandmother        stomach  . Breast cancer Neg Hx      Current Outpatient Medications:  .  acetaminophen (TYLENOL) 500 MG tablet, Take 2 tablets (1,000 mg total) by mouth every 6 (six) hours., Disp: , Rfl:  .  aspirin 81 MG tablet, Take 81 mg by mouth daily. , Disp: , Rfl:  .  dexamethasone (DECADRON) 4 MG tablet, Take 2 tablets (8 mg total) by mouth daily. Start the day after carboplatin chemotherapy for 3 days., Disp: 30 tablet, Rfl: 1 .  fexofenadine (ALLEGRA) 60 MG tablet, Take 60 mg by mouth daily as needed for allergies. , Disp: , Rfl:  .  furosemide (LASIX) 20 MG tablet, Take 1 tablet (20 mg total) by mouth daily., Disp: 20 tablet, Rfl: 0 .  gabapentin (NEURONTIN) 300 MG capsule, Take 1 capsule (300 mg total) by mouth at bedtime. Take 2 tablets at bedtime once and then 3 tablets for three nights at bedtime., Disp: 11 capsule, Rfl: 0 .  glipiZIDE (GLUCOTROL) 5 MG tablet, TAKE 1 TABLET BY MOUTH DAILY BEFORE BREAKFAST (Patient taking differently: Take 5 mg by mouth daily before breakfast. ), Disp: 90 tablet, Rfl: 1 .  glucose blood (ONE TOUCH ULTRA TEST) test strip, USE TO TEST BLOOD SUGAR TWICE A DAY AS INSTRUCTED, Disp: 100 each, Rfl: 12 .  ibuprofen (ADVIL) 600 MG tablet, Take 1 tablet (600 mg total) by mouth every 6 (six) hours., Disp: 45 tablet, Rfl: 1 .  insulin degludec (TRESIBA FLEXTOUCH) 100 UNIT/ML SOPN FlexTouch Pen, Inject 15 Units into the skin daily., Disp: , Rfl:  .  levothyroxine (SYNTHROID) 100 MCG tablet, TAKE 1 TABLET BY MOUTH DAILY, Disp: 30 tablet, Rfl: 0 .  MULTIPLE VITAMIN PO, Take 1 tablet by mouth daily. , Disp: , Rfl:  .  ONETOUCH DELICA LANCETS 88C MISC, USE TO TEST BLOOD SUGAR ONCE A DAY,  Disp: 100 each, Rfl: 12 .  simvastatin (ZOCOR) 20 MG tablet, Take 1 tablet (20 mg total) by mouth at bedtime. Please schedule office visit before any future refills, Disp: 90 tablet, Rfl: 0 .  spironolactone (ALDACTONE) 50 MG tablet, Take 1 tablet (50 mg total) by mouth daily., Disp: 20 tablet, Rfl: 0 .  XIIDRA 5 % SOLN, , Disp: , Rfl:  .  calcipotriene (DOVONOX) 0.005 % ointment, Apply 1 application topically as needed. CALCIPOTRIENE, 0.005% (External Ointment) - Historical Medication  apply to skin daily (0.005 %) Active, Disp: , Rfl:  .  LORazepam (ATIVAN) 0.5 MG tablet, Take 1 tablet (0.5 mg total)  by mouth every 6 (six) hours as needed (Nausea or vomiting). (Patient not taking: Reported on 06/08/2019), Disp: 30 tablet, Rfl: 0 .  metFORMIN (GLUCOPHAGE) 500 MG tablet, Take 2 tablets (1,000 mg total) by mouth 2 (two) times daily., Disp: 360 tablet, Rfl: 1 .  ondansetron (ZOFRAN) 8 MG tablet, Take 1 tablet (8 mg total) by mouth 2 (two) times daily as needed for refractory nausea / vomiting. Start on day 3 after carboplatin chemo. (Patient not taking: Reported on 05/18/2019), Disp: 30 tablet, Rfl: 1 .  oxyCODONE (ROXICODONE) 5 MG immediate release tablet, Take 1 tablet (5 mg total) by mouth every 8 (eight) hours as needed. (Patient not taking: Reported on 06/08/2019), Disp: 16 tablet, Rfl: 0 .  prochlorperazine (COMPAZINE) 10 MG tablet, Take 1 tablet (10 mg total) by mouth every 6 (six) hours as needed (Nausea or vomiting). (Patient not taking: Reported on 06/08/2019), Disp: 30 tablet, Rfl: 1 No current facility-administered medications for this visit.  Facility-Administered Medications Ordered in Other Visits:  .  sodium chloride flush (NS) 0.9 % injection 10 mL, 10 mL, Intravenous, Once, Sindy Guadeloupe, MD .  sodium chloride flush (NS) 0.9 % injection 10 mL, 10 mL, Intravenous, Once, Sindy Guadeloupe, MD .  sodium chloride flush (NS) 0.9 % injection 10 mL, 10 mL, Intravenous, Once, Sindy Guadeloupe,  MD  Physical exam:  Vitals:   06/08/19 1004  BP: 135/67  Pulse: 74  Resp: 16  Temp: (!) 96.5 F (35.8 C)  TempSrc: Oral  SpO2: 100%  Weight: 114 lb 4.8 oz (51.8 kg)   Physical Exam Constitutional:      General: She is not in acute distress. Cardiovascular:     Rate and Rhythm: Normal rate and regular rhythm.     Heart sounds: Normal heart sounds.  Pulmonary:     Effort: Pulmonary effort is normal.     Breath sounds: Normal breath sounds.  Abdominal:     General: Bowel sounds are normal.     Palpations: Abdomen is soft.     Comments: Nondistended nontender  Skin:    General: Skin is warm and dry.  Neurological:     Mental Status: She is alert and oriented to person, place, and time.      CMP Latest Ref Rng & Units 06/08/2019  Glucose 70 - 99 mg/dL 130(H)  BUN 8 - 23 mg/dL 15  Creatinine 0.44 - 1.00 mg/dL 0.50  Sodium 135 - 145 mmol/L 131(L)  Potassium 3.5 - 5.1 mmol/L 4.0  Chloride 98 - 111 mmol/L 91(L)  CO2 22 - 32 mmol/L 31  Calcium 8.9 - 10.3 mg/dL 9.6  Total Protein 6.5 - 8.1 g/dL 7.3  Total Bilirubin 0.3 - 1.2 mg/dL 0.5  Alkaline Phos 38 - 126 U/L 45  AST 15 - 41 U/L 26  ALT 0 - 44 U/L 25   CBC Latest Ref Rng & Units 06/08/2019  WBC 4.0 - 10.5 K/uL 6.0  Hemoglobin 12.0 - 15.0 g/dL 12.4  Hematocrit 36.0 - 46.0 % 35.6(L)  Platelets 150 - 400 K/uL 148(L)    No images are attached to the encounter.  CT Chest W Contrast  Result Date: 06/01/2019 CLINICAL DATA:  Restaging ovarian cancer. Initial diagnosis June 2020. EXAM: CT CHEST, ABDOMEN, AND PELVIS WITH CONTRAST TECHNIQUE: Multidetector CT imaging of the chest, abdomen and pelvis was performed following the standard protocol during bolus administration of intravenous contrast. CONTRAST:  46m OMNIPAQUE IOHEXOL 300 MG/ML  SOLN COMPARISON:  CT scan  10/16/2018 FINDINGS: CT CHEST FINDINGS Cardiovascular: The heart is normal in size. No pericardial effusion. Stable tortuosity of the thoracic aorta but no focal  aneurysm or dissection. No atherosclerotic calcifications. The branch vessels are normal. No definite coronary artery calcifications. Mediastinum/Nodes: No mediastinal or hilar mass or adenopathy. The esophagus is grossly normal. Lungs/Pleura: No worrisome pulmonary lesions or pulmonary nodules. No pleural effusion or pleural nodules. Musculoskeletal: Extensive artifact related to midthoracic fusion hardware. No worrisome bone lesions CT ABDOMEN PELVIS FINDINGS Hepatobiliary: No worrisome hepatic lesions. No obvious hepatic peritoneal surface disease. No intra or extrahepatic biliary dilatation. The gallbladder appears normal. Pancreas: No mass, inflammation or ductal dilatation. Spleen: Normal size. No focal lesions. Adrenals/Urinary Tract: The adrenal glands and kidneys are unremarkable. Small right renal calculus is unchanged. The bladder appears normal. Stomach/Bowel: The stomach, duodenum, small bowel and colon are grossly normal. No acute inflammatory changes, mass lesions or obstructive findings. Moderate stool throughout the colon and down into the rectum may suggest constipation. Vascular/Lymphatic: The aorta and branch vessels are patent. Scattered atherosclerotic calcifications. The major venous structures are patent. No mesenteric or retroperitoneal mass or adenopathy is identified. I do not see any obvious omental disease. No peritoneal surface lesions in the abdomen are identified. Reproductive: Surgically absent. Other: Small amount of slightly high attenuation ascites in the right deep pelvis. No pelvic sidewall or inguinal adenopathy. Musculoskeletal: No significant bony findings. IMPRESSION: 1. Small amount of slightly high attenuation ascites in the right deep pelvis. No obvious omental disease or peritoneal surface lesions. 2. No abdominal/pelvic mass or adenopathy. 3. No findings for pulmonary metastatic disease. 4. Moderate stool throughout the colon and down into the rectum may suggest  constipation. Electronically Signed   By: Marijo Sanes M.D.   On: 06/01/2019 12:59   CT ABDOMEN PELVIS W CONTRAST  Result Date: 06/01/2019 CLINICAL DATA:  Restaging ovarian cancer. Initial diagnosis June 2020. EXAM: CT CHEST, ABDOMEN, AND PELVIS WITH CONTRAST TECHNIQUE: Multidetector CT imaging of the chest, abdomen and pelvis was performed following the standard protocol during bolus administration of intravenous contrast. CONTRAST:  85m OMNIPAQUE IOHEXOL 300 MG/ML  SOLN COMPARISON:  CT scan 10/16/2018 FINDINGS: CT CHEST FINDINGS Cardiovascular: The heart is normal in size. No pericardial effusion. Stable tortuosity of the thoracic aorta but no focal aneurysm or dissection. No atherosclerotic calcifications. The branch vessels are normal. No definite coronary artery calcifications. Mediastinum/Nodes: No mediastinal or hilar mass or adenopathy. The esophagus is grossly normal. Lungs/Pleura: No worrisome pulmonary lesions or pulmonary nodules. No pleural effusion or pleural nodules. Musculoskeletal: Extensive artifact related to midthoracic fusion hardware. No worrisome bone lesions CT ABDOMEN PELVIS FINDINGS Hepatobiliary: No worrisome hepatic lesions. No obvious hepatic peritoneal surface disease. No intra or extrahepatic biliary dilatation. The gallbladder appears normal. Pancreas: No mass, inflammation or ductal dilatation. Spleen: Normal size. No focal lesions. Adrenals/Urinary Tract: The adrenal glands and kidneys are unremarkable. Small right renal calculus is unchanged. The bladder appears normal. Stomach/Bowel: The stomach, duodenum, small bowel and colon are grossly normal. No acute inflammatory changes, mass lesions or obstructive findings. Moderate stool throughout the colon and down into the rectum may suggest constipation. Vascular/Lymphatic: The aorta and branch vessels are patent. Scattered atherosclerotic calcifications. The major venous structures are patent. No mesenteric or retroperitoneal  mass or adenopathy is identified. I do not see any obvious omental disease. No peritoneal surface lesions in the abdomen are identified. Reproductive: Surgically absent. Other: Small amount of slightly high attenuation ascites in the right deep pelvis. No pelvic sidewall  or inguinal adenopathy. Musculoskeletal: No significant bony findings. IMPRESSION: 1. Small amount of slightly high attenuation ascites in the right deep pelvis. No obvious omental disease or peritoneal surface lesions. 2. No abdominal/pelvic mass or adenopathy. 3. No findings for pulmonary metastatic disease. 4. Moderate stool throughout the colon and down into the rectum may suggest constipation. Electronically Signed   By: Marijo Sanes M.D.   On: 06/01/2019 12:59     Assessment and plan- Patient is a 64 y.o. 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 Avastin was held for cycle 4. She is s/p robotic assisted total laparoscopic hysterectomy and bilateral salpingo-oophorectomy and appendectomy with extensive lysis of adhesions and abdomen debulking.She has completed 6 cycles of carbotaxol chemotherapy ( 4neoadjuvantly and2adjuvantly).She is here for on treatment assessment prior to cycle 7 of maintenance Mvasi  Patient's CA-125 has been steadily trending up .  It is presently 62 and increased from 50 a month prior I will reviewed CT chest abdomen pelvis images independently.  There is no gross evidence of recurrent disease at this time although there was a small amount of ascites seen in the right deep pelvis.  It is possible that the scans are presently lagging behind the CA-125 and we may be seeing recurrent disease in the near future.  For now we will continue to monitor her CA-125 closely and consider getting a repeat scan in 2 to 3 months.  She will continue Mvasi until then.  Her blood pressure is stable and  urine protein remains trace.  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 Primary high grade serous adenocarcinoma of ovary ([place])   2. Encounter for monoclonal antibody treatment for malignancy   3. Chemotherapy-induced peripheral neuropathy ([place])      Dr. Randa Evens, MD, MPH [facility] [phone] 06/09/2019 1:01 PM

## 2019-06-11 ENCOUNTER — Ambulatory Visit: Payer: Commercial Managed Care - PPO

## 2019-06-11 ENCOUNTER — Other Ambulatory Visit: Payer: Self-pay

## 2019-06-11 DIAGNOSIS — R262 Difficulty in walking, not elsewhere classified: Secondary | ICD-10-CM

## 2019-06-11 DIAGNOSIS — M21371 Foot drop, right foot: Secondary | ICD-10-CM

## 2019-06-11 NOTE — Therapy (Signed)
[facility] [address], [place], [address] Phone: [phone]   Fax:  [phone]  Physical Therapy Treatment  Patient Details  Name: Meredith Jones MRN: [id number] Date of Birth: 12-11-1955 Referring Provider (PT): Janese Banks MD   Encounter Date: 06/11/2019  PT End of Session - 06/11/19 1452    Visit Number  28    Number of Visits  30    Date for PT Re-Evaluation  06/17/19    Authorization Type  8/10    PT Start Time  [id number]    PT Stop Time  1510    PT Time Calculation (min)  33 min    Equipment Utilized During Treatment  Gait belt    Activity Tolerance  Patient tolerated treatment well;No increased pain    Behavior During Therapy  WFL for tasks assessed/performed       Past Medical History:  Diagnosis Date  . Allergy   . Anemia   . Complication of anesthesia   . Diabetes mellitus without complication ([place])   . Diverticulosis   . Family history of adverse reaction to anesthesia    mom-n/v  . Family history of lung cancer   . GERD (gastroesophageal reflux disease)    h/o  . Hyperlipidemia   . Hypothyroidism   . Ovarian cancer ([place]) 08/04/2018   Chemo tx's and Hysterectomy.  . Personal history of chemotherapy   . PONV (postoperative nausea and vomiting)    sick with tonsillectomy  . 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 Thyroid disease     Past Surgical History:  Procedure Laterality Date  . ABDOMINAL HYSTERECTOMY    . BACK SURGERY  2007   Lumbar Surgery  . BREAST CYST ASPIRATION Right    Around 10 years ago. Pt thinks it was the right breast but not sure.  Marland Kitchen CATARACT EXTRACTION Left 2010   Right Eye 2011  . COLONOSCOPY WITH PROPOFOL N/A 09/02/2015   Procedure: COLONOSCOPY WITH PROPOFOL;  Surgeon: [name], MD;  Location: [facility];  Service: Endoscopy;  Laterality: N/A;  Diabetic - oral meds  . ESOPHAGOGASTRODUODENOSCOPY (EGD) WITH PROPOFOL N/A 11/03/2015   Procedure:  ESOPHAGOGASTRODUODENOSCOPY (EGD) WITH PROPOFOL;  Surgeon: [name], MD;  Location: [facility];  Service: Endoscopy;  Laterality: N/A;  . EYE SURGERY Bilateral 06/2014    tear duck catherization ( Dr. Vallarie Mare)  . LAPAROSCOPIC APPENDECTOMY  11/26/2018   Procedure: APPENDECTOMY LAPAROSCOPIC;  Surgeon: Ward, Honor Loh, MD;  Location: [facility];  Service: Gynecology;;  . POLYPECTOMY  09/02/2015   Procedure: POLYPECTOMY;  Surgeon: [name], MD;  Location: [facility];  Service: Endoscopy;;  . TONSILLECTOMY  1963    There were no vitals filed for this visit.  Subjective Assessment - 06/11/19 1440    Subjective  Pt reports she hasbeen busy doing her typical housework and homemaking, cat hearding. She has no pain. She continues to feel more stable on her feet and more energetic.    Pertinent History  CA history, radiation/chemo treatments, neuropathy into the feet    Limitations  Walking    Currently in Pain?  No/denies       INTERVENTION  -Seated dorsiflexion with 9.5lb (unable) 7.5, 2lb unable; 3x10 Rt c 0lb, 3x15 Left c 2lb AW *Right is very tib posterior dominant, subject to total neurological fatigue -Sit to stands with use holding 15# -- 2 x 10 -eyes closed narrow stance balance 2x60sec  -Step ups  onto 8" step with unilateral UE support with high knee - x 10; x10 with CGA -Jogging to tolerance in speed - 8 x 34ft -Tandem walking on balance board pine, unsupported, minGuard assist 4x   PT Short Term Goals - 03/18/19 1708      PT SHORT TERM GOAL #1   Title  Patient will be independent with HEP to continue benefits of therapy after discharge.    Baseline  Dependent with form/technique    Time  6    Period  Weeks    Status  New    Target Date  04/29/19        PT Long Term Goals - 05/06/19 1425      PT LONG TERM GOAL #1   Title  Patient will improve 5xSTS to under 12 sec to decrease fall risk and ability to stand from low surfaces    Baseline  5XSTS: 25sec.  03/18/19: 15.4sec; 04/07/2019: 16.5sec. 04/09/2019: 19.38 sec;  05/06/2019: 12.84sec    Time  6    Period  Weeks    Status  On-going      PT LONG TERM GOAL #2   Title  Patient will improve TUG to under 8 sec to indicate decrease fall risks most notably with transferring to amb.    Baseline  TUG: 12.5sec, 03/18/19: 12.46 seconds; 04/07/2019: Deferred. 04/09/2019: 14.26 sec;  05/06/2019: 10.88 sec    Time  6    Period  Weeks    Status  On-going      PT LONG TERM GOAL #3   Title  Patient will improve ability to perform 71mWT to over 1 m/s to indicate increased safety with community amb.    Baseline  .8 m/s, 03/18/19: .871m/s; 04/07/2019: Deferred. 04/09/2019: 0.74m/s; 05/06/2019: 1.13 m/s    Time  6    Period  Weeks    Status  Achieved      PT LONG TERM GOAL #4   Title  Patient will improve FGA to 27 points to indicate improvement with functional and dynamic gait with exercise.    Baseline  20/30 FGA, 03/18/19 FGA: 18 /30; 2/23/20221: Deferred. 04/09/2019: 20/30; 05/06/2019: 25/30    Time  6    Period  Weeks    Status  On-going      PT LONG TERM GOAL #5   Title  Patient will improve her FOTO score to 77 to indicate significant improvement with her balance.    Baseline  55 FOTO, 03/18/19: 66; 04/07/2019: Defered. 04/09/2019: 58; 05/06/2019: 61    Time  6    Period  Weeks    Status  On-going      PT LONG TERM GOAL #6   Title  Patient will be able to achieve a 4/5 for DF MMT in order for her to ambulate around the home with decreased risk of falling    Baseline  04/09/19: 2-/5 MMT for DF;  05/06/2019: 2/5    Time  6    Period  Weeks    Status  On-going            Plan - 06/11/19 1458    Clinical Impression Statement  Pt able to complete entire session as planned with rest breaks provided as needed. RLE anterior compartment is performing at a much weaker level this date, but extensive verbal cues to maximize quality of movement and minimal compensatory motor patterns. Pt maintains high level of focus  and motivation. Extensive verbal, visual, and tactile cues are provided  for most accurate form possible. Author provides minA intermittently for full ROM when needed. Overall pt continues to make steady progress toward treatment goals.    Personal Factors and Comorbidities  Age;Comorbidity 3+;Past/Current Experience;Other;Comorbidity 2    Comorbidities  hx of CA, neuropathy, drop foot    Examination-Activity Limitations  Carry;Stairs;Stand;Bend    Examination-Participation Restrictions  Cleaning;Community Activity;Yard Work    [employer] officer  Evolving/Moderate complexity    Clinical Decision Making  Moderate    Rehab Potential  Fair    PT Frequency  2x / week    PT Duration  6 weeks    PT Treatment/Interventions  Balance training;Neuromuscular re-education;Functional mobility training;Therapeutic activities;Gait training;Stair training;Electrical Stimulation;Manual techniques;Energy conservation;Patient/family education;Therapeutic exercise    PT Next Visit Plan  Hip strength, dyanmic balance, Squats, low functioning glute med exercises    PT Home Exercise Plan  Sit to stands; SLS; hip abductor strength    Consulted and Agree with Plan of Care  Patient       Patient will benefit from skilled therapeutic intervention in order to improve the following deficits and impairments:  Decreased coordination, Decreased mobility, Decreased endurance, Decreased range of motion, Decreased activity tolerance, Decreased strength, Decreased balance, Difficulty walking, Pain, Abnormal gait  Visit Diagnosis: Foot drop, right  Difficulty in walking, not elsewhere classified     Problem List Patient Active Problem List   Diagnosis Date Noted  . Bilateral primary ovarian cancer ([place]) 02/02/2019  . Genetic testing 12/09/2018  . Status post hysterectomy 11/26/2018  . Need for prophylactic vaccination and inoculation against influenza 11/04/2018  . Anemia of chronic disease 10/22/2018   . B12 deficiency 10/22/2018  . Family history of lung cancer   . 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 Goals of care, counseling/discussion 08/11/2018  . 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 Loss of weight   . Benign neoplasm of descending colon   . Type 2 diabetes mellitus without complication, without long-term current use of insulin ([place]) 06/07/2015  . 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 Cyst of ovary 06/09/2014  . Bulky or enlarged uterus 06/09/2014  . Hypercholesteremia 02/09/2007  . Allergic rhinitis 11/27/2006  . Endometriosis 11/27/2006  . Adult hypothyroidism 11/27/2006   3:06 PM, 06/11/19 Etta Grandchild, PT, DPT Physical Therapist - [place] [phone] (Office)    Etta Grandchild 06/11/2019, 3:02 PM  [facility] [address] [place], [address] Phone: [phone]   Fax:  [phone]  Name: Meredith Jones MRN: [id number] Date of Birth: [birth year]

## 2019-06-15 ENCOUNTER — Ambulatory Visit: Payer: Commercial Managed Care - PPO | Attending: Oncology

## 2019-06-15 ENCOUNTER — Other Ambulatory Visit: Payer: Self-pay

## 2019-06-15 DIAGNOSIS — R262 Difficulty in walking, not elsewhere classified: Secondary | ICD-10-CM | POA: Insufficient documentation

## 2019-06-15 DIAGNOSIS — M21371 Foot drop, right foot: Secondary | ICD-10-CM | POA: Insufficient documentation

## 2019-06-15 NOTE — Therapy (Signed)
[facility] [address] [place], [address] Phone: [phone]   Fax:  [phone]  Physical Therapy Treatment  Patient Details  Name: Meredith Jones MRN: [id number] Date of Birth: 1955-05-26 Referring Provider (PT): Janese Banks MD   Encounter Date: 06/15/2019  PT End of Session - 06/15/19 1322    Visit Number  29    Number of Visits  30    Date for PT Re-Evaluation  06/17/19    Authorization Type  9/10    PT Start Time  1300    PT Stop Time  1345    PT Time Calculation (min)  45 min    Equipment Utilized During Treatment  Gait belt    Activity Tolerance  Patient tolerated treatment well;No increased pain    Behavior During Therapy  WFL for tasks assessed/performed       Past Medical History:  Diagnosis Date  . Allergy   . Anemia   . Complication of anesthesia   . Diabetes mellitus without complication ([place])   . Diverticulosis   . Family history of adverse reaction to anesthesia    mom-n/v  . Family history of lung cancer   . GERD (gastroesophageal reflux disease)    h/o  . Hyperlipidemia   . Hypothyroidism   . Ovarian cancer ([place]) 08/04/2018   Chemo tx's and Hysterectomy.  . Personal history of chemotherapy   . PONV (postoperative nausea and vomiting)    sick with tonsillectomy  . 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 Thyroid disease     Past Surgical History:  Procedure Laterality Date  . ABDOMINAL HYSTERECTOMY    . BACK SURGERY  2007   Lumbar Surgery  . BREAST CYST ASPIRATION Right    Around 10 years ago. Pt thinks it was the right breast but not sure.  Marland Kitchen CATARACT EXTRACTION Left 2010   Right Eye 2011  . COLONOSCOPY WITH PROPOFOL N/A 09/02/2015   Procedure: COLONOSCOPY WITH PROPOFOL;  Surgeon: [name], MD;  Location: [facility];  Service: Endoscopy;  Laterality: N/A;  Diabetic - oral meds  . ESOPHAGOGASTRODUODENOSCOPY (EGD) WITH PROPOFOL N/A 11/03/2015   Procedure:  ESOPHAGOGASTRODUODENOSCOPY (EGD) WITH PROPOFOL;  Surgeon: [name], MD;  Location: [facility];  Service: Endoscopy;  Laterality: N/A;  . EYE SURGERY Bilateral 06/2014    tear duck catherization ( Dr. Vallarie Mare)  . LAPAROSCOPIC APPENDECTOMY  11/26/2018   Procedure: APPENDECTOMY LAPAROSCOPIC;  Surgeon: Ward, Honor Loh, MD;  Location: [facility];  Service: Gynecology;;  . POLYPECTOMY  09/02/2015   Procedure: POLYPECTOMY;  Surgeon: [name], MD;  Location: [facility];  Service: Endoscopy;;  . TONSILLECTOMY  1963    There were no vitals filed for this visit.  Subjective Assessment - 06/15/19 1302    Subjective  Patient reports she had a good session the last session. Patient states she's been performing exercises in standing.    Pertinent History  CA history, radiation/chemo treatments, neuropathy into the feet    Limitations  Walking    Currently in Pain?  No/denies           Therapeutic Exercise Jumping with B UE supprted -- x 10 ; x5 ; x5 without UE support Side stepping up and over 6" step -- x 10; x5 without UE support Seated dorsiflexion in sitting -- x 20;  Jogging to tolerance in speed - 2 x 138ft Sit to stands with use holding 15# -- 2  x 10 Tandem- x21ft  forward Grapevining - x72ft x 2 B with CGA with therapist One foot step behind - 72ft x2  One foot step in front - 35ft x 2    Performed exercises to improve hip strength and balance     PT Education - 06/15/19 1321    Education Details  form/technique with exercise    Person(s) Educated  Patient    Methods  Explanation;Demonstration    Comprehension  Verbalized understanding;Returned demonstration       PT Short Term Goals - 03/18/19 1708      PT SHORT TERM GOAL #1   Title  Patient will be independent with HEP to continue benefits of therapy after discharge.    Baseline  Dependent with form/technique    Time  6    Period  Weeks    Status  New    Target Date  04/29/19        PT Long  Term Goals - 05/06/19 1425      PT LONG TERM GOAL #1   Title  Patient will improve 5xSTS to under 12 sec to decrease fall risk and ability to stand from low surfaces    Baseline  5XSTS: 25sec. 03/18/19: 15.4sec; 04/07/2019: 16.5sec. 04/09/2019: 19.38 sec;  05/06/2019: 12.84sec    Time  6    Period  Weeks    Status  On-going      PT LONG TERM GOAL #2   Title  Patient will improve TUG to under 8 sec to indicate decrease fall risks most notably with transferring to amb.    Baseline  TUG: 12.5sec, 03/18/19: 12.46 seconds; 04/07/2019: Deferred. 04/09/2019: 14.26 sec;  05/06/2019: 10.88 sec    Time  6    Period  Weeks    Status  On-going      PT LONG TERM GOAL #3   Title  Patient will improve ability to perform 9mWT to over 1 m/s to indicate increased safety with community amb.    Baseline  .8 m/s, 03/18/19: .889m/s; 04/07/2019: Deferred. 04/09/2019: 0.25m/s; 05/06/2019: 1.13 m/s    Time  6    Period  Weeks    Status  Achieved      PT LONG TERM GOAL #4   Title  Patient will improve FGA to 27 points to indicate improvement with functional and dynamic gait with exercise.    Baseline  20/30 FGA, 03/18/19 FGA: 18 /30; 2/23/20221: Deferred. 04/09/2019: 20/30; 05/06/2019: 25/30    Time  6    Period  Weeks    Status  On-going      PT LONG TERM GOAL #5   Title  Patient will improve her FOTO score to 77 to indicate significant improvement with her balance.    Baseline  55 FOTO, 03/18/19: 66; 04/07/2019: Defered. 04/09/2019: 58; 05/06/2019: 61    Time  6    Period  Weeks    Status  On-going      PT LONG TERM GOAL #6   Title  Patient will be able to achieve a 4/5 for DF MMT in order for her to ambulate around the home with decreased risk of falling    Baseline  04/09/19: 2-/5 MMT for DF;  05/06/2019: 2/5    Time  6    Period  Weeks    Status  On-going            Plan - 06/15/19 1323    Clinical Impression Statement  Patient is making progress towards  long term goals with greater ability to tolerate  advancement of exercises with only an increase in fatigue. Patient demonstrates good carryover between visists, will perform progress note next visist and discharge with advancement of goals.    Personal Factors and Comorbidities  Age;Comorbidity 3+;Past/Current Experience;Other;Comorbidity 2    Comorbidities  hx of CA, neuropathy, drop foot    Examination-Activity Limitations  Carry;Stairs;Stand;Bend    Examination-Participation Restrictions  Cleaning;Community Activity;Yard Work    [employer]chant [employer] officer  Evolving/Moderate complexity    Rehab Potential  Fair    PT Frequency  2x / week    PT Duration  6 weeks    PT Treatment/Interventions  Balance training;Neuromuscular re-education;Functional mobility training;Therapeutic activities;Gait training;Stair training;Electrical Stimulation;Manual techniques;Energy conservation;Patient/family education;Therapeutic exercise    PT Next Visit Plan  Hip strength, dyanmic balance, Squats, low functioning glute med exercises    PT Home Exercise Plan  Sit to stands; SLS; hip abductor strength    Consulted and Agree with Plan of Care  Patient       Patient will benefit from skilled therapeutic intervention in order to improve the following deficits and impairments:  Decreased coordination, Decreased mobility, Decreased endurance, Decreased range of motion, Decreased activity tolerance, Decreased strength, Decreased balance, Difficulty walking, Pain, Abnormal gait  Visit Diagnosis: Foot drop, right  Difficulty in walking, not elsewhere classified     Problem List Patient Active Problem List   Diagnosis Date Noted  . Bilateral primary ovarian cancer ([place]) 02/02/2019  . Genetic testing 12/09/2018  . Status post hysterectomy 11/26/2018  . Need for prophylactic vaccination and inoculation against influenza 11/04/2018  . Anemia of chronic disease 10/22/2018  . B12 deficiency 10/22/2018  . Family history of lung cancer   . Serous  adenocarcinoma ([place]) 08/13/2018  . Goals of care, counseling/discussion 08/11/2018  . 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 Loss of weight   . Benign neoplasm of descending colon   . Type 2 diabetes mellitus without complication, without long-term current use of insulin ([place]) 06/07/2015  . 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 Cyst of ovary 06/09/2014  . Bulky or enlarged uterus 06/09/2014  . Hypercholesteremia 02/09/2007  . Allergic rhinitis 11/27/2006  . Endometriosis 11/27/2006  . Adult hypothyroidism 11/27/2006    Blythe Stanford, PT DPT 06/15/2019, 1:33 PM  [facility] [address] [place], [address] Phone: [phone]   Fax:  [phone]  Name: Meredith Jones MRN: [id number] Date of Birth: [birth year]

## 2019-06-17 ENCOUNTER — Ambulatory Visit: Payer: Commercial Managed Care - PPO

## 2019-06-17 ENCOUNTER — Other Ambulatory Visit: Payer: Self-pay

## 2019-06-17 DIAGNOSIS — R262 Difficulty in walking, not elsewhere classified: Secondary | ICD-10-CM

## 2019-06-17 DIAGNOSIS — M21371 Foot drop, right foot: Secondary | ICD-10-CM

## 2019-06-17 NOTE — Therapy (Addendum)
[facility] [address] [place], [address] Phone: [phone]   Fax:  [phone]  Physical Therapy Treatment/Progress Note  Patient Details  Name: Meredith Jones MRN: [id number] Date of Birth: 11/11/1955 Referring Provider (PT): Janese Banks MD  reporitng period: 05/06/2019 - 06/17/2019  Encounter Date: 06/17/2019  PT End of Session - 06/17/19 1329    Visit Number  30    Number of Visits  30    Date for PT Re-Evaluation  06/17/19    Authorization Type  10/10    PT Start Time  1300    PT Stop Time  1345    PT Time Calculation (min)  45 min    Equipment Utilized During Treatment  Gait belt    Activity Tolerance  Patient tolerated treatment well;No increased pain    Behavior During Therapy  WFL for tasks assessed/performed       Past Medical History:  Diagnosis Date  . Allergy   . Anemia   . Complication of anesthesia   . Diabetes mellitus without complication ([place])   . Diverticulosis   . Family history of adverse reaction to anesthesia    mom-n/v  . Family history of lung cancer   . GERD (gastroesophageal reflux disease)    h/o  . Hyperlipidemia   . Hypothyroidism   . Ovarian cancer ([place]) 08/04/2018   Chemo tx's and Hysterectomy.  . Personal history of chemotherapy   . PONV (postoperative nausea and vomiting)    sick with tonsillectomy  . 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 Thyroid disease     Past Surgical History:  Procedure Laterality Date  . ABDOMINAL HYSTERECTOMY    . BACK SURGERY  2007   Lumbar Surgery  . BREAST CYST ASPIRATION Right    Around 10 years ago. Pt thinks it was the right breast but not sure.  Marland Kitchen CATARACT EXTRACTION Left 2010   Right Eye 2011  . COLONOSCOPY WITH PROPOFOL N/A 09/02/2015   Procedure: COLONOSCOPY WITH PROPOFOL;  Surgeon: [name], MD;  Location: [facility];  Service: Endoscopy;  Laterality: N/A;  Diabetic - oral meds  .  ESOPHAGOGASTRODUODENOSCOPY (EGD) WITH PROPOFOL N/A 11/03/2015   Procedure: ESOPHAGOGASTRODUODENOSCOPY (EGD) WITH PROPOFOL;  Surgeon: [name], MD;  Location: [facility];  Service: Endoscopy;  Laterality: N/A;  . EYE SURGERY Bilateral 06/2014    tear duck catherization ( Dr. Vallarie Mare)  . LAPAROSCOPIC APPENDECTOMY  11/26/2018   Procedure: APPENDECTOMY LAPAROSCOPIC;  Surgeon: Ward, Honor Loh, MD;  Location: [facility];  Service: Gynecology;;  . POLYPECTOMY  09/02/2015   Procedure: POLYPECTOMY;  Surgeon: [name], MD;  Location: [facility];  Service: Endoscopy;;  . TONSILLECTOMY  1963    There were no vitals filed for this visit.  Subjective Assessment - 06/17/19 1305    Subjective  Patient states she is prepared for discharge. Patient states she's been using her AFO for long distances.    Pertinent History  CA history, radiation/chemo treatments, neuropathy into the feet    Limitations  Walking    How long can you walk comfortably?  Increased falls    Patient Stated Goals  To decrease walking quality    Currently in Pain?  No/denies          TREATMENT Therapeutic Exercise Sit to stand - 2 x 5  Tandem with  intermittent UE support - 3 x 30sec  Going from sitting to standing  -- 66f  x 2  Squats with UE support - x20 Single leg stance with intermittent UE support - x 20  Tandem amb - x 21f Ascending/desending stairs - x 4 with intermittent UE support  Performed exercises to review HEP to strengthen and improve balance      PT Education - 06/17/19 1307    Education Details  form/technique with exercise; reviewed HEP for exercise    Person(s) Educated  Patient    Methods  Explanation;Demonstration    Comprehension  Verbalized understanding;Returned demonstration       PT Short Term Goals - 03/18/19 1708      PT SHORT TERM GOAL #1   Title  Patient will be independent with HEP to continue benefits of therapy after discharge.    Baseline  Dependent with  form/technique    Time  6    Period  Weeks    Status  New    Target Date  04/29/19        PT Long Term Goals - 06/17/19 1308      PT LONG TERM GOAL #1   Title  Patient will improve 5xSTS to under 12 sec to decrease fall risk and ability to stand from low surfaces    Baseline  5XSTS: 25sec. 03/18/19: 15.4sec; 04/07/2019: 16.5sec. 04/09/2019: 19.38 sec;  05/06/2019: 12.84sec; 06/17/2019: 11.5sec    Time  6    Period  Weeks    Status  Achieved      PT LONG TERM GOAL #2   Title  Patient will improve TUG to under 8 sec to indicate decrease fall risks most notably with transferring to amb.    Baseline  TUG: 12.5sec, 03/18/19: 12.46 seconds; 04/07/2019: Deferred. 04/09/2019: 14.26 sec;  05/06/2019: 10.88 sec; 06/17/19: 8.5 sec    Time  6    Period  Weeks    Status  Partially Met      PT LONG TERM GOAL #3   Title  Patient will improve ability to perform 184m to over 1 m/s to indicate increased safety with community amb.    Baseline  .8 m/s, 03/18/19: .87755m 04/07/2019: Deferred. 04/09/2019: 0.59m51m3/24/2021: 1.13 m/s    Time  6    Period  Weeks    Status  Achieved      PT LONG TERM GOAL #4   Title  Patient will improve FGA to 27 points to indicate improvement with functional and dynamic gait with exercise.    Baseline  20/30 FGA, 03/18/19 FGA: 18 /30; 2/23/20221: Deferred. 04/09/2019: 20/30; 05/06/2019: 25/30; 27/30    Time  6    Period  Weeks    Status  On-going      PT LONG TERM GOAL #5   Title  Patient will improve her FOTO score to 77 to indicate significant improvement with her balance.    Baseline  55 FOTO, 03/18/19: 66; 04/07/2019: Defered. 04/09/2019: 58; 05/06/2019: 61; 06/17/2019: 68    Time  6    Period  Weeks    Status  Partially Met      PT LONG TERM GOAL #6   Title  Patient will be able to achieve a 4/5 for DF MMT in order for her to ambulate around the home with decreased risk of falling    Baseline  04/09/19: 2-/5 MMT for DF;  05/06/2019: 2/5; 06/17/2019: 4/5    Time  6    Period  Weeks     Status  Achieved  Plan - 06/17/19 1336    Clinical Impression Statement  Patient demonstrates significant improvement with FGA, sit to stands, and mmt strength along the tibialis anterior on the affected side. Patient has either met or paritially met all long term goals and is less of a fall risk compared to previous sessions. Patient is to be DC from physical therapy.    Personal Factors and Comorbidities  Age;Comorbidity 3+;Past/Current Experience;Other;Comorbidity 2    Comorbidities  hx of CA, neuropathy, drop foot    Examination-Activity Limitations  Carry;Stairs;Stand;Bend    Examination-Participation Restrictions  Cleaning;Community Activity;Yard Work    [employer]  Evolving/Moderate complexity    Rehab Potential  Fair    PT Frequency  2x / week    PT Duration  6 weeks    PT Treatment/Interventions  Balance training;Neuromuscular re-education;Functional mobility training;Therapeutic activities;Gait training;Stair training;Electrical Stimulation;Manual techniques;Energy conservation;Patient/family education;Therapeutic exercise    PT Next Visit Plan  Hip strength, dyanmic balance, Squats, low functioning glute med exercises    PT Home Exercise Plan  Sit to stands; SLS; hip abductor strength    Consulted and Agree with Plan of Care  Patient       Patient will benefit from skilled therapeutic intervention in order to improve the following deficits and impairments:  Decreased coordination, Decreased mobility, Decreased endurance, Decreased range of motion, Decreased activity tolerance, Decreased strength, Decreased balance, Difficulty walking, Pain, Abnormal gait  Visit Diagnosis: Foot drop, right  Difficulty in walking, not elsewhere classified     Problem List Patient Active Problem List   Diagnosis Date Noted  . Bilateral primary ovarian cancer ([place]) 02/02/2019  . Genetic testing 12/09/2018  . Status post hysterectomy 11/26/2018  .  Need for prophylactic vaccination and inoculation against influenza 11/04/2018  . Anemia of chronic disease 10/22/2018  . B12 deficiency 10/22/2018  . Family history of lung cancer   . 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 Goals of care, counseling/discussion 08/11/2018  . 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 Loss of weight   . Benign neoplasm of descending colon   . Type 2 diabetes mellitus without complication, without long-term current use of insulin ([place]) 06/07/2015  . 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 Cyst of ovary 06/09/2014  . Bulky or enlarged uterus 06/09/2014  . Hypercholesteremia 02/09/2007  . Allergic rhinitis 11/27/2006  . Endometriosis 11/27/2006  . Adult hypothyroidism 11/27/2006    Blythe Stanford, PT DPT 06/17/2019, 1:41 PM  [facility] [address] [place], [address] Phone: [phone]   Fax:  [phone]  Name: Meredith Jones MRN: [id number] Date of Birth: 1955-09-10

## 2019-06-22 ENCOUNTER — Ambulatory Visit: Payer: Commercial Managed Care - PPO

## 2019-06-24 ENCOUNTER — Ambulatory Visit: Payer: Commercial Managed Care - PPO

## 2019-06-29 ENCOUNTER — Inpatient Hospital Stay: Payer: Commercial Managed Care - PPO

## 2019-06-29 ENCOUNTER — Inpatient Hospital Stay: Payer: Commercial Managed Care - PPO | Attending: Oncology

## 2019-06-29 ENCOUNTER — Telehealth: Payer: Self-pay

## 2019-06-29 ENCOUNTER — Encounter: Payer: Self-pay | Admitting: Oncology

## 2019-06-29 ENCOUNTER — Other Ambulatory Visit: Payer: Self-pay | Admitting: Internal Medicine

## 2019-06-29 ENCOUNTER — Inpatient Hospital Stay (HOSPITAL_BASED_OUTPATIENT_CLINIC_OR_DEPARTMENT_OTHER): Payer: Commercial Managed Care - PPO | Admitting: Oncology

## 2019-06-29 ENCOUNTER — Other Ambulatory Visit: Payer: Self-pay | Admitting: *Deleted

## 2019-06-29 ENCOUNTER — Other Ambulatory Visit: Payer: Self-pay

## 2019-06-29 VITALS — BP 109/62 | HR 112 | Temp 98.0°F | Resp 18 | Wt 119.5 lb

## 2019-06-29 DIAGNOSIS — R180 Malignant ascites: Secondary | ICD-10-CM

## 2019-06-29 DIAGNOSIS — C801 Malignant (primary) neoplasm, unspecified: Secondary | ICD-10-CM | POA: Diagnosis not present

## 2019-06-29 DIAGNOSIS — R140 Abdominal distension (gaseous): Secondary | ICD-10-CM | POA: Insufficient documentation

## 2019-06-29 DIAGNOSIS — C569 Malignant neoplasm of unspecified ovary: Secondary | ICD-10-CM | POA: Insufficient documentation

## 2019-06-29 DIAGNOSIS — G893 Neoplasm related pain (acute) (chronic): Secondary | ICD-10-CM | POA: Diagnosis not present

## 2019-06-29 DIAGNOSIS — Z7189 Other specified counseling: Secondary | ICD-10-CM

## 2019-06-29 DIAGNOSIS — R188 Other ascites: Secondary | ICD-10-CM | POA: Insufficient documentation

## 2019-06-29 LAB — COMPREHENSIVE METABOLIC PANEL
ALT: 23 U/L (ref 0–44)
AST: 30 U/L (ref 15–41)
Albumin: 2.5 g/dL — ABNORMAL LOW (ref 3.5–5.0)
Alkaline Phosphatase: 73 U/L (ref 38–126)
Anion gap: 14 (ref 5–15)
BUN: 55 mg/dL — ABNORMAL HIGH (ref 8–23)
CO2: 23 mmol/L (ref 22–32)
Calcium: 8.9 mg/dL (ref 8.9–10.3)
Chloride: 95 mmol/L — ABNORMAL LOW (ref 98–111)
Creatinine, Ser: 1.17 mg/dL — ABNORMAL HIGH (ref 0.44–1.00)
GFR calc Af Amer: 57 mL/min — ABNORMAL LOW (ref >60)
GFR calc non Af Amer: 49 mL/min — ABNORMAL LOW (ref >60)
Glucose, Bld: 188 mg/dL — ABNORMAL HIGH (ref 70–99)
Potassium: 3.7 mmol/L (ref 3.5–5.1)
Sodium: 132 mmol/L — ABNORMAL LOW (ref 135–145)
Total Bilirubin: 0.5 mg/dL (ref 0.3–1.2)
Total Protein: 6.3 g/dL — ABNORMAL LOW (ref 6.5–8.1)

## 2019-06-29 LAB — CBC WITH DIFFERENTIAL/PLATELET
Abs Immature Granulocytes: 0 10*3/uL (ref 0.00–0.07)
Band Neutrophils: 34 %
Basophils Absolute: 0 10*3/uL (ref 0.0–0.1)
Basophils Relative: 0 %
Eosinophils Absolute: 0 10*3/uL (ref 0.0–0.5)
Eosinophils Relative: 0 %
HCT: 33.9 % — ABNORMAL LOW (ref 36.0–46.0)
Hemoglobin: 11.7 g/dL — ABNORMAL LOW (ref 12.0–15.0)
Lymphocytes Relative: 10 %
Lymphs Abs: 0.8 10*3/uL (ref 0.7–4.0)
MCH: 34.3 pg — ABNORMAL HIGH (ref 26.0–34.0)
MCHC: 34.5 g/dL (ref 30.0–36.0)
MCV: 99.4 fL (ref 80.0–100.0)
Monocytes Absolute: 0.7 10*3/uL (ref 0.1–1.0)
Monocytes Relative: 9 %
Neutro Abs: 6.2 10*3/uL (ref 1.7–7.7)
Neutrophils Relative %: 47 %
Platelets: 304 10*3/uL (ref 150–400)
RBC: 3.41 MIL/uL — ABNORMAL LOW (ref 3.87–5.11)
RDW: 13.3 % (ref 11.5–15.5)
Smear Review: NORMAL
WBC: 7.7 10*3/uL (ref 4.0–10.5)
nRBC: 0 % (ref 0.0–0.2)

## 2019-06-29 LAB — PROTEIN, URINE, RANDOM: Total Protein, Urine: 29 mg/dL

## 2019-06-29 MED ORDER — OXYCODONE HCL 5 MG PO TABS: 5.0000 mg | ORAL_TABLET | Freq: Four times a day (QID) | ORAL | 0 refills | Status: DC | PRN

## 2019-06-29 NOTE — Progress Notes (Signed)
Patient here for oncology follow-up appointment, expresses complaints of pain in stomach at times and concerns of extreme fatigue.

## 2019-06-30 ENCOUNTER — Ambulatory Visit
Admission: RE | Admit: 2019-06-30 | Discharge: 2019-06-30 | Disposition: A | Discharge status: Home / Self Care | Payer: Commercial Managed Care - PPO | Source: Ambulatory Visit | Attending: Oncology | Admitting: Oncology

## 2019-06-30 DIAGNOSIS — C561 Malignant neoplasm of right ovary: Secondary | ICD-10-CM | POA: Insufficient documentation

## 2019-06-30 DIAGNOSIS — C801 Malignant (primary) neoplasm, unspecified: Secondary | ICD-10-CM

## 2019-06-30 DIAGNOSIS — C7982 Secondary malignant neoplasm of genital organs: Secondary | ICD-10-CM | POA: Insufficient documentation

## 2019-06-30 DIAGNOSIS — C786 Secondary malignant neoplasm of retroperitoneum and peritoneum: Secondary | ICD-10-CM | POA: Insufficient documentation

## 2019-06-30 DIAGNOSIS — C562 Malignant neoplasm of left ovary: Secondary | ICD-10-CM | POA: Insufficient documentation

## 2019-06-30 DIAGNOSIS — R180 Malignant ascites: Secondary | ICD-10-CM | POA: Insufficient documentation

## 2019-06-30 LAB — CA 125: Cancer Antigen (CA) 125: 110 U/mL — ABNORMAL HIGH (ref 0.0–38.1)

## 2019-06-30 NOTE — Procedures (Signed)
PROCEDURE SUMMARY:  Successful US guided paracentesis from right lateral abdomen.  Yielded 1 liter of clear yellow fluid.  No immediate complications.  Patient tolerated well.  EBL = trace  Specimen was sent for labs.  Tajanae Guilbault S Tashonda Pinkus PA-C 06/30/2019 11:22 AM

## 2019-06-30 NOTE — Progress Notes (Signed)
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 She presented with symptoms of bilateral lower extremity swelling as well as abdominal distention.GI. This was followed by a CT abdomen and chest imaging with contrast. CT showed cystic lesions of the enlarged bilateral ovaries the largest lesion on the left measuring 6.2 cm. Patient also noted to have numerous bulky retroperitoneal lymph nodes, largest left periaortic node or conglomerates near the superior mesenteric artery measuring at least 4.4 x 2.9 cm. Significant ascites was present. Patient underwent paracentesis and cytology from the fluid showed high-grade serous carcinoma. Tumor cells were positive for CK7, PAX 8, p53 and WT 1.  Patient started neoadjuvant carbotaxol and Avastin chemotherapy on 08/21/2018 and completed 4 cycles on 11/02/2018. Patient did not receive Avastin for cycle 4.  Pathology: DIAGNOSIS:  A. UTERUS AND CERVIX WITH BILATERAL FALLOPIAN TUBES AND OVARIES;  HYSTERECTOMY WITH BILATERAL SALPINGO-OOPHORECTOMY:  - HIGH-GRADE SEROUS ADENOCARCINOMA.  - REFER TO CANCER SUMMARY BELOW.   CANCER CASE SUMMARY: OVARY or FALLOPIAN TUBE or PRIMARY PERITONEUM  Procedure: Total hysterectomy with bilateral salpingo-oophorectomy  Specimen Integrity: Intact  Tumor Site: Bilateral ovaries and fallopian tubes    Ovarian Surface Involvement: Present, bilateral  Fallopian Tube Surface Involvement: Present, left  Tumor Size: Greatest dimension 1.2 cm  Histologic Type: Serous carcinoma  Histologic Grade: High grade  Implants: N/A  Other Tissue/ Organ Involvement: Appendix, parametrium  Largest Extrapelvic Peritoneal Focus: Microscopic  Peritoneal/Ascitic Fluid: Positive for malignancy  Treatment Effect: Moderate response; see comment.  Regional Lymph Nodes: No nodes submitted or found  Pathologic Stage Classification (pTNM, AJCC 8th Edition): pT3a pNX pM1b  FIGO: IVB  TNM Descriptors: N/A   Comment:  Chemotherapy Response Score (CRS) is assigned based on evaluation of tumor response in the omentum, which is not submitted in this specimen. In the submitted tissue, there is a partial/moderate tumor response.   B. APPENDIX, APPENDECTOMY:  - TRANSMURAL INVOLVEMENT BY HIGH-GRADE SEROUS ADENOCARCINOMA.  Patient then completed 3 cycles of carbotaxol adjuvantly on 01/12/20 and is currently on maintenance Mvasi.  Genetic testing negative for APC, ATM, AXIN2, BARD1, BMPR1A, BRIP1, BRCA1, BRCA2, CDH1, CDK4, CDKN2A, CHEK2, EPCAM (large rearramgement only), HOXB13 (sequencing only), GALNT12, MLH1, MSH2, MSH3 (excluding repetitive portions of exon1) MSH6, MUTYH, NBN, NTHL1, PALB2, PMS2, PTEN, RAD51C, RAD51D, RNF43, SMAD4, STK11, TP53. Sequencing for select regions of POLE and POLD1, and large rearrangement analysis for select regions of GREM1 were negative   Tumor testing negative for  BRCA1, BRCA2; GIS negative    Interval history-patient currently complains of back pain.  Her abdomen feels distended again.  She feels uncomfortable because of the distention.  ECOG PS- 1 Pain scale- 4 Opioid associated constipation- no  Review of systems- Review of Systems  Constitutional: Positive for malaise/fatigue. Negative for chills, fever and weight loss.  HENT: Negative for congestion, ear discharge and  nosebleeds.   Eyes: Negative for blurred vision.  Respiratory: Negative for cough, hemoptysis, sputum production, shortness of breath  and wheezing.   Cardiovascular: Negative for chest pain, palpitations, orthopnea and claudication.  Gastrointestinal: Negative for abdominal pain, blood in stool, constipation, diarrhea, heartburn, melena, nausea and vomiting.       Abdominal distention  Genitourinary: Negative for dysuria, flank pain, frequency, hematuria and urgency.  Musculoskeletal: Positive for back pain. Negative for joint pain and myalgias.  Skin: Negative for rash.  Neurological: Negative for dizziness, tingling, focal weakness, seizures, weakness and headaches.  Endo/Heme/Allergies: Does not bruise/bleed easily.  Psychiatric/Behavioral: Negative for depression and suicidal ideas. The patient does not have insomnia.       Allergies  Allergen Reactions  . Clarithromycin Nausea Only  . Doxycycline Nausea Only    dizziness  . Penicillins Hives    Did it involve swelling of the face/tongue/throat, SOB, or low BP? No Did it involve sudden or severe rash/hives, skin peeling, or any reaction on the inside of your mouth or nose? No Did you need to seek medical attention at a hospital or doctor's office? No When did it last happen?50+ years ago If all above answers are "NO", may proceed with cephalosporin use.      Past Medical History:  Diagnosis Date  . Allergy   . Anemia   . Complication of anesthesia   . Diabetes mellitus without complication ([place])   . Diverticulosis   . Family history of adverse reaction to anesthesia    mom-n/v  . Family history of lung cancer   . GERD (gastroesophageal reflux disease)    h/o  . Hyperlipidemia   . Hypothyroidism   . Ovarian cancer ([place]) 08/04/2018   Chemo tx's and Hysterectomy.  . Personal history of chemotherapy   . PONV (postoperative nausea and vomiting)    sick with tonsillectomy  . 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 Thyroid disease      Past Surgical History:  Procedure Laterality Date  . ABDOMINAL HYSTERECTOMY    . BACK SURGERY  2007   Lumbar Surgery  . BREAST CYST ASPIRATION Right    Around 10 years ago. Pt thinks it was the right breast but not sure.  Marland Kitchen CATARACT EXTRACTION Left 2010   Right Eye 2011  . COLONOSCOPY WITH PROPOFOL N/A 09/02/2015   Procedure: COLONOSCOPY WITH PROPOFOL;  Surgeon: [name], MD;  Location: [place];  Service: Endoscopy;  Laterality: N/A;  Diabetic - oral meds  . ESOPHAGOGASTRODUODENOSCOPY (EGD) WITH PROPOFOL N/A 11/03/2015   Procedure: ESOPHAGOGASTRODUODENOSCOPY (EGD) WITH PROPOFOL;  Surgeon: [name], MD;  Location: [place];  Service: Endoscopy;  Laterality: N/A;  . EYE SURGERY Bilateral 06/2014    tear duck catherization ( Dr. Vallarie Mare)  . LAPAROSCOPIC APPENDECTOMY  11/26/2018   Procedure: APPENDECTOMY LAPAROSCOPIC;  Surgeon: Ward, Honor Loh, MD;  Location: [facility];  Service: Gynecology;;  . POLYPECTOMY  09/02/2015   Procedure: POLYPECTOMY;  Surgeon: [name], MD;  Location: [place];  Service: Endoscopy;;  . TONSILLECTOMY  1963    Social History   Socioeconomic History  . Marital status: Single    Spouse name: Not on file  . Number of children: Not on file  . Years of education: Not on file  . Highest education level: Not on file  Occupational History  . Not on file  Tobacco Use  . Smoking status: Never Smoker  . Smokeless tobacco: Never Used  Substance and Sexual Activity  . Alcohol use: No  . Drug use: No  . Sexual activity: Not Currently  Other  Topics Concern  . Not on file  Social History Narrative  . Not on file   Social Determinants of Health   Financial Resource Strain:   . Difficulty of Paying Living Expenses:   Food Insecurity:   . Worried About Charity fundraiser in the Last Year:   . Arboriculturist in the Last Year:   Transportation Needs:   . Film/video editor (Medical):   Marland Kitchen  Lack of Transportation (Non-Medical):   Physical Activity:   . Days of Exercise per Week:   . Minutes of Exercise per Session:   Stress:   . Feeling of Stress :   Social Connections:   . Frequency of Communication with Friends and Family:   . Frequency of Social Gatherings with Friends and Family:   . Attends Religious Services:   . Active Member of Clubs or Organizations:   . Attends Archivist Meetings:   Marland Kitchen Marital Status:   Intimate Partner Violence:   . Fear of Current or Ex-Partner:   . Emotionally Abused:   Marland Kitchen Physically Abused:   . Sexually Abused:     Family History  Problem Relation Age of Onset  . Congestive Heart Failure Mother   . Diabetes Mother   . Stroke Mother   . Hypothyroidism Father   . Cancer Father        lung cancer  . Diabetes Father   . Cancer Paternal Grandmother        stomach  . Breast cancer Neg Hx      Current Outpatient Medications:  .  acetaminophen (TYLENOL) 500 MG tablet, Take 2 tablets (1,000 mg total) by mouth every 6 (six) hours., Disp: , Rfl:  .  aspirin 81 MG tablet, Take 81 mg by mouth daily. , Disp: , Rfl:  .  dexamethasone (DECADRON) 4 MG tablet, Take 2 tablets (8 mg total) by mouth daily. Start the day after carboplatin chemotherapy for 3 days., Disp: 30 tablet, Rfl: 1 .  glipiZIDE (GLUCOTROL) 5 MG tablet, TAKE 1 TABLET BY MOUTH DAILY BEFORE BREAKFAST (Patient taking differently: Take 5 mg by mouth daily before breakfast. ), Disp: 90 tablet, Rfl: 1 .  glucose blood (ONE TOUCH ULTRA TEST) test strip, USE TO TEST BLOOD SUGAR TWICE A DAY AS INSTRUCTED, Disp: 100 each, Rfl: 12 .  ibuprofen (ADVIL) 600 MG tablet, Take 1 tablet (600 mg total) by mouth every 6 (six) hours., Disp: 45 tablet, Rfl: 1 .  insulin degludec (TRESIBA FLEXTOUCH) 100 UNIT/ML SOPN FlexTouch Pen, Inject 15 Units into the skin daily., Disp: , Rfl:  .  levothyroxine (SYNTHROID) 100 MCG tablet, TAKE 1 TABLET BY MOUTH DAILY, Disp: 30 tablet, Rfl: 0 .  metFORMIN  (GLUCOPHAGE) 500 MG tablet, Take 2 tablets (1,000 mg total) by mouth 2 (two) times daily., Disp: 360 tablet, Rfl: 1 .  MULTIPLE VITAMIN PO, Take 1 tablet by mouth daily. , Disp: , Rfl:  .  simvastatin (ZOCOR) 20 MG tablet, Take 1 tablet (20 mg total) by mouth at bedtime. Please schedule office visit before any future refills, Disp: 90 tablet, Rfl: 0 .  XIIDRA 5 % SOLN, , Disp: , Rfl:  .  calcipotriene (DOVONOX) 0.005 % ointment, Apply 1 application topically as needed. CALCIPOTRIENE, 0.005% (External Ointment) - Historical Medication  apply to skin daily (0.005 %) Active, Disp: , Rfl:  .  fexofenadine (ALLEGRA) 60 MG tablet, Take 60 mg by mouth daily as needed for allergies. , Disp: , Rfl:  .  furosemide (LASIX) 20 MG tablet, Take 1 tablet (20 mg total) by mouth daily., Disp: 20 tablet, Rfl: 0 .  gabapentin (NEURONTIN) 300 MG capsule, Take 1 capsule (300 mg total) by mouth at bedtime. Take 2 tablets at bedtime once and then 3 tablets for three nights at bedtime., Disp: 11 capsule, Rfl: 0 .  LORazepam (ATIVAN) 0.5 MG tablet, Take 1 tablet (0.5 mg total) by mouth every 6 (six) hours as needed (Nausea or vomiting). (Patient not taking: Reported on 06/08/2019), Disp: 30 tablet, Rfl: 0 .  ondansetron (ZOFRAN) 8 MG tablet, Take 1 tablet (8 mg total) by mouth 2 (two) times daily as needed for refractory nausea / vomiting. Start on day 3 after carboplatin chemo. (Patient not taking: Reported on 05/18/2019), Disp: 30 tablet, Rfl: 1 .  ONETOUCH DELICA LANCETS 75I MISC, USE TO TEST BLOOD SUGAR ONCE A DAY, Disp: 100 each, Rfl: 12 .  oxyCODONE (ROXICODONE) 5 MG immediate release tablet, Take 1 tablet (5 mg total) by mouth every 6 (six) hours as needed for moderate pain., Disp: 60 tablet, Rfl: 0 .  prochlorperazine (COMPAZINE) 10 MG tablet, Take 1 tablet (10 mg total) by mouth every 6 (six) hours as needed (Nausea or vomiting). (Patient not taking: Reported on 06/08/2019), Disp: 30 tablet, Rfl: 1 .  spironolactone  (ALDACTONE) 50 MG tablet, Take 1 tablet (50 mg total) by mouth daily. (Patient not taking: Reported on 06/29/2019), Disp: 20 tablet, Rfl: 0 No current facility-administered medications for this visit.  Facility-Administered Medications Ordered in Other Visits:  .  sodium chloride flush (NS) 0.9 % injection 10 mL, 10 mL, Intravenous, Once, Sindy Guadeloupe, MD .  sodium chloride flush (NS) 0.9 % injection 10 mL, 10 mL, Intravenous, Once, Sindy Guadeloupe, MD .  sodium chloride flush (NS) 0.9 % injection 10 mL, 10 mL, Intravenous, Once, Sindy Guadeloupe, MD  Physical exam:  Vitals:   06/29/19 0908  BP: 109/62  Pulse: (!) 112  Resp: 18  Temp: 98 F (36.7 C)  TempSrc: Tympanic  SpO2: 100%  Weight: 119 lb 8 oz (54.2 kg)   Physical Exam Constitutional:      General: She is not in acute distress. Cardiovascular:     Rate and Rhythm: Normal rate and regular rhythm.     Heart sounds: Normal heart sounds.  Pulmonary:     Effort: Pulmonary effort is normal.     Breath sounds: Normal breath sounds.  Abdominal:     Comments: Distended.  Tense ascites+  Musculoskeletal:     Cervical back: Normal range of motion.  Skin:    General: Skin is warm and dry.  Neurological:     Mental Status: She is alert and oriented to person, place, and time.      CMP Latest Ref Rng & Units 06/29/2019  Glucose 70 - 99 mg/dL 188(H)  BUN 8 - 23 mg/dL 55(H)  Creatinine 0.44 - 1.00 mg/dL 1.17(H)  Sodium 135 - 145 mmol/L 132(L)  Potassium 3.5 - 5.1 mmol/L 3.7  Chloride 98 - 111 mmol/L 95(L)  CO2 22 - 32 mmol/L 23  Calcium 8.9 - 10.3 mg/dL 8.9  Total Protein 6.5 - 8.1 g/dL 6.3(L)  Total Bilirubin 0.3 - 1.2 mg/dL 0.5  Alkaline Phos 38 - 126 U/L 73  AST 15 - 41 U/L 30  ALT 0 - 44 U/L 23   CBC Latest Ref Rng & Units 06/29/2019  WBC 4.0 - 10.5 K/uL 7.7  Hemoglobin 12.0 - 15.0 g/dL 11.7(L)  Hematocrit 36.0 - 46.0 % 33.9(L)  Platelets 150 - 400 K/uL 304    No images are attached to the encounter.  CT Chest W  Contrast  Result Date: 06/01/2019 CLINICAL DATA:  Restaging ovarian cancer. Initial diagnosis June 2020. EXAM: CT CHEST, ABDOMEN, AND PELVIS WITH CONTRAST TECHNIQUE: Multidetector CT imaging of the chest, abdomen and pelvis was performed following the standard protocol during bolus administration of intravenous contrast. CONTRAST:  61m OMNIPAQUE IOHEXOL 300 MG/ML  SOLN COMPARISON:  CT scan 10/16/2018 FINDINGS: CT CHEST FINDINGS Cardiovascular: The heart is normal in size. No pericardial effusion. Stable tortuosity of the thoracic aorta but no focal aneurysm or dissection. No atherosclerotic calcifications. The branch vessels are normal. No definite coronary artery calcifications. Mediastinum/Nodes: No mediastinal or hilar mass or adenopathy. The esophagus is grossly normal. Lungs/Pleura: No worrisome pulmonary lesions or pulmonary nodules. No pleural effusion or pleural nodules. Musculoskeletal: Extensive artifact related to midthoracic fusion hardware. No worrisome bone lesions CT ABDOMEN PELVIS FINDINGS Hepatobiliary: No worrisome hepatic lesions. No obvious hepatic peritoneal surface disease. No intra or extrahepatic biliary dilatation. The gallbladder appears normal. Pancreas: No mass, inflammation or ductal dilatation. Spleen: Normal size. No focal lesions. Adrenals/Urinary Tract: The adrenal glands and kidneys are unremarkable. Small right renal calculus is unchanged. The bladder appears normal. Stomach/Bowel: The stomach, duodenum, small bowel and colon are grossly normal. No acute inflammatory changes, mass lesions or obstructive findings. Moderate stool throughout the colon and down into the rectum may suggest constipation. Vascular/Lymphatic: The aorta and branch vessels are patent. Scattered atherosclerotic calcifications. The major venous structures are patent. No mesenteric or retroperitoneal mass or adenopathy is identified. I do not see any obvious omental disease. No peritoneal surface lesions in  the abdomen are identified. Reproductive: Surgically absent. Other: Small amount of slightly high attenuation ascites in the right deep pelvis. No pelvic sidewall or inguinal adenopathy. Musculoskeletal: No significant bony findings. IMPRESSION: 1. Small amount of slightly high attenuation ascites in the right deep pelvis. No obvious omental disease or peritoneal surface lesions. 2. No abdominal/pelvic mass or adenopathy. 3. No findings for pulmonary metastatic disease. 4. Moderate stool throughout the colon and down into the rectum may suggest constipation. Electronically Signed   By: PMarijo SanesM.D.   On: 06/01/2019 12:59   CT ABDOMEN PELVIS W CONTRAST  Result Date: 06/01/2019 CLINICAL DATA:  Restaging ovarian cancer. Initial diagnosis June 2020. EXAM: CT CHEST, ABDOMEN, AND PELVIS WITH CONTRAST TECHNIQUE: Multidetector CT imaging of the chest, abdomen and pelvis was performed following the standard protocol during bolus administration of intravenous contrast. CONTRAST:  817mOMNIPAQUE IOHEXOL 300 MG/ML  SOLN COMPARISON:  CT scan 10/16/2018 FINDINGS: CT CHEST FINDINGS Cardiovascular: The heart is normal in size. No pericardial effusion. Stable tortuosity of the thoracic aorta but no focal aneurysm or dissection. No atherosclerotic calcifications. The branch vessels are normal. No definite coronary artery calcifications. Mediastinum/Nodes: No mediastinal or hilar mass or adenopathy. The esophagus is grossly normal. Lungs/Pleura: No worrisome pulmonary lesions or pulmonary nodules. No pleural effusion or pleural nodules. Musculoskeletal: Extensive artifact related to midthoracic fusion hardware. No worrisome bone lesions CT ABDOMEN PELVIS FINDINGS Hepatobiliary: No worrisome hepatic lesions. No obvious hepatic peritoneal surface disease. No intra or extrahepatic biliary dilatation. The gallbladder appears normal. Pancreas: No mass, inflammation or ductal dilatation. Spleen: Normal size. No focal lesions.  Adrenals/Urinary Tract: The adrenal glands and kidneys are unremarkable. Small right renal calculus is unchanged. The bladder appears normal. Stomach/Bowel: The stomach, duodenum, small bowel and colon are grossly  normal. No acute inflammatory changes, mass lesions or obstructive findings. Moderate stool throughout the colon and down into the rectum may suggest constipation. Vascular/Lymphatic: The aorta and branch vessels are patent. Scattered atherosclerotic calcifications. The major venous structures are patent. No mesenteric or retroperitoneal mass or adenopathy is identified. I do not see any obvious omental disease. No peritoneal surface lesions in the abdomen are identified. Reproductive: Surgically absent. Other: Small amount of slightly high attenuation ascites in the right deep pelvis. No pelvic sidewall or inguinal adenopathy. Musculoskeletal: No significant bony findings. IMPRESSION: 1. Small amount of slightly high attenuation ascites in the right deep pelvis. No obvious omental disease or peritoneal surface lesions. 2. No abdominal/pelvic mass or adenopathy. 3. No findings for pulmonary metastatic disease. 4. Moderate stool throughout the colon and down into the rectum may suggest constipation. Electronically Signed   By: Marijo Sanes M.D.   On: 06/01/2019 12:59     Assessment and plan- Patient is a 64 y.o. 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 Avastin was held for cycle 4. She is s/p robotic assisted total laparoscopic hysterectomy and bilateral salpingo-oophorectomy and appendectomy with extensive lysis of adhesions and abdomen debulking.She has completed 6 cycles of carbotaxol chemotherapy ( 4neoadjuvantly and2adjuvantly).Last carbotaxol chemotherapy given on 01/12/2019. She is here for on treatment assessment prior to cycle 8 of maintenance Mvasi  Patient  currently has tense distended abdomen from ascites reflective of recurrent disease. Her CA-125 has been trending up. Today her value is 110. Her lowest value was 9.9 in December 2020. Although her recent scans did not show overt peritoneal carcinomatosis of significant ascites her clinical picture is now consistent with progression. She did have small amount of high attenuation ascites in the right deep pelvis.  I will be scheduling her for an urgent ultrasound-guided paracentesis and send fluid for cytology. I will also obtain foundation 1 testing on her original path specimen. She is HRD negative and tested negative for BRCA on both somatic and germline testing.  Site of origin of high-grade serous carcinoma was tubo-ovarian not endometrial. She has had disease recurrence at 5-1/2 months after her last cycle of carbotaxol thereby making this a platinum resistant disease. Outside of a clinical trial her options would be Doxil and Avastin at this time. She will be seeing Dr. Theora Gianotti next week who will screen her for any additional clinical trials that would be available at [facility]. If she does not qualify for clinical trial or if it is not feasible for her to go to [facility], I will plan on getting baseline echocardiogram and potentially start Doxil and Avastin in 2 weeks time. Treatment will be given with a palliative intent  Back pain: Suspect secondary to ascites. I have given her prescription for oxycodone 5 mg every 6 as needed for pain  Follow-up with me to be determined based on her visit with Dr. Theora Gianotti next week   Visit Diagnosis 1. Serous adenocarcinoma ([place])   2. Malignant ascites   3. Neoplasm related pain   4. Goals of care, counseling/discussion      Dr. Randa Evens, MD, MPH [facility] [phone] 06/30/2019 8:32 AM

## 2019-07-01 LAB — CYTOLOGY - NON PAP

## 2019-07-04 ENCOUNTER — Inpatient Hospital Stay
Admission: EM | Admit: 2019-07-04 | Discharge: 2019-07-11 | DRG: 640 | Disposition: A | Discharge status: Hospice | Payer: Commercial Managed Care - PPO | Attending: Internal Medicine | Admitting: Internal Medicine

## 2019-07-04 ENCOUNTER — Other Ambulatory Visit: Payer: Self-pay

## 2019-07-04 DIAGNOSIS — C786 Secondary malignant neoplasm of retroperitoneum and peritoneum: Secondary | ICD-10-CM | POA: Diagnosis present

## 2019-07-04 DIAGNOSIS — R140 Abdominal distension (gaseous): Secondary | ICD-10-CM

## 2019-07-04 DIAGNOSIS — R54 Age-related physical debility: Secondary | ICD-10-CM | POA: Diagnosis present

## 2019-07-04 DIAGNOSIS — E119 Type 2 diabetes mellitus without complications: Secondary | ICD-10-CM

## 2019-07-04 DIAGNOSIS — J302 Other seasonal allergic rhinitis: Secondary | ICD-10-CM | POA: Diagnosis present

## 2019-07-04 DIAGNOSIS — R64 Cachexia: Secondary | ICD-10-CM | POA: Diagnosis present

## 2019-07-04 DIAGNOSIS — C569 Malignant neoplasm of unspecified ovary: Secondary | ICD-10-CM | POA: Diagnosis present

## 2019-07-04 DIAGNOSIS — R531 Weakness: Secondary | ICD-10-CM

## 2019-07-04 DIAGNOSIS — Z9079 Acquired absence of other genital organ(s): Secondary | ICD-10-CM

## 2019-07-04 DIAGNOSIS — D638 Anemia in other chronic diseases classified elsewhere: Secondary | ICD-10-CM | POA: Diagnosis present

## 2019-07-04 DIAGNOSIS — Z923 Personal history of irradiation: Secondary | ICD-10-CM

## 2019-07-04 DIAGNOSIS — R52 Pain, unspecified: Secondary | ICD-10-CM

## 2019-07-04 DIAGNOSIS — E785 Hyperlipidemia, unspecified: Secondary | ICD-10-CM | POA: Diagnosis present

## 2019-07-04 DIAGNOSIS — Z9221 Personal history of antineoplastic chemotherapy: Secondary | ICD-10-CM

## 2019-07-04 DIAGNOSIS — Z7982 Long term (current) use of aspirin: Secondary | ICD-10-CM

## 2019-07-04 DIAGNOSIS — E039 Hypothyroidism, unspecified: Secondary | ICD-10-CM | POA: Diagnosis present

## 2019-07-04 DIAGNOSIS — E1165 Type 2 diabetes mellitus with hyperglycemia: Secondary | ICD-10-CM | POA: Diagnosis present

## 2019-07-04 DIAGNOSIS — Z79899 Other long term (current) drug therapy: Secondary | ICD-10-CM

## 2019-07-04 DIAGNOSIS — E860 Dehydration: Secondary | ICD-10-CM | POA: Diagnosis present

## 2019-07-04 DIAGNOSIS — Z79890 Hormone replacement therapy (postmenopausal): Secondary | ICD-10-CM

## 2019-07-04 DIAGNOSIS — Z880 Allergy status to penicillin: Secondary | ICD-10-CM

## 2019-07-04 DIAGNOSIS — Z801 Family history of malignant neoplasm of trachea, bronchus and lung: Secondary | ICD-10-CM

## 2019-07-04 DIAGNOSIS — Z881 Allergy status to other antibiotic agents status: Secondary | ICD-10-CM

## 2019-07-04 DIAGNOSIS — R1030 Lower abdominal pain, unspecified: Secondary | ICD-10-CM | POA: Diagnosis not present

## 2019-07-04 DIAGNOSIS — E876 Hypokalemia: Secondary | ICD-10-CM | POA: Diagnosis present

## 2019-07-04 DIAGNOSIS — R627 Adult failure to thrive: Principal | ICD-10-CM | POA: Diagnosis present

## 2019-07-04 DIAGNOSIS — Z20822 Contact with and (suspected) exposure to covid-19: Secondary | ICD-10-CM | POA: Diagnosis present

## 2019-07-04 DIAGNOSIS — Z6820 Body mass index (BMI) 20.0-20.9, adult: Secondary | ICD-10-CM

## 2019-07-04 DIAGNOSIS — Z90722 Acquired absence of ovaries, bilateral: Secondary | ICD-10-CM

## 2019-07-04 DIAGNOSIS — Z515 Encounter for palliative care: Secondary | ICD-10-CM

## 2019-07-04 DIAGNOSIS — Z66 Do not resuscitate: Secondary | ICD-10-CM | POA: Diagnosis present

## 2019-07-04 DIAGNOSIS — R188 Other ascites: Secondary | ICD-10-CM

## 2019-07-04 DIAGNOSIS — Z0189 Encounter for other specified special examinations: Secondary | ICD-10-CM

## 2019-07-04 DIAGNOSIS — F05 Delirium due to known physiological condition: Secondary | ICD-10-CM | POA: Diagnosis not present

## 2019-07-04 DIAGNOSIS — R109 Unspecified abdominal pain: Secondary | ICD-10-CM

## 2019-07-04 DIAGNOSIS — K668 Other specified disorders of peritoneum: Secondary | ICD-10-CM

## 2019-07-04 DIAGNOSIS — Z794 Long term (current) use of insulin: Secondary | ICD-10-CM

## 2019-07-04 DIAGNOSIS — N179 Acute kidney failure, unspecified: Secondary | ICD-10-CM

## 2019-07-04 DIAGNOSIS — K529 Noninfective gastroenteritis and colitis, unspecified: Secondary | ICD-10-CM | POA: Diagnosis present

## 2019-07-04 DIAGNOSIS — N390 Urinary tract infection, site not specified: Secondary | ICD-10-CM | POA: Diagnosis present

## 2019-07-04 DIAGNOSIS — Z4659 Encounter for fitting and adjustment of other gastrointestinal appliance and device: Secondary | ICD-10-CM

## 2019-07-04 DIAGNOSIS — K56609 Unspecified intestinal obstruction, unspecified as to partial versus complete obstruction: Secondary | ICD-10-CM

## 2019-07-04 DIAGNOSIS — K631 Perforation of intestine (nontraumatic): Secondary | ICD-10-CM | POA: Diagnosis present

## 2019-07-04 DIAGNOSIS — C563 Malignant neoplasm of bilateral ovaries: Secondary | ICD-10-CM | POA: Diagnosis present

## 2019-07-04 DIAGNOSIS — Z90710 Acquired absence of both cervix and uterus: Secondary | ICD-10-CM

## 2019-07-04 DIAGNOSIS — K219 Gastro-esophageal reflux disease without esophagitis: Secondary | ICD-10-CM | POA: Diagnosis present

## 2019-07-04 LAB — CBC WITH DIFFERENTIAL/PLATELET
Abs Immature Granulocytes: 0.11 10*3/uL — ABNORMAL HIGH (ref 0.00–0.07)
Basophils Absolute: 0 10*3/uL (ref 0.0–0.1)
Basophils Relative: 0 %
Eosinophils Absolute: 0 10*3/uL (ref 0.0–0.5)
Eosinophils Relative: 0 %
HCT: 28.1 % — ABNORMAL LOW (ref 36.0–46.0)
Hemoglobin: 9.8 g/dL — ABNORMAL LOW (ref 12.0–15.0)
Immature Granulocytes: 1 %
Lymphocytes Relative: 6 %
Lymphs Abs: 0.6 10*3/uL — ABNORMAL LOW (ref 0.7–4.0)
MCH: 33.8 pg (ref 26.0–34.0)
MCHC: 34.9 g/dL (ref 30.0–36.0)
MCV: 96.9 fL (ref 80.0–100.0)
Monocytes Absolute: 0.4 10*3/uL (ref 0.1–1.0)
Monocytes Relative: 3 %
Neutro Abs: 9.8 10*3/uL — ABNORMAL HIGH (ref 1.7–7.7)
Neutrophils Relative %: 90 %
Platelets: 169 10*3/uL (ref 150–400)
RBC: 2.9 MIL/uL — ABNORMAL LOW (ref 3.87–5.11)
RDW: 13.2 % (ref 11.5–15.5)
Smear Review: NORMAL
WBC: 10.8 10*3/uL — ABNORMAL HIGH (ref 4.0–10.5)
nRBC: 0 % (ref 0.0–0.2)

## 2019-07-04 LAB — COMPREHENSIVE METABOLIC PANEL
ALT: 19 U/L (ref 0–44)
AST: 25 U/L (ref 15–41)
Albumin: 2 g/dL — ABNORMAL LOW (ref 3.5–5.0)
Alkaline Phosphatase: 88 U/L (ref 38–126)
Anion gap: 16 — ABNORMAL HIGH (ref 5–15)
BUN: 75 mg/dL — ABNORMAL HIGH (ref 8–23)
CO2: 20 mmol/L — ABNORMAL LOW (ref 22–32)
Calcium: 8 mg/dL — ABNORMAL LOW (ref 8.9–10.3)
Chloride: 97 mmol/L — ABNORMAL LOW (ref 98–111)
Creatinine, Ser: 1.67 mg/dL — ABNORMAL HIGH (ref 0.44–1.00)
GFR calc Af Amer: 37 mL/min — ABNORMAL LOW (ref >60)
GFR calc non Af Amer: 32 mL/min — ABNORMAL LOW (ref >60)
Glucose, Bld: 98 mg/dL (ref 70–99)
Potassium: 3.3 mmol/L — ABNORMAL LOW (ref 3.5–5.1)
Sodium: 133 mmol/L — ABNORMAL LOW (ref 135–145)
Total Bilirubin: 1 mg/dL (ref 0.3–1.2)
Total Protein: 5.5 g/dL — ABNORMAL LOW (ref 6.5–8.1)

## 2019-07-04 LAB — TROPONIN I (HIGH SENSITIVITY)
Troponin I (High Sensitivity): 12 ng/L (ref <18)
Troponin I (High Sensitivity): 10 ng/L (ref <18)

## 2019-07-04 LAB — SARS CORONAVIRUS 2 BY RT PCR (HOSPITAL ORDER, PERFORMED IN ~~LOC~~ HOSPITAL LAB): SARS Coronavirus 2: NEGATIVE

## 2019-07-04 LAB — GLUCOSE, CAPILLARY: Glucose-Capillary: 93 mg/dL (ref 70–99)

## 2019-07-04 MED ORDER — SODIUM CHLORIDE 0.9 % IV SOLN: Freq: Once | INTRAVENOUS | Status: DC

## 2019-07-04 MED ORDER — SODIUM CHLORIDE 0.9 % IV SOLN
1.0000 g | INTRAVENOUS | Status: DC
  Administered 2019-07-04: 1 g via INTRAVENOUS
  Filled 2019-07-04 (×2): qty 10

## 2019-07-04 MED ORDER — INSULIN ASPART 100 UNIT/ML ~~LOC~~ SOLN: 0.0000 [IU] | Freq: Three times a day (TID) | SUBCUTANEOUS | Status: DC

## 2019-07-04 MED ORDER — ENOXAPARIN SODIUM 40 MG/0.4ML ~~LOC~~ SOLN
30.0000 mg | SUBCUTANEOUS | Status: DC
  Administered 2019-07-04: 30 mg via SUBCUTANEOUS
  Filled 2019-07-04: qty 0.4

## 2019-07-04 MED ORDER — SODIUM CHLORIDE 0.9 % IV SOLN: INTRAVENOUS | Status: DC

## 2019-07-04 MED ORDER — SIMVASTATIN 20 MG PO TABS
20.0000 mg | ORAL_TABLET | Freq: Every day | ORAL | Status: DC
  Administered 2019-07-05: 20 mg via ORAL
  Filled 2019-07-04: qty 2

## 2019-07-04 MED ORDER — LIDOCAINE HCL (PF) 1 % IJ SOLN
5.0000 mL | Freq: Once | INTRAMUSCULAR | Status: AC
  Administered 2019-07-05: 5 mL
  Filled 2019-07-04: qty 5

## 2019-07-04 MED ORDER — ASPIRIN EC 81 MG PO TBEC
81.0000 mg | DELAYED_RELEASE_TABLET | Freq: Every day | ORAL | Status: DC
  Administered 2019-07-05: 81 mg via ORAL
  Filled 2019-07-04: qty 1

## 2019-07-04 MED ORDER — SODIUM CHLORIDE 0.9 % IV BOLUS
1000.0000 mL | Freq: Once | INTRAVENOUS | Status: AC
  Administered 2019-07-04: 1000 mL via INTRAVENOUS

## 2019-07-04 MED ORDER — ENOXAPARIN SODIUM 40 MG/0.4ML ~~LOC~~ SOLN: 40.0000 mg | SUBCUTANEOUS | Status: DC

## 2019-07-04 MED ORDER — OXYCODONE HCL 5 MG PO TABS: 5.0000 mg | ORAL_TABLET | Freq: Four times a day (QID) | ORAL | Status: DC | PRN

## 2019-07-04 MED ORDER — LEVOTHYROXINE SODIUM 100 MCG PO TABS
100.0000 ug | ORAL_TABLET | Freq: Every day | ORAL | Status: DC
  Administered 2019-07-05: 100 ug via ORAL
  Filled 2019-07-04: qty 2

## 2019-07-04 NOTE — H&P (Signed)
History and Physical    Meredith Jones [id number] DOB: October 16, 1955 DOA: 07/04/2019  PCP: Mar Daring, PA-C  Patient coming from: Home, has female friend at bedside  I have personally briefly reviewed patient's old medical records in [place]  Chief Complaint: Progressive weakness  HPI: Meredith Jones is a 64 y.o. 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   For the past 3 to 4 days patient has felt more fatigued, lethargic.  Has been laying in bed more often.  Has had decreased P.o. intake.  Also notes new lower abdominal pain that is constant and seems to be worse with food.  Pain is currently 4 out of 10 and feels like his dull, sharp and cramping.  Denies any nausea or vomiting.  Denies dysuria but has been feeling increased urgency.  She has chronic diarrhea on and off for some time.  She has also been dealing with constipation and recently put on stool softener by her physician and has had a bowel movement sometime this week.  He feels like her constipation is improved.  She denies any fever.  Has occasional runny nose but new cough, sore throat or shortness of breath.  She had recent paracentesis for ascites on 5/18 and had a liter of fluid removed and does not feel like her abdomen has had increased swelling.   In the ED, she was afebrile, mildly tachycardic on room air.  Mild leukocytosis of 10.8, anemia of 9.8, Na of 133, K of 3.3, creatinine elevated at 1.67 from 1.17 five days ago. Mild anion gap of 16.  Review of Systems: Constitutional: No Weight Change, No Fever ENT/Mouth: No sore throat, + Rhinorrhea Eyes: No Eye Pain, No Vision Changes Cardiovascular: No Chest Pain, no SOB Respiratory: No Cough, No Sputum, No  Wheezing, no Dyspnea  Gastrointestinal: No Nausea, No Vomiting, + Diarrhea, + Constipation,+ Pain Genitourinary: no Urinary Incontinence, + Urgency, No Flank Pain Musculoskeletal: No Arthralgias, No Myalgias Skin: No Skin Lesions, No Pruritus, Neuro: no Weakness, No Numbness Psych: No Anxiety/Panic, No Depression, + decrease appetite Heme/Lymph: No Bruising, No Bleeding  Past Medical History:  Diagnosis Date  . Allergy   . Anemia   . Complication of anesthesia   . Diabetes mellitus without complication ([place])   . Diverticulosis   . Family history of adverse reaction to anesthesia    mom-n/v  . Family history of lung cancer   . GERD (gastroesophageal reflux disease)    h/o  . Hyperlipidemia   . Hypothyroidism   . Ovarian cancer ([place]) 08/04/2018   Chemo tx's and Hysterectomy.  . Personal history of chemotherapy   . PONV (postoperative nausea and vomiting)    sick with tonsillectomy  . 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 Thyroid disease     Past Surgical History:  Procedure Laterality Date  . ABDOMINAL HYSTERECTOMY    . BACK SURGERY  2007   Lumbar Surgery  . BREAST CYST ASPIRATION Right    Around 10 years ago. Pt thinks it was the right breast but not sure.  Marland Kitchen CATARACT EXTRACTION Left 2010   Right Eye 2011  . COLONOSCOPY WITH PROPOFOL N/A 09/02/2015   Procedure: COLONOSCOPY WITH PROPOFOL;  Surgeon: [name], MD;  Location: [place];  Service: Endoscopy;  Laterality: N/A;  Diabetic - oral meds  . ESOPHAGOGASTRODUODENOSCOPY (EGD) WITH PROPOFOL N/A 11/03/2015   Procedure: ESOPHAGOGASTRODUODENOSCOPY (EGD) WITH PROPOFOL;  Surgeon: [name], MD;  Location: [facility];  Service: Endoscopy;  Laterality: N/A;  . EYE SURGERY Bilateral 06/2014    tear duck catherization ( Dr. Vallarie Mare)  . LAPAROSCOPIC APPENDECTOMY  11/26/2018   Procedure: APPENDECTOMY LAPAROSCOPIC;  Surgeon: Ward, Honor Loh, MD;  Location: [facility];  Service: Gynecology;;  .  POLYPECTOMY  09/02/2015   Procedure: POLYPECTOMY;  Surgeon: [name], MD;  Location: [place];  Service: Endoscopy;;  . TONSILLECTOMY  1963     reports that she has never smoked. She has never used smokeless tobacco. She reports that she does not drink alcohol or use drugs.  Allergies  Allergen Reactions  . Clarithromycin Nausea Only  . Doxycycline Nausea Only    dizziness  . Penicillins Hives    Did it involve swelling of the face/tongue/throat, SOB, or low BP? No Did it involve sudden or severe rash/hives, skin peeling, or any reaction on the inside of your mouth or nose? No Did you need to seek medical attention at a hospital or doctor's office? No When did it last happen?50+ years ago If all above answers are "NO", may proceed with cephalosporin use.     Family History  Problem Relation Age of Onset  . Congestive Heart Failure Mother   . Diabetes Mother   . Stroke Mother   . Hypothyroidism Father   . Cancer Father        lung cancer  . Diabetes Father   . Cancer Paternal Grandmother        stomach  . Breast cancer Neg Hx      Prior to Admission medications   Medication Sig Start Date End Date Taking? Authorizing Provider  acetaminophen (TYLENOL) 500 MG tablet Take 2 tablets (1,000 mg total) by mouth every 6 (six) hours. 11/27/18   Ward, Honor Loh, MD  aspirin 81 MG tablet Take 81 mg by mouth daily.  03/12/07   [provider]  calcipotriene (DOVONOX) 0.005 % ointment Apply 1 application topically as needed. CALCIPOTRIENE, 0.005% (External Ointment) - Historical Medication  apply to skin daily (0.005 %) Active    [provider]  dexamethasone (DECADRON) 4 MG tablet Take 2 tablets (8 mg total) by mouth daily. Start the day after carboplatin chemotherapy for 3 days. 02/16/19   Borders, Kirt Boys, NP  fexofenadine (ALLEGRA) 60 MG tablet Take 60 mg by mouth daily as needed for allergies.     [provider]  furosemide (LASIX) 20 MG  tablet Take 1 tablet (20 mg total) by mouth daily. 07/23/18 07/23/19  Laban Emperor, PA-C  gabapentin (NEURONTIN) 300 MG capsule Take 1 capsule (300 mg total) by mouth at bedtime. Take 2 tablets at bedtime once and then 3 tablets for three nights at bedtime. 12/23/18 12/23/19  Sindy Guadeloupe, MD  glipiZIDE (GLUCOTROL) 5 MG tablet TAKE 1 TABLET BY MOUTH DAILY BEFORE BREAKFAST Patient taking differently: Take 5 mg by mouth daily before breakfast.  05/09/17   Mar Daring, PA-C  glucose blood (ONE TOUCH ULTRA TEST) test strip USE TO TEST BLOOD SUGAR TWICE A DAY AS INSTRUCTED 07/23/17   Mar Daring, PA-C  ibuprofen (ADVIL) 600 MG tablet Take 1 tablet (600 mg total) by mouth every 6 (six) hours. 11/26/18   Ward, Honor Loh, MD  insulin degludec (TRESIBA FLEXTOUCH) 100 UNIT/ML SOPN FlexTouch Pen Inject 15  Units into the skin daily. 02/10/19   [provider]  levothyroxine (SYNTHROID) 100 MCG tablet TAKE 1 TABLET BY MOUTH DAILY 06/02/19   Mar Daring, PA-C  LORazepam (ATIVAN) 0.5 MG tablet Take 1 tablet (0.5 mg total) by mouth every 6 (six) hours as needed (Nausea or vomiting). Patient not taking: Reported on 06/08/2019 08/17/18   Sindy Guadeloupe, MD  metFORMIN (GLUCOPHAGE) 500 MG tablet Take 2 tablets (1,000 mg total) by mouth 2 (two) times daily. 01/19/19 06/29/19  Mar Daring, PA-C  MULTIPLE VITAMIN PO Take 1 tablet by mouth daily.  03/12/07   [provider]  ondansetron (ZOFRAN) 8 MG tablet Take 1 tablet (8 mg total) by mouth 2 (two) times daily as needed for refractory nausea / vomiting. Start on day 3 after carboplatin chemo. Patient not taking: Reported on 05/18/2019 08/17/18   Sindy Guadeloupe, MD  [facility] DELICA LANCETS [id number] MISC USE TO TEST BLOOD SUGAR ONCE A DAY 11/28/15   Mar Daring, PA-C  oxyCODONE (ROXICODONE) 5 MG immediate release tablet Take 1 tablet (5 mg total) by mouth every 6 (six) hours as needed for moderate pain. 06/29/19 06/28/20  Sindy Guadeloupe, MD  prochlorperazine (COMPAZINE) 10 MG tablet Take 1 tablet (10 mg total) by mouth every 6 (six) hours as needed (Nausea or vomiting). Patient not taking: Reported on 06/08/2019 08/17/18   Sindy Guadeloupe, MD  simvastatin (ZOCOR) 20 MG tablet Take 1 tablet (20 mg total) by mouth at bedtime. Please schedule office visit before any future refills 03/03/19   Mar Daring, PA-C  spironolactone (ALDACTONE) 50 MG tablet Take 1 tablet (50 mg total) by mouth daily. Patient not taking: Reported on 06/29/2019 07/23/18   Laban Emperor, PA-C  XIIDRA 5 % SOLN  04/14/19   [provider]    Physical Exam: Vitals:   07/04/19 1645 07/04/19 1700 07/04/19 1715 07/04/19 1730  BP:  105/68  102/64  Pulse: 98 97 98 94  Resp: 19 20 19  (!) 21  Temp:      TempSrc:      SpO2: 98% 100% 99% 93%  Weight:      Height:        Constitutional: NAD, calm, comfortable, nontoxic frail thin elderly female laying flat in bed Vitals:   07/04/19 1645 07/04/19 1700 07/04/19 1715 07/04/19 1730  BP:  105/68  102/64  Pulse: 98 97 98 94  Resp: 19 20 19  (!) 21  Temp:      TempSrc:      SpO2: 98% 100% 99% 93%  Weight:      Height:       Eyes: PERRL, lids and conjunctivae normal ENMT: Mucous membranes are moist. Neck: normal, supple Respiratory: clear to auscultation bilaterally, no wheezing, no crackles. Normal respiratory effort on room air. No accessory muscle use.  Cardiovascular: Regular rate and rhythm, no murmurs / rubs / gallops. No extremity edema. 2+ pedal pulses.  Abdomen: no tenderness with no guarding, rigidity or rebound tenderness, no masses palpated.  Bowel sounds positive.  Small paracentesis needle wound to right lower abdomen without erythema or discharge. Musculoskeletal: no clubbing / cyanosis. No joint deformity upper and lower extremities. Good ROM, no contractures. Normal muscle tone.  Skin: Scattered bruises on bilateral upper and lower extremity likely from trauma. Neurologic:  CN 2-12 grossly intact. Sensation intact,  Strength 5/5 in all 4.  Psychiatric: Normal judgment and insight. Alert and oriented x 3. Normal mood.  Labs on Admission: I have personally reviewed following labs and imaging studies  CBC: Recent Labs  Lab 06/29/19 0836 07/04/19 1631  WBC 7.7 10.8*  NEUTROABS 6.2 9.8*  HGB 11.7* 9.8*  HCT 33.9* 28.1*  MCV 99.4 96.9  PLT 304 [id number]   Basic Metabolic Panel: Recent Labs  Lab 06/29/19 0836 07/04/19 1631  NA 132* 133*  K 3.7 3.3*  CL 95* 97*  CO2 23 20*  GLUCOSE 188* 98  BUN 55* 75*  CREATININE 1.17* 1.67*  CALCIUM 8.9 8.0*   GFR: Estimated Creatinine Clearance: 29.1 mL/min (A) (by C-G formula based on SCr of 1.67 mg/dL (H)). Liver Function Tests: Recent Labs  Lab 06/29/19 0836 07/04/19 1631  AST 30 25  ALT 23 19  ALKPHOS 73 88  BILITOT 0.5 1.0  PROT 6.3* 5.5*  ALBUMIN 2.5* 2.0*   No results for input(s): LIPASE, AMYLASE in the last 168 hours. No results for input(s): AMMONIA in the last 168 hours. Coagulation Profile: No results for input(s): INR, PROTIME in the last 168 hours. Cardiac Enzymes: No results for input(s): CKTOTAL, CKMB, CKMBINDEX, TROPONINI in the last 168 hours. BNP (last 3 results) No results for input(s): PROBNP in the last 8760 hours. HbA1C: No results for input(s): HGBA1C in the last 72 hours. CBG: No results for input(s): GLUCAP in the last 168 hours. Lipid Profile: No results for input(s): CHOL, HDL, LDLCALC, TRIG, CHOLHDL, LDLDIRECT in the last 72 hours. Thyroid Function Tests: No results for input(s): TSH, T4TOTAL, FREET4, T3FREE, THYROIDAB in the last 72 hours. Anemia Panel: No results for input(s): VITAMINB12, FOLATE, FERRITIN, TIBC, IRON, RETICCTPCT in the last 72 hours. Urine analysis:    Component Value Date/Time   BILIRUBINUR Negative 07/27/2014 1439   PROTEINUR negative 07/27/2014 1439   UROBILINOGEN 0.2 07/27/2014 1439   NITRITE negative 07/27/2014 1439   LEUKOCYTESUR  Negative 07/27/2014 1439    Radiological Exams on Admission: No results found.    Assessment/Plan  Weakness with history of high-grade serous adenocarcinoma on chemotherapy Likely multifactorial from dehydration and possible UTI Will start continuous IV normal saline 75 cc fluid PT/OT  AKI Continuous fluid as above Avoid nephrotoxic agent  Abdominal pain Bedside ultrasound performed by ED physician Dr. Archie Balboa showed no significant ascites needing repeat paracentesis Abdominal exam overall benign-could be due to her chronic constipation versus potential UTI UA still pending but she does endorse increased frequency.  Will give Rocephin and can de-escalate pending urine results  Insulin-dependent type 2 diabetes Sensitive sliding scale for now  Hypothyroidism Continue levothyroxine  Anemia of chronic disease Stable  DVT prophylaxis:.Lovenox Code Status: Full Family Communication: Plan discussed with patient at bedside.  All questions and concerns answered. disposition Plan: Home with at least 2 midnight stays  Consults called:  Admission status: inpatient  Status is: Inpatient  Remains inpatient appropriate because:Inpatient level of care appropriate due to severity of illness   Dispo: The patient is from: Home              Anticipated d/c is to: Home              Anticipated d/c date is: 2 days              Patient currently is not medically stable to d/c.          Orene Desanctis DO [facility]   If 7PM-7AM, please contact night-coverage [email or web address]   07/04/2019, 8:03 PM

## 2019-07-04 NOTE — ED Triage Notes (Signed)
Pt arrives via ems from home, pt is currently being treated at the cancer center for her ovarian ca, pt had her abd tapped last Monday for ascites. Pt states that her abd is becoming swollen again. Pt states she has a good appetite she just cont to get weaker and weaker  Ems placed an 18g iv left ac  cbg for ems [id number]  Systolic bp of [id number]

## 2019-07-04 NOTE — ED Notes (Signed)
Patient from home via EMS due to abdominal cramping, distention, and weakness. Patient is being treated at the cancer center for ovarian cancer.

## 2019-07-04 NOTE — ED Notes (Signed)
Pt is able to get up and walk to bathroom with 1 assist.

## 2019-07-04 NOTE — ED Notes (Signed)
Pt presents as a cancer pt with swelling to the abdomen and pain. Pt is resting in room with visitor.

## 2019-07-04 NOTE — Progress Notes (Signed)
PHARMACIST - PHYSICIAN COMMUNICATION  CONCERNING:  Enoxaparin (Lovenox) for DVT Prophylaxis    RECOMMENDATION: Patient was prescribed enoxaprin 40mg  q24 hours for VTE prophylaxis.   Filed Weights   07/04/19 1628  Weight: 54.2 kg (119 lb 8 oz)    Body mass index is 20.51 kg/m.  Estimated Creatinine Clearance: 29.1 mL/min (A) (by C-G formula based on SCr of 1.67 mg/dL (H)).  Patient is candidate for enoxaparin 30mg  every 24 hours based on CrCl <48ml/min   DESCRIPTION: Pharmacy has adjusted enoxaparin dose per [facility] policy.  Patient is now receiving enoxaparin 30mg  every 24 hours.  Lu Duffel, PharmD, BCPS Clinical Pharmacist 07/04/2019 8:33 PM

## 2019-07-04 NOTE — ED Provider Notes (Signed)
[facility] Emergency Department Provider Note  ____________________________________________   I have reviewed the triage vital signs and the nursing notes.   HISTORY  Chief Complaint Weakness   History limited by: Not Limited   HPI Meredith Jones is a 64 y.o. female who presents to the emergency department today because of concerns for weakness and some abdominal cramping.  The patient states that the symptoms really started this afternoon.  She felt slightly weak in the morning however not to a level that was concerned.  She states this afternoon however she continued to become more weak.  She additionally has had some abdominal cramping.  She states she has had similar symptoms in the past.  States they have been somewhat intermittent.  She felt like the abdominal cramping today was slightly worse than it has been in the past.  The patient denies any associated nausea or vomiting.  She denies any fevers.  She states she has had increased frequency of both stooling and urination.  She denies any burning with urination.  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   Records reviewed. Per medical record review patient has a history of recent paracentesis.   Past Medical History:  Diagnosis Date  . Allergy   . Anemia   . Complication of anesthesia   . Diabetes mellitus without complication ([place])   . Diverticulosis   . Family history of adverse reaction to anesthesia    mom-n/v  . Family history of lung cancer   . GERD (gastroesophageal reflux disease)    h/o  . Hyperlipidemia   . Hypothyroidism   . Ovarian cancer ([place]) 08/04/2018   Chemo tx's and Hysterectomy.  . Personal history of chemotherapy   . PONV (postoperative nausea and vomiting)    sick with tonsillectomy  . 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 Thyroid disease     Patient Active Problem List   Diagnosis Date Noted  . Bilateral primary  ovarian cancer ([place]) 02/02/2019  . Genetic testing 12/09/2018  . Status post hysterectomy 11/26/2018  . Need for prophylactic vaccination and inoculation against influenza 11/04/2018  . Anemia of chronic disease 10/22/2018  . B12 deficiency 10/22/2018  . Family history of lung cancer   . 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 Goals of care, counseling/discussion 08/11/2018  . 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 Loss of weight   . Benign neoplasm of descending colon   . Type 2 diabetes mellitus without complication, without long-term current use of insulin ([place]) 06/07/2015  . 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 Cyst of ovary 06/09/2014  . Bulky or enlarged uterus 06/09/2014  . Hypercholesteremia 02/09/2007  . Allergic rhinitis 11/27/2006  . Endometriosis 11/27/2006  . Adult hypothyroidism 11/27/2006    Past Surgical History:  Procedure Laterality Date  . ABDOMINAL HYSTERECTOMY    . BACK SURGERY  2007   Lumbar Surgery  . BREAST CYST ASPIRATION Right    Around 10 years ago. Pt thinks it was the right breast but not sure.  Marland Kitchen CATARACT EXTRACTION Left 2010   Right Eye 2011  . COLONOSCOPY WITH PROPOFOL N/A 09/02/2015   Procedure: COLONOSCOPY WITH PROPOFOL;  Surgeon: [name], MD;  Location: [place];  Service: Endoscopy;  Laterality: N/A;  Diabetic - oral meds  . ESOPHAGOGASTRODUODENOSCOPY (EGD) WITH PROPOFOL N/A 11/03/2015   Procedure: ESOPHAGOGASTRODUODENOSCOPY (EGD) WITH PROPOFOL;  Surgeon: [name], MD;  Location: [place];  Service: Endoscopy;  Laterality: N/A;  . EYE SURGERY Bilateral 06/2014    tear duck catherization ( Dr. Vallarie Mare)  . LAPAROSCOPIC APPENDECTOMY  11/26/2018   Procedure: APPENDECTOMY LAPAROSCOPIC;  Surgeon: Ward, Honor Loh, MD;  Location: [facility];  Service: Gynecology;;  . POLYPECTOMY  09/02/2015   Procedure: POLYPECTOMY;  Surgeon: [name], MD;  Location: [place];  Service:  Endoscopy;;  . TONSILLECTOMY  1963    Prior to Admission medications   Medication Sig Start Date End Date Taking? Authorizing Provider  acetaminophen (TYLENOL) 500 MG tablet Take 2 tablets (1,000 mg total) by mouth every 6 (six) hours. 11/27/18   Ward, Honor Loh, MD  aspirin 81 MG tablet Take 81 mg by mouth daily.  03/12/07   [provider]  calcipotriene (DOVONOX) 0.005 % ointment Apply 1 application topically as needed. CALCIPOTRIENE, 0.005% (External Ointment) - Historical Medication  apply to skin daily (0.005 %) Active    [provider]  dexamethasone (DECADRON) 4 MG tablet Take 2 tablets (8 mg total) by mouth daily. Start the day after carboplatin chemotherapy for 3 days. 02/16/19   Borders, Kirt Boys, NP  fexofenadine (ALLEGRA) 60 MG tablet Take 60 mg by mouth daily as needed for allergies.     [provider]  furosemide (LASIX) 20 MG tablet Take 1 tablet (20 mg total) by mouth daily. 07/23/18 07/23/19  Laban Emperor, PA-C  gabapentin (NEURONTIN) 300 MG capsule Take 1 capsule (300 mg total) by mouth at bedtime. Take 2 tablets at bedtime once and then 3 tablets for three nights at bedtime. 12/23/18 12/23/19  Sindy Guadeloupe, MD  glipiZIDE (GLUCOTROL) 5 MG tablet TAKE 1 TABLET BY MOUTH DAILY BEFORE BREAKFAST Patient taking differently: Take 5 mg by mouth daily before breakfast.  05/09/17   Mar Daring, PA-C  glucose blood (ONE TOUCH ULTRA TEST) test strip USE TO TEST BLOOD SUGAR TWICE A DAY AS INSTRUCTED 07/23/17   Mar Daring, PA-C  ibuprofen (ADVIL) 600 MG tablet Take 1 tablet (600 mg total) by mouth every 6 (six) hours. 11/26/18   Ward, Honor Loh, MD  insulin degludec (TRESIBA FLEXTOUCH) 100 UNIT/ML SOPN FlexTouch Pen Inject 15 Units into the skin daily. 02/10/19   [provider]  levothyroxine (SYNTHROID) 100 MCG tablet TAKE 1 TABLET BY MOUTH DAILY 06/02/19   Mar Daring, PA-C  LORazepam (ATIVAN) 0.5 MG tablet Take 1 tablet (0.5 mg  total) by mouth every 6 (six) hours as needed (Nausea or vomiting). Patient not taking: Reported on 06/08/2019 08/17/18   Sindy Guadeloupe, MD  metFORMIN (GLUCOPHAGE) 500 MG tablet Take 2 tablets (1,000 mg total) by mouth 2 (two) times daily. 01/19/19 06/29/19  Mar Daring, PA-C  MULTIPLE VITAMIN PO Take 1 tablet by mouth daily.  03/12/07   [provider]  ondansetron (ZOFRAN) 8 MG tablet Take 1 tablet (8 mg total) by mouth 2 (two) times daily as needed for refractory nausea / vomiting. Start on day 3 after carboplatin chemo. Patient not taking: Reported on 05/18/2019 08/17/18   Sindy Guadeloupe, MD  [facility] DELICA LANCETS [id number] MISC USE TO TEST BLOOD SUGAR ONCE A DAY 11/28/15   Mar Daring, PA-C  oxyCODONE (ROXICODONE) 5 MG immediate release tablet Take 1 tablet (5 mg total) by mouth every 6 (six) hours as needed for moderate pain. 06/29/19 06/28/20  Sindy Guadeloupe, MD  prochlorperazine (COMPAZINE) 10 MG tablet Take 1 tablet (10 mg total) by mouth every 6 (six) hours  as needed (Nausea or vomiting). Patient not taking: Reported on 06/08/2019 08/17/18   Sindy Guadeloupe, MD  simvastatin (ZOCOR) 20 MG tablet Take 1 tablet (20 mg total) by mouth at bedtime. Please schedule office visit before any future refills 03/03/19   Mar Daring, PA-C  spironolactone (ALDACTONE) 50 MG tablet Take 1 tablet (50 mg total) by mouth daily. Patient not taking: Reported on 06/29/2019 07/23/18   Laban Emperor, PA-C  XIIDRA 5 % SOLN  04/14/19   [provider]    Allergies Clarithromycin, Doxycycline, and Penicillins  Family History  Problem Relation Age of Onset  . Congestive Heart Failure Mother   . Diabetes Mother   . Stroke Mother   . Hypothyroidism Father   . Cancer Father        lung cancer  . Diabetes Father   . Cancer Paternal Grandmother        stomach  . Breast cancer Neg Hx     Social History Social History   Tobacco Use  . Smoking status: Never Smoker  . Smokeless  tobacco: Never Used  Substance Use Topics  . Alcohol use: No  . Drug use: No    Review of Systems Constitutional: No fever/chills. Positive for generalized weakness. Eyes: No visual changes. ENT: No sore throat. Cardiovascular: Denies chest pain. Respiratory: Denies shortness of breath. Gastrointestinal: Positive for abdominal pain. Genitourinary: Negative for dysuria. Musculoskeletal: Negative for back pain. Skin: Negative for rash. Neurological: Negative for headaches, focal weakness or numbness.  ____________________________________________   PHYSICAL EXAM:  VITAL SIGNS: ED Triage Vitals  Enc Vitals Group     BP 07/04/19 1627 98/67     Pulse Rate 07/04/19 1627 (!) 101     Resp 07/04/19 1627 18     Temp 07/04/19 1627 97.7 F (36.5 C)     Temp Source 07/04/19 1627 Oral     SpO2 07/04/19 1627 96 %     Weight 07/04/19 1628 119 lb 8 oz (54.2 kg)     Height 07/04/19 1628 5\' 4"  (1.626 m)     Head Circumference --      Peak Flow --      Pain Score 07/04/19 1627 7     Pain Loc --      Pain Edu? --      Excl. in [place]? --      Constitutional: Alert and oriented.  Eyes: Conjunctivae are normal.  ENT      Head: Normocephalic and atraumatic.      Nose: No congestion/rhinnorhea.      Mouth/Throat: Mucous membranes are moist.      Neck: No stridor. Hematological/Lymphatic/Immunilogical: No cervical lymphadenopathy. Cardiovascular: Normal rate, regular rhythm.  No murmurs, rubs, or gallops.  Respiratory: Normal respiratory effort without tachypnea nor retractions. Breath sounds are clear and equal bilaterally. No wheezes/rales/rhonchi. Gastrointestinal: Soft and distended with diffuse tenderness.  Genitourinary: Deferred Musculoskeletal: Normal range of motion in all extremities. No lower extremity edema. Neurologic:  Normal speech and language. No gross focal neurologic deficits are appreciated.  Skin:  Skin is warm, dry and intact. No rash noted. Psychiatric: Mood and  affect are normal. Speech and behavior are normal. Patient exhibits appropriate insight and judgment.  ____________________________________________    LABS (pertinent positives/negatives)  CBC wbc 10.8, hgb 9.8, plt 169 CMP na 133, k 3.3, cr 1.67 Trop hs 12  ____________________________________________   EKG  None  ____________________________________________    RADIOLOGY  None  ____________________________________________   PROCEDURES  Procedures  ____________________________________________   INITIAL IMPRESSION / ASSESSMENT AND PLAN / ED COURSE  Pertinent labs & imaging results that were available during my care of the patient were reviewed by me and considered in my medical decision making (see chart for details).   Patient presented to the emergency department today with primary concern for diffuse weakness.  She also has complaints of some abdominal pain.  On exam patient without any focal weakness.  Blood work does show elevation of her creatinine over baseline.  Do think patient is dehydrated.  Her abdominal exam shows some diffuse abdominal tenderness.  She did recently have a paracentesis.  Bedside ultrasound does show small to moderate amount of ascites.  No significant leukocytosis or fevers.  Did start IV fluids.  Will plan on admission for further work-up and management. Discussed findings and plan with patient.    ____________________________________________   FINAL CLINICAL IMPRESSION(S) / ED DIAGNOSES  Final diagnoses:  Weakness  AKI (acute kidney injury) ([place])     Note: This dictation was prepared with Dragon dictation. Any transcriptional errors that result from this process are unintentional     Nance Pear, MD 07/04/19 [phone]

## 2019-07-04 NOTE — ED Notes (Signed)
Patient is talking with Dr. Archie Balboa

## 2019-07-05 ENCOUNTER — Telehealth: Payer: Self-pay | Admitting: Internal Medicine

## 2019-07-05 ENCOUNTER — Encounter: Payer: Self-pay | Admitting: Family Medicine

## 2019-07-05 ENCOUNTER — Other Ambulatory Visit: Payer: Self-pay | Admitting: Internal Medicine

## 2019-07-05 ENCOUNTER — Other Ambulatory Visit: Payer: Self-pay

## 2019-07-05 ENCOUNTER — Inpatient Hospital Stay: Payer: Commercial Managed Care - PPO

## 2019-07-05 DIAGNOSIS — Z515 Encounter for palliative care: Secondary | ICD-10-CM | POA: Diagnosis not present

## 2019-07-05 DIAGNOSIS — K668 Other specified disorders of peritoneum: Secondary | ICD-10-CM

## 2019-07-05 DIAGNOSIS — R1030 Lower abdominal pain, unspecified: Secondary | ICD-10-CM | POA: Diagnosis not present

## 2019-07-05 DIAGNOSIS — N179 Acute kidney failure, unspecified: Secondary | ICD-10-CM | POA: Diagnosis not present

## 2019-07-05 LAB — URINALYSIS, COMPLETE (UACMP) WITH MICROSCOPIC
Bilirubin Urine: NEGATIVE
Glucose, UA: NEGATIVE mg/dL
Hgb urine dipstick: NEGATIVE
Ketones, ur: 5 mg/dL — AB
Leukocytes,Ua: NEGATIVE
Nitrite: NEGATIVE
Protein, ur: NEGATIVE mg/dL
Specific Gravity, Urine: 1.013 (ref 1.005–1.030)
pH: 5 (ref 5.0–8.0)

## 2019-07-05 LAB — CBC
HCT: 27.4 % — ABNORMAL LOW (ref 36.0–46.0)
Hemoglobin: 9.4 g/dL — ABNORMAL LOW (ref 12.0–15.0)
MCH: 34.3 pg — ABNORMAL HIGH (ref 26.0–34.0)
MCHC: 34.3 g/dL (ref 30.0–36.0)
MCV: 100 fL (ref 80.0–100.0)
Platelets: 165 10*3/uL (ref 150–400)
RBC: 2.74 MIL/uL — ABNORMAL LOW (ref 3.87–5.11)
RDW: 13.5 % (ref 11.5–15.5)
WBC: 9.7 10*3/uL (ref 4.0–10.5)
nRBC: 0 % (ref 0.0–0.2)

## 2019-07-05 LAB — BASIC METABOLIC PANEL
Anion gap: 16 — ABNORMAL HIGH (ref 5–15)
BUN: 78 mg/dL — ABNORMAL HIGH (ref 8–23)
CO2: 17 mmol/L — ABNORMAL LOW (ref 22–32)
Calcium: 7.8 mg/dL — ABNORMAL LOW (ref 8.9–10.3)
Chloride: 101 mmol/L (ref 98–111)
Creatinine, Ser: 1.54 mg/dL — ABNORMAL HIGH (ref 0.44–1.00)
GFR calc Af Amer: 41 mL/min — ABNORMAL LOW (ref >60)
GFR calc non Af Amer: 35 mL/min — ABNORMAL LOW (ref >60)
Glucose, Bld: 127 mg/dL — ABNORMAL HIGH (ref 70–99)
Potassium: 3 mmol/L — ABNORMAL LOW (ref 3.5–5.1)
Sodium: 134 mmol/L — ABNORMAL LOW (ref 135–145)

## 2019-07-05 LAB — HEMOGLOBIN A1C
Hgb A1c MFr Bld: 6.5 % — ABNORMAL HIGH (ref 4.8–5.6)
Mean Plasma Glucose: 139.85 mg/dL

## 2019-07-05 LAB — GLUCOSE, CAPILLARY
Glucose-Capillary: 96 mg/dL (ref 70–99)
Glucose-Capillary: 114 mg/dL — ABNORMAL HIGH (ref 70–99)
Glucose-Capillary: 159 mg/dL — ABNORMAL HIGH (ref 70–99)
Glucose-Capillary: 120 mg/dL — ABNORMAL HIGH (ref 70–99)

## 2019-07-05 LAB — HIV ANTIBODY (ROUTINE TESTING W REFLEX): HIV Screen 4th Generation wRfx: NONREACTIVE

## 2019-07-05 MED ORDER — INSULIN ASPART 100 UNIT/ML ~~LOC~~ SOLN
0.0000 [IU] | SUBCUTANEOUS | Status: DC
  Administered 2019-07-05 – 2019-07-06 (×3): 2 [IU] via SUBCUTANEOUS
  Administered 2019-07-07: 1 [IU] via SUBCUTANEOUS
  Filled 2019-07-05 (×4): qty 1

## 2019-07-05 MED ORDER — IOHEXOL 9 MG/ML PO SOLN
500.0000 mL | ORAL | Status: AC
  Administered 2019-07-05: 500 mL via ORAL

## 2019-07-05 MED ORDER — PANTOPRAZOLE SODIUM 40 MG IV SOLR
40.0000 mg | INTRAVENOUS | Status: DC
  Administered 2019-07-05 – 2019-07-11 (×7): 40 mg via INTRAVENOUS
  Filled 2019-07-05 (×7): qty 40

## 2019-07-05 MED ORDER — SODIUM CHLORIDE 0.9 % IV SOLN
1.0000 g | Freq: Two times a day (BID) | INTRAVENOUS | Status: DC
  Administered 2019-07-05 – 2019-07-07 (×4): 1 g via INTRAVENOUS
  Filled 2019-07-05 (×7): qty 1

## 2019-07-05 MED ORDER — ENOXAPARIN SODIUM 40 MG/0.4ML ~~LOC~~ SOLN: 40.0000 mg | SUBCUTANEOUS | Status: DC

## 2019-07-05 MED ORDER — SODIUM BICARBONATE-DEXTROSE 150-5 MEQ/L-% IV SOLN
150.0000 meq | INTRAVENOUS | Status: DC
  Administered 2019-07-05: 150 meq via INTRAVENOUS
  Filled 2019-07-05 (×2): qty 1000

## 2019-07-05 MED ORDER — IOHEXOL 300 MG/ML  SOLN
75.0000 mL | Freq: Once | INTRAMUSCULAR | Status: AC | PRN
  Administered 2019-07-05: 75 mL via INTRAVENOUS

## 2019-07-05 MED ORDER — LACTATED RINGERS IV SOLN: INTRAVENOUS | Status: DC

## 2019-07-05 MED ORDER — LEVOTHYROXINE SODIUM 100 MCG/5ML IV SOLN
50.0000 ug | Freq: Every day | INTRAVENOUS | Status: DC
  Administered 2019-07-06 – 2019-07-08 (×3): 50 ug via INTRAVENOUS
  Filled 2019-07-05 (×3): qty 5

## 2019-07-05 NOTE — Consult Note (Signed)
Patient ID: TUERE DARRIN, female   DOB: [birth year], 64 y.o.   MRN: [id number]  HPI Meredith Jones is a 64 y.o. female seen in consultation at the request of Dr. Posey Pronto (discussed situation with him in detail) for questionable free air on KUB done this morning.  Of note the patient had a history of ovarian high-grade serous carcinoma stage IVb and had a total abdominal hysterectomy with bilateral salpingo-oophorectomy and appendectomy by Dr. Leonides Schanz on October 2020 Dr. Theora Gianotti assisted her.  She did receive neoadjuvant chemotherapy and adjuvant chemotherapy.  Dr. Janese Banks from oncology has been following her.  She saw her last week in her office and noted significant abdominal distention.  Did receive Avastin but it has been more than 4 months since her last dose.  She underwent paracentesis 5 days ago and is positive for malignancy and was consistent with high-grade serous carcinoma.  She reports that she has been having some intermittent abdominal discomfort.  She has had significant bloating for the last couple months or so and severe weakness.  She has lost significant weight and right now she is at 53 kg. She was admitted last night for failure to thrive.  Today KUB showed questionable free air.  I have personally reviewed the images and is consistent with free air but I cannot tell 100%. I went into her room she actually just had lunch and ate about a third of her sandwich.  She ate breakfast this morning.  She is not complaining of any abdominal pain.  No fevers no chills. White count is normal and creatinine is 1.5.   HPI  Past Medical History:  Diagnosis Date  . Allergy   . Anemia   . Complication of anesthesia   . Diabetes mellitus without complication ([place])   . Diverticulosis   . Family history of adverse reaction to anesthesia    mom-n/v  . Family history of lung cancer   . GERD (gastroesophageal reflux disease)    h/o  . Hyperlipidemia   . Hypothyroidism   . Ovarian cancer ([place])  08/04/2018   Chemo tx's and Hysterectomy.  . Personal history of chemotherapy   . PONV (postoperative nausea and vomiting)    sick with tonsillectomy  . 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 Thyroid disease     Past Surgical History:  Procedure Laterality Date  . ABDOMINAL HYSTERECTOMY    . BACK SURGERY  2007   Lumbar Surgery  . BREAST CYST ASPIRATION Right    Around 10 years ago. Pt thinks it was the right breast but not sure.  Marland Kitchen CATARACT EXTRACTION Left 2010   Right Eye 2011  . COLONOSCOPY WITH PROPOFOL N/A 09/02/2015   Procedure: COLONOSCOPY WITH PROPOFOL;  Surgeon: [name], MD;  Location: [place];  Service: Endoscopy;  Laterality: N/A;  Diabetic - oral meds  . ESOPHAGOGASTRODUODENOSCOPY (EGD) WITH PROPOFOL N/A 11/03/2015   Procedure: ESOPHAGOGASTRODUODENOSCOPY (EGD) WITH PROPOFOL;  Surgeon: [name], MD;  Location: [place];  Service: Endoscopy;  Laterality: N/A;  . EYE SURGERY Bilateral 06/2014    tear duck catherization ( Dr. Vallarie Mare)  . LAPAROSCOPIC APPENDECTOMY  11/26/2018   Procedure: APPENDECTOMY LAPAROSCOPIC;  Surgeon: Ward, Honor Loh, MD;  Location: [facility];  Service: Gynecology;;  . POLYPECTOMY  09/02/2015   Procedure: POLYPECTOMY;  Surgeon: [name], MD;  Location: [place];  Service: Endoscopy;;  . TONSILLECTOMY  1963    Family History  Problem Relation Age of Onset  .  Congestive Heart Failure Mother   . Diabetes Mother   . Stroke Mother   . Hypothyroidism Father   . Cancer Father        lung cancer  . Diabetes Father   . Cancer Paternal Grandmother        stomach  . Breast cancer Neg Hx     Social History Social History   Tobacco Use  . Smoking status: Never Smoker  . Smokeless tobacco: Never Used  Substance Use Topics  . Alcohol use: No  . Drug use: No    Allergies  Allergen Reactions  . Clarithromycin Nausea Only  . Doxycycline Nausea Only    dizziness  . Penicillins Hives    Did it  involve swelling of the face/tongue/throat, SOB, or low BP? No Did it involve sudden or severe rash/hives, skin peeling, or any reaction on the inside of your mouth or nose? No Did you need to seek medical attention at a hospital or doctor's office? No When did it last happen?50+ years ago If all above answers are "NO", may proceed with cephalosporin use.     Current Facility-Administered Medications  Medication Dose Route Frequency Provider Last Rate Last Admin  . insulin aspart (novoLOG) injection 0-9 Units  0-9 Units Subcutaneous TID WC Tu, Ching T, DO      . lactated ringers infusion   Intravenous Continuous Lavina Hamman, MD      . Derrill Memo ON 07/06/2019] levothyroxine (SYNTHROID, LEVOTHROID) injection 50 mcg  50 mcg Intravenous Daily Lavina Hamman, MD      . meropenem (MERREM) 1 g in sodium chloride 0.9 % 100 mL IVPB  1 g Intravenous Q12H Lavina Hamman, MD      . pantoprazole (PROTONIX) injection 40 mg  40 mg Intravenous Q24H Lavina Hamman, MD       Facility-Administered Medications Ordered in Other Encounters  Medication Dose Route Frequency Provider Last Rate Last Admin  . sodium chloride flush (NS) 0.9 % injection 10 mL  10 mL Intravenous Once Sindy Guadeloupe, MD      . sodium chloride flush (NS) 0.9 % injection 10 mL  10 mL Intravenous Once Sindy Guadeloupe, MD      . sodium chloride flush (NS) 0.9 % injection 10 mL  10 mL Intravenous Once Sindy Guadeloupe, MD         Review of Systems Full ROS  was asked and was negative except for the information on the HPI  Physical Exam Blood pressure 102/63, pulse 84, temperature 97.6 F (36.4 C), temperature source Oral, resp. rate 17, height 5\' 4"  (1.626 m), weight 53.2 kg, SpO2 100 %. CONSTITUTIONAL: NAD, she is malnourished , and pleural wasting. EYES: Pupils are equal, round, and reactive to light, Sclera are non-icteric. EARS, NOSE, MOUTH AND THROAT:  Wearing a masks. Hearing is intact to voice. LYMPH NODES:  Lymph nodes  in the neck are normal. RESPIRATORY:  Lungs are clear. There is normal respiratory effort, with equal breath sounds bilaterally, and without pathologic use of accessory muscles. CARDIOVASCULAR: Heart is regular without murmurs, gallops, or rubs. GI: The abdomen is soft, distended nontender with no peritonitis.  GU: Rectal deferred.   MUSCULOSKELETAL: Normal muscle strength and tone. No cyanosis or edema.   SKIN: Turgor is good and there are no pathologic skin lesions or ulcers. NEUROLOGIC: Motor and sensation is grossly normal. Cranial nerves are grossly intact. PSYCH:  Oriented to person, place and time. Affect is normal.  Data Reviewed I have personally reviewed the patient's imaging, laboratory findings and medical records.    Assessment/Plan 64 year old female with ovarian serous carcinoma with clinical progression of her disease now with some free air.  Free air of unknown etiology, paracentesis can potentially explain some of the free air but it is unlikely given that has been 5 days.  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  I do think that is prudent to also consult oncology and gynecology oncology since this is more a gynecology oncology issue.  Currently the patient is not toxic she is notperitonitic and does not need an emergent surgical intervention.  She does need a definitive diagnosis therefore we need aq CT scan of the abdomen pelvis with contrast even though her creatinine is elevated We will follow after the CT scan with p.o. contrast and IV contrast is done.    Caroleen Hamman, MD FACS General Surgeon 07/05/2019, 2:41 PM

## 2019-07-05 NOTE — Progress Notes (Signed)
PHARMACIST - PHYSICIAN COMMUNICATION  CONCERNING:  Enoxaparin (Lovenox) for DVT Prophylaxis    RECOMMENDATION: Current orders for enoxaparin 30mg  SQ Q24H for VTE prophylaxis.  Renal function improving, per protocol will adjust to enoxaparin 40mg  SQ Q24H (for CrCl >64ml)  Filed Weights   07/04/19 1628  Weight: 54.2 kg (119 lb 8 oz)    Body mass index is 20.51 kg/m.  Estimated Creatinine Clearance: 31.6 mL/min (A) (by C-G formula based on SCr of 1.54 mg/dL (H)).   Cheri Guppy, PharmD Clinical Pharmacist  07/05/2019 10:02 AM

## 2019-07-05 NOTE — Progress Notes (Signed)
Ct pers. Reviewed and d/w radiologist. Pneumoperitoneum w/o obvious cause. No evidence of ischemia or specific bowel pathology. Given exam and history I do think that is prudent to manage her conservatively. She definitely has progression of peritoneal disease and has poor prognosis. She is cachectic and debilitated and I do not believe a Major surgery would be in her best interest, especially given the fact that we have failed to identify the culprit. She understands. If she were to deteriorate we will offer her exploratory laparotomy.  Keep NPO, IV A/bs and serial abd exams.

## 2019-07-05 NOTE — Plan of Care (Signed)
  Problem: Education: Goal: Knowledge of General Education information will improve Description: Including pain rating scale, medication(s)/side effects and non-pharmacologic comfort measures Outcome: Progressing   Problem: Clinical Measurements: Goal: Respiratory complications will improve Outcome: Progressing Goal: Cardiovascular complication will be avoided Outcome: Progressing   Problem: Coping: Goal: Level of anxiety will decrease Outcome: Progressing   

## 2019-07-05 NOTE — Consult Note (Signed)
[facility] CONSULT NOTE  Patient Care Team: Rubye Beach as PCP - General (Family Medicine) Clent Jacks, RN as Oncology Nurse Navigator  CHIEF COMPLAINTS/PURPOSE OF CONSULTATION: Abdominal distention/bowel perforation  HISTORY OF PRESENTING ILLNESS:  Meredith Jones 64 y.o.  female patient with history of high-grade serous ovarian cancer [follows up with Dr. Rao] is currently admitted hospital for abdominal distention/generalized weakness.   Patient most recently on Avastin maintenance; s/p debulking surgery in October 2020.  Patient was recently evaluated patient basis for abdominal distention; for which she had paracentesis on May 18 taking about a liter of fluid.  Clinically patient, felt to have progressive disease/platinum resistant-and is awaiting restarting of also the chemotherapy.  Patient noted to have abdominal distention/mild abdominal discomfort which led to evaluation emergency room.  CT scan abdomen pelvis shows-small bowel distention; also pneumoperitoneum.  The source of pneumoperitoneum is unclear based on imaging.   Patient is resting comfortably; she is alone.  Denies any significant abdominal pain at this time  Review of Systems  Constitutional: Positive for malaise/fatigue. Negative for chills, diaphoresis, fever and weight loss.  HENT: Negative for nosebleeds and sore throat.   Eyes: Negative for double vision.  Respiratory: Negative for cough, hemoptysis, sputum production, shortness of breath and wheezing.   Cardiovascular: Negative for chest pain, palpitations, orthopnea and leg swelling.  Gastrointestinal: Positive for abdominal pain and nausea. Negative for blood in stool, constipation, diarrhea, heartburn, melena and vomiting.       Abdominal distention  Genitourinary: Negative for dysuria, frequency and urgency.  Musculoskeletal: Negative for back pain and joint pain.  Skin: Negative.  Negative for itching and rash.   Neurological: Negative for dizziness, tingling, focal weakness, weakness and headaches.  Endo/Heme/Allergies: Does not bruise/bleed easily.  Psychiatric/Behavioral: Negative for depression. The patient is not nervous/anxious and does not have insomnia.      MEDICAL HISTORY:  Past Medical History:  Diagnosis Date  . Allergy   . Anemia   . Complication of anesthesia   . Diabetes mellitus without complication ([place])   . Diverticulosis   . Family history of adverse reaction to anesthesia    mom-n/v  . Family history of lung cancer   . GERD (gastroesophageal reflux disease)    h/o  . Hyperlipidemia   . Hypothyroidism   . Ovarian cancer ([place]) 08/04/2018   Chemo tx's and Hysterectomy.  . Personal history of chemotherapy   . PONV (postoperative nausea and vomiting)    sick with tonsillectomy  . 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 Thyroid disease     SURGICAL HISTORY: Past Surgical History:  Procedure Laterality Date  . ABDOMINAL HYSTERECTOMY    . BACK SURGERY  2007   Lumbar Surgery  . BREAST CYST ASPIRATION Right    Around 10 years ago. Pt thinks it was the right breast but not sure.  Marland Kitchen CATARACT EXTRACTION Left 2010   Right Eye 2011  . COLONOSCOPY WITH PROPOFOL N/A 09/02/2015   Procedure: COLONOSCOPY WITH PROPOFOL;  Surgeon: [name], MD;  Location: [place];  Service: Endoscopy;  Laterality: N/A;  Diabetic - oral meds  . ESOPHAGOGASTRODUODENOSCOPY (EGD) WITH PROPOFOL N/A 11/03/2015   Procedure: ESOPHAGOGASTRODUODENOSCOPY (EGD) WITH PROPOFOL;  Surgeon: [name], MD;  Location: [place];  Service: Endoscopy;  Laterality: N/A;  . EYE SURGERY Bilateral 06/2014    tear duck catherization ( Dr. Vallarie Mare)  . LAPAROSCOPIC APPENDECTOMY  11/26/2018   Procedure: APPENDECTOMY LAPAROSCOPIC;  Surgeon:  Ward, Honor Loh, MD;  Location: [facility];  Service: Gynecology;;  . POLYPECTOMY  09/02/2015   Procedure: POLYPECTOMY;  Surgeon: [name], MD;  Location:  [facility];  Service: Endoscopy;;  . TONSILLECTOMY  1963    SOCIAL HISTORY: Social History   Socioeconomic History  . Marital status: Single    Spouse name: Not on file  . Number of children: Not on file  . Years of education: Not on file  . Highest education level: Not on file  Occupational History  . Not on file  Tobacco Use  . Smoking status: Never Smoker  . Smokeless tobacco: Never Used  Substance and Sexual Activity  . Alcohol use: No  . Drug use: No  . Sexual activity: Not Currently  Other Topics Concern  . Not on file  Social History Narrative  . Not on file   Social Determinants of Health   Financial Resource Strain:   . Difficulty of Paying Living Expenses:   Food Insecurity:   . Worried About Charity fundraiser in the Last Year:   . Arboriculturist in the Last Year:   Transportation Needs:   . Film/video editor (Medical):   Marland Kitchen Lack of Transportation (Non-Medical):   Physical Activity:   . Days of Exercise per Week:   . Minutes of Exercise per Session:   Stress:   . Feeling of Stress :   Social Connections:   . Frequency of Communication with Friends and Family:   . Frequency of Social Gatherings with Friends and Family:   . Attends Religious Services:   . Active Member of Clubs or Organizations:   . Attends Archivist Meetings:   Marland Kitchen Marital Status:   Intimate Partner Violence:   . Fear of Current or Ex-Partner:   . Emotionally Abused:   Marland Kitchen Physically Abused:   . Sexually Abused:     FAMILY HISTORY: Family History  Problem Relation Age of Onset  . Congestive Heart Failure Mother   . Diabetes Mother   . Stroke Mother   . Hypothyroidism Father   . Cancer Father        lung cancer  . Diabetes Father   . Cancer Paternal Grandmother        stomach  . Breast cancer Neg Hx     ALLERGIES:  is allergic to clarithromycin; doxycycline; and penicillins.  MEDICATIONS:  Current Facility-Administered Medications  Medication  Dose Route Frequency Provider Last Rate Last Admin  . insulin aspart (novoLOG) injection 0-9 Units  0-9 Units Subcutaneous Q4H Lavina Hamman, MD   2 Units at 07/05/19 1636  . [START ON 07/06/2019] levothyroxine (SYNTHROID, LEVOTHROID) injection 50 mcg  50 mcg Intravenous Daily Lavina Hamman, MD      . meropenem [facility]) 1 g in sodium chloride 0.9 % 100 mL IVPB  1 g Intravenous Q12H Lavina Hamman, MD   Stopped at 07/05/19 1729  . pantoprazole (PROTONIX) injection 40 mg  40 mg Intravenous Q24H Lavina Hamman, MD   40 mg at 07/05/19 1637  . sodium bicarbonate 150 mEq in dextrose 5% 1000 mL infusion  150 mEq Intravenous Continuous Lavina Hamman, MD 75 mL/hr at 07/05/19 1644 150 mEq at 07/05/19 1644   Facility-Administered Medications Ordered in Other Encounters  Medication Dose Route Frequency Provider Last Rate Last Admin  . sodium chloride flush (NS) 0.9 % injection 10 mL  10 mL Intravenous Once Sindy Guadeloupe, MD      .  sodium chloride flush (NS) 0.9 % injection 10 mL  10 mL Intravenous Once Sindy Guadeloupe, MD      . sodium chloride flush (NS) 0.9 % injection 10 mL  10 mL Intravenous Once Sindy Guadeloupe, MD          .  PHYSICAL EXAMINATION:  Vitals:   07/05/19 1528 07/05/19 2039  BP: 102/66 105/68  Pulse: 82 86  Resp: 13 18  Temp: 97.6 F (36.4 C) 97.7 F (36.5 C)  SpO2: 99% 97%   Filed Weights   07/04/19 1628 07/05/19 1057  Weight: 119 lb 8 oz (54.2 kg) 117 lb 4.6 oz (53.2 kg)    Physical Exam  Constitutional: She is oriented to person, place, and time.  Thin cachectic appearing Caucasian female patient; resting comfortably in the bed.  She is alone.  HENT:  Head: Normocephalic and atraumatic.  Mouth/Throat: Oropharynx is clear and moist. No oropharyngeal exudate.  Eyes: Pupils are equal, round, and reactive to light.  Cardiovascular: Normal rate and regular rhythm.  Pulmonary/Chest: Effort normal and breath sounds normal. No respiratory distress. She has no wheezes.   Abdominal: Soft. Bowel sounds are normal. She exhibits distension. She exhibits no mass. There is no abdominal tenderness. There is no rebound and no guarding.  No peritoneal signs noted.  No guarding or rigidity noted.  Musculoskeletal:        General: No tenderness or edema. Normal range of motion.     Cervical back: Normal range of motion and neck supple.  Neurological: She is alert and oriented to person, place, and time.  Skin: Skin is warm.  Psychiatric: Affect normal.     LABORATORY DATA:  I have reviewed the data as listed Lab Results  Component Value Date   WBC 9.7 07/05/2019   HGB 9.4 (L) 07/05/2019   HCT 27.4 (L) 07/05/2019   MCV 100.0 07/05/2019   PLT 165 07/05/2019   Recent Labs    07/23/18 1147 08/08/18 1053 06/08/19 0934 06/08/19 0934 06/29/19 0836 07/04/19 1631 07/05/19 0717  NA 138   < > 131*   < > 132* 133* 134*  K 4.1   < > 4.0   < > 3.7 3.3* 3.0*  CL 104   < > 91*   < > 95* 97* 101  CO2 24   < > 31   < > 23 20* 17*  GLUCOSE 106*   < > 130*   < > 188* 98 127*  BUN 15   < > 15   < > 55* 75* 78*  CREATININE 0.74   < > 0.50   < > 1.17* 1.67* 1.54*  CALCIUM 8.5*   < > 9.6   < > 8.9 8.0* 7.8*  GFRNONAA >60   < > >60   < > 49* 32* 35*  GFRAA >60   < > >60   < > 57* 37* 41*  PROT 6.3*   < > 7.3  --  6.3* 5.5*  --   ALBUMIN 2.5*   < > 3.5  --  2.5* 2.0*  --   AST 39   < > 26  --  30 25  --   ALT 20   < > 25  --  23 19  --   ALKPHOS 50   < > 45  --  73 88  --   BILITOT 0.5   < > 0.5  --  0.5 1.0  --   BILIDIR <0.1  --   --   --   --   --   --  IBILI NOT CALCULATED  --   --   --   --   --   --    < > = values in this interval not displayed.    RADIOGRAPHIC STUDIES: I have personally reviewed the radiological images as listed and agreed with the findings in the report. CT ABDOMEN PELVIS W CONTRAST  Result Date: 07/05/2019 CLINICAL DATA:  History of ovarian cancer, free air in the abdomen on prior radiographs EXAM: CT ABDOMEN AND PELVIS WITH CONTRAST  TECHNIQUE: Multidetector CT imaging of the abdomen and pelvis was performed using the standard protocol following bolus administration of intravenous contrast. CONTRAST:  27mL OMNIPAQUE IOHEXOL 300 MG/ML  SOLN COMPARISON:  Same-day abdominal radiographs, CT abdomen pelvis, 06/01/2019 FINDINGS: Lower chest: No acute abnormality. Hepatobiliary: No solid liver abnormality is seen. No gallstones, gallbladder wall thickening, or biliary dilatation. Pancreas: Unremarkable. No pancreatic ductal dilatation or surrounding inflammatory changes. Spleen: Normal in size without significant abnormality. Adrenals/Urinary Tract: Adrenal glands are unremarkable. Kidneys are normal, without renal calculi, solid lesion, or hydronephrosis. Bladder is unremarkable. Stomach/Bowel: Stomach is within normal limits. The small bowel is diffusely fluid-filled, somewhat thickened and hyperenhancing although not overtly distended. The colon is generally decompressed with scattered gas present to the rectum. Sigmoid diverticulosis. Vascular/Lymphatic: Aortic atherosclerosis. No enlarged abdominal or pelvic lymph nodes. Reproductive: Status post hysterectomy and oophorectomy. Other: No abdominal wall hernia or abnormality. Anasarca. Small volume pneumoperitoneum. Small volume ascites throughout the abdomen and pelvis. There are minimal areas of peritoneal thickening and nodularity, example in the right paracolic gutter, consistent with peritoneal metastatic disease. Musculoskeletal: No acute or significant osseous findings. IMPRESSION: 1. Small volume pneumoperitoneum, in keeping with findings of prior radiographs. 2. The small bowel is diffusely fluid-filled, somewhat thickened and hyperenhancing, although not overtly distended. There is no pneumatosis, portal venous gas, or other specific findings to localize source of pneumoperitoneum. 3. 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 4.  Diverticulosis without evidence of acute diverticulitis. 5.  Aortic Atherosclerosis (ICD10-I70.0). Findings discussed by telephone with Dr. Dahlia Byes at the time of interpretation. Electronically Signed   By: Eddie Candle M.D.   On: 07/05/2019 17:32   US RENAL  Result Date: 07/05/2019 CLINICAL DATA:  Acute renal injury, history of ovarian carcinoma EXAM: RENAL / URINARY TRACT ULTRASOUND COMPLETE COMPARISON:  None. FINDINGS: Right Kidney: Renal measurements: 9.9 x 3.8 x 5.2 cm. = volume: 104 mL. Mild increased echogenicity is noted. No mass lesion or hydronephrosis is seen. Left Kidney: Renal measurements: 9.6 x 4.9 x 5.1 cm. = volume: 128 mL. Increased echogenicity is noted. No mass lesion or hydronephrosis is noted. Bladder: Partially decompressed Other: None. IMPRESSION: Mild increased echogenicity consistent with medical renal disease. No obstructive changes are noted. Electronically Signed   By: Inez Catalina M.D.   On: 07/05/2019 13:11   US Abdomen Limited  Result Date: 07/05/2019 CLINICAL DATA:  Abdominal distension, possible ascites EXAM: LIMITED ABDOMEN ULTRASOUND FOR ASCITES TECHNIQUE: Limited ultrasound survey for ascites was performed in all four abdominal quadrants. COMPARISON:  None. FINDINGS: Minimal ascites is noted. Previously seen plain film showed significant small bowel dilatation. IMPRESSION: Minimal ascites. This is not the etiology of the patient's abdominal distension. CT of the abdomen and pelvis is again recommended for further evaluation. Electronically Signed   By: Inez Catalina M.D.   On: 07/05/2019 13:12   US Paracentesis  Result Date: 06/30/2019 INDICATION: Ovarian cancer with ascites.  Request for diagnostic and therapeutic paracentesis. EXAM: ULTRASOUND GUIDED PARACENTESIS MEDICATIONS: 1% lidocaine 10 mL COMPLICATIONS: None immediate. PROCEDURE: Informed written consent was obtained from the patient after a discussion  of the risks, benefits and alternatives to treatment. A timeout was performed prior to the initiation of the procedure. Initial ultrasound scanning demonstrates a moderate amount of ascites within the right lower abdominal quadrant. The right lower abdomen was prepped and draped in the usual sterile fashion. 1% lidocaine was used for local anesthesia. Following this, a 6 Fr Safe-T-Centesis catheter was introduced. An ultrasound image was saved for documentation purposes. The paracentesis was performed. The catheter was removed and a dressing was applied. The patient tolerated the procedure well without immediate post procedural complication. FINDINGS: A total of approximately 1 L of clear yellow fluid was removed. Samples were sent to the laboratory as requested by the clinical team. IMPRESSION: Successful ultrasound-guided paracentesis yielding 1 liter of peritoneal fluid. Read by: Gareth Eagle, PA-C Electronically Signed   By: Marcello Moores  Register   On: 06/30/2019 11:21   DG Abd Portable 1V  Result Date: 07/05/2019 CLINICAL DATA:  Abdominal distension EXAM: PORTABLE ABDOMEN - 1 VIEW COMPARISON:  06/01/2019 FINDINGS: Scattered large and small bowel gas is noted. Significant small bowel dilatation is noted to 7 cm. There are changes suspicious for free intraperitoneal air with regular sign and right upper quadrant air identified. CT of the abdomen and pelvis is recommended for further evaluation. No bony abnormality is seen. IMPRESSION: Significant dilatation of the small bowel with evidence of free air within the abdomen. CT of the abdomen and pelvis is recommended for further evaluation. Electronically Signed   By: Inez Catalina M.D.   On: 07/05/2019 09:16    Bilateral primary ovarian cancer [facility]) #64 year old female patient with a history of high-grade serous ovarian cancer currently on Avastin maintenance abnormality for abdominal distention/denies weakness  #Abdominal distention generalized weakness-small  bowel distention/evidence of pneumoperitoneum.  Etiology is unclear spontaneous bowel perforation secondary versus paracentesis/procedual.  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  Currently on Avastin maintenance.   #CODE STATUS: Discussed with patient the futility of full code in the context of her extremely poor prognosis.  Patient in agreement however needs more time to make a decision.  #Recommendation:  #Had a long discussion with patient regarding the seriousness of her current illness-which could potentially turn out to be fatal.  However patient is a high risk for surgery given her underlying peritoneal carcinomatosis/and her cachexia.  Patient is recommended conservative/surveillance at this time.  Appreciate recommendation surgery.  #I also spoke to Dr. Shon Baton; gynecology oncology, [facility] who feels patient has unfortunately poor prognosis/and if patient worsens clinically-supportive care/hospice would be recommended.  Also discussed with Dr. Kinnie Scales, [facility] surgery-again given patient's complicated medical history/progressive malignancy-conservative measures are recommended.  I had a long discussion the patient regarding above recommendations; and she is in agreement.  I also discussed patient's guarded prognosis the patient's friend Sharlene Motts.   #CODE STATUS: Currently full code; however patient understands the severity of the illness.  Will defer to Dr. Janese Banks, patient's primary oncologist to discuss further with the patient.   Thank you Dr.Patel for allowing me to participate in the care of your pleasant patient. Please do not hesitate to contact me with questions or concerns in the interim.  Discussed with Dr. Posey Pronto and Dr. Perrin Maltese.   #Dr. Janese Banks to follow-starting 05/24.    All questions were answered. The patient  knows to call the clinic with any problems, questions or concerns.    Cammie Sickle,  MD 07/05/2019 10:35 PM

## 2019-07-05 NOTE — ED Notes (Signed)
X-ray at bedside

## 2019-07-05 NOTE — Progress Notes (Signed)
[facility] Progress Note  [name]    [id number]  DOA: 07/04/2019     Date of Service: the patient was seen and examined on 07/05/2019  Chief Complaint  Patient presents with  . Weakness   Brief hospital course: Past medical history of high-grade serous adenocarcinoma stage IV.  SP chemotherapy.  Recent reoccurrence with ascites.  Hypothyroidism.  Type II IDDM.  Anemia chronic kidney disease. Now presents with complaints of fatigue and tiredness and found to have pneumoperitoneum likely from bowel rupture. General surgery, medical oncology consulted. Currently further plan is continue current measures.  Assessment and Plan: 1.  Pneumoperitoneum without any obvious cause. No evidence of ischemia or bowel pathology. Presents with generalized weakness. X-ray abdomen shows evidence of pneumoperitoneum. CT abdomen with contrast was performed which confirmed the diagnosis although does not identify the location. Patient does not have any evidence of peritonitis or abdominal pain. Patient is very cachectic and debilitated. Per [facility] risk assessment her risk for significant cardiovascular outcome is 2.9%. Current recommendation is to continue conservative measures with n.p.o., IV antibiotics and serial abdominal exam and if the patient's condition deteriorates offer surgery.  Patient would like to know other options and would like to discuss with [facility]. Appreciate medical oncology's assistance in this condition.  2.  Goals of care discussion Had multiple goals of care discussion with this patient today. Patient initially mentioned that she would like to continue full code she would like to try CPR but she would not like to remain on life support for more than 3 days if she does not improve. Her significant other will be her healthcare power of attorney in that situation. After knowing the diagnosis of pneumoperitoneum patient still would like to know all  about her options and would like to try all available therapies for this condition.  3.  Type 2 insulin-dependent diabetes mellitus uncontrolled with hyperglycemia Currently n.p.o. Every 4 hours CBG. Monitor.  4.  Hypothyroidism Change oral Synthroid to IV Synthroid.  5.  Acute kidney injury In the setting of bowel perforation. Patient required IV contrast to verify the diagnosis of pneumoperitoneum. Currently we will provide her bicarb fluid for next 24 hours. Transition to LR after that. Monitor renal function avoid nephrotoxic medications.  6.  UTI. Concern for intra-abdominal infection given her pneumoperitoneum Initially on IV ceftriaxone. Changed to IV meropenem on 07/05/2019.  Diet: N.p.o. DVT Prophylaxis: Subcutaneous Lovenox   Advance goals of care discussion: Full code  Family Communication: no family was present at bedside, at the time of interview.   Disposition:  Status is: Inpatient  Remains inpatient appropriate because:Ongoing diagnostic testing needed not appropriate for outpatient work up and IV treatments appropriate due to intensity of illness or inability to take PO   Dispo: The patient is from: Home              Anticipated d/c is to: SNF              Anticipated d/c date is: > 3 days              Patient currently is not medically stable to d/c.  Subjective: No abdominal pain.  No nausea no vomiting.  No fever no chills.  Continues to have fatigue and tiredness.  No shortness of breath.  Physical Exam: General:  alert oriented to time, place, and person.  Appear in mild distress, affect appropriate Eyes: PERRL ENT: Oral Mucosa Clear, moist  Neck: no JVD,  Cardiovascular: S1 and S2 Present, no Murmur,  Respiratory: good respiratory effort, Bilateral Air entry equal and Decreased, no Crackles, no wheezes Abdomen: Bowel Sound present, Soft and distended, mild diffuse tenderness,  Skin: no rash Extremities: no Pedal edema, no calf  tenderness Neurologic: without any new focal findings  Gait not checked due to patient safety concerns  Vitals:   07/05/19 1000 07/05/19 1050 07/05/19 1057 07/05/19 1528  BP: 106/68 102/63  102/66  Pulse: 89 84  82  Resp: (!) 21 17  13   Temp:  97.6 F (36.4 C)  97.6 F (36.4 C)  TempSrc:  Oral  Oral  SpO2: 100% 100%  99%  Weight:   53.2 kg   Height:   5\' 4"  (1.626 m)     Intake/Output Summary (Last 24 hours) at 07/05/2019 1907 Last data filed at 07/05/2019 1813 Gross per 24 hour  Intake 1013.96 ml  Output --  Net 1013.96 ml   Filed Weights   07/04/19 1628 07/05/19 1057  Weight: 54.2 kg 53.2 kg    Data Reviewed: I have personally reviewed and interpreted daily labs, tele strips, imagings as discussed above. I reviewed all nursing notes, pharmacy notes, vitals, pertinent old records I have discussed plan of care as described above with RN and patient/family.  CBC: Recent Labs  Lab 06/29/19 0836 07/04/19 1631 07/05/19 0717  WBC 7.7 10.8* 9.7  NEUTROABS 6.2 9.8*  --   HGB 11.7* 9.8* 9.4*  HCT 33.9* 28.1* 27.4*  MCV 99.4 96.9 100.0  PLT 304 169 [id number]   Basic Metabolic Panel: Recent Labs  Lab 06/29/19 0836 07/04/19 1631 07/05/19 0717  NA 132* 133* 134*  K 3.7 3.3* 3.0*  CL 95* 97* 101  CO2 23 20* 17*  GLUCOSE 188* 98 127*  BUN 55* 75* 78*  CREATININE 1.17* 1.67* 1.54*  CALCIUM 8.9 8.0* 7.8*    Studies: CT ABDOMEN PELVIS W CONTRAST  Result Date: 07/05/2019 CLINICAL DATA:  History of ovarian cancer, free air in the abdomen on prior radiographs EXAM: CT ABDOMEN AND PELVIS WITH CONTRAST TECHNIQUE: Multidetector CT imaging of the abdomen and pelvis was performed using the standard protocol following bolus administration of intravenous contrast. CONTRAST:  34mL OMNIPAQUE IOHEXOL 300 MG/ML  SOLN COMPARISON:  Same-day abdominal radiographs, CT abdomen pelvis, 06/01/2019 FINDINGS: Lower chest: No acute abnormality. Hepatobiliary: No solid liver abnormality is seen. No  gallstones, gallbladder wall thickening, or biliary dilatation. Pancreas: Unremarkable. No pancreatic ductal dilatation or surrounding inflammatory changes. Spleen: Normal in size without significant abnormality. Adrenals/Urinary Tract: Adrenal glands are unremarkable. Kidneys are normal, without renal calculi, solid lesion, or hydronephrosis. Bladder is unremarkable. Stomach/Bowel: Stomach is within normal limits. The small bowel is diffusely fluid-filled, somewhat thickened and hyperenhancing although not overtly distended. The colon is generally decompressed with scattered gas present to the rectum. Sigmoid diverticulosis. Vascular/Lymphatic: Aortic atherosclerosis. No enlarged abdominal or pelvic lymph nodes. Reproductive: Status post hysterectomy and oophorectomy. Other: No abdominal wall hernia or abnormality. Anasarca. Small volume pneumoperitoneum. Small volume ascites throughout the abdomen and pelvis. There are minimal areas of peritoneal thickening and nodularity, example in the right paracolic gutter, consistent with peritoneal metastatic disease. Musculoskeletal: No acute or significant osseous findings. IMPRESSION: 1. Small volume pneumoperitoneum, in keeping with findings of prior radiographs. 2. The small bowel is diffusely fluid-filled, somewhat thickened and hyperenhancing, although not overtly distended. There is no pneumatosis, portal venous gas, or other specific findings to localize source of pneumoperitoneum. 3. 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 4.  Diverticulosis without evidence of acute diverticulitis. 5.  Aortic Atherosclerosis (ICD10-I70.0). Findings discussed by telephone with Dr. Dahlia Byes at the time of interpretation. Electronically Signed   By: Eddie Candle M.D.   On: 07/05/2019 17:32   US RENAL  Result Date: 07/05/2019 CLINICAL DATA:  Acute renal injury,  history of ovarian carcinoma EXAM: RENAL / URINARY TRACT ULTRASOUND COMPLETE COMPARISON:  None. FINDINGS: Right Kidney: Renal measurements: 9.9 x 3.8 x 5.2 cm. = volume: 104 mL. Mild increased echogenicity is noted. No mass lesion or hydronephrosis is seen. Left Kidney: Renal measurements: 9.6 x 4.9 x 5.1 cm. = volume: 128 mL. Increased echogenicity is noted. No mass lesion or hydronephrosis is noted. Bladder: Partially decompressed Other: None. IMPRESSION: Mild increased echogenicity consistent with medical renal disease. No obstructive changes are noted. Electronically Signed   By: Inez Catalina M.D.   On: 07/05/2019 13:11   US Abdomen Limited  Result Date: 07/05/2019 CLINICAL DATA:  Abdominal distension, possible ascites EXAM: LIMITED ABDOMEN ULTRASOUND FOR ASCITES TECHNIQUE: Limited ultrasound survey for ascites was performed in all four abdominal quadrants. COMPARISON:  None. FINDINGS: Minimal ascites is noted. Previously seen plain film showed significant small bowel dilatation. IMPRESSION: Minimal ascites. This is not the etiology of the patient's abdominal distension. CT of the abdomen and pelvis is again recommended for further evaluation. Electronically Signed   By: Inez Catalina M.D.   On: 07/05/2019 13:12   DG Abd Portable 1V  Result Date: 07/05/2019 CLINICAL DATA:  Abdominal distension EXAM: PORTABLE ABDOMEN - 1 VIEW COMPARISON:  06/01/2019 FINDINGS: Scattered large and small bowel gas is noted. Significant small bowel dilatation is noted to 7 cm. There are changes suspicious for free intraperitoneal air with regular sign and right upper quadrant air identified. CT of the abdomen and pelvis is recommended for further evaluation. No bony abnormality is seen. IMPRESSION: Significant dilatation of the small bowel with evidence of free air within the abdomen. CT of the abdomen and pelvis is recommended for further evaluation. Electronically Signed   By: Inez Catalina M.D.   On: 07/05/2019 09:16     Scheduled Meds: . insulin aspart  0-9 Units Subcutaneous Q4H  . [START ON 07/06/2019] levothyroxine  50 mcg Intravenous Daily  . pantoprazole (PROTONIX) IV  40 mg Intravenous Q24H   Continuous Infusions: . meropenem (MERREM) IV Stopped (07/05/19 1729)  . sodium bicarbonate 150 mEq in dextrose 5% 1000 mL 150 mEq (07/05/19 1644)   PRN Meds:   Time spent: 35 minutes  Author: Berle Mull, MD [facility] 07/05/2019 7:07 PM  To reach On-call, see care teams to locate the attending and reach out to them via [email or web address]. If 7PM-7AM, please contact night-coverage If you still have difficulty reaching the attending provider, please page the [facility] (Director on Call) for [facility] on amion for assistance.

## 2019-07-05 NOTE — ED Notes (Signed)
Lab at bedside

## 2019-07-05 NOTE — ED Notes (Signed)
Spoke to [employer], Surveyor, mining, who stated she would give this RN a call back.

## 2019-07-05 NOTE — Progress Notes (Signed)
PT Cancellation Note  Patient Details Name: Meredith Jones MRN: [id number] DOB: Nov 02, 1955   Cancelled Treatment:    Reason Eval/Treat Not Completed: Medical issues which prohibited therapy(Pt with K+ of 3.0 today and downtrending since admission.)   Madilyn Hook 07/05/2019, 1:40 PM

## 2019-07-05 NOTE — ED Notes (Signed)
Pt assisted to bedside commode with 1 assist.

## 2019-07-05 NOTE — Assessment & Plan Note (Addendum)
#  64 year old female patient with a history of high-grade serous ovarian cancer currently on Avastin maintenance abnormality for abdominal distention/denies weakness  #Abdominal distention generalized weakness-small bowel distention/evidence of pneumoperitoneum.  Etiology is unclear spontaneous bowel perforation secondary versus paracentesis/procedual.  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  Currently on Avastin maintenance.   #CODE STATUS: Discussed with patient the futility of full code in the context of her extremely poor prognosis.  Patient in agreement however needs more time to make a decision.  #Recommendation:  #Had a long discussion with patient regarding the seriousness of her current illness-which could potentially turn out to be fatal.  However patient is a high risk for surgery given her underlying peritoneal carcinomatosis/and her cachexia.  Patient is recommended conservative/surveillance at this time.  Appreciate recommendation surgery.  #I also spoke to Dr. Shon Baton; gynecology oncology, [facility] who feels patient has unfortunately poor prognosis/and if patient worsens clinically-supportive care/hospice would be recommended.  Also discussed with Dr. Kinnie Scales, [facility] surgery-again given patient's complicated medical history/progressive malignancy-conservative measures are recommended.  I had a long discussion the patient regarding above recommendations; and she is in agreement.  I also discussed patient's guarded prognosis the patient's friend Sharlene Motts.   #CODE STATUS: Currently full code; however patient understands the severity of the illness.  Will defer to Dr. Janese Banks, patient's primary oncologist to discuss further with the patient.   Thank you Dr.Patel for allowing me to participate in the care of your pleasant patient. Please do not hesitate to contact me with questions or concerns in the interim.   Discussed with Dr. Posey Pronto and Dr. Perrin Maltese.   #Dr. Janese Banks to follow-starting 05/24.

## 2019-07-05 NOTE — Telephone Encounter (Signed)
I spoke to gyn-onc, Dr.Watson- who agrees overall poor prognosis. However, recommends reaching out to gen surgery team at [facility].  I was asked by transfer line [[phone]- [phone]] to have the radiology images pushed to be shared with [facility] system. The surgery team would review the images and contact us.   I have left messages for [facility]  radiology to share the images.  GB

## 2019-07-05 NOTE — ED Notes (Signed)
Pt assisted to bedside commode

## 2019-07-05 NOTE — Progress Notes (Signed)
Pharmacy Antibiotic Note  Meredith Jones is a 64 y.o. female admitted on 07/04/2019. Pharmacy has been consulted for meropenem dosing for intra-abdominal infection.  Plan: Meropenem 1 g IV q12h per current renal function  Height: 5\' 4"  (162.6 cm) Weight: 53.2 kg (117 lb 4.6 oz) IBW/kg (Calculated) : 54.7  Temp (24hrs), Avg:97.7 F (36.5 C), Min:97.6 F (36.4 C), Max:97.7 F (36.5 C)  Recent Labs  Lab 06/29/19 0836 07/04/19 1631 07/05/19 0717  WBC 7.7 10.8* 9.7  CREATININE 1.17* 1.67* 1.54*    Estimated Creatinine Clearance: 31 mL/min (A) (by C-G formula based on SCr of 1.54 mg/dL (H)).    Allergies  Allergen Reactions  . Clarithromycin Nausea Only  . Doxycycline Nausea Only    dizziness  . Penicillins Hives    Did it involve swelling of the face/tongue/throat, SOB, or low BP? No Did it involve sudden or severe rash/hives, skin peeling, or any reaction on the inside of your mouth or nose? No Did you need to seek medical attention at a hospital or doctor's office? No When did it last happen?50+ years ago If all above answers are "NO", may proceed with cephalosporin use.     Antimicrobials this admission: Ceftriaxone 5/22 x 1 Meropenem 5/23 >>  Dose adjustments this admission: NA    Thank you for allowing pharmacy to be a part of this patient's care.  Tawnya Crook, PharmD 07/05/2019 2:08 PM

## 2019-07-05 NOTE — Progress Notes (Signed)
Pt last received avastin on 4/26. Reviewed the chart.

## 2019-07-06 ENCOUNTER — Inpatient Hospital Stay: Payer: Commercial Managed Care - PPO

## 2019-07-06 DIAGNOSIS — K668 Other specified disorders of peritoneum: Secondary | ICD-10-CM | POA: Diagnosis not present

## 2019-07-06 DIAGNOSIS — Z515 Encounter for palliative care: Secondary | ICD-10-CM | POA: Diagnosis not present

## 2019-07-06 DIAGNOSIS — R1030 Lower abdominal pain, unspecified: Secondary | ICD-10-CM | POA: Diagnosis not present

## 2019-07-06 DIAGNOSIS — N179 Acute kidney failure, unspecified: Secondary | ICD-10-CM | POA: Diagnosis not present

## 2019-07-06 LAB — GLUCOSE, CAPILLARY
Glucose-Capillary: 150 mg/dL — ABNORMAL HIGH (ref 70–99)
Glucose-Capillary: 154 mg/dL — ABNORMAL HIGH (ref 70–99)
Glucose-Capillary: 179 mg/dL — ABNORMAL HIGH (ref 70–99)
Glucose-Capillary: 93 mg/dL (ref 70–99)
Glucose-Capillary: 94 mg/dL (ref 70–99)
Glucose-Capillary: 98 mg/dL (ref 70–99)

## 2019-07-06 LAB — RENAL FUNCTION PANEL
Albumin: 1.9 g/dL — ABNORMAL LOW (ref 3.5–5.0)
Anion gap: 10 (ref 5–15)
BUN: 49 mg/dL — ABNORMAL HIGH (ref 8–23)
CO2: 23 mmol/L (ref 22–32)
Calcium: 7.8 mg/dL — ABNORMAL LOW (ref 8.9–10.3)
Chloride: 102 mmol/L (ref 98–111)
Creatinine, Ser: 0.96 mg/dL (ref 0.44–1.00)
GFR calc Af Amer: >60 mL/min (ref >60)
GFR calc non Af Amer: >60 mL/min (ref >60)
Glucose, Bld: 113 mg/dL — ABNORMAL HIGH (ref 70–99)
Phosphorus: 2.5 mg/dL (ref 2.5–4.6)
Potassium: 2.7 mmol/L — CL (ref 3.5–5.1)
Sodium: 135 mmol/L (ref 135–145)

## 2019-07-06 LAB — CBC
HCT: 30.4 % — ABNORMAL LOW (ref 36.0–46.0)
Hemoglobin: 10 g/dL — ABNORMAL LOW (ref 12.0–15.0)
MCH: 34 pg (ref 26.0–34.0)
MCHC: 32.9 g/dL (ref 30.0–36.0)
MCV: 103.4 fL — ABNORMAL HIGH (ref 80.0–100.0)
Platelets: 152 10*3/uL (ref 150–400)
RBC: 2.94 MIL/uL — ABNORMAL LOW (ref 3.87–5.11)
RDW: 13.7 % (ref 11.5–15.5)
WBC: 6.5 10*3/uL (ref 4.0–10.5)
nRBC: 0 % (ref 0.0–0.2)

## 2019-07-06 MED ORDER — OCTREOTIDE ACETATE 100 MCG/ML IJ SOLN
100.0000 ug | Freq: Three times a day (TID) | INTRAMUSCULAR | Status: DC
  Administered 2019-07-06 – 2019-07-11 (×16): 100 ug via SUBCUTANEOUS
  Filled 2019-07-06 (×20): qty 1

## 2019-07-06 MED ORDER — METOCLOPRAMIDE HCL 5 MG/ML IJ SOLN: 10.0000 mg | Freq: Four times a day (QID) | INTRAMUSCULAR | Status: DC

## 2019-07-06 MED ORDER — LACTATED RINGERS IV SOLN: INTRAVENOUS | Status: DC

## 2019-07-06 MED ORDER — POTASSIUM CHLORIDE 10 MEQ/100ML IV SOLN
10.0000 meq | INTRAVENOUS | Status: AC
  Administered 2019-07-06 (×2): 10 meq via INTRAVENOUS

## 2019-07-06 MED ORDER — DEXAMETHASONE SODIUM PHOSPHATE 4 MG/ML IJ SOLN
10.0000 mg | Freq: Two times a day (BID) | INTRAMUSCULAR | Status: DC
  Administered 2019-07-06: 10 mg via INTRAVENOUS
  Filled 2019-07-06 (×2): qty 2.5

## 2019-07-06 MED ORDER — POTASSIUM CHLORIDE 10 MEQ/100ML IV SOLN
10.0000 meq | INTRAVENOUS | Status: AC
  Administered 2019-07-06 (×4): 10 meq via INTRAVENOUS
  Filled 2019-07-06 (×3): qty 100

## 2019-07-06 NOTE — Telephone Encounter (Signed)
I called Dr. Janese Banks about Ms. Blok who is hospitalized with high grade SBO, concern for bowel perforation on maintenance bevacizumab. CT findings favor progressive disease. If that is the case she has platinum-resistance disease based on my conversation with Dr. Janese Banks. I agree with conservative management, NGT decompression, anti-emetics, discuss possible role of antibiotics, goals of care discussion, and continue to follow with General Surgery.  Wahid Holley Gaetana Michaelis, MD

## 2019-07-06 NOTE — Progress Notes (Signed)
[facility] Progress Note  [name]    [id number]  DOA: 07/04/2019     Date of Service: the patient was seen and examined on 07/06/2019  Chief Complaint  Patient presents with  . Weakness   Brief hospital course: Past medical history of high-grade serous adenocarcinoma stage IV.  SP chemotherapy.  Recent reoccurrence with ascites.  Hypothyroidism.  Type II IDDM.  Anemia chronic kidney disease. Now presents with complaints of fatigue and tiredness and found to have pneumoperitoneum likely from bowel rupture. General surgery, medical oncology consulted. Currently further plan is continue current measures.  Assessment and Plan: 1.  Pneumoperitoneum without any obvious cause. No evidence of ischemia or bowel pathology. Small bowel obstruction Presents with generalized weakness. X-ray abdomen shows evidence of pneumoperitoneum. CT abdomen with contrast was performed which confirmed the diagnosis although does not identify the location. Patient does not have any evidence of peritonitis or abdominal pain. Patient is very cachectic and debilitated. Per Lyn[facility] Safe risk assessment her risk for significant cardiovascular outcome is 2.9%. Current recommendation is to continue conservative measures with n.p.o., IV antibiotics and serial abdominal exam and if the patient's condition deteriorates offer surgery.  Appreciate medical oncology's assistance in this condition. Medical oncology recommended to initiate the patient on Decadron and currently holding after 1 dose. They also recommended to start the patient on octreotide. Patient was also recommended to be on Reglan which I have discontinued given her bowel perforation. NG tube was inserted but patient pulled out it twice.  Currently given that there was no output after initial insertion of the NG tube and patient is pulling out this due to her orientation issues I will discontinue the NG tube.  2.  Goals of care discussion  Had multiple goals of care discussion with this patient today. Patient initially mentioned that she would like to continue full code she would like to try CPR but she would not like to remain on life support for more than 3 days if she does not improve. Her significant other will be her healthcare power of attorney in that situation. After knowing the diagnosis of pneumoperitoneum patient still would like to know all about her options and would like to try all available therapies for this condition. Palliative care discussed with patient currently DNR/DNI.  We will continue to monitor progress.  3.  Type 2 insulin-dependent diabetes mellitus uncontrolled with hyperglycemia Currently n.p.o. Every 4 hours CBG. Monitor.  4.  Hypothyroidism Change oral Synthroid to IV Synthroid.  5.  Acute kidney injury In the setting of bowel perforation. Patient required IV contrast to verify the diagnosis of pneumoperitoneum. Proving after overnight hydration with IV bicarb. Transition to LR  Monitor renal function avoid nephrotoxic medications.  6.  UTI. Concern for intra-abdominal infection given her pneumoperitoneum Initially on IV ceftriaxone. Changed to IV meropenem on 07/05/2019.  Diet: N.p.o. DVT Prophylaxis: Subcutaneous Lovenox   Advance goals of care discussion: Full code  Family Communication: no family was present at bedside, at the time of interview.   Disposition:  Status is: Inpatient  Remains inpatient appropriate because:Ongoing diagnostic testing needed not appropriate for outpatient work up and IV treatments appropriate due to intensity of illness or inability to take PO   Dispo: The patient is from: Home              Anticipated d/c is to: SNF              Anticipated d/c date is: > 3 days  Patient currently is not medically stable to d/c.  Subjective: has some abdominal pain, no nausea no fever..  NG tube.  Physical Exam: General:  alert oriented to time,  place, and person. Occasional confused.  Appear in mild distress, affect appropriate Eyes: PERRL ENT: Oral Mucosa Clear, moist  Neck: no JVD,  Cardiovascular: S1 and S2 Present, no Murmur,  Respiratory: good respiratory effort, Bilateral Air entry equal and Decreased, no Crackles, no wheezes Abdomen: Bowel Sound present, Soft and distended, mild diffuse tenderness,  Skin: no rash Extremities: no Pedal edema, no calf tenderness Neurologic: without any new focal findings  Gait not checked due to patient safety concerns  Vitals:   07/06/19 0546 07/06/19 0719 07/06/19 1203 07/06/19 1534  BP: 118/67 103/67 105/65 105/69  Pulse: 85 85 89 83  Resp: 18 17 17 17   Temp: 98 F (36.7 C) (!) 97.4 F (36.3 C) 97.8 F (36.6 C) 98.2 F (36.8 C)  TempSrc:  Oral    SpO2: 98% 99% 97% 100%  Weight:      Height:        Intake/Output Summary (Last 24 hours) at 07/06/2019 1952 Last data filed at 07/06/2019 0500 Gross per 24 hour  Intake 919.4 ml  Output -  Net 919.4 ml   Filed Weights   07/04/19 1628 07/05/19 1057  Weight: 54.2 kg 53.2 kg    Data Reviewed: I have personally reviewed and interpreted daily labs, tele strips, imagings as discussed above. I reviewed all nursing notes, pharmacy notes, vitals, pertinent old records I have discussed plan of care as described above with RN and patient/family.  CBC: Recent Labs  Lab 07/04/19 1631 07/05/19 0717 07/06/19 0623  WBC 10.8* 9.7 6.5  NEUTROABS 9.8*  --   --   HGB 9.8* 9.4* 10.0*  HCT 28.1* 27.4* 30.4*  MCV 96.9 100.0 103.4*  PLT 169 165 0000000   Basic Metabolic Panel: Recent Labs  Lab 07/04/19 1631 07/05/19 0717 07/06/19 0754  NA 133* 134* 135  K 3.3* 3.0* 2.7*  CL 97* 101 102  CO2 20* 17* 23  GLUCOSE 98 127* 113*  BUN 75* 78* 49*  CREATININE 1.67* 1.54* 0.96  CALCIUM 8.0* 7.8* 7.8*  PHOS  --   --  2.5    Studies: DG Lumbar Spine 2-3 Views  Result Date: 07/06/2019 CLINICAL DATA:  Back pain EXAM: LUMBAR SPINE - 2-3  VIEW COMPARISON:  Abdominal radiographs-earlier same day; CT abdomen and pelvis-07/05/2019 FINDINGS: There are 6 non rib-bearing lumbar type vertebral bodies. For the purposes of this dictation, lumbar levels will be labeled L1 through L6. Grade 1 anterolisthesis of L4 upon L5 measuring approximately 3 mm. No definite pars defects Lumbar vertebral body heights appear preserved Mild to moderate multilevel lumbar spine DDD, worse at L5-L6 with disc space height loss, endplate irregularity and sclerosis Redemonstrated marked gaseous distention of multiple loops of small bowel. Small amount of pneumoperitoneum is seen within the upper abdomen. Regional soft tissues appear normal. IMPRESSION: 1. Transitional anatomy with spinal labeling as above. 2. Mild to moderate multilevel lumbar spine DDD, worse at L5-L6. Electronically Signed   By: Sandi Mariscal M.D.   On: 07/06/2019 08:01   DG Abd 1 View  Result Date: 07/06/2019 CLINICAL DATA:  Check gastric catheter placement EXAM: ABDOMEN - 1 VIEW COMPARISON:  Film from earlier in the same day. FINDINGS: Persistent small bowel obstructive change is noted stable from the prior examination. Gastric catheter has been advanced with the tip in the stomach  although the proximal side port lies in the distal esophagus and should be advanced several cm. Increase in the degree of free air is noted beneath the diaphragms. IMPRESSION: Gastric catheter with the tip in the stomach. This should be advanced several cm further into the stomach. Persistent small bowel obstructive change. Increasing pneumoperitoneum. These results will be called to the ordering clinician or representative by the Radiologist Assistant, and communication documented in the PACS or [employer]. Electronically Signed   By: Inez Catalina M.D.   On: 07/06/2019 13:26   DG ABD ACUTE 2+V W 1V CHEST  Result Date: 07/06/2019 CLINICAL DATA:  Small-bowel obstruction and pneumoperitoneum. EXAM: DG ABDOMEN ACUTE W/ 1V  CHEST COMPARISON:  07/05/2019; CT abdomen and pelvis-07/05/2019 FINDINGS: Redemonstrated marked gaseous distention of multiple loops of small bowel with index loop of small bowel within the right mid hemiabdomen measuring 7.6 cm in diameter. This finding is again associated with a conspicuous paucity of distal colonic gas. Small amount of pneumoperitoneum is seen within the upper abdomen, likely reduced in volume. No pneumatosis or portal venous gas Limited visualization of lower thorax demonstrates sequela of previous paraspinal fusion and corpectomy, incompletely evaluated. Degenerative change the lower lumbar spine is suspected though incompletely evaluated. IMPRESSION: 1. Similar findings worrisome for high-grade small bowel obstruction. 2. Small amount of pneumoperitoneum, likely reduced in volume compared to the 07/05/2019 examination. Electronically Signed   By: Sandi Mariscal M.D.   On: 07/06/2019 07:58    Scheduled Meds: . insulin aspart  0-9 Units Subcutaneous Q4H  . levothyroxine  50 mcg Intravenous Daily  . octreotide  100 mcg Subcutaneous Q8H  . pantoprazole (PROTONIX) IV  40 mg Intravenous Q24H   Continuous Infusions: . lactated ringers 75 mL/hr at 07/06/19 0910  . meropenem (MERREM) IV 1 g (07/06/19 [id number])   PRN Meds:   Time spent: 35 minutes  Author: Berle Mull, MD [facility] 07/06/2019 7:52 PM  To reach On-call, see care teams to locate the attending and reach out to them via [email or web address]. If 7PM-7AM, please contact night-coverage If you still have difficulty reaching the attending provider, please page the [facility] (Director on Call) for [facility] on amion for assistance.

## 2019-07-06 NOTE — Evaluation (Signed)
Physical Therapy Evaluation Patient Details Name: Meredith Jones MRN: [id number] DOB: April 25, 1955 Today's Date: 07/06/2019   History of Present Illness  Pt is a 64 y.o. female presenting to hospital 5/22 with weakness and abdominal cramping; of note pt being treated at [facility] center for ovarian CA.  Pt admitted with pneumoperitoneum, DM, hypothyroidism, AKI, and UTI.  PMH includes anemia, DM, and h/o lumbar surgery.  Clinical Impression  Prior to hospital admission, pt was modified independent ambulating with 4ww; lives with her boyfriend in 1 level home with ramp to enter.  Currently pt is SBA semi-supine to sitting edge of bed; CGA with transfers using RW; and CGA to ambulate 3 feet bed to recliner with RW.  Mild increased effort noted to perform each activity.  Pt declining further mobility d/t not having any energy from not eating (pt NPO).  Pt would benefit from skilled PT to address noted impairments and functional limitations (see below for any additional details).  Upon hospital discharge, pt would benefit from HHPT and SBA with functional mobility for safety (pt reports fall this morning trying to get OOB on her own).    Follow Up Recommendations Home health PT;Supervision for mobility/OOB    Equipment Recommendations  3in1 (PT)(pt reports having RW at home already)    Recommendations for Other Services       Precautions / Restrictions Precautions Precautions: Fall Precaution Comments: NPO Restrictions Weight Bearing Restrictions: No      Mobility  Bed Mobility Overal bed mobility: Needs Assistance Bed Mobility: Supine to Sit     Supine to sit: Supervision;HOB elevated     General bed mobility comments: Increased effort and time for pt to perform on own  Transfers Overall transfer level: Needs assistance Equipment used: Rolling walker (2 wheeled) Transfers: Sit to/from Stand Sit to Stand: Min guard         General transfer comment: mild increased effort to stand  from bed  Ambulation/Gait Ambulation/Gait assistance: Min guard Gait Distance (Feet): 3 Feet(bed to recliner) Assistive device: Rolling walker (2 wheeled)   Gait velocity: mildly decreased   General Gait Details: decreased B LE step length  Stairs            Wheelchair Mobility    Modified Rankin (Stroke Patients Only)       Balance Overall balance assessment: Needs assistance Sitting-balance support: No upper extremity supported;Feet supported Sitting balance-Leahy Scale: Good Sitting balance - Comments: steady sitting reaching within BOS   Standing balance support: Single extremity supported Standing balance-Leahy Scale: Poor Standing balance comment: pt requiring at least single UE support for static standing balance                             Pertinent Vitals/Pain Pain Assessment: Faces Faces Pain Scale: Hurts a little bit Pain Location: abdomen Pain Descriptors / Indicators: Tender;Discomfort Pain Intervention(s): Limited activity within patient's tolerance;Monitored during session;Repositioned    Home Living Family/patient expects to be discharged to:: Private residence Living Arrangements: Spouse/significant other(Pt's boyfriend (who works)) Available Help at Discharge: Friend(s)(pt's boyfriend) Type of Home: House Home Access: Ramped entrance     Home Layout: One level Home Equipment: Grab bars - tub/shower;Shower seat;Walker - 4 wheels;Walker - 2 wheels      Prior Function Level of Independence: Independent with assistive device(s)         Comments: Ambulatory with 4ww; no other falls in last 6 months (except the one this morning)  Hand Dominance        Extremity/Trunk Assessment   Upper Extremity Assessment Upper Extremity Assessment: Generalized weakness    Lower Extremity Assessment Lower Extremity Assessment: Generalized weakness    Cervical / Trunk Assessment Cervical / Trunk Assessment: Normal   Communication   Communication: No difficulties  Cognition Arousal/Alertness: Awake/alert Behavior During Therapy: WFL for tasks assessed/performed Overall Cognitive Status: Within Functional Limits for tasks assessed                                        General Comments   Nursing cleared pt for participation in physical therapy.  Pt agreeable to PT session.    Exercises  Mobility   Assessment/Plan    PT Assessment Patient needs continued PT services  PT Problem List Decreased strength;Decreased activity tolerance;Decreased balance;Decreased mobility;Decreased knowledge of use of DME       PT Treatment Interventions DME instruction;Gait training;Functional mobility training;Therapeutic activities;Therapeutic exercise;Balance training;Patient/family education    PT Goals (Current goals can be found in the Care Plan section)  Acute Rehab PT Goals Patient Stated Goal: to go home; to eat PT Goal Formulation: With patient Time For Goal Achievement: 07/20/19 Potential to Achieve Goals: Good    Frequency Min 2X/week   Barriers to discharge        Co-evaluation               AM-PAC PT "6 Clicks" Mobility  Outcome Measure Help needed turning from your back to your side while in a flat bed without using bedrails?: None Help needed moving from lying on your back to sitting on the side of a flat bed without using bedrails?: A Little Help needed moving to and from a bed to a chair (including a wheelchair)?: A Little Help needed standing up from a chair using your arms (e.g., wheelchair or bedside chair)?: A Little Help needed to walk in hospital room?: A Little Help needed climbing 3-5 steps with a railing? : A Little 6 Click Score: 19    End of Session Equipment Utilized During Treatment: Gait belt Activity Tolerance: Patient limited by fatigue(pt reports limited energy d/t NPO status) Patient left: in chair;with call bell/phone within reach;with chair  alarm set;Other (comment)(fall mat on floor) Nurse Communication: Mobility status;Precautions PT Visit Diagnosis: Other abnormalities of gait and mobility (R26.89);Muscle weakness (generalized) (M62.81);History of falling (Z91.81)    Time: [id number] PT Time Calculation (min) (ACUTE ONLY): 30 min   Charges:   PT Evaluation $PT Eval Low Complexity: 1 Low PT Treatments $Therapeutic Activity: 8-22 mins        Leitha Bleak, PT 07/06/19, 10:23 AM

## 2019-07-06 NOTE — TOC Initial Note (Signed)
Transition of Care [facility]) - Initial/Assessment Note    Patient Details  Name: Meredith Jones MRN: [id number] Date of Birth: 09/08/1955  [facility] CM/SW Contact:    Elease Hashimoto, LCSW Phone Number: 07/06/2019, 10:40 AM  Clinical Narrative: Met with pt who is sitting up in her chair. She reports she is feeling a little bit better. She lives with Charlotte Crumb who does work but does check in with her during the day and is home at night. She has no other family since she is an orphan. She has been doing well up until this and is very weak now. She is aware she is NPO and feels weaker due to this. She is aware the MD's are looking to see if her cancer has spread and in other areas of her body. She hopes to be going home from here and misses her cat. Unsure she is aware of her grave condition. Discussed will follow along and provide support, may benefit from chaplain visit. She was working at [employer] but has not been lately due to her condition. She was independent with a rollator prior to admission to the hospital. Work on best plan for pt.                Expected Discharge Plan: [place] Barriers to Discharge: Continued Medical Work up   Patient Goals and CMS Choice Patient states their goals for this hospitalization and ongoing recovery are:: I plan on going home and hopefully it will be soon CMS Medicare.gov Compare Post Acute Care list provided to:: Patient Choice offered to / list presented to : Patient  Expected Discharge Plan and Services Expected Discharge Plan: [place] In-house Referral: Clinical Social Work, Freight forwarder Acute Care Choice: Home Health, Durable Medical Equipment Living arrangements for the past 2 months: [place]                                      Prior Living Arrangements/Services Living arrangements for the past 2 months: Single Family Home Lives with:: Significant  Other(Johnny) Patient language and need for interpreter reviewed:: No Do you feel safe going back to the place where you live?: Yes      Need for Family Participation in Patient Care: Yes (Comment) Care giver support system in place?: No (comment)(Not 24 hr) Current home services: DME(has a rollator at home already) Criminal Activity/Legal Involvement Pertinent to Current Situation/Hospitalization: No - Comment as needed  Activities of Daily Living Home Assistive Devices/Equipment: CBG Meter, Brace (specify type), Cane (specify quad or straight), Walker (specify type) ADL Screening (condition at time of admission) Patient's cognitive ability adequate to safely complete daily activities?: Yes Is the patient deaf or have difficulty hearing?: No Does the patient have difficulty seeing, even when wearing glasses/contacts?: No Does the patient have difficulty concentrating, remembering, or making decisions?: No Patient able to express need for assistance with ADLs?: Yes Does the patient have difficulty dressing or bathing?: No Independently performs ADLs?: Yes (appropriate for developmental age) Does the patient have difficulty walking or climbing stairs?: Yes Weakness of Legs: Left Weakness of Arms/Hands: None  Permission Sought/Granted Permission sought to share information with : Facility Sport and exercise psychologist, Family Supports Permission granted to share information with : Yes, Verbal Permission Granted  Share Information with NAME: Charlotte Crumb     Permission granted to share  info w Relationship: significant other     Emotional Assessment Appearance:: Appears older than stated age Attitude/Demeanor/Rapport: Gracious Affect (typically observed): Accepting, Apprehensive Orientation: : Oriented to Self, Oriented to Place, Oriented to Situation      Admission diagnosis:  Weakness [R53.1] Distended abdomen [R14.0] AKI (acute kidney injury) ([place]) [N17.9] Ascites [R18.8] Patient Active  Problem List   Diagnosis Date Noted  . Weakness 07/04/2019  . AKI (acute kidney injury) ([place]) 07/04/2019  . Abdominal pain 07/04/2019  . Bilateral primary ovarian cancer ([place]) 02/02/2019  . Genetic testing 12/09/2018  . Status post hysterectomy 11/26/2018  . Need for prophylactic vaccination and inoculation against influenza 11/04/2018  . Anemia of chronic disease 10/22/2018  . B12 deficiency 10/22/2018  . Family history of lung cancer   . Serous adenocarcinoma ([place]) 08/13/2018  . Goals of care, counseling/discussion 08/11/2018  . Primary high grade serous adenocarcinoma of ovary ([place]) 08/11/2018  . Loss of weight   . Benign neoplasm of descending colon   . Type 2 diabetes mellitus without complication ([place]) 30/10/7947  . 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 Cyst of ovary 06/09/2014  . Bulky or enlarged uterus 06/09/2014  . Hypercholesteremia 02/09/2007  . Allergic rhinitis 11/27/2006  . Endometriosis 11/27/2006  . Adult hypothyroidism 11/27/2006   PCP:  Mar Daring, PA-C Pharmacy:   [place], [place] - [address] [place] [address] Phone: [phone] Fax: [phone]     Social Determinants of Health (SDOH) Interventions    Readmission Risk Interventions No flowsheet data found.

## 2019-07-06 NOTE — Progress Notes (Signed)
CC: carcinomatosis  Subjective: Became delirious overnight.  She states that she is hungry but is not competent currently.  She is alert and she does not complain of abdominal pain.  Over she was asking to go to a different room this morning and was clearly delirious.  No emesis.  KUB personally reviewed there is actually decreasing pneumoperitoneum but now increased in small bowel dilation.  I am not sure of the significance of these findings Creatinine actually improved this morning, does have hypokalemia and  Objective: Vital signs in last 24 hours: Temp:  [97.4 F (36.3 C)-98.6 F (37 C)] 97.4 F (36.3 C) (05/24 0719) Pulse Rate:  [81-96] 85 (05/24 0719) Resp:  [13-18] 17 (05/24 0719) BP: (95-118)/(50-74) 103/67 (05/24 0719) SpO2:  [97 %-100 %] 99 % (05/24 0719) Weight:  [53.2 kg] 53.2 kg (05/23 1057) Last BM Date: 07/05/19  Intake/Output from previous day: 05/23 0701 - 05/24 0700 In: 1933.4 [I.V.:1833.4; IV Piggyback:100] Out: -  Intake/Output this shift: No intake/output data recorded.  Physical exam: Cachectic and very debilitated.  Very fragile Abd: soft, distended mild discomfort, no peritonitis. Ext: multiple bruises from capillary fragiliy  Lab Results: CBC  Recent Labs    07/05/19 0717 07/06/19 0623  WBC 9.7 6.5  HGB 9.4* 10.0*  HCT 27.4* 30.4*  PLT 165 152   BMET Recent Labs    07/05/19 0717 07/06/19 0754  NA 134* 135  K 3.0* 2.7*  CL 101 102  CO2 17* 23  GLUCOSE 127* 113*  BUN 78* 49*  CREATININE 1.54* 0.96  CALCIUM 7.8* 7.8*   PT/INR No results for input(s): LABPROT, INR in the last 72 hours. ABG No results for input(s): PHART, HCO3 in the last 72 hours.  Invalid input(s): PCO2, PO2  Studies/Results: DG Lumbar Spine 2-3 Views  Result Date: 07/06/2019 CLINICAL DATA:  Back pain EXAM: LUMBAR SPINE - 2-3 VIEW COMPARISON:  Abdominal radiographs-earlier same day; CT abdomen and pelvis-07/05/2019 FINDINGS: There are 6 non rib-bearing lumbar  type vertebral bodies. For the purposes of this dictation, lumbar levels will be labeled L1 through L6. Grade 1 anterolisthesis of L4 upon L5 measuring approximately 3 mm. No definite pars defects Lumbar vertebral body heights appear preserved Mild to moderate multilevel lumbar spine DDD, worse at L5-L6 with disc space height loss, endplate irregularity and sclerosis Redemonstrated marked gaseous distention of multiple loops of small bowel. Small amount of pneumoperitoneum is seen within the upper abdomen. Regional soft tissues appear normal. IMPRESSION: 1. Transitional anatomy with spinal labeling as above. 2. Mild to moderate multilevel lumbar spine DDD, worse at L5-L6. Electronically Signed   By: Sandi Mariscal M.D.   On: 07/06/2019 08:01   CT ABDOMEN PELVIS W CONTRAST  Result Date: 07/05/2019 CLINICAL DATA:  History of ovarian cancer, free air in the abdomen on prior radiographs EXAM: CT ABDOMEN AND PELVIS WITH CONTRAST TECHNIQUE: Multidetector CT imaging of the abdomen and pelvis was performed using the standard protocol following bolus administration of intravenous contrast. CONTRAST:  80mL OMNIPAQUE IOHEXOL 300 MG/ML  SOLN COMPARISON:  Same-day abdominal radiographs, CT abdomen pelvis, 06/01/2019 FINDINGS: Lower chest: No acute abnormality. Hepatobiliary: No solid liver abnormality is seen. No gallstones, gallbladder wall thickening, or biliary dilatation. Pancreas: Unremarkable. No pancreatic ductal dilatation or surrounding inflammatory changes. Spleen: Normal in size without significant abnormality. Adrenals/Urinary Tract: Adrenal glands are unremarkable. Kidneys are normal, without renal calculi, solid lesion, or hydronephrosis. Bladder is unremarkable. Stomach/Bowel: Stomach is within normal limits. The small bowel is diffusely fluid-filled, somewhat  thickened and hyperenhancing although not overtly distended. The colon is generally decompressed with scattered gas present to the rectum. Sigmoid  diverticulosis. Vascular/Lymphatic: Aortic atherosclerosis. No enlarged abdominal or pelvic lymph nodes. Reproductive: Status post hysterectomy and oophorectomy. Other: No abdominal wall hernia or abnormality. Anasarca. Small volume pneumoperitoneum. Small volume ascites throughout the abdomen and pelvis. There are minimal areas of peritoneal thickening and nodularity, example in the right paracolic gutter, consistent with peritoneal metastatic disease. Musculoskeletal: No acute or significant osseous findings. IMPRESSION: 1. Small volume pneumoperitoneum, in keeping with findings of prior radiographs. 2. The small bowel is diffusely fluid-filled, somewhat thickened and hyperenhancing, although not overtly distended. There is no pneumatosis, portal venous gas, or other specific findings to localize source of pneumoperitoneum. 3. 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 4.  Diverticulosis without evidence of acute diverticulitis. 5.  Aortic Atherosclerosis (ICD10-I70.0). Findings discussed by telephone with Dr. Dahlia Byes at the time of interpretation. Electronically Signed   By: Eddie Candle M.D.   On: 07/05/2019 17:32   US RENAL  Result Date: 07/05/2019 CLINICAL DATA:  Acute renal injury, history of ovarian carcinoma EXAM: RENAL / URINARY TRACT ULTRASOUND COMPLETE COMPARISON:  None. FINDINGS: Right Kidney: Renal measurements: 9.9 x 3.8 x 5.2 cm. = volume: 104 mL. Mild increased echogenicity is noted. No mass lesion or hydronephrosis is seen. Left Kidney: Renal measurements: 9.6 x 4.9 x 5.1 cm. = volume: 128 mL. Increased echogenicity is noted. No mass lesion or hydronephrosis is noted. Bladder: Partially decompressed Other: None. IMPRESSION: Mild increased echogenicity consistent with medical renal disease. No obstructive changes are noted. Electronically Signed   By: Inez Catalina M.D.   On:  07/05/2019 13:11   US Abdomen Limited  Result Date: 07/05/2019 CLINICAL DATA:  Abdominal distension, possible ascites EXAM: LIMITED ABDOMEN ULTRASOUND FOR ASCITES TECHNIQUE: Limited ultrasound survey for ascites was performed in all four abdominal quadrants. COMPARISON:  None. FINDINGS: Minimal ascites is noted. Previously seen plain film showed significant small bowel dilatation. IMPRESSION: Minimal ascites. This is not the etiology of the patient's abdominal distension. CT of the abdomen and pelvis is again recommended for further evaluation. Electronically Signed   By: Inez Catalina M.D.   On: 07/05/2019 13:12   DG ABD ACUTE 2+V W 1V CHEST  Result Date: 07/06/2019 CLINICAL DATA:  Small-bowel obstruction and pneumoperitoneum. EXAM: DG ABDOMEN ACUTE W/ 1V CHEST COMPARISON:  07/05/2019; CT abdomen and pelvis-07/05/2019 FINDINGS: Redemonstrated marked gaseous distention of multiple loops of small bowel with index loop of small bowel within the right mid hemiabdomen measuring 7.6 cm in diameter. This finding is again associated with a conspicuous paucity of distal colonic gas. Small amount of pneumoperitoneum is seen within the upper abdomen, likely reduced in volume. No pneumatosis or portal venous gas Limited visualization of lower thorax demonstrates sequela of previous paraspinal fusion and corpectomy, incompletely evaluated. Degenerative change the lower lumbar spine is suspected though incompletely evaluated. IMPRESSION: 1. Similar findings worrisome for high-grade small bowel obstruction. 2. Small amount of pneumoperitoneum, likely reduced in volume compared to the 07/05/2019 examination. Electronically Signed   By: Sandi Mariscal M.D.   On: 07/06/2019 07:58   DG Abd Portable 1V  Result Date: 07/05/2019 CLINICAL DATA:  Abdominal distension EXAM: PORTABLE ABDOMEN - 1 VIEW COMPARISON:  06/01/2019 FINDINGS: Scattered large and small bowel gas is noted. Significant small bowel dilatation is noted to 7 cm.  There are changes suspicious for free  intraperitoneal air with regular sign and right upper quadrant air identified. CT of the abdomen and pelvis is recommended for further evaluation. No bony abnormality is seen. IMPRESSION: Significant dilatation of the small bowel with evidence of free air within the abdomen. CT of the abdomen and pelvis is recommended for further evaluation. Electronically Signed   By: Inez Catalina M.D.   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 Pneumoperitoneum of unknown etiology.  Now the patient is delirious and has deteriorated.  She is afebrile she is not per genetic.  Very difficult situation with no good outcomes.  Her overall prognosis is very poor.  On one hand she has deteriorated control quite drastically but I am not sure that I will be able to help her and change her overall outcome.  Gynecology oncology at [facility] has been in touch with oncology and they can inquire about the poor prognosis.  Due to general surgery is in agreement with me. Personally do not think that she will survive this hospitalization regardless of the therapy that we choose to do.  The most challenging issue now is that she is delirious and is not competent. Personally I do think that a surgical intervention is futile at this time. I have d/w Dr. Janese Banks about my thought process. She will reassess and revisit code status. I have spent > 35  minutes in this encounter w > 50% spent in coordination and counseling of her care.   Caroleen Hamman, MD, [facility]  07/06/2019

## 2019-07-06 NOTE — Progress Notes (Signed)
Hematology/Oncology Consult note [facility]  [phone] [phone]  Patient Care Team: Rubye Beach as PCP - General (Family Medicine) Clent Jacks, RN as Oncology Nurse Navigator   Name of the [name]  [id number]  11-03-1955   Date of visit: 07/06/2019   Interval history- sitting up on a chair. Reports lower abdominal pain bt says better than yesterday. Denies any nausea/ vomiting. Not sure if she is passing gas. Says she had a BM very early this morning  Pain scale- 4  Review of systems- Review of Systems  Constitutional: Positive for malaise/fatigue. Negative for chills, fever and weight loss.  HENT: Negative for congestion, ear discharge and nosebleeds.   Eyes: Negative for blurred vision.  Respiratory: Negative for cough, hemoptysis, sputum production, shortness of breath and wheezing.   Cardiovascular: Negative for chest pain, palpitations, orthopnea and claudication.  Gastrointestinal: Negative for abdominal pain, blood in stool, constipation, diarrhea, heartburn, melena, nausea and vomiting.       Abdominal distension  Genitourinary: Negative for dysuria, flank pain, frequency, hematuria and urgency.  Musculoskeletal: Negative for back pain, joint pain and myalgias.  Skin: Negative for rash.  Neurological: Negative for dizziness, tingling, focal weakness, seizures, weakness and headaches.  Endo/Heme/Allergies: Does not bruise/bleed easily.  Psychiatric/Behavioral: Negative for depression and suicidal ideas. The patient does not have insomnia.       Allergies  Allergen Reactions  . Clarithromycin Nausea Only  . Doxycycline Nausea Only    dizziness  . Penicillins Hives    Did it involve swelling of the face/tongue/throat, SOB, or low BP? No Did it involve sudden or severe rash/hives, skin peeling, or any reaction on the inside of your mouth or nose? No Did you need to seek medical  attention at a hospital or doctor's office? No When did it last happen?50+ years ago If all above answers are "NO", may proceed with cephalosporin use.      Past Medical History:  Diagnosis Date  . Allergy   . Anemia   . Complication of anesthesia   . Diabetes mellitus without complication ([place])   . Diverticulosis   . Family history of adverse reaction to anesthesia    mom-n/v  . Family history of lung cancer   . GERD (gastroesophageal reflux disease)    h/o  . Hyperlipidemia   . Hypothyroidism   . Ovarian cancer ([place]) 08/04/2018   Chemo tx's and Hysterectomy.  . Personal history of chemotherapy   . PONV (postoperative nausea and vomiting)    sick with tonsillectomy  . 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 Thyroid disease      Past Surgical History:  Procedure Laterality Date  . ABDOMINAL HYSTERECTOMY    . BACK SURGERY  2007   Lumbar Surgery  . BREAST CYST ASPIRATION Right    Around 10 years ago. Pt thinks it was the right breast but not sure.  Marland Kitchen CATARACT EXTRACTION Left 2010   Right Eye 2011  . COLONOSCOPY WITH PROPOFOL N/A 09/02/2015   Procedure: COLONOSCOPY WITH PROPOFOL;  Surgeon: [name], MD;  Location: [place];  Service: Endoscopy;  Laterality: N/A;  Diabetic - oral meds  . ESOPHAGOGASTRODUODENOSCOPY (EGD) WITH PROPOFOL N/A 11/03/2015   Procedure: ESOPHAGOGASTRODUODENOSCOPY (EGD) WITH PROPOFOL;  Surgeon: [name], MD;  Location: [place];  Service: Endoscopy;  Laterality: N/A;  . EYE SURGERY Bilateral 06/2014    tear duck catherization ( Dr. Vallarie Mare)  .  LAPAROSCOPIC APPENDECTOMY  11/26/2018   Procedure: APPENDECTOMY LAPAROSCOPIC;  Surgeon: Ward, Honor Loh, MD;  Location: [facility];  Service: Gynecology;;  . POLYPECTOMY  09/02/2015   Procedure: POLYPECTOMY;  Surgeon: [name], MD;  Location: [place];  Service: Endoscopy;;  . TONSILLECTOMY  1963    Social History   Socioeconomic History  . Marital  status: Single    Spouse name: Not on file  . Number of children: Not on file  . Years of education: Not on file  . Highest education level: Not on file  Occupational History  . Not on file  Tobacco Use  . Smoking status: Never Smoker  . Smokeless tobacco: Never Used  Substance and Sexual Activity  . Alcohol use: No  . Drug use: No  . Sexual activity: Not Currently  Other Topics Concern  . Not on file  Social History Narrative  . Not on file   Social Determinants of Health   Financial Resource Strain:   . Difficulty of Paying Living Expenses:   Food Insecurity:   . Worried About Charity fundraiser in the Last Year:   . Arboriculturist in the Last Year:   Transportation Needs:   . Film/video editor (Medical):   Marland Kitchen Lack of Transportation (Non-Medical):   Physical Activity:   . Days of Exercise per Week:   . Minutes of Exercise per Session:   Stress:   . Feeling of Stress :   Social Connections:   . Frequency of Communication with Friends and Family:   . Frequency of Social Gatherings with Friends and Family:   . Attends Religious Services:   . Active Member of Clubs or Organizations:   . Attends Archivist Meetings:   Marland Kitchen Marital Status:   Intimate Partner Violence:   . Fear of Current or Ex-Partner:   . Emotionally Abused:   Marland Kitchen Physically Abused:   . Sexually Abused:     Family History  Problem Relation Age of Onset  . Congestive Heart Failure Mother   . Diabetes Mother   . Stroke Mother   . Hypothyroidism Father   . Cancer Father        lung cancer  . Diabetes Father   . Cancer Paternal Grandmother        stomach  . Breast cancer Neg Hx      Current Facility-Administered Medications:  .  insulin aspart (novoLOG) injection 0-9 Units, 0-9 Units, Subcutaneous, Q4H, Lavina Hamman, MD, 2 Units at 07/06/19 0458 .  lactated ringers infusion, , Intravenous, Continuous, Lavina Hamman, MD, Last Rate: 75 mL/hr at 07/06/19 0910, New Bag at 07/06/19  0910 .  levothyroxine (SYNTHROID, LEVOTHROID) injection 50 mcg, 50 mcg, Intravenous, Daily, Lavina Hamman, MD, 50 mcg at 07/06/19 0912 .  meropenem (MERREM) 1 g in sodium chloride 0.9 % 100 mL IVPB, 1 g, Intravenous, Q12H, Lavina Hamman, MD, Last Rate: 200 mL/hr at 07/06/19 0923, 1 g at 07/06/19 0923 .  octreotide (SANDOSTATIN) injection 100 mcg, 100 mcg, Subcutaneous, Q8H, Sindy Guadeloupe, MD, 100 mcg at 07/06/19 1108 .  pantoprazole (PROTONIX) injection 40 mg, 40 mg, Intravenous, Q24H, Lavina Hamman, MD, 40 mg at 07/05/19 1637 .  potassium chloride 10 mEq in 100 mL IVPB, 10 mEq, Intravenous, Q1 Hr x 6, Lavina Hamman, MD, Last Rate: 100 mL/hr at 07/06/19 1159, 10 mEq at 07/06/19 1159  Facility-Administered Medications Ordered in Other Encounters:  .  sodium chloride  flush (NS) 0.9 % injection 10 mL, 10 mL, Intravenous, Once, Sindy Guadeloupe, MD .  sodium chloride flush (NS) 0.9 % injection 10 mL, 10 mL, Intravenous, Once, Sindy Guadeloupe, MD .  sodium chloride flush (NS) 0.9 % injection 10 mL, 10 mL, Intravenous, Once, Sindy Guadeloupe, MD  Physical exam:  Vitals:   07/06/19 0245 07/06/19 0546 07/06/19 0719 07/06/19 1203  BP: 117/74 118/67 103/67 105/65  Pulse: 81 85 85 89  Resp: 18 18 17 17   Temp: 98.6 F (37 C) 98 F (36.7 C) (!) 97.4 F (36.3 C) 97.8 F (36.6 C)  TempSrc:   Oral   SpO2: 100% 98% 99% 97%  Weight:      Height:       Physical Exam Constitutional:      Comments: She is thin and cachectic. Appears in mild distress  HENT:     Head: Normocephalic and atraumatic.  Eyes:     Pupils: Pupils are equal, round, and reactive to light.  Cardiovascular:     Rate and Rhythm: Normal rate and regular rhythm.     Heart sounds: Normal heart sounds.  Pulmonary:     Effort: Pulmonary effort is normal.     Breath sounds: Normal breath sounds.  Abdominal:     Comments: Distended. Mild diffuse TTP  Musculoskeletal:     Cervical back: Normal range of motion.  Skin:     General: Skin is warm and dry.  Neurological:     Mental Status: She is alert and oriented to person, place, and time.      CMP Latest Ref Rng & Units 07/06/2019  Glucose 70 - 99 mg/dL 113(H)  BUN 8 - 23 mg/dL 49(H)  Creatinine 0.44 - 1.00 mg/dL 0.96  Sodium 135 - 145 mmol/L 135  Potassium 3.5 - 5.1 mmol/L 2.7(LL)  Chloride 98 - 111 mmol/L 102  CO2 22 - 32 mmol/L 23  Calcium 8.9 - 10.3 mg/dL 7.8(L)  Total Protein 6.5 - 8.1 g/dL -  Total Bilirubin 0.3 - 1.2 mg/dL -  Alkaline Phos 38 - 126 U/L -  AST 15 - 41 U/L -  ALT 0 - 44 U/L -   CBC Latest Ref Rng & Units 07/06/2019  WBC 4.0 - 10.5 K/uL 6.5  Hemoglobin 12.0 - 15.0 g/dL 10.0(L)  Hematocrit 36.0 - 46.0 % 30.4(L)  Platelets 150 - 400 K/uL 152    @IMAGES @  DG Lumbar Spine 2-3 Views  Result Date: 07/06/2019 CLINICAL DATA:  Back pain EXAM: LUMBAR SPINE - 2-3 VIEW COMPARISON:  Abdominal radiographs-earlier same day; CT abdomen and pelvis-07/05/2019 FINDINGS: There are 6 non rib-bearing lumbar type vertebral bodies. For the purposes of this dictation, lumbar levels will be labeled L1 through L6. Grade 1 anterolisthesis of L4 upon L5 measuring approximately 3 mm. No definite pars defects Lumbar vertebral body heights appear preserved Mild to moderate multilevel lumbar spine DDD, worse at L5-L6 with disc space height loss, endplate irregularity and sclerosis Redemonstrated marked gaseous distention of multiple loops of small bowel. Small amount of pneumoperitoneum is seen within the upper abdomen. Regional soft tissues appear normal. IMPRESSION: 1. Transitional anatomy with spinal labeling as above. 2. Mild to moderate multilevel lumbar spine DDD, worse at L5-L6. Electronically Signed   By: Sandi Mariscal M.D.   On: 07/06/2019 08:01   CT ABDOMEN PELVIS W CONTRAST  Result Date: 07/05/2019 CLINICAL DATA:  History of ovarian cancer, free air in the abdomen on prior radiographs EXAM: CT ABDOMEN  AND PELVIS WITH CONTRAST TECHNIQUE: Multidetector CT  imaging of the abdomen and pelvis was performed using the standard protocol following bolus administration of intravenous contrast. CONTRAST:  9mL OMNIPAQUE IOHEXOL 300 MG/ML  SOLN COMPARISON:  Same-day abdominal radiographs, CT abdomen pelvis, 06/01/2019 FINDINGS: Lower chest: No acute abnormality. Hepatobiliary: No solid liver abnormality is seen. No gallstones, gallbladder wall thickening, or biliary dilatation. Pancreas: Unremarkable. No pancreatic ductal dilatation or surrounding inflammatory changes. Spleen: Normal in size without significant abnormality. Adrenals/Urinary Tract: Adrenal glands are unremarkable. Kidneys are normal, without renal calculi, solid lesion, or hydronephrosis. Bladder is unremarkable. Stomach/Bowel: Stomach is within normal limits. The small bowel is diffusely fluid-filled, somewhat thickened and hyperenhancing although not overtly distended. The colon is generally decompressed with scattered gas present to the rectum. Sigmoid diverticulosis. Vascular/Lymphatic: Aortic atherosclerosis. No enlarged abdominal or pelvic lymph nodes. Reproductive: Status post hysterectomy and oophorectomy. Other: No abdominal wall hernia or abnormality. Anasarca. Small volume pneumoperitoneum. Small volume ascites throughout the abdomen and pelvis. There are minimal areas of peritoneal thickening and nodularity, example in the right paracolic gutter, consistent with peritoneal metastatic disease. Musculoskeletal: No acute or significant osseous findings. IMPRESSION: 1. Small volume pneumoperitoneum, in keeping with findings of prior radiographs. 2. The small bowel is diffusely fluid-filled, somewhat thickened and hyperenhancing, although not overtly distended. There is no pneumatosis, portal venous gas, or other specific findings to localize source of pneumoperitoneum. 3. 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 4.  Diverticulosis without evidence of acute diverticulitis. 5.  Aortic Atherosclerosis (ICD10-I70.0). Findings discussed by telephone with Dr. Dahlia Byes at the time of interpretation. Electronically Signed   By: Eddie Candle M.D.   On: 07/05/2019 17:32   US RENAL  Result Date: 07/05/2019 CLINICAL DATA:  Acute renal injury, history of ovarian carcinoma EXAM: RENAL / URINARY TRACT ULTRASOUND COMPLETE COMPARISON:  None. FINDINGS: Right Kidney: Renal measurements: 9.9 x 3.8 x 5.2 cm. = volume: 104 mL. Mild increased echogenicity is noted. No mass lesion or hydronephrosis is seen. Left Kidney: Renal measurements: 9.6 x 4.9 x 5.1 cm. = volume: 128 mL. Increased echogenicity is noted. No mass lesion or hydronephrosis is noted. Bladder: Partially decompressed Other: None. IMPRESSION: Mild increased echogenicity consistent with medical renal disease. No obstructive changes are noted. Electronically Signed   By: Inez Catalina M.D.   On: 07/05/2019 13:11   US Abdomen Limited  Result Date: 07/05/2019 CLINICAL DATA:  Abdominal distension, possible ascites EXAM: LIMITED ABDOMEN ULTRASOUND FOR ASCITES TECHNIQUE: Limited ultrasound survey for ascites was performed in all four abdominal quadrants. COMPARISON:  None. FINDINGS: Minimal ascites is noted. Previously seen plain film showed significant small bowel dilatation. IMPRESSION: Minimal ascites. This is not the etiology of the patient's abdominal distension. CT of the abdomen and pelvis is again recommended for further evaluation. Electronically Signed   By: Inez Catalina M.D.   On: 07/05/2019 13:12   US Paracentesis  Result Date: 06/30/2019 INDICATION: Ovarian cancer with ascites. Request for diagnostic and therapeutic paracentesis. EXAM: ULTRASOUND GUIDED PARACENTESIS MEDICATIONS: 1% lidocaine 10 mL COMPLICATIONS: None immediate. PROCEDURE: Informed written consent was obtained from the patient after a discussion of the risks, benefits and  alternatives to treatment. A timeout was performed prior to the initiation of the procedure. Initial ultrasound scanning demonstrates a moderate amount of ascites within the right lower abdominal quadrant. The right lower abdomen was prepped and draped in the usual sterile fashion. 1% lidocaine was  used for local anesthesia. Following this, a 6 Fr Safe-T-Centesis catheter was introduced. An ultrasound image was saved for documentation purposes. The paracentesis was performed. The catheter was removed and a dressing was applied. The patient tolerated the procedure well without immediate post procedural complication. FINDINGS: A total of approximately 1 L of clear yellow fluid was removed. Samples were sent to the laboratory as requested by the clinical team. IMPRESSION: Successful ultrasound-guided paracentesis yielding 1 liter of peritoneal fluid. Read by: Gareth Eagle, PA-C Electronically Signed   By: Marcello Moores  Register   On: 06/30/2019 11:21   DG ABD ACUTE 2+V W 1V CHEST  Result Date: 07/06/2019 CLINICAL DATA:  Small-bowel obstruction and pneumoperitoneum. EXAM: DG ABDOMEN ACUTE W/ 1V CHEST COMPARISON:  07/05/2019; CT abdomen and pelvis-07/05/2019 FINDINGS: Redemonstrated marked gaseous distention of multiple loops of small bowel with index loop of small bowel within the right mid hemiabdomen measuring 7.6 cm in diameter. This finding is again associated with a conspicuous paucity of distal colonic gas. Small amount of pneumoperitoneum is seen within the upper abdomen, likely reduced in volume. No pneumatosis or portal venous gas Limited visualization of lower thorax demonstrates sequela of previous paraspinal fusion and corpectomy, incompletely evaluated. Degenerative change the lower lumbar spine is suspected though incompletely evaluated. IMPRESSION: 1. Similar findings worrisome for high-grade small bowel obstruction. 2. Small amount of pneumoperitoneum, likely reduced in volume compared to the 07/05/2019  examination. Electronically Signed   By: Sandi Mariscal M.D.   On: 07/06/2019 07:58   DG Abd Portable 1V  Result Date: 07/05/2019 CLINICAL DATA:  Abdominal distension EXAM: PORTABLE ABDOMEN - 1 VIEW COMPARISON:  06/01/2019 FINDINGS: Scattered large and small bowel gas is noted. Significant small bowel dilatation is noted to 7 cm. There are changes suspicious for free intraperitoneal air with regular sign and right upper quadrant air identified. CT of the abdomen and pelvis is recommended for further evaluation. No bony abnormality is seen. IMPRESSION: Significant dilatation of the small bowel with evidence of free air within the abdomen. CT of the abdomen and pelvis is recommended for further evaluation. Electronically Signed   By: Inez Catalina M.D.   On: 07/05/2019 09:16     Assessment and plan- Patient is a 64 y.o. 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  Dr. Rogue Bussing was on call yesterday and has have spoken to [facility] surgery as well as Dr. Dahlia Byes and medicine team here.  Plan is for conservative management of the pneumoperitoneum which appears to be improving on x-ray today.  However repeat x-ray today shows high-grade small bowel obstruction with distended small bowel loops that measure up to 7.5 cm.  Paucity of distal colonic gas.  I also discussed patient's case with GYN oncology Dr. Theora Gianotti today.  Currently there is no plan for any surgical intervention.  It is unclear if this is a transient small bowel obstruction from peritoneal carcinomatosis versus an actual anatomical high-grade obstruction.  If it is the latter then it is not going to improve with conservative measures and she is not currently surgical candidate.  It would be reasonable to insert NG tube to low wall suction for proximal decompression of the small bowel and  continue octreotide 100 mcg 3 times daily.  She is also on empiric meropenem.  Surprisingly patient does not have significant nausea or vomiting.  I did give her 1 dose of Decadron  10 mg IV but I will hold off on further doses.  Repeat abdominal x-ray tomorrow.  If patient's bowel obstruction resolves with conservative measures, we can reassess her status as an outpatient to discuss further goals of care treatment versus hospice.  However if patient's bowel obstruction does not improve over the next 2 to 3 days despite conservative measures, home hospice versus hospice home would be the way to go.  Minimize opioids if possible to prevent slowing of gut mobility but it would also be important to address her pain in the palliative setting.  2.  I discussed patient's CT scan findings and explained the ongoing situation to her.  Patient was noted to have some episode of delirium overnight but this morning she was alert and able to understand what is going on with her.  She understands the above plan well and verbalized it to me.  We also discussed her CODE STATUS and patient understands that her overall prognosis is poor especially in the setting of malignant bowel obstruction and she agrees to be a DNR/DNI.  Patient lives with her significant other Charlotte Crumb for many years.  He is her healthcare power of attorney but patient currently has decision-making capacity.  NP Altha Harm from palliative care has also spoken to [place] and explained the situation in detail   Total face to face encounter time for this patient visit was 40 min out of which >50% sepnt in counseling and coordination of care.  Total non face to face encounter time for this patient on the day of the visit was 10 min. Time spent discussing patients case with Dr. Theora Gianotti Dr. Pabon/ Dr. Posey Pronto      Visit Diagnosis 1. Weakness   2. AKI (acute kidney injury) ([place])   3. Distended abdomen   4. Ascites   5. Pneumoperitoneum   6. Pain   7.  Small bowel obstruction ([place])   8. Encounter for imaging study to confirm nasogastric (NG) tube placement      Dr. Randa Evens, MD, MPH [facility] [id number] 07/06/2019 12:59 PM

## 2019-07-06 NOTE — Consult Note (Signed)
Meredith Jones  [phone] [phone]   Name: Meredith Jones Date: 07/06/2019 MRN: [id number]  DOB: 04-22-1955  Patient Care Team: Mar Daring, PA-C as PCP - General (Family Medicine) Clent Jacks, RN as Oncology Nurse Navigator    REASON FOR CONSULTATION: Meredith Jones is a 64 y.o. 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 She is s/p TAH and BSO on 11/26/2018. She was receiving adjuvant systemic chemotherapy.Patient underwent recent paracentesis with cytology positive for malignancy.  Patient was admitted to the hospital on 07/02/2019 with progressive weakness, decreased oral intake, and worsening abdominal pain.  KUB revealed pneumoperitoneum, which was confirmed on abdominal CT.  Subsequent KUB was concerning for development of high-grade SBO.  Surgery has evaluated and does not feel the patient is a surgical candidate.  She is being managed conservatively.  Palliative care was consulted help address goals.  SOCIAL HISTORY:     reports that she has never smoked. She has never used smokeless tobacco. She reports that she does not drink alcohol or use drugs.   Patient is not married and has no children. Her parents are deceased and she has no siblings. She lives at home with a boyfriend. Patient previously worked as a Scientist, water quality at [employer].  ADVANCE DIRECTIVES:  Does not have  CODE STATUS: DNR  PAST MEDICAL HISTORY: Past Medical History:  Diagnosis Date  . Allergy   . Anemia   . Complication of anesthesia   . Diabetes mellitus without complication ([place])   . Diverticulosis   . Family history of adverse reaction to anesthesia    mom-n/v  . Family history of lung cancer   . GERD (gastroesophageal reflux disease)    h/o  . Hyperlipidemia   . Hypothyroidism   . Ovarian cancer ([place]) 08/04/2018   Chemo tx's and Hysterectomy.    . Personal history of chemotherapy   . PONV (postoperative nausea and vomiting)    sick with tonsillectomy  . 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 Thyroid disease     PAST SURGICAL HISTORY:  Past Surgical History:  Procedure Laterality Date  . ABDOMINAL HYSTERECTOMY    . BACK SURGERY  2007   Lumbar Surgery  . BREAST CYST ASPIRATION Right    Around 10 years ago. Pt thinks it was the right breast but not sure.  Marland Kitchen CATARACT EXTRACTION Left 2010   Right Eye 2011  . COLONOSCOPY WITH PROPOFOL N/A 09/02/2015   Procedure: COLONOSCOPY WITH PROPOFOL;  Surgeon: [name], MD;  Location: [facility] [place];  Service: Endoscopy;  Laterality: N/A;  Diabetic - oral meds  . ESOPHAGOGASTRODUODENOSCOPY (EGD) WITH PROPOFOL N/A 11/03/2015   Procedure: ESOPHAGOGASTRODUODENOSCOPY (EGD) WITH PROPOFOL;  Surgeon: [name], MD;  Location: [facility];  Service: Endoscopy;  Laterality: N/A;  . EYE SURGERY Bilateral 06/2014    tear duck catherization ( Dr. Vallarie Mare)  . LAPAROSCOPIC APPENDECTOMY  11/26/2018   Procedure: APPENDECTOMY LAPAROSCOPIC;  Surgeon: Ward, Honor Loh, MD;  Location: [facility];  Service: Gynecology;;  . POLYPECTOMY  09/02/2015   Procedure: POLYPECTOMY;  Surgeon: [name], MD;  Location: [facility];  Service: Endoscopy;;  . TONSILLECTOMY  1963    HEMATOLOGY/ONCOLOGY HISTORY:  Oncology History  Serous adenocarcinoma ([place])  08/13/2018 Initial Diagnosis   Serous adenocarcinoma ([place])   08/14/2018 Cancer Staging   Staging form: Exocrine Pancreas, AJCC 8th Edition - Clinical stage from 08/14/2018:  Stage III (cT3, cN2, cM0) - Signed by Sindy Guadeloupe, MD on 08/17/2018   08/21/2018 -  Chemotherapy   The patient had palonosetron (ALOXI) injection 0.25 mg, 0.25 mg, Intravenous,  Once, 6 of 6 cycles Administration: 0.25 mg (08/21/2018), 0.25 mg (09/11/2018), 0.25 mg (10/02/2018), 0.25 mg (10/23/2018), 0.25 mg (12/22/2018), 0.25 mg (01/12/2019) pegfilgrastim (NEULASTA ONPRO KIT)  injection 6 mg, 6 mg, Subcutaneous, Once, 6 of 6 cycles Administration: 6 mg (08/21/2018), 6 mg (09/11/2018), 6 mg (10/02/2018), 6 mg (10/23/2018), 6 mg (12/22/2018), 6 mg (01/12/2019) bevacizumab (AVASTIN) 800 mg in sodium chloride 0.9 % 100 mL chemo infusion, 15 mg/kg = 800 mg (100 % of original dose 15 mg/kg), Intravenous,  Once, 1 of 1 cycle Dose modification: 15 mg/kg (original dose 15 mg/kg, Cycle 1) Administration: 800 mg (08/21/2018) CARBOplatin (PARAPLATIN) 430 mg in sodium chloride 0.9 % 250 mL chemo infusion, 430 mg (100 % of original dose 429.5 mg), Intravenous,  Once, 6 of 6 cycles Dose modification:   (original dose 429.5 mg, Cycle 1) Administration: 430 mg (08/21/2018), 430 mg (09/11/2018), 430 mg (10/02/2018), 430 mg (10/23/2018), 390 mg (12/22/2018), 390 mg (01/12/2019) PACLitaxel (TAXOL) 270 mg in sodium chloride 0.9 % 250 mL chemo infusion (> 49m/m2), 175 mg/m2 = 270 mg, Intravenous,  Once, 6 of 6 cycles Dose modification: 160 mg/m2 (original dose 175 mg/m2, Cycle 5, Reason: Other (see comments), Comment: neuropathy) Administration: 270 mg (08/21/2018), 270 mg (09/11/2018), 270 mg (10/02/2018), 270 mg (10/23/2018), 246 mg (12/22/2018), 246 mg (01/12/2019) fosaprepitant (EMEND) 150 mg, dexamethasone (DECADRON) 12 mg in sodium chloride 0.9 % 145 mL IVPB, , Intravenous,  Once, 6 of 6 cycles Administration:  (08/21/2018),  (09/11/2018),  (10/02/2018),  (10/23/2018),  (12/22/2018),  (01/12/2019) bevacizumab-awwb (MVASI) 800 mg in sodium chloride 0.9 % 100 mL chemo infusion, 15 mg/kg = 800 mg (100 % of original dose 15 mg/kg), Intravenous,  Once, 10 of 11 cycles Dose modification: 15 mg/kg (original dose 15 mg/kg, Cycle 2) Administration: 800 mg (09/11/2018), 800 mg (10/02/2018), 800 mg (01/12/2019), 800 mg (02/02/2019), 800 mg (02/23/2019), 800 mg (03/16/2019), 800 mg (04/06/2019), 800 mg (04/27/2019), 800 mg (05/18/2019), 800 mg (06/08/2019)  for chemotherapy treatment.    11/07/2018 Genetic Testing   Negative  germline + somatic genetic testing. No pathogenic variants identified on the [facility] and [employer] HRD. The [facility] panel offered by [employer] sequencing and deletion/duplication testing of the following 35 genes: APC, ATM, AXIN2, BARD1, BMPR1A, BRCA1, BRCA2, BRIP1, CHD1, CDK4, CDKN2A, CHEK2, EPCAM (large rearrangement only), HOXB13, GALNT12, MLH1, MSH2, MSH3, MSH6, MUTYH, NBN, NTHL1, PALB2, PMS2, PTEN, RAD51C, RAD51D, RNF43, RPS20, SMAD4, STK11, and TP53. Sequencing was performed for select regions of POLE and POLD1, and large rearrangement analysis was performed for select regions of GREM1. The report date is 11/07/2018.    Bilateral primary ovarian cancer ([place]  02/02/2019 Initial Diagnosis   Bilateral primary ovarian cancer ([facility])     ALLERGIES:  is allergic to clarithromycin; doxycycline; and penicillins.  MEDICATIONS:  Current Facility-Administered Medications  Medication Dose Route Frequency Provider Last Rate Last Admin  . insulin aspart (novoLOG) injection 0-9 Units  0-9 Units Subcutaneous Q4H PLavina Hamman MD   2 Units at 07/06/19 0458  . lactated ringers infusion   Intravenous Continuous PLavina Hamman MD 75 mL/hr at 07/06/19 0910 New Bag at 07/06/19 0910  . levothyroxine (SYNTHROID, LEVOTHROID) injection 50 mcg  50 mcg Intravenous Daily PLavina Hamman MD   50 mcg at 07/06/19  0912  . meropenem (MERREM) 1 g in sodium chloride 0.9 % 100 mL IVPB  1 g Intravenous Q12H Lavina Hamman, MD 200 mL/hr at 07/06/19 0923 1 g at 07/06/19 0923  . octreotide (SANDOSTATIN) injection 100 mcg  100 mcg Subcutaneous Q8H Sindy Guadeloupe, MD   100 mcg at 07/06/19 1108  . pantoprazole (PROTONIX) injection 40 mg  40 mg Intravenous Q24H Lavina Hamman, MD   40 mg at 07/05/19 1637  . potassium chloride 10 mEq in 100 mL IVPB  10 mEq Intravenous Q1 Hr x 6 Berle Mull M, MD 100 mL/hr at 07/06/19 1159 10 mEq at 07/06/19 1159   Facility-Administered  Medications Ordered in Other Encounters  Medication Dose Route Frequency Provider Last Rate Last Admin  . sodium chloride flush (NS) 0.9 % injection 10 mL  10 mL Intravenous Once Sindy Guadeloupe, MD      . sodium chloride flush (NS) 0.9 % injection 10 mL  10 mL Intravenous Once Sindy Guadeloupe, MD      . sodium chloride flush (NS) 0.9 % injection 10 mL  10 mL Intravenous Once Sindy Guadeloupe, MD        VITAL SIGNS: BP 105/65 (BP Location: Left Arm)   Pulse 89   Temp 97.8 F (36.6 C)   Resp 17   Ht _0  (1.626 m)   Wt 117 lb 4.6 oz (53.2 kg)   SpO2 97%   BMI 20.13 kg/m  Filed Weights   07/04/19 1628 07/05/19 1057  Weight: 119 lb 8 oz (54.2 kg) 117 lb 4.6 oz (53.2 kg)    Estimated body mass index is 20.13 kg/m as calculated from the following:   Height as of this encounter: _1  (1.626 m).   Weight as of this encounter: 117 lb 4.6 oz (53.2 kg).  LABS: CBC:    Component Value Date/Time   WBC 6.5 07/06/2019 0623   HGB 10.0 (L) 07/06/2019 0623   HGB 10.2 (L) 05/31/2017 0836   HCT 30.4 (L) 07/06/2019 0623   HCT 31.4 (L) 05/31/2017 0836   PLT 152 07/06/2019 0623   PLT 374 05/31/2017 0836   MCV 103.4 (H) 07/06/2019 0623   MCV 100 (H) 05/31/2017 0836   NEUTROABS 9.8 (H) 07/04/2019 1631   NEUTROABS 3.4 05/31/2017 0836   LYMPHSABS 0.6 (L) 07/04/2019 1631   LYMPHSABS 1.0 05/31/2017 0836   MONOABS 0.4 07/04/2019 1631   EOSABS 0.0 07/04/2019 1631   EOSABS 0.4 05/31/2017 0836   BASOSABS 0.0 07/04/2019 1631   BASOSABS 0.0 05/31/2017 0836   Comprehensive Metabolic Panel:    Component Value Date/Time   NA 135 07/06/2019 0754   NA 142 05/31/2017 0836   K 2.7 (LL) 07/06/2019 0754   CL 102 07/06/2019 0754   CO2 23 07/06/2019 0754   BUN 49 (H) 07/06/2019 0754   BUN 9 05/31/2017 0836   CREATININE 0.96 07/06/2019 0754   CREATININE 0.57 11/13/2016 0933   GLUCOSE 113 (H) 07/06/2019 0754   CALCIUM 7.8 (L) 07/06/2019 0754   AST 25 07/04/2019 1631   ALT 19 07/04/2019 1631    ALKPHOS 88 07/04/2019 1631   BILITOT 1.0 07/04/2019 1631   BILITOT <0.2 05/31/2017 0836   PROT 5.5 (L) 07/04/2019 1631   PROT 6.1 05/31/2017 0836   ALBUMIN 1.9 (L) 07/06/2019 0754   ALBUMIN 3.4 (L) 05/31/2017 0836    RADIOGRAPHIC STUDIES: DG Lumbar Spine 2-3 Views  Result Date: 07/06/2019 CLINICAL DATA:  Back pain EXAM:  LUMBAR SPINE - 2-3 VIEW COMPARISON:  Abdominal radiographs-earlier same day; CT abdomen and pelvis-07/05/2019 FINDINGS: There are 6 non rib-bearing lumbar type vertebral bodies. For the purposes of this dictation, lumbar levels will be labeled L1 through L6. Grade 1 anterolisthesis of L4 upon L5 measuring approximately 3 mm. No definite pars defects Lumbar vertebral body heights appear preserved Mild to moderate multilevel lumbar spine DDD, worse at L5-L6 with disc space height loss, endplate irregularity and sclerosis Redemonstrated marked gaseous distention of multiple loops of small bowel. Small amount of pneumoperitoneum is seen within the upper abdomen. Regional soft tissues appear normal. IMPRESSION: 1. Transitional anatomy with spinal labeling as above. 2. Mild to moderate multilevel lumbar spine DDD, worse at L5-L6. Electronically Signed   By: Sandi Mariscal M.D.   On: 07/06/2019 08:01   CT ABDOMEN PELVIS W CONTRAST  Result Date: 07/05/2019 CLINICAL DATA:  History of ovarian cancer, free air in the abdomen on prior radiographs EXAM: CT ABDOMEN AND PELVIS WITH CONTRAST TECHNIQUE: Multidetector CT imaging of the abdomen and pelvis was performed using the standard protocol following bolus administration of intravenous contrast. CONTRAST:  9m OMNIPAQUE IOHEXOL 300 MG/ML  SOLN COMPARISON:  Same-day abdominal radiographs, CT abdomen pelvis, 06/01/2019 FINDINGS: Lower chest: No acute abnormality. Hepatobiliary: No solid liver abnormality is seen. No gallstones, gallbladder wall thickening, or biliary dilatation. Pancreas: Unremarkable. No pancreatic ductal dilatation or surrounding  inflammatory changes. Spleen: Normal in size without significant abnormality. Adrenals/Urinary Tract: Adrenal glands are unremarkable. Kidneys are normal, without renal calculi, solid lesion, or hydronephrosis. Bladder is unremarkable. Stomach/Bowel: Stomach is within normal limits. The small bowel is diffusely fluid-filled, somewhat thickened and hyperenhancing although not overtly distended. The colon is generally decompressed with scattered gas present to the rectum. Sigmoid diverticulosis. Vascular/Lymphatic: Aortic atherosclerosis. No enlarged abdominal or pelvic lymph nodes. Reproductive: Status post hysterectomy and oophorectomy. Other: No abdominal wall hernia or abnormality. Anasarca. Small volume pneumoperitoneum. Small volume ascites throughout the abdomen and pelvis. There are minimal areas of peritoneal thickening and nodularity, example in the right paracolic gutter, consistent with peritoneal metastatic disease. Musculoskeletal: No acute or significant osseous findings. IMPRESSION: 1. Small volume pneumoperitoneum, in keeping with findings of prior radiographs. 2. The small bowel is diffusely fluid-filled, somewhat thickened and hyperenhancing, although not overtly distended. There is no pneumatosis, portal venous gas, or other specific findings to localize source of pneumoperitoneum. 3. 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 4.  Diverticulosis without evidence of acute diverticulitis. 5.  Aortic Atherosclerosis (ICD10-I70.0). Findings discussed by telephone with Dr. PDahlia Byesat the time of interpretation. Electronically Signed   By: AEddie CandleM.D.   On: 07/05/2019 17:32   [place]RENAL  Result Date: 07/05/2019 CLINICAL DATA:  Acute renal injury, history of ovarian carcinoma EXAM: RENAL / URINARY TRACT ULTRASOUND COMPLETE COMPARISON:  None. FINDINGS: Right Kidney: Renal  measurements: 9.9 x 3.8 x 5.2 cm. = volume: 104 mL. Mild increased echogenicity is noted. No mass lesion or hydronephrosis is seen. Left Kidney: Renal measurements: 9.6 x 4.9 x 5.1 cm. = volume: 128 mL. Increased echogenicity is noted. No mass lesion or hydronephrosis is noted. Bladder: Partially decompressed Other: None. IMPRESSION: Mild increased echogenicity consistent with medical renal disease. No obstructive changes are noted. Electronically Signed   By: MInez CatalinaM.D.   On: 07/05/2019 13:11   [place]Abdomen Limited  Result Date: 07/05/2019 CLINICAL DATA:  Abdominal distension, possible ascites EXAM: LIMITED  ABDOMEN ULTRASOUND FOR ASCITES TECHNIQUE: Limited ultrasound survey for ascites was performed in all four abdominal quadrants. COMPARISON:  None. FINDINGS: Minimal ascites is noted. Previously seen plain film showed significant small bowel dilatation. IMPRESSION: Minimal ascites. This is not the etiology of the patient's abdominal distension. CT of the abdomen and pelvis is again recommended for further evaluation. Electronically Signed   By: Inez Catalina M.D.   On: 07/05/2019 13:12   US Paracentesis  Result Date: 06/30/2019 INDICATION: Ovarian cancer with ascites. Request for diagnostic and therapeutic paracentesis. EXAM: ULTRASOUND GUIDED PARACENTESIS MEDICATIONS: 1% lidocaine 10 mL COMPLICATIONS: None immediate. PROCEDURE: Informed written consent was obtained from the patient after a discussion of the risks, benefits and alternatives to treatment. A timeout was performed prior to the initiation of the procedure. Initial ultrasound scanning demonstrates a moderate amount of ascites within the right lower abdominal quadrant. The right lower abdomen was prepped and draped in the usual sterile fashion. 1% lidocaine was used for local anesthesia. Following this, a 6 Fr Safe-T-Centesis catheter was introduced. An ultrasound image was saved for documentation purposes. The paracentesis was performed.  The catheter was removed and a dressing was applied. The patient tolerated the procedure well without immediate post procedural complication. FINDINGS: A total of approximately 1 L of clear yellow fluid was removed. Samples were sent to the laboratory as requested by the clinical team. IMPRESSION: Successful ultrasound-guided paracentesis yielding 1 liter of peritoneal fluid. Read by: Gareth Eagle, PA-C Electronically Signed   By: Marcello Moores  Register   On: 06/30/2019 11:21   DG ABD ACUTE 2+V W 1V CHEST  Result Date: 07/06/2019 CLINICAL DATA:  Small-bowel obstruction and pneumoperitoneum. EXAM: DG ABDOMEN ACUTE W/ 1V CHEST COMPARISON:  07/05/2019; CT abdomen and pelvis-07/05/2019 FINDINGS: Redemonstrated marked gaseous distention of multiple loops of small bowel with index loop of small bowel within the right mid hemiabdomen measuring 7.6 cm in diameter. This finding is again associated with a conspicuous paucity of distal colonic gas. Small amount of pneumoperitoneum is seen within the upper abdomen, likely reduced in volume. No pneumatosis or portal venous gas Limited visualization of lower thorax demonstrates sequela of previous paraspinal fusion and corpectomy, incompletely evaluated. Degenerative change the lower lumbar spine is suspected though incompletely evaluated. IMPRESSION: 1. Similar findings worrisome for high-grade small bowel obstruction. 2. Small amount of pneumoperitoneum, likely reduced in volume compared to the 07/05/2019 examination. Electronically Signed   By: Sandi Mariscal M.D.   On: 07/06/2019 07:58   DG Abd Portable 1V  Result Date: 07/05/2019 CLINICAL DATA:  Abdominal distension EXAM: PORTABLE ABDOMEN - 1 VIEW COMPARISON:  06/01/2019 FINDINGS: Scattered large and small bowel gas is noted. Significant small bowel dilatation is noted to 7 cm. There are changes suspicious for free intraperitoneal air with regular sign and right upper quadrant air identified. CT of the abdomen and pelvis is  recommended for further evaluation. No bony abnormality is seen. IMPRESSION: Significant dilatation of the small bowel with evidence of free air within the abdomen. CT of the abdomen and pelvis is recommended for further evaluation. Electronically Signed   By: Inez Catalina M.D.   On: 07/05/2019 09:16    PERFORMANCE STATUS (ECOG) : 3 - Symptomatic, >50% confined to bed  Review of Systems Unless otherwise noted, a complete review of systems is negative.  Physical Exam General: NAD, thin, frail appearing Cardiovascular: regular rate and rhythm Pulmonary: clear ant fields Abdomen: distended GU: no suprapubic tenderness Extremities: no edema, no joint deformities Skin: no rashes Neurological:  Weakness but otherwise nonfocal  IMPRESSION: Patient is known to me from the clinic.  I saw her this morning and she had some situational confusion.  However, this afternoon she is much more oriented and was able to engage with me meaningfully in a conversation regarding her goals.  She introduced the topic of death and says that she feels she may be nearing end-of-life.  She is concerned with how her significant other, Meredith Jones, might take this news and asked that I help facilitate a conversation and update him regarding her medical care.  Patient tells me that Meredith Jones had a niece, Meredith Jones, who died of cancer.  She says that when the when the cancer reached a point where Tammy no longer had any viable treatment options, family opted to keep her comfortable.  Ms. Fabre says that she would want the same decision made for herself.   We discussed CODE STATUS and patient stated clearly that she would not want to be resuscitated nor have her life prolonged artificially on machines.  She says she would want to be a "DNR".  This conversation was witnessed by patient's nurse, Meredith Jones.   I called and updated patient's significant other.  He seem to recognize the severity of patient's current medical problems.  He asked about  the option of hospice involvement should patient decline.  In the interim, plan is to continue conservative treatment and see how patient does over the next couple of days.  He would like for patient to complete [facility] POA/living will documents if possible.  I will consult the chaplain to assist with this.  However, note the patient has stated multiple times that she would want Meredith Jones to be her decision-maker if necessary.  Patient does not really have any other family they could speak on her behalf.  PLAN: -Continue current scope of treatment -DNR/DNI -Chaplain to assist with ACP documents -Will follow  Case and plan discussed with Drs. Mena Goes   Time Total: 60 minutes  Visit consisted of counseling and education dealing with the complex and emotionally intense issues of symptom management and palliative care in the setting of serious and potentially life-threatening illness.Greater than 50%  of this time was spent counseling and coordinating care related to the above assessment and plan.  Signed by: Altha Harm, PhD, NP-C

## 2019-07-06 NOTE — Progress Notes (Signed)
While walking to nurses station near pt room pt noted crying for quietly, pt noted on floor sitting on knees/thighs with blood noted on RUA coming from former IV site, IV noted laying on bed with catheter intact, pt reports IV "came out" at time of fall, pt reports she attempted to get up without assistance to void, Sharion Settler, NP present at time pt noted on floor in room, charge nurse Jovita Kussmaul, RN made aware and entered pt room, additional blood from previous IV site noted on pt legs and floor, urine noted on floor, pt assisted back to bed with aid of NT, former IV site cleansed with NS and warm soap and water, gauze and tape applied to site, pt cleansed with warm soap and water, bedding and gown changed, pt reports 12/10 pain to back and lower abdomen, Sharion Settler, NP made aware of pt reports of pain, NP reports xry ordered, IV team consult entered, pt currently in bed with fall mats in place, bed in low safe position and call bell in reach, will continue to monitor pt this shift and report any changes or concerns to NP.

## 2019-07-06 NOTE — Progress Notes (Signed)
Pt removed 2 NGT. Pt was aware she removed the NGT and said she removed it because it was painful. No output from the suction. Dr. Posey Pronto was informed and ok not to reinsert the NGT.

## 2019-07-06 NOTE — Progress Notes (Signed)
Ch visited with Pt in response to request for an AD. Ch gave AD education to Pt. Pt will wait on significant other, Johnny to help complete AD. AD completion will depend on Johnny's work schedule. Pt said "I want to get well." Pt requested for prayer. Ch prayed with Pt. Pt was grateful for prayer and visit. Pt wanted remote for TV, and Ch has asked incoming chaplain to help get a remote for her.

## 2019-07-07 ENCOUNTER — Inpatient Hospital Stay: Payer: Commercial Managed Care - PPO

## 2019-07-07 DIAGNOSIS — N179 Acute kidney failure, unspecified: Secondary | ICD-10-CM | POA: Diagnosis not present

## 2019-07-07 DIAGNOSIS — K56609 Unspecified intestinal obstruction, unspecified as to partial versus complete obstruction: Secondary | ICD-10-CM | POA: Diagnosis not present

## 2019-07-07 DIAGNOSIS — K668 Other specified disorders of peritoneum: Secondary | ICD-10-CM | POA: Diagnosis not present

## 2019-07-07 DIAGNOSIS — Z515 Encounter for palliative care: Secondary | ICD-10-CM | POA: Diagnosis not present

## 2019-07-07 LAB — CBC WITH DIFFERENTIAL/PLATELET
Abs Immature Granulocytes: 0.13 10*3/uL — ABNORMAL HIGH (ref 0.00–0.07)
Basophils Absolute: 0 10*3/uL (ref 0.0–0.1)
Basophils Relative: 0 %
Eosinophils Absolute: 0 10*3/uL (ref 0.0–0.5)
Eosinophils Relative: 0 %
HCT: 27.7 % — ABNORMAL LOW (ref 36.0–46.0)
Hemoglobin: 9.3 g/dL — ABNORMAL LOW (ref 12.0–15.0)
Immature Granulocytes: 2 %
Lymphocytes Relative: 10 %
Lymphs Abs: 0.7 10*3/uL (ref 0.7–4.0)
MCH: 34.1 pg — ABNORMAL HIGH (ref 26.0–34.0)
MCHC: 33.6 g/dL (ref 30.0–36.0)
MCV: 101.5 fL — ABNORMAL HIGH (ref 80.0–100.0)
Monocytes Absolute: 0.3 10*3/uL (ref 0.1–1.0)
Monocytes Relative: 4 %
Neutro Abs: 5.6 10*3/uL (ref 1.7–7.7)
Neutrophils Relative %: 84 %
Platelets: 137 10*3/uL — ABNORMAL LOW (ref 150–400)
RBC: 2.73 MIL/uL — ABNORMAL LOW (ref 3.87–5.11)
RDW: 13.8 % (ref 11.5–15.5)
WBC: 6.8 10*3/uL (ref 4.0–10.5)
nRBC: 0 % (ref 0.0–0.2)

## 2019-07-07 LAB — COMPREHENSIVE METABOLIC PANEL
ALT: 12 U/L (ref 0–44)
AST: 16 U/L (ref 15–41)
Albumin: 1.7 g/dL — ABNORMAL LOW (ref 3.5–5.0)
Alkaline Phosphatase: 85 U/L (ref 38–126)
Anion gap: 9 (ref 5–15)
BUN: 36 mg/dL — ABNORMAL HIGH (ref 8–23)
CO2: 22 mmol/L (ref 22–32)
Calcium: 7.7 mg/dL — ABNORMAL LOW (ref 8.9–10.3)
Chloride: 106 mmol/L (ref 98–111)
Creatinine, Ser: 0.81 mg/dL (ref 0.44–1.00)
GFR calc Af Amer: >60 mL/min (ref >60)
GFR calc non Af Amer: >60 mL/min (ref >60)
Glucose, Bld: 141 mg/dL — ABNORMAL HIGH (ref 70–99)
Potassium: 4.5 mmol/L (ref 3.5–5.1)
Sodium: 137 mmol/L (ref 135–145)
Total Bilirubin: 0.9 mg/dL (ref 0.3–1.2)
Total Protein: 5.4 g/dL — ABNORMAL LOW (ref 6.5–8.1)

## 2019-07-07 LAB — GLUCOSE, CAPILLARY
Glucose-Capillary: 109 mg/dL — ABNORMAL HIGH (ref 70–99)
Glucose-Capillary: 112 mg/dL — ABNORMAL HIGH (ref 70–99)
Glucose-Capillary: 117 mg/dL — ABNORMAL HIGH (ref 70–99)
Glucose-Capillary: 119 mg/dL — ABNORMAL HIGH (ref 70–99)
Glucose-Capillary: 119 mg/dL — ABNORMAL HIGH (ref 70–99)
Glucose-Capillary: 127 mg/dL — ABNORMAL HIGH (ref 70–99)
Glucose-Capillary: 85 mg/dL (ref 70–99)

## 2019-07-07 LAB — MAGNESIUM: Magnesium: 2 mg/dL (ref 1.7–2.4)

## 2019-07-07 MED ORDER — SODIUM CHLORIDE 0.9 % IV SOLN
1.0000 g | Freq: Three times a day (TID) | INTRAVENOUS | Status: DC
  Administered 2019-07-07 – 2019-07-08 (×2): 1 g via INTRAVENOUS
  Filled 2019-07-07 (×4): qty 1

## 2019-07-07 NOTE — Progress Notes (Signed)
[facility] Progress Note  [name]    [id number]  DOA: 07/04/2019     Date of Service: the patient was seen and examined on 07/07/2019  Chief Complaint  Patient presents with   Weakness   Brief hospital course: Past medical history of high-grade serous adenocarcinoma stage IV.  SP chemotherapy.  Recent reoccurrence with ascites.  Hypothyroidism.  Type II IDDM.  Anemia chronic kidney disease. Now presents with complaints of fatigue and tiredness and found to have pneumoperitoneum likely from bowel rupture. General surgery, medical oncology and palliative care consulted. Currently further plan is continue current conservative measures.  Assessment and Plan: 1.  Pneumoperitoneum without any obvious cause. No evidence of ischemia or bowel pathology. Small bowel obstruction Presents with generalized weakness. X-ray abdomen shows evidence of pneumoperitoneum. CT abdomen with contrast was performed which confirmed the diagnosis although does not identify the location. Patient does not have any evidence of peritonitis or abdominal pain. Patient is very cachectic and debilitated. Per [facility] risk assessment her risk for significant cardiovascular outcome is 2.9%. Current recommendation is to continue conservative measures with n.p.o., IV antibiotics and serial abdominal exam and if the patient's condition deteriorates offer surgery.  Appreciate medical oncology's assistance in this condition. Medical oncology recommended to initiate the patient on Decadron and currently holding after 1 dose. They also recommended to start the patient on octreotide. Patient was also recommended to be on Reglan which I have discontinued given her bowel perforation. NG tube was inserted but patient pulled out it twice on 07/06/2019. NG tube was reinserted on 07/06/2019.  Following x-ray was actually showing worsening pneumoperitoneum. Discussed with medical oncology as well as general  surgery and recommend continue conservative measures. No change in patient's condition patient does not have any symptoms right now.  2.  Goals of care discussion Had multiple goals of care discussion with this patient Palliative care following with the patient as well.  Son currently DNR/DNI with a goal to continue to treat what is treatable. Ideally patient will benefit from transitioning to comfort care approach only.  3.  Type 2 insulin-dependent diabetes mellitus uncontrolled with hyperglycemia Currently n.p.o. Every 4 hours CBG. Monitor.  4.  Hypothyroidism Change oral Synthroid to IV Synthroid.  5.  Acute kidney injury In the setting of bowel perforation. Improving. Transition to LR  Monitor renal function avoid nephrotoxic medications.  6.  UTI. Concern for intra-abdominal infection given her pneumoperitoneum Initially on IV ceftriaxone. Changed to IV meropenem on 07/05/2019.  Diet: N.p.o. DVT Prophylaxis: Subcutaneous Lovenox   Advance goals of care discussion: Full code  Family Communication: family was present at bedside, at the time of interview.   Disposition:  Status is: Inpatient  Remains inpatient appropriate because:Ongoing diagnostic testing needed not appropriate for outpatient work up and IV treatments appropriate due to intensity of illness or inability to take PO   Dispo: The patient is from: Home              Anticipated d/c is to: SNF              Anticipated d/c date is: > 3 days              Patient currently is not medically stable to d/c.  Subjective: No abdominal pain no nausea no vomiting.  Tolerated NG tube well.  After NG tube insertion patient did have some increased pneumoperitoneum based on the x-ray but patient did not have any complaints of chest pain abdominal  pain nausea vomiting shortness of breath.  Physical Exam: General:  alert oriented to time, place, and person. Occasional confused.  Appear in mild distress, affect  appropriate Eyes: PERRL ENT: Oral Mucosa Clear, moist  Neck: no JVD,  Cardiovascular: S1 and S2 Present, no Murmur,  Respiratory: good respiratory effort, Bilateral Air entry equal and Decreased, no Crackles, no wheezes Abdomen: Bowel Sound present, Soft and distended, mild diffuse tenderness,  Skin: no rash Extremities: no Pedal edema, no calf tenderness Neurologic: without any new focal findings  Gait not checked due to patient safety concerns  Vitals:   07/06/19 1203 07/06/19 1534 07/06/19 2321 07/07/19 0722  BP: 105/65 105/69 102/66 119/67  Pulse: 89 83 70 71  Resp: 17 17  16   Temp: 97.8 F (36.6 C) 98.2 F (36.8 C) (!) 97.5 F (36.4 C) 97.7 F (36.5 C)  TempSrc:   Oral   SpO2: 97% 100% 95% 98%  Weight:      Height:        Intake/Output Summary (Last 24 hours) at 07/07/2019 1936 Last data filed at 07/07/2019 1319 Gross per 24 hour  Intake 887.51 ml  Output 0 ml  Net 887.51 ml   Filed Weights   07/04/19 1628 07/05/19 1057  Weight: 54.2 kg 53.2 kg    Data Reviewed: I have personally reviewed and interpreted daily labs, tele strips, imagings as discussed above. I reviewed all nursing notes, pharmacy notes, vitals, pertinent old records I have discussed plan of care as described above with RN and patient/family.  CBC: Recent Labs  Lab 07/04/19 1631 07/05/19 0717 07/06/19 0623 07/07/19 0748  WBC 10.8* 9.7 6.5 6.8  NEUTROABS 9.8*  --   --  5.6  HGB 9.8* 9.4* 10.0* 9.3*  HCT 28.1* 27.4* 30.4* 27.7*  MCV 96.9 100.0 103.4* 101.5*  PLT 169 165 152 0000000*   Basic Metabolic Panel: Recent Labs  Lab 07/04/19 1631 07/05/19 0717 07/06/19 0754 07/07/19 0748 07/07/19 0752  NA 133* 134* 135  --  137  K 3.3* 3.0* 2.7*  --  4.5  CL 97* 101 102  --  106  CO2 20* 17* 23  --  22  GLUCOSE 98 127* 113*  --  141*  BUN 75* 78* 49*  --  36*  CREATININE 1.67* 1.54* 0.96  --  0.81  CALCIUM 8.0* 7.8* 7.8*  --  7.7*  MG  --   --   --  2.0  --   PHOS  --   --  2.5  --   --       Studies: DG Abd 1 View  Addendum Date: 07/07/2019   ADDENDUM REPORT: 07/07/2019 12:54 ADDENDUM: Critical Value/emergent results were called by telephone at the time of interpretation on 07/07/2019 at 12:54 pm to provider [facility] Ameria Sanjurjo , who verbally acknowledged these results. Electronically Signed   By: Lowella Grip III M.D.   On: 07/07/2019 12:54   Result Date: 07/07/2019 CLINICAL DATA:  Nasogastric tube placement EXAM: ABDOMEN - 1 VIEW COMPARISON:  Jul 07, 2019 study obtained earlier in the day FINDINGS: Nasogastric tube tip and side port are in the stomach. There are multiple loops of dilated bowel. There is pneumoperitoneum. Lung bases are clear. There is postoperative change in the thoracic spine region. IMPRESSION: Nasogastric tube tip and side port in stomach. Persistent bowel dilatation. Increase in pneumoperitoneum compared to earlier in the day, a finding concerning for viscus perforation. Lung bases clear. These results will be called to the ordering  clinician or representative by the Radiologist Assistant, and communication documented in the PACS or [employer]. Electronically Signed: By: Lowella Grip III M.D. On: 07/07/2019 12:51   CT HEAD WO CONTRAST  Result Date: 07/06/2019 CLINICAL DATA:  [place], altered level of consciousness EXAM: CT HEAD WITHOUT CONTRAST TECHNIQUE: Contiguous axial images were obtained from the base of the skull through the vertex without intravenous contrast. COMPARISON:  None. FINDINGS: Brain: No acute infarct or hemorrhage. Lateral ventricles and midline structures are unremarkable. No acute extra-axial fluid collections. No mass effect. Vascular: No hyperdense vessel or unexpected calcification. Skull: Normal. Negative for fracture or focal lesion. Sinuses/Orbits: No acute finding. Other: None. IMPRESSION: 1. No acute intracranial process. Electronically Signed   By: Randa Ngo M.D.   On: 07/06/2019 20:56   DG Abd Portable 1V  Result Date:  07/07/2019 CLINICAL DATA:  Small bowel obstruction.  Pneumoperitoneum EXAM: PORTABLE ABDOMEN - 1 VIEW COMPARISON:  CT from 2 days ago FINDINGS: Continued small bowel obstruction with diffusely gas dilated bowel and thickened appearance of folds. Small volume pneumoperitoneum is likely seen below the right liver. The patient is supine, limiting direct comparison with yesterday. IMPRESSION: 1. Ongoing small bowel obstruction. 2. Supine positioning limits comparison with yesterday. Electronically Signed   By: Monte Fantasia M.D.   On: 07/07/2019 08:47    Scheduled Meds:  insulin aspart  0-9 Units Subcutaneous Q4H   levothyroxine  50 mcg Intravenous Daily   octreotide  100 mcg Subcutaneous Q8H   pantoprazole (PROTONIX) IV  40 mg Intravenous Q24H   Continuous Infusions:  lactated ringers 75 mL/hr at 07/07/19 1815   meropenem (MERREM) IV     PRN Meds:   Time spent: 35 minutes  Author: Berle Mull, MD [facility] 07/07/2019 7:36 PM  To reach On-call, see care teams to locate the attending and reach out to them via [email or web address]. If 7PM-7AM, please contact night-coverage If you still have difficulty reaching the attending provider, please page the [facility] (Director on Call) for [facility] on amion for assistance.

## 2019-07-07 NOTE — Progress Notes (Signed)
Telemetry phoned multiple times this shift to make aware pt telemetry monitor not connected/leads not in place, entered pt room multiple times to find pt had taken leads off stating she is unable to "sleep with them on." Education provided to pt multiple times about telemetry monitoring, purpose and need to stay in place, pt continued to remove leads after reapplying to pt multiple times with telemetry notifying multiple times, Sharion Settler, NP made aware pt noncompliant with telemetry monitoring, NP d/c'd telemetry, no further orders received, pt currently resting in bed with call bell in reach and bed in lowest position, fall mats in place and bed alarm on, will continue to monitor pt this shift and make NP aware of any changes in pt status or concerns.

## 2019-07-07 NOTE — Progress Notes (Signed)
[facility] SURGICAL PROGRESS NOTE (cpt [phone])  Hospital Day(s): 3.   Interval History: Patient seen and examined, she had NGT placed yesterday afternoon for symptom control and proximal decompression however she fairly quickly removed this and it was not reinserted. She also met with oncology and palliative care yesterday and goals of care were discussed. She is now DNR/DNI and currently I believe the plan is to continue to reassess clinical condition and determine next steps as far as GOC.  This morning, KUB continues to show abdominal distension. She continues to have abdominal soreness and distension. She did report a bowel movement yesterday. No fever, chills, or nausea.   Review of Systems:  Constitutional: denies fever, chills  HEENT: denies cough or congestion  Respiratory: denies any shortness of breath  Cardiovascular: denies chest pain or palpitations  Gastrointestinal: + abdominal pain, + distension, denied N/V, or diarrhea/and bowel function as per interval history Genitourinary: denies burning with urination or urinary frequency   Vital signs in last 24 hours: [min-max] current  Temp:  [97.4 F (36.3 C)-98.2 F (36.8 C)] 97.5 F (36.4 C) (05/24 2321) Pulse Rate:  [70-89] 70 (05/24 2321) Resp:  [17] 17 (05/24 1534) BP: (102-105)/(65-69) 102/66 (05/24 2321) SpO2:  [95 %-100 %] 95 % (05/24 2321)     Height: _0  (162.6 cm) Weight: 53.2 kg BMI (Calculated): 20.12   Intake/Output last 2 shifts:  05/24 0701 - 05/25 0700 In: 887.5 [I.V.:887.5] Out: -    Physical Exam:  Constitutional: Cachetic. alert, cooperative and no distress  HENT: normocephalic without obvious abnormality  Eyes: PERRL, EOM's grossly intact and symmetric  Respiratory: breathing non-labored at rest  Cardiovascular: regular rate and sinus rhythm  Gastrointestinal: Abdomen is distended, lower abdominal soreness, no rebound/guarding Musculoskeletal: No edema or wounds, motor and  sensation grossly intact, NT    Labs:  CBC Latest Ref Rng & Units 07/06/2019 07/05/2019 07/04/2019  WBC 4.0 - 10.5 K/uL 6.5 9.7 10.8(H)  Hemoglobin 12.0 - 15.0 g/dL 10.0(L) 9.4(L) 9.8(L)  Hematocrit 36.0 - 46.0 % 30.4(L) 27.4(L) 28.1(L)  Platelets 150 - 400 K/uL 152 165 169   CMP Latest Ref Rng & Units 07/06/2019 07/05/2019 07/04/2019  Glucose 70 - 99 mg/dL 113(H) 127(H) 98  BUN 8 - 23 mg/dL 49(H) 78(H) 75(H)  Creatinine 0.44 - 1.00 mg/dL 0.96 1.54(H) 1.67(H)  Sodium 135 - 145 mmol/L 135 134(L) 133(L)  Potassium 3.5 - 5.1 mmol/L 2.7(LL) 3.0(L) 3.3(L)  Chloride 98 - 111 mmol/L 102 101 97(L)  CO2 22 - 32 mmol/L 23 17(L) 20(L)  Calcium 8.9 - 10.3 mg/dL 7.8(L) 7.8(L) 8.0(L)  Total Protein 6.5 - 8.1 g/dL - - 5.5(L)  Total Bilirubin 0.3 - 1.2 mg/dL - - 1.0  Alkaline Phos 38 - 126 U/L - - 88  AST 15 - 41 U/L - - 25  ALT 0 - 44 U/L - - 19     Imaging studies:   KUB (07/08/2019) personally reviewed which shows persistent small bowel dilation, and radiologist report reviewed:  IMPRESSION: 1. Ongoing small bowel obstruction. 2. Supine positioning limits comparison with yesterday.   Assessment/Plan: (ICD-10's: K8.609) 64 y.o. 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    - She is will to try NGT again today, will order this, LIS, monitor output   - Again this is a very challenging  situation with poor prognosis. No surgical intervention is recommended.    - Palliative and oncology following   - Pain control prn; antiemetics prn  - monitor abdominal examination; on-going bowel function  - mobilization encouraged    All of the above findings and recommendations were discussed with the patient, and the medical team, and all of patient's questions were  answered to her expressed satisfaction.  -- Edison Simon, PA-C [facility] 07/07/2019, 7:14 AM [phone] M-F: 7am - 4pm

## 2019-07-07 NOTE — Evaluation (Signed)
Occupational Therapy Evaluation Patient Details Name: Meredith Jones MRN: [id number] DOB: [birth year] Today's Date: 07/07/2019    History of Present Illness Pt is a 64 y.o. female presenting to hospital 5/22 with weakness and abdominal cramping; of note pt being treated at [facility] for ovarian CA.  Pt admitted with pneumoperitoneum, DM, hypothyroidism, AKI, and UTI.  PMH includes anemia, DM, and h/o lumbar surgery.   Clinical Impression   Meredith Jones was seen for OT evaluation this date. Prior to hospital admission, pt was generally independent or modified independent with BADL management. She reports using a 4WW for functional mobility and consistently using her walk-in shower with grab bars for bathing. She denies additional falls history in the pas year and did not require physical assist from caregivers for ADL tasks at home. Pt lives with her significant other who is able to provide intermittent care. Currently pt demonstrates impairments as described below (See OT problem list) which functionally limit her ability to perform ADL/self-care tasks. Pt currently requires CGA to close supervision during functional mobility and ADL management. Pt would benefit from skilled OT services to address noted impairments and functional limitations (see below for any additional details) in order to maximize safety and independence while minimizing falls risk and caregiver burden. Upon hospital discharge, recommend HHOT to maximize pt safety and return to functional independence during meaningful occupations of daily life.      Follow Up Recommendations  Home health OT;Supervision/Assistance - 24 hour    Equipment Recommendations  3 in 1 bedside commode    Recommendations for Other Services       Precautions / Restrictions Precautions Precautions: Fall Precaution Comments: NPO Restrictions Weight Bearing Restrictions: No      Mobility Bed Mobility Overal bed mobility: Needs Assistance Bed  Mobility: Rolling;Sidelying to Sit Rolling: Min guard Sidelying to sit: Min guard Supine to sit: Supervision     General bed mobility comments: Increased cueing for sequencing "log roll" technique.  Transfers Overall transfer level: Needs assistance Equipment used: Rolling walker (2 wheeled) Transfers: Sit to/from Stand Sit to Stand: Min guard              Balance Overall balance assessment: Needs assistance Sitting-balance support: Feet supported;Single extremity supported Sitting balance-Leahy Scale: Good Sitting balance - Comments: Pt able to sit briefly w/o UE support, but generally maintains 1UE support on bed rails 2/2 fatigue.   Standing balance support: Bilateral upper extremity supported Standing balance-Leahy Scale: Fair Standing balance comment: Pt tends to lean with BUE on counter during functional tasks.                           ADL either performed or assessed with clinical judgement   ADL Overall ADL's : Needs assistance/impaired                                       General ADL Comments: Pt functionally limited by generalized weakness, decreased activity tolerance, & increased abdominal pain with mobility. She requires close CGA for safety and seated therapeutic rest breaks t/o ADL management including standing grooming & UB dressing tasks.     Vision Baseline Vision/History: Wears glasses Wears Glasses: Reading only Patient Visual Report: No change from baseline       Perception     Praxis      Pertinent Vitals/Pain Pain Score: 7  Pain  Location: abdomen Pain Descriptors / Indicators: Tender;Discomfort;Grimacing;Tightness;Sore Pain Intervention(s): Limited activity within patient's tolerance;Monitored during session;Repositioned;Utilized relaxation techniques     Hand Dominance Right   Extremity/Trunk Assessment Upper Extremity Assessment Upper Extremity Assessment: Generalized weakness   Lower Extremity  Assessment Lower Extremity Assessment: Generalized weakness   Cervical / Trunk Assessment Cervical / Trunk Assessment: Normal   Communication Communication Communication: No difficulties   Cognition Arousal/Alertness: Awake/alert Behavior During Therapy: WFL for tasks assessed/performed Overall Cognitive Status: Within Functional Limits for tasks assessed                                     General Comments  Pt noted with multiple bruises across BUE. Per sitter in room, pt had recent hospital fall. In process of performing bed mobility, pt attempted to weight bear through L forearm which resulted in thin skin over bruises sheering and small amount of bleeding at pt forearm. RN notified and in room to assess/bandage pt arm during session. Pt denies additional pain/discomfort from forearms during functional tasks.    Exercises Other Exercises Other Exercises: Pt educated on falls prevention strategies for home and hospital, safe transfer techniuqes, safe use of AE for functional mobility, and role of OT in acute setting. Other Exercises: OT facilitates funtional tasks including functional transfers, standing grooming tasks, and seated bathing this date.   Shoulder Instructions      Home Living Family/patient expects to be discharged to:: Private residence Living Arrangements: Spouse/significant other(Pt boyfriend.) Available Help at Discharge: Friend(s) Type of Home: House Home Access: Ramped entrance     Home Layout: One level     Bathroom Shower/Tub: Occupational psychologist: [place];Shower seat;Walker - 4 wheels;Walker - 2 wheels          Prior Functioning/Environment Level of Independence: Independent with assistive device(s)        Comments: Ambulatory with 4ww; no other falls in last 6 months (except the one during this admission)        OT Problem List: Decreased strength;Decreased  coordination;Pain;Decreased activity tolerance;Decreased safety awareness;Impaired balance (sitting and/or standing);Decreased knowledge of use of DME or AE;Decreased range of motion      OT Treatment/Interventions: Self-care/ADL training;Therapeutic activities;Therapeutic exercise;Energy conservation;DME and/or AE instruction;Patient/family education;Balance training;Cognitive remediation/compensation    OT Goals(Current goals can be found in the care plan section) Acute Rehab OT Goals Patient Stated Goal: to go home; to eat OT Goal Formulation: With patient Time For Goal Achievement: 07/21/19 Potential to Achieve Goals: Good ADL Goals Pt Will Perform Grooming: standing;with set-up;with supervision;with caregiver independent in assisting(LRAD PRN for improved safety and functional independence.) Pt Will Transfer to Toilet: bedside commode;with supervision;with set-up;ambulating(LRAD PRN for improved safety and functional independence.) Pt Will Perform Toileting - Clothing Manipulation and hygiene: sit to/from stand;with set-up;with supervision;with adaptive equipment(LRAD PRN for improved safety and functional independence.)  OT Frequency: Min 2X/week   Barriers to D/C: Decreased caregiver support          Co-evaluation              AM-PAC OT "6 Clicks" Daily Activity     Outcome Measure Help from another person eating meals?: A Little(Pt currently NPO) Help from another person taking care of personal grooming?: A Little Help from another person toileting, which includes using toliet, bedpan, or urinal?: A Little Help from another person bathing (including  washing, rinsing, drying)?: A Little Help from another person to put on and taking off regular upper body clothing?: A Little Help from another person to put on and taking off regular lower body clothing?: A Little 6 Click Score: 18   End of Session Equipment Utilized During Treatment: Gait belt;Rolling walker Nurse  Communication: Other (comment)(Pt with forearm wounds noted to be bleeding after bed mobility. RN in during session to assess.)  Activity Tolerance: Patient tolerated treatment well Patient left: in chair;with call bell/phone within reach;with chair alarm set;with nursing/sitter in room  OT Visit Diagnosis: Other abnormalities of gait and mobility (R26.89);Muscle weakness (generalized) (M62.81);Pain Pain - Right/Left: (both) Pain - part of body: (Abdominal)                Time: [id number] OT Time Calculation (min): 57 min Charges:  OT General Charges $OT Visit: 1 Visit OT Evaluation $OT Eval Moderate Complexity: 1 Mod OT Treatments $Self Care/Home Management : 38-52 mins  Shara Blazing, M.S., OTR/L Ascom: [phone] 07/07/19, 1:54 PM

## 2019-07-07 NOTE — Progress Notes (Signed)
Hematology/Oncology Consult note [facility]  [phone] [phone]  Patient Care Team: Rubye Beach as PCP - General (Family Medicine) Clent Jacks, RN as Oncology Nurse Navigator   Name of the [name]  [id number]  Aug 13, 1955   Date of visit: 07/08/19   Interval history- denies any significant abominal pain. Abdomen remains distended. No significant output from NG tube which was reinserted. She says she is hungry and wants to eat    Review of systems- Review of Systems  Constitutional: Positive for malaise/fatigue. Negative for chills, fever and weight loss.  HENT: Negative for congestion, ear discharge and nosebleeds.   Eyes: Negative for blurred vision.  Respiratory: Negative for cough, hemoptysis, sputum production, shortness of breath and wheezing.   Cardiovascular: Negative for chest pain, palpitations, orthopnea and claudication.  Gastrointestinal: Negative for abdominal pain, blood in stool, constipation, diarrhea, heartburn, melena, nausea and vomiting.       Abdominal distension  Genitourinary: Negative for dysuria, flank pain, frequency, hematuria and urgency.  Musculoskeletal: Negative for back pain, joint pain and myalgias.  Skin: Negative for rash.  Neurological: Negative for dizziness, tingling, focal weakness, seizures, weakness and headaches.  Endo/Heme/Allergies: Does not bruise/bleed easily.  Psychiatric/Behavioral: Negative for depression and suicidal ideas. The patient does not have insomnia.       Allergies  Allergen Reactions  . Clarithromycin Nausea Only  . Doxycycline Nausea Only    dizziness  . Penicillins Hives    Did it involve swelling of the face/tongue/throat, SOB, or low BP? No Did it involve sudden or severe rash/hives, skin peeling, or any reaction on the inside of your mouth or nose? No Did you need to seek medical attention at a hospital or doctor's office?  No When did it last happen?50+ years ago If all above answers are "NO", may proceed with cephalosporin use.      Past Medical History:  Diagnosis Date  . Allergy   . Anemia   . Complication of anesthesia   . Diabetes mellitus without complication ([place])   . Diverticulosis   . Family history of adverse reaction to anesthesia    mom-n/v  . Family history of lung cancer   . GERD (gastroesophageal reflux disease)    h/o  . Hyperlipidemia   . Hypothyroidism   . Ovarian cancer ([place]) 08/04/2018   Chemo tx's and Hysterectomy.  . Personal history of chemotherapy   . PONV (postoperative nausea and vomiting)    sick with tonsillectomy  . 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 Thyroid disease      Past Surgical History:  Procedure Laterality Date  . ABDOMINAL HYSTERECTOMY    . BACK SURGERY  2007   Lumbar Surgery  . BREAST CYST ASPIRATION Right    Around 10 years ago. Pt thinks it was the right breast but not sure.  Marland Kitchen CATARACT EXTRACTION Left 2010   Right Eye 2011  . COLONOSCOPY WITH PROPOFOL N/A 09/02/2015   Procedure: COLONOSCOPY WITH PROPOFOL;  Surgeon: [name], MD;  Location: [place];  Service: Endoscopy;  Laterality: N/A;  Diabetic - oral meds  . ESOPHAGOGASTRODUODENOSCOPY (EGD) WITH PROPOFOL N/A 11/03/2015   Procedure: ESOPHAGOGASTRODUODENOSCOPY (EGD) WITH PROPOFOL;  Surgeon: [name], MD;  Location: [place];  Service: Endoscopy;  Laterality: N/A;  . EYE SURGERY Bilateral 06/2014    tear duck catherization ( Dr. Vallarie Mare)  . LAPAROSCOPIC APPENDECTOMY  11/26/2018   Procedure: APPENDECTOMY LAPAROSCOPIC;  Surgeon: Maceo Pro, MD;  Location: [facility];  Service: Gynecology;;  . POLYPECTOMY  09/02/2015   Procedure: POLYPECTOMY;  Surgeon: [name], MD;  Location: [place];  Service: Endoscopy;;  . TONSILLECTOMY  1963    Social History   Socioeconomic History  . Marital status: Single    Spouse name: Not on file  .  Number of children: Not on file  . Years of education: Not on file  . Highest education level: Not on file  Occupational History  . Not on file  Tobacco Use  . Smoking status: Never Smoker  . Smokeless tobacco: Never Used  Substance and Sexual Activity  . Alcohol use: No  . Drug use: No  . Sexual activity: Not Currently  Other Topics Concern  . Not on file  Social History Narrative  . Not on file   Social Determinants of Health   Financial Resource Strain:   . Difficulty of Paying Living Expenses:   Food Insecurity:   . Worried About Charity fundraiser in the Last Year:   . Arboriculturist in the Last Year:   Transportation Needs:   . Film/video editor (Medical):   Marland Kitchen Lack of Transportation (Non-Medical):   Physical Activity:   . Days of Exercise per Week:   . Minutes of Exercise per Session:   Stress:   . Feeling of Stress :   Social Connections:   . Frequency of Communication with Friends and Family:   . Frequency of Social Gatherings with Friends and Family:   . Attends Religious Services:   . Active Member of Clubs or Organizations:   . Attends Archivist Meetings:   Marland Kitchen Marital Status:   Intimate Partner Violence:   . Fear of Current or Ex-Partner:   . Emotionally Abused:   Marland Kitchen Physically Abused:   . Sexually Abused:     Family History  Problem Relation Age of Onset  . Congestive Heart Failure Mother   . Diabetes Mother   . Stroke Mother   . Hypothyroidism Father   . Cancer Father        lung cancer  . Diabetes Father   . Cancer Paternal Grandmother        stomach  . Breast cancer Neg Hx      Current Facility-Administered Medications:  .  insulin aspart (novoLOG) injection 0-9 Units, 0-9 Units, Subcutaneous, Q4H, Lavina Hamman, MD, 1 Units at 07/07/19 0047 .  lactated ringers infusion, , Intravenous, Continuous, Lavina Hamman, MD, Last Rate: 75 mL/hr at 07/07/19 0448, New Bag at 07/07/19 0448 .  levothyroxine (SYNTHROID, LEVOTHROID)  injection 50 mcg, 50 mcg, Intravenous, Daily, Lavina Hamman, MD, 50 mcg at 07/07/19 0846 .  meropenem (MERREM) 1 g in sodium chloride 0.9 % 100 mL IVPB, 1 g, Intravenous, Q8H, Berle Mull M, MD .  octreotide (SANDOSTATIN) injection 100 mcg, 100 mcg, Subcutaneous, Q8H, Sindy Guadeloupe, MD, 100 mcg at 07/07/19 1334 .  pantoprazole (PROTONIX) injection 40 mg, 40 mg, Intravenous, Q24H, Lavina Hamman, MD, 40 mg at 07/07/19 1333  Facility-Administered Medications Ordered in Other Encounters:  .  sodium chloride flush (NS) 0.9 % injection 10 mL, 10 mL, Intravenous, Once, Sindy Guadeloupe, MD .  sodium chloride flush (NS) 0.9 % injection 10 mL, 10 mL, Intravenous, Once, Sindy Guadeloupe, MD .  sodium chloride flush (NS) 0.9 % injection 10 mL, 10 mL, Intravenous, Once, Sindy Guadeloupe, MD  Physical  exam:  Vitals:   07/06/19 1203 07/06/19 1534 07/06/19 2321 07/07/19 0722  BP: 105/65 105/69 102/66 119/67  Pulse: 89 83 70 71  Resp: 17 17  16   Temp: 97.8 F (36.6 C) 98.2 F (36.8 C) (!) 97.5 F (36.4 C) 97.7 F (36.5 C)  TempSrc:   Oral   SpO2: 97% 100% 95% 98%  Weight:      Height:       Physical Exam Cardiovascular:     Rate and Rhythm: Normal rate and regular rhythm.     Heart sounds: Normal heart sounds.  Pulmonary:     Effort: Pulmonary effort is normal.     Breath sounds: Normal breath sounds.  Abdominal:     Comments: Distended, tympanic  Skin:    General: Skin is warm and dry.  Neurological:     Mental Status: She is alert and oriented to person, place, and time.      CMP Latest Ref Rng & Units 07/07/2019  Glucose 70 - 99 mg/dL 141(H)  BUN 8 - 23 mg/dL 36(H)  Creatinine 0.44 - 1.00 mg/dL 0.81  Sodium 135 - 145 mmol/L 137  Potassium 3.5 - 5.1 mmol/L 4.5  Chloride 98 - 111 mmol/L 106  CO2 22 - 32 mmol/L 22  Calcium 8.9 - 10.3 mg/dL 7.7(L)  Total Protein 6.5 - 8.1 g/dL 5.4(L)  Total Bilirubin 0.3 - 1.2 mg/dL 0.9  Alkaline Phos 38 - 126 U/L 85  AST 15 - 41 U/L 16  ALT 0 -  44 U/L 12   CBC Latest Ref Rng & Units 07/07/2019  WBC 4.0 - 10.5 K/uL 6.8  Hemoglobin 12.0 - 15.0 g/dL 9.3(L)  Hematocrit 36.0 - 46.0 % 27.7(L)  Platelets 150 - 400 K/uL 137(L)    @IMAGES @  DG Lumbar Spine 2-3 Views  Result Date: 07/06/2019 CLINICAL DATA:  Back pain EXAM: LUMBAR SPINE - 2-3 VIEW COMPARISON:  Abdominal radiographs-earlier same day; CT abdomen and pelvis-07/05/2019 FINDINGS: There are 6 non rib-bearing lumbar type vertebral bodies. For the purposes of this dictation, lumbar levels will be labeled L1 through L6. Grade 1 anterolisthesis of L4 upon L5 measuring approximately 3 mm. No definite pars defects Lumbar vertebral body heights appear preserved Mild to moderate multilevel lumbar spine DDD, worse at L5-L6 with disc space height loss, endplate irregularity and sclerosis Redemonstrated marked gaseous distention of multiple loops of small bowel. Small amount of pneumoperitoneum is seen within the upper abdomen. Regional soft tissues appear normal. IMPRESSION: 1. Transitional anatomy with spinal labeling as above. 2. Mild to moderate multilevel lumbar spine DDD, worse at L5-L6. Electronically Signed   By: Sandi Mariscal M.D.   On: 07/06/2019 08:01   DG Abd 1 View  Addendum Date: 07/07/2019   ADDENDUM REPORT: 07/07/2019 12:54 ADDENDUM: Critical Value/emergent results were called by telephone at the time of interpretation on 07/07/2019 at 12:54 pm to provider [facility] PATEL , who verbally acknowledged these results. Electronically Signed   By: Lowella Grip III M.D.   On: 07/07/2019 12:54   Result Date: 07/07/2019 CLINICAL DATA:  Nasogastric tube placement EXAM: ABDOMEN - 1 VIEW COMPARISON:  Jul 07, 2019 study obtained earlier in the day FINDINGS: Nasogastric tube tip and side port are in the stomach. There are multiple loops of dilated bowel. There is pneumoperitoneum. Lung bases are clear. There is postoperative change in the thoracic spine region. IMPRESSION: Nasogastric tube tip and  side port in stomach. Persistent bowel dilatation. Increase in pneumoperitoneum compared to earlier in  the day, a finding concerning for viscus perforation. Lung bases clear. These results will be called to the ordering clinician or representative by the Radiologist Assistant, and communication documented in the PACS or [employer]. Electronically Signed: By: Lowella Grip III M.D. On: 07/07/2019 12:51   DG Abd 1 View  Result Date: 07/06/2019 CLINICAL DATA:  Check gastric catheter placement EXAM: ABDOMEN - 1 VIEW COMPARISON:  Film from earlier in the same day. FINDINGS: Persistent small bowel obstructive change is noted stable from the prior examination. Gastric catheter has been advanced with the tip in the stomach although the proximal side port lies in the distal esophagus and should be advanced several cm. Increase in the degree of free air is noted beneath the diaphragms. IMPRESSION: Gastric catheter with the tip in the stomach. This should be advanced several cm further into the stomach. Persistent small bowel obstructive change. Increasing pneumoperitoneum. These results will be called to the ordering clinician or representative by the Radiologist Assistant, and communication documented in the PACS or [employer]. Electronically Signed   By: Inez Catalina M.D.   On: 07/06/2019 13:26   CT HEAD WO CONTRAST  Result Date: 07/06/2019 CLINICAL DATA:  [place], altered level of consciousness EXAM: CT HEAD WITHOUT CONTRAST TECHNIQUE: Contiguous axial images were obtained from the base of the skull through the vertex without intravenous contrast. COMPARISON:  None. FINDINGS: Brain: No acute infarct or hemorrhage. Lateral ventricles and midline structures are unremarkable. No acute extra-axial fluid collections. No mass effect. Vascular: No hyperdense vessel or unexpected calcification. Skull: Normal. Negative for fracture or focal lesion. Sinuses/Orbits: No acute finding. Other: None. IMPRESSION: 1.  No acute intracranial process. Electronically Signed   By: Randa Ngo M.D.   On: 07/06/2019 20:56   CT ABDOMEN PELVIS W CONTRAST  Result Date: 07/05/2019 CLINICAL DATA:  History of ovarian cancer, free air in the abdomen on prior radiographs EXAM: CT ABDOMEN AND PELVIS WITH CONTRAST TECHNIQUE: Multidetector CT imaging of the abdomen and pelvis was performed using the standard protocol following bolus administration of intravenous contrast. CONTRAST:  67mL OMNIPAQUE IOHEXOL 300 MG/ML  SOLN COMPARISON:  Same-day abdominal radiographs, CT abdomen pelvis, 06/01/2019 FINDINGS: Lower chest: No acute abnormality. Hepatobiliary: No solid liver abnormality is seen. No gallstones, gallbladder wall thickening, or biliary dilatation. Pancreas: Unremarkable. No pancreatic ductal dilatation or surrounding inflammatory changes. Spleen: Normal in size without significant abnormality. Adrenals/Urinary Tract: Adrenal glands are unremarkable. Kidneys are normal, without renal calculi, solid lesion, or hydronephrosis. Bladder is unremarkable. Stomach/Bowel: Stomach is within normal limits. The small bowel is diffusely fluid-filled, somewhat thickened and hyperenhancing although not overtly distended. The colon is generally decompressed with scattered gas present to the rectum. Sigmoid diverticulosis. Vascular/Lymphatic: Aortic atherosclerosis. No enlarged abdominal or pelvic lymph nodes. Reproductive: Status post hysterectomy and oophorectomy. Other: No abdominal wall hernia or abnormality. Anasarca. Small volume pneumoperitoneum. Small volume ascites throughout the abdomen and pelvis. There are minimal areas of peritoneal thickening and nodularity, example in the right paracolic gutter, consistent with peritoneal metastatic disease. Musculoskeletal: No acute or significant osseous findings. IMPRESSION: 1. Small volume pneumoperitoneum, in keeping with findings of prior radiographs. 2. The small bowel is diffusely fluid-filled,  somewhat thickened and hyperenhancing, although not overtly distended. There is no pneumatosis, portal venous gas, or other specific findings to localize source of pneumoperitoneum. 3. 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 4.  Diverticulosis without evidence of acute diverticulitis.  5.  Aortic Atherosclerosis (ICD10-I70.0). Findings discussed by telephone with Dr. Dahlia Byes at the time of interpretation. Electronically Signed   By: Eddie Candle M.D.   On: 07/05/2019 17:32   US RENAL  Result Date: 07/05/2019 CLINICAL DATA:  Acute renal injury, history of ovarian carcinoma EXAM: RENAL / URINARY TRACT ULTRASOUND COMPLETE COMPARISON:  None. FINDINGS: Right Kidney: Renal measurements: 9.9 x 3.8 x 5.2 cm. = volume: 104 mL. Mild increased echogenicity is noted. No mass lesion or hydronephrosis is seen. Left Kidney: Renal measurements: 9.6 x 4.9 x 5.1 cm. = volume: 128 mL. Increased echogenicity is noted. No mass lesion or hydronephrosis is noted. Bladder: Partially decompressed Other: None. IMPRESSION: Mild increased echogenicity consistent with medical renal disease. No obstructive changes are noted. Electronically Signed   By: Inez Catalina M.D.   On: 07/05/2019 13:11   US Abdomen Limited  Result Date: 07/05/2019 CLINICAL DATA:  Abdominal distension, possible ascites EXAM: LIMITED ABDOMEN ULTRASOUND FOR ASCITES TECHNIQUE: Limited ultrasound survey for ascites was performed in all four abdominal quadrants. COMPARISON:  None. FINDINGS: Minimal ascites is noted. Previously seen plain film showed significant small bowel dilatation. IMPRESSION: Minimal ascites. This is not the etiology of the patient's abdominal distension. CT of the abdomen and pelvis is again recommended for further evaluation. Electronically Signed   By: Inez Catalina M.D.   On: 07/05/2019 13:12   US Paracentesis  Result  Date: 06/30/2019 INDICATION: Ovarian cancer with ascites. Request for diagnostic and therapeutic paracentesis. EXAM: ULTRASOUND GUIDED PARACENTESIS MEDICATIONS: 1% lidocaine 10 mL COMPLICATIONS: None immediate. PROCEDURE: Informed written consent was obtained from the patient after a discussion of the risks, benefits and alternatives to treatment. A timeout was performed prior to the initiation of the procedure. Initial ultrasound scanning demonstrates a moderate amount of ascites within the right lower abdominal quadrant. The right lower abdomen was prepped and draped in the usual sterile fashion. 1% lidocaine was used for local anesthesia. Following this, a 6 Fr Safe-T-Centesis catheter was introduced. An ultrasound image was saved for documentation purposes. The paracentesis was performed. The catheter was removed and a dressing was applied. The patient tolerated the procedure well without immediate post procedural complication. FINDINGS: A total of approximately 1 L of clear yellow fluid was removed. Samples were sent to the laboratory as requested by the clinical team. IMPRESSION: Successful ultrasound-guided paracentesis yielding 1 liter of peritoneal fluid. Read by: Gareth Eagle, PA-C Electronically Signed   By: Marcello Moores  Register   On: 06/30/2019 11:21   DG ABD ACUTE 2+V W 1V CHEST  Result Date: 07/06/2019 CLINICAL DATA:  Small-bowel obstruction and pneumoperitoneum. EXAM: DG ABDOMEN ACUTE W/ 1V CHEST COMPARISON:  07/05/2019; CT abdomen and pelvis-07/05/2019 FINDINGS: Redemonstrated marked gaseous distention of multiple loops of small bowel with index loop of small bowel within the right mid hemiabdomen measuring 7.6 cm in diameter. This finding is again associated with a conspicuous paucity of distal colonic gas. Small amount of pneumoperitoneum is seen within the upper abdomen, likely reduced in volume. No pneumatosis or portal venous gas Limited visualization of lower thorax demonstrates sequela of  previous paraspinal fusion and corpectomy, incompletely evaluated. Degenerative change the lower lumbar spine is suspected though incompletely evaluated. IMPRESSION: 1. Similar findings worrisome for high-grade small bowel obstruction. 2. Small amount of pneumoperitoneum, likely reduced in volume compared to the 07/05/2019 examination. Electronically Signed   By: Sandi Mariscal M.D.   On: 07/06/2019 07:58   DG Abd Portable 1V  Result Date: 07/07/2019 CLINICAL DATA:  Small  bowel obstruction.  Pneumoperitoneum EXAM: PORTABLE ABDOMEN - 1 VIEW COMPARISON:  CT from 2 days ago FINDINGS: Continued small bowel obstruction with diffusely gas dilated bowel and thickened appearance of folds. Small volume pneumoperitoneum is likely seen below the right liver. The patient is supine, limiting direct comparison with yesterday. IMPRESSION: 1. Ongoing small bowel obstruction. 2. Supine positioning limits comparison with yesterday. Electronically Signed   By: Monte Fantasia M.D.   On: 07/07/2019 08:47   DG Abd Portable 1V  Result Date: 07/05/2019 CLINICAL DATA:  Abdominal distension EXAM: PORTABLE ABDOMEN - 1 VIEW COMPARISON:  06/01/2019 FINDINGS: Scattered large and small bowel gas is noted. Significant small bowel dilatation is noted to 7 cm. There are changes suspicious for free intraperitoneal air with regular sign and right upper quadrant air identified. CT of the abdomen and pelvis is recommended for further evaluation. No bony abnormality is seen. IMPRESSION: Significant dilatation of the small bowel with evidence of free air within the abdomen. CT of the abdomen and pelvis is recommended for further evaluation. Electronically Signed   By: Inez Catalina M.D.   On: 07/05/2019 09:16     Assessment and plan- Patient is a 64 y.o. 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 NG tube was pulled  out twice then reinserted this morning. No significant output. Abdomen remains distended and typmanic. However patient remains alert, afebrile and hemodynamically stable. Continue octreotide. Steroids, regaln on hold.   We discussed that this is unlikely to get better since it has been >24 hours and conservative measures have not worked. We discussed continuing conservative measures versus focusing purely on comfort and allowing her to eat what she wants understanding that this could potentially lead to further bowel perforation and possibly imminent death. However even with NPO we have not seen any significant progress. We are not looking at any meaning treatment options at this time. She is a DNR/DNI. She is willing to proceed with hospice home when bed becomes available.  She would like to wait today and see what tomorrow brings. If she is no better, she would like to focus on comfort measures and be allowed to eat. I have discussed this with NP Vonna Kotyk borders who will see her tomorrow morning and discuss this further.    Visit Diagnosis 1. Weakness   2. AKI (acute kidney injury) ([place])   3. Distended abdomen   4. Ascites   5. Pneumoperitoneum   6. Pain   7. Small bowel obstruction ([place])   8. Encounter for imaging study to confirm nasogastric (NG) tube placement   9. SBO (small bowel obstruction) ([place])   10. Encounter for nasogastric (NG) tube placement      Dr. Randa Evens, MD, MPH [facility] [id number] 07/07/2019 4:00 PM

## 2019-07-07 NOTE — Progress Notes (Signed)
PHARMACY NOTE:  ANTIMICROBIAL RENAL DOSAGE ADJUSTMENT  Current antimicrobial regimen includes a mismatch between antimicrobial dosage and estimated renal function.  As per policy approved by the Pharmacy & Therapeutics and Medical Executive Committees, the antimicrobial dosage will be adjusted accordingly.  Current antimicrobial dosage:  Meropenem 1g IV q12h  Indication: Intra-abdominal infection  Renal Function:  Estimated Creatinine Clearance: 58.9 mL/min (by C-G formula based on SCr of 0.81 mg/dL). []      On intermittent HD, scheduled: []      On CRRT    Antimicrobial dosage has been changed to:  Meropenem 1g IV q8h  Additional comments:   Thank you for allowing pharmacy to be a part of this patient's care.  Paulina Fusi, PharmD, BCPS 07/07/2019 3:45 PM

## 2019-07-07 NOTE — Progress Notes (Signed)
  Chaplain On-Call responded to page from [employer], who stated that the patient asked to see a Chaplain. Met patient in room and also a friend, Regulatory affairs officer. Patient and friend stated their request for Advance Directive information, and patient's intent to name Mr.Tuck as her Sigourney.  Chaplain provided AD documents to patient. Encouraged them to discuss and to ask further questions of the Physician and medical team about the plan of care. Chaplain described the process that is required to happen in order for documents to become Notarized.  Patient and friend will contact Chaplain when they are at the point of wanting to complete the documents.

## 2019-07-07 NOTE — Progress Notes (Signed)
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 She is s/pTAH and BSO on 11/26/2018. She was receiving adjuvant systemic chemotherapy.Patient underwent recent paracentesis with cytology positive for malignancy.  Patient was admitted to the hospital on 07/02/2019 with progressive weakness, decreased oral intake, and worsening abdominal pain.  KUB revealed pneumoperitoneum, which was confirmed on abdominal CT.  Subsequent KUB was concerning for development of high-grade SBO.  Surgery has evaluated and does not feel the patient is a surgical candidate.  She is being managed conservatively.  Palliative care was consulted help address goals.   CODE STATUS: DNR  PAST MEDICAL HISTORY: Past Medical History:  Diagnosis Date  . Allergy   . Anemia   . Complication of anesthesia   . Diabetes mellitus without complication ([place])   . Diverticulosis   . Family history of adverse reaction to anesthesia    mom-n/v  . Family history of lung cancer   . GERD (gastroesophageal reflux disease)    h/o  . Hyperlipidemia   . Hypothyroidism   . Ovarian cancer ([place]) 08/04/2018   Chemo tx's and Hysterectomy.  . Personal history of chemotherapy   . PONV (postoperative nausea and vomiting)    sick with tonsillectomy  . 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 Thyroid disease     PAST SURGICAL HISTORY:  Past Surgical History:  Procedure Laterality Date  . ABDOMINAL HYSTERECTOMY    . BACK SURGERY  2007   Lumbar Surgery  . BREAST CYST ASPIRATION Right    Around 10 years ago. Pt thinks it  was the right breast but not sure.  Marland Kitchen CATARACT EXTRACTION Left 2010   Right Eye 2011  . COLONOSCOPY WITH PROPOFOL N/A 09/02/2015   Procedure: COLONOSCOPY WITH PROPOFOL;  Surgeon: [name], MD;  Location: [facility];  Service: Endoscopy;  Laterality: N/A;  Diabetic - oral meds  . ESOPHAGOGASTRODUODENOSCOPY (EGD) WITH PROPOFOL N/A 11/03/2015   Procedure: ESOPHAGOGASTRODUODENOSCOPY (EGD) WITH PROPOFOL;  Surgeon: [name], MD;  Location: [facility];  Service: Endoscopy;  Laterality: N/A;  . EYE SURGERY Bilateral 06/2014    tear duck catherization ( Dr. Vallarie Mare)  . LAPAROSCOPIC APPENDECTOMY  11/26/2018   Procedure: APPENDECTOMY LAPAROSCOPIC;  Surgeon: Ward, Honor Loh, MD;  Location: [facility];  Service: Gynecology;;  . POLYPECTOMY  09/02/2015   Procedure: POLYPECTOMY;  Surgeon: [name], MD;  Location: [facility];  Service: Endoscopy;;  . TONSILLECTOMY  1963    HEMATOLOGY/ONCOLOGY HISTORY:  Oncology History  Serous adenocarcinoma ([place])  08/13/2018 Initial Diagnosis   Serous adenocarcinoma ([place])   08/14/2018 Cancer Staging   Staging form: Exocrine Pancreas, AJCC 8th Edition - Clinical stage from 08/14/2018: Stage III (cT3, cN2, cM0) - Signed by Sindy Guadeloupe, MD on 08/17/2018   08/21/2018 -  Chemotherapy   The patient had palonosetron (ALOXI) injection 0.25 mg, 0.25 mg, Intravenous,  Once, 6 of 6 cycles Administration: 0.25 mg (08/21/2018), 0.25 mg (09/11/2018), 0.25 mg (10/02/2018), 0.25 mg (10/23/2018), 0.25 mg (12/22/2018), 0.25 mg (01/12/2019) pegfilgrastim (NEULASTA ONPRO KIT) injection 6 mg, 6 mg, Subcutaneous, Once, 6 of 6 cycles Administration: 6 mg (08/21/2018), 6  mg (09/11/2018), 6 mg (10/02/2018), 6 mg (10/23/2018), 6 mg (12/22/2018), 6 mg (01/12/2019) bevacizumab (AVASTIN) 800 mg in sodium chloride 0.9 % 100 mL chemo infusion, 15 mg/kg = 800 mg (100 % of original dose 15 mg/kg), Intravenous,  Once, 1 of 1 cycle Dose modification: 15 mg/kg (original dose 15 mg/kg,  Cycle 1) Administration: 800 mg (08/21/2018) CARBOplatin (PARAPLATIN) 430 mg in sodium chloride 0.9 % 250 mL chemo infusion, 430 mg (100 % of original dose 429.5 mg), Intravenous,  Once, 6 of 6 cycles Dose modification:   (original dose 429.5 mg, Cycle 1) Administration: 430 mg (08/21/2018), 430 mg (09/11/2018), 430 mg (10/02/2018), 430 mg (10/23/2018), 390 mg (12/22/2018), 390 mg (01/12/2019) PACLitaxel (TAXOL) 270 mg in sodium chloride 0.9 % 250 mL chemo infusion (> '80mg'$ /m2), 175 mg/m2 = 270 mg, Intravenous,  Once, 6 of 6 cycles Dose modification: 160 mg/m2 (original dose 175 mg/m2, Cycle 5, Reason: Other (see comments), Comment: neuropathy) Administration: 270 mg (08/21/2018), 270 mg (09/11/2018), 270 mg (10/02/2018), 270 mg (10/23/2018), 246 mg (12/22/2018), 246 mg (01/12/2019) fosaprepitant (EMEND) 150 mg, dexamethasone (DECADRON) 12 mg in sodium chloride 0.9 % 145 mL IVPB, , Intravenous,  Once, 6 of 6 cycles Administration:  (08/21/2018),  (09/11/2018),  (10/02/2018),  (10/23/2018),  (12/22/2018),  (01/12/2019) bevacizumab-awwb (MVASI) 800 mg in sodium chloride 0.9 % 100 mL chemo infusion, 15 mg/kg = 800 mg (100 % of original dose 15 mg/kg), Intravenous,  Once, 10 of 11 cycles Dose modification: 15 mg/kg (original dose 15 mg/kg, Cycle 2) Administration: 800 mg (09/11/2018), 800 mg (10/02/2018), 800 mg (01/12/2019), 800 mg (02/02/2019), 800 mg (02/23/2019), 800 mg (03/16/2019), 800 mg (04/06/2019), 800 mg (04/27/2019), 800 mg (05/18/2019), 800 mg (06/08/2019)  for chemotherapy treatment.    11/07/2018 Genetic Testing   Negative germline + somatic genetic testing. No pathogenic variants identified on the [facility] panel and [facility] HRD. The [facility] gene panel offered by [employer] includes sequencing and deletion/duplication testing of the following 35 genes: APC, ATM, AXIN2, BARD1, BMPR1A, BRCA1, BRCA2, BRIP1, CHD1, CDK4, CDKN2A, CHEK2, EPCAM (large rearrangement only), HOXB13, GALNT12, MLH1,  MSH2, MSH3, MSH6, MUTYH, NBN, NTHL1, PALB2, PMS2, PTEN, RAD51C, RAD51D, RNF43, RPS20, SMAD4, STK11, and TP53. Sequencing was performed for select regions of POLE and POLD1, and large rearrangement analysis was performed for select regions of GREM1. The report date is 11/07/2018.    Bilateral primary ovarian cancer ([place])  02/02/2019 Initial Diagnosis   Bilateral primary ovarian cancer ([facility])     ALLERGIES:  is allergic to clarithromycin; doxycycline; and penicillins.  MEDICATIONS:  Current Facility-Administered Medications  Medication Dose Route Frequency Provider Last Rate Last Admin  . insulin aspart (novoLOG) injection 0-9 Units  0-9 Units Subcutaneous Q4H Lavina Hamman, MD   1 Units at 07/07/19 0047  . lactated ringers infusion   Intravenous Continuous Lavina Hamman, MD 75 mL/hr at 07/07/19 0448 New Bag at 07/07/19 0448  . levothyroxine (SYNTHROID, LEVOTHROID) injection 50 mcg  50 mcg Intravenous Daily Lavina Hamman, MD   50 mcg at 07/07/19 0846  . meropenem (MERREM) 1 g in sodium chloride 0.9 % 100 mL IVPB  1 g Intravenous Q12H Lavina Hamman, MD 200 mL/hr at 07/06/19 2126 1 g at 07/06/19 2126  . octreotide (SANDOSTATIN) injection 100 mcg  100 mcg Subcutaneous Q8H Sindy Guadeloupe, MD   100 mcg at 07/07/19 0517  . pantoprazole (PROTONIX) injection 40 mg  40 mg Intravenous Q24H Lavina Hamman, MD   40 mg at  07/06/19 1653   Facility-Administered Medications Ordered in Other Encounters  Medication Dose Route Frequency Provider Last Rate Last Admin  . sodium chloride flush (NS) 0.9 % injection 10 mL  10 mL Intravenous Once Sindy Guadeloupe, MD      . sodium chloride flush (NS) 0.9 % injection 10 mL  10 mL Intravenous Once Sindy Guadeloupe, MD      . sodium chloride flush (NS) 0.9 % injection 10 mL  10 mL Intravenous Once Sindy Guadeloupe, MD        VITAL SIGNS: BP 119/67 (BP Location: Left Arm)   Pulse 71   Temp 97.7 F (36.5 C)   Resp 16   Ht '5\' 4"'$  (1.626 m)   Wt 117 lb 4.6 oz (53.2  kg)   SpO2 98%   BMI 20.13 kg/m  Filed Weights   07/04/19 1628 07/05/19 1057  Weight: 119 lb 8 oz (54.2 kg) 117 lb 4.6 oz (53.2 kg)    Estimated body mass index is 20.13 kg/m as calculated from the following:   Height as of this encounter: '5\' 4"'$  (1.626 m).   Weight as of this encounter: 117 lb 4.6 oz (53.2 kg).  LABS: CBC:    Component Value Date/Time   WBC 6.8 07/07/2019 0748   HGB 9.3 (L) 07/07/2019 0748   HGB 10.2 (L) 05/31/2017 0836   HCT 27.7 (L) 07/07/2019 0748   HCT 31.4 (L) 05/31/2017 0836   PLT 137 (L) 07/07/2019 0748   PLT 374 05/31/2017 0836   MCV 101.5 (H) 07/07/2019 0748   MCV 100 (H) 05/31/2017 0836   NEUTROABS 5.6 07/07/2019 0748   NEUTROABS 3.4 05/31/2017 0836   LYMPHSABS 0.7 07/07/2019 0748   LYMPHSABS 1.0 05/31/2017 0836   MONOABS 0.3 07/07/2019 0748   EOSABS 0.0 07/07/2019 0748   EOSABS 0.4 05/31/2017 0836   BASOSABS 0.0 07/07/2019 0748   BASOSABS 0.0 05/31/2017 0836   Comprehensive Metabolic Panel:    Component Value Date/Time   NA 137 07/07/2019 0752   NA 142 05/31/2017 0836   K 4.5 07/07/2019 0752   CL 106 07/07/2019 0752   CO2 22 07/07/2019 0752   BUN 36 (H) 07/07/2019 0752   BUN 9 05/31/2017 0836   CREATININE 0.81 07/07/2019 0752   CREATININE 0.57 11/13/2016 0933   GLUCOSE 141 (H) 07/07/2019 0752   CALCIUM 7.7 (L) 07/07/2019 0752   AST 16 07/07/2019 0752   ALT 12 07/07/2019 0752   ALKPHOS 85 07/07/2019 0752   BILITOT 0.9 07/07/2019 0752   BILITOT <0.2 05/31/2017 0836   PROT 5.4 (L) 07/07/2019 0752   PROT 6.1 05/31/2017 0836   ALBUMIN 1.7 (L) 07/07/2019 0752   ALBUMIN 3.4 (L) 05/31/2017 0836    RADIOGRAPHIC STUDIES: DG Lumbar Spine 2-3 Views  Result Date: 07/06/2019 CLINICAL DATA:  Back pain EXAM: LUMBAR SPINE - 2-3 VIEW COMPARISON:  Abdominal radiographs-earlier same day; CT abdomen and pelvis-07/05/2019 FINDINGS: There are 6 non rib-bearing lumbar type vertebral bodies. For the purposes of this dictation, lumbar levels will be  labeled L1 through L6. Grade 1 anterolisthesis of L4 upon L5 measuring approximately 3 mm. No definite pars defects Lumbar vertebral body heights appear preserved Mild to moderate multilevel lumbar spine DDD, worse at L5-L6 with disc space height loss, endplate irregularity and sclerosis Redemonstrated marked gaseous distention of multiple loops of small bowel. Small amount of pneumoperitoneum is seen within the upper abdomen. Regional soft tissues appear normal. IMPRESSION: 1. Transitional anatomy with spinal labeling as  above. 2. Mild to moderate multilevel lumbar spine DDD, worse at L5-L6. Electronically Signed   By: Sandi Mariscal M.D.   On: 07/06/2019 08:01   DG Abd 1 View  Result Date: 07/06/2019 CLINICAL DATA:  Check gastric catheter placement EXAM: ABDOMEN - 1 VIEW COMPARISON:  Film from earlier in the same day. FINDINGS: Persistent small bowel obstructive change is noted stable from the prior examination. Gastric catheter has been advanced with the tip in the stomach although the proximal side port lies in the distal esophagus and should be advanced several cm. Increase in the degree of free air is noted beneath the diaphragms. IMPRESSION: Gastric catheter with the tip in the stomach. This should be advanced several cm further into the stomach. Persistent small bowel obstructive change. Increasing pneumoperitoneum. These results will be called to the ordering clinician or representative by the Radiologist Assistant, and communication documented in the PACS or [employer]. Electronically Signed   By: Inez Catalina M.D.   On: 07/06/2019 13:26   CT HEAD WO CONTRAST  Result Date: 07/06/2019 CLINICAL DATA:  [place], altered level of consciousness EXAM: CT HEAD WITHOUT CONTRAST TECHNIQUE: Contiguous axial images were obtained from the base of the skull through the vertex without intravenous contrast. COMPARISON:  None. FINDINGS: Brain: No acute infarct or hemorrhage. Lateral ventricles and midline  structures are unremarkable. No acute extra-axial fluid collections. No mass effect. Vascular: No hyperdense vessel or unexpected calcification. Skull: Normal. Negative for fracture or focal lesion. Sinuses/Orbits: No acute finding. Other: None. IMPRESSION: 1. No acute intracranial process. Electronically Signed   By: Randa Ngo M.D.   On: 07/06/2019 20:56   CT ABDOMEN PELVIS W CONTRAST  Result Date: 07/05/2019 CLINICAL DATA:  History of ovarian cancer, free air in the abdomen on prior radiographs EXAM: CT ABDOMEN AND PELVIS WITH CONTRAST TECHNIQUE: Multidetector CT imaging of the abdomen and pelvis was performed using the standard protocol following bolus administration of intravenous contrast. CONTRAST:  16m OMNIPAQUE IOHEXOL 300 MG/ML  SOLN COMPARISON:  Same-day abdominal radiographs, CT abdomen pelvis, 06/01/2019 FINDINGS: Lower chest: No acute abnormality. Hepatobiliary: No solid liver abnormality is seen. No gallstones, gallbladder wall thickening, or biliary dilatation. Pancreas: Unremarkable. No pancreatic ductal dilatation or surrounding inflammatory changes. Spleen: Normal in size without significant abnormality. Adrenals/Urinary Tract: Adrenal glands are unremarkable. Kidneys are normal, without renal calculi, solid lesion, or hydronephrosis. Bladder is unremarkable. Stomach/Bowel: Stomach is within normal limits. The small bowel is diffusely fluid-filled, somewhat thickened and hyperenhancing although not overtly distended. The colon is generally decompressed with scattered gas present to the rectum. Sigmoid diverticulosis. Vascular/Lymphatic: Aortic atherosclerosis. No enlarged abdominal or pelvic lymph nodes. Reproductive: Status post hysterectomy and oophorectomy. Other: No abdominal wall hernia or abnormality. Anasarca. Small volume pneumoperitoneum. Small volume ascites throughout the abdomen and pelvis. There are minimal areas of peritoneal thickening and nodularity, example in the right  paracolic gutter, consistent with peritoneal metastatic disease. Musculoskeletal: No acute or significant osseous findings. IMPRESSION: 1. Small volume pneumoperitoneum, in keeping with findings of prior radiographs. 2. The small bowel is diffusely fluid-filled, somewhat thickened and hyperenhancing, although not overtly distended. There is no pneumatosis, portal venous gas, or other specific findings to localize source of pneumoperitoneum. 3. 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 4.  Diverticulosis without evidence of acute diverticulitis. 5.  Aortic Atherosclerosis (ICD10-I70.0). Findings discussed by telephone with Dr. PDahlia Byesat the time of interpretation. Electronically Signed  By: Eddie Candle M.D.   On: 07/05/2019 17:32   US RENAL  Result Date: 07/05/2019 CLINICAL DATA:  Acute renal injury, history of ovarian carcinoma EXAM: RENAL / URINARY TRACT ULTRASOUND COMPLETE COMPARISON:  None. FINDINGS: Right Kidney: Renal measurements: 9.9 x 3.8 x 5.2 cm. = volume: 104 mL. Mild increased echogenicity is noted. No mass lesion or hydronephrosis is seen. Left Kidney: Renal measurements: 9.6 x 4.9 x 5.1 cm. = volume: 128 mL. Increased echogenicity is noted. No mass lesion or hydronephrosis is noted. Bladder: Partially decompressed Other: None. IMPRESSION: Mild increased echogenicity consistent with medical renal disease. No obstructive changes are noted. Electronically Signed   By: Inez Catalina M.D.   On: 07/05/2019 13:11   US Abdomen Limited  Result Date: 07/05/2019 CLINICAL DATA:  Abdominal distension, possible ascites EXAM: LIMITED ABDOMEN ULTRASOUND FOR ASCITES TECHNIQUE: Limited ultrasound survey for ascites was performed in all four abdominal quadrants. COMPARISON:  None. FINDINGS: Minimal ascites is noted. Previously seen plain film showed significant small bowel dilatation.  IMPRESSION: Minimal ascites. This is not the etiology of the patient's abdominal distension. CT of the abdomen and pelvis is again recommended for further evaluation. Electronically Signed   By: Inez Catalina M.D.   On: 07/05/2019 13:12   US Paracentesis  Result Date: 06/30/2019 INDICATION: Ovarian cancer with ascites. Request for diagnostic and therapeutic paracentesis. EXAM: ULTRASOUND GUIDED PARACENTESIS MEDICATIONS: 1% lidocaine 10 mL COMPLICATIONS: None immediate. PROCEDURE: Informed written consent was obtained from the patient after a discussion of the risks, benefits and alternatives to treatment. A timeout was performed prior to the initiation of the procedure. Initial ultrasound scanning demonstrates a moderate amount of ascites within the right lower abdominal quadrant. The right lower abdomen was prepped and draped in the usual sterile fashion. 1% lidocaine was used for local anesthesia. Following this, a 6 Fr Safe-T-Centesis catheter was introduced. An ultrasound image was saved for documentation purposes. The paracentesis was performed. The catheter was removed and a dressing was applied. The patient tolerated the procedure well without immediate post procedural complication. FINDINGS: A total of approximately 1 L of clear yellow fluid was removed. Samples were sent to the laboratory as requested by the clinical team. IMPRESSION: Successful ultrasound-guided paracentesis yielding 1 liter of peritoneal fluid. Read by: Gareth Eagle, PA-C Electronically Signed   By: Marcello Moores  Register   On: 06/30/2019 11:21   DG ABD ACUTE 2+V W 1V CHEST  Result Date: 07/06/2019 CLINICAL DATA:  Small-bowel obstruction and pneumoperitoneum. EXAM: DG ABDOMEN ACUTE W/ 1V CHEST COMPARISON:  07/05/2019; CT abdomen and pelvis-07/05/2019 FINDINGS: Redemonstrated marked gaseous distention of multiple loops of small bowel with index loop of small bowel within the right mid hemiabdomen measuring 7.6 cm in diameter. This finding  is again associated with a conspicuous paucity of distal colonic gas. Small amount of pneumoperitoneum is seen within the upper abdomen, likely reduced in volume. No pneumatosis or portal venous gas Limited visualization of lower thorax demonstrates sequela of previous paraspinal fusion and corpectomy, incompletely evaluated. Degenerative change the lower lumbar spine is suspected though incompletely evaluated. IMPRESSION: 1. Similar findings worrisome for high-grade small bowel obstruction. 2. Small amount of pneumoperitoneum, likely reduced in volume compared to the 07/05/2019 examination. Electronically Signed   By: Sandi Mariscal M.D.   On: 07/06/2019 07:58   DG Abd Portable 1V  Result Date: 07/07/2019 CLINICAL DATA:  Small bowel obstruction.  Pneumoperitoneum EXAM: PORTABLE ABDOMEN - 1 VIEW COMPARISON:  CT from 2 days ago FINDINGS: Continued small bowel  obstruction with diffusely gas dilated bowel and thickened appearance of folds. Small volume pneumoperitoneum is likely seen below the right liver. The patient is supine, limiting direct comparison with yesterday. IMPRESSION: 1. Ongoing small bowel obstruction. 2. Supine positioning limits comparison with yesterday. Electronically Signed   By: Monte Fantasia M.D.   On: 07/07/2019 08:47   DG Abd Portable 1V  Result Date: 07/05/2019 CLINICAL DATA:  Abdominal distension EXAM: PORTABLE ABDOMEN - 1 VIEW COMPARISON:  06/01/2019 FINDINGS: Scattered large and small bowel gas is noted. Significant small bowel dilatation is noted to 7 cm. There are changes suspicious for free intraperitoneal air with regular sign and right upper quadrant air identified. CT of the abdomen and pelvis is recommended for further evaluation. No bony abnormality is seen. IMPRESSION: Significant dilatation of the small bowel with evidence of free air within the abdomen. CT of the abdomen and pelvis is recommended for further evaluation. Electronically Signed   By: Inez Catalina M.D.   On:  07/05/2019 09:16    PERFORMANCE STATUS (ECOG) : 3 - Symptomatic, >50% confined to bed  Review of Systems Unless otherwise noted, a complete review of systems is negative.  Physical Exam General: NAD, thin, frail-appearing Pulmonary: Unlabored Abdomen: Distended, tympanic Skin: no rashes Neurological: Weakness but otherwise nonfocal  IMPRESSION: Patient currently working with PT.  She is comfortable appearing.  She says that she occasionally has some abdominal pain but denies other distressing symptoms such as nausea or vomiting.  It appears that an NGT was placed yesterday but patient reported.  Per surgical notes, NGT is going to be retried today for gastric decompression.  I met with patient significant other, Johnny.  Updated him on patient's status and plan.  He is in agreement with the current scope of treatment.  He would like to complete ACP documents today if possible.  We will consult the chaplain to assist with this.  PLAN: -Continue current scope of treatment -Chaplain to assist with establishing ACP documents -Will follow   Time Total: 20 minutes  Visit consisted of counseling and education dealing with the complex and emotionally intense issues of symptom management and palliative care in the setting of serious and potentially life-threatening illness.Greater than 50%  of this time was spent counseling and coordinating care related to the above assessment and plan.  Signed by: Altha Harm, PhD, NP-C

## 2019-07-07 NOTE — Plan of Care (Signed)
  Problem: Education: Goal: Knowledge of General Education information will improve Description: Including pain rating scale, medication(s)/side effects and non-pharmacologic comfort measures Outcome: Progressing   Problem: Clinical Measurements: Goal: Respiratory complications will improve Outcome: Progressing   Problem: Coping: Goal: Level of anxiety will decrease Outcome: Progressing   Problem: Health Behavior/Discharge Planning: Goal: Ability to manage health-related needs will improve Outcome: Not Progressing   Problem: Activity: Goal: Risk for activity intolerance will decrease Outcome: Not Progressing Note: Patient continues with weakness this shift and in need of assistance with all ADLs

## 2019-07-08 ENCOUNTER — Inpatient Hospital Stay: Payer: Commercial Managed Care - PPO

## 2019-07-08 DIAGNOSIS — R1030 Lower abdominal pain, unspecified: Secondary | ICD-10-CM | POA: Diagnosis not present

## 2019-07-08 DIAGNOSIS — Z515 Encounter for palliative care: Secondary | ICD-10-CM | POA: Diagnosis not present

## 2019-07-08 DIAGNOSIS — N179 Acute kidney failure, unspecified: Secondary | ICD-10-CM | POA: Diagnosis not present

## 2019-07-08 LAB — CBC WITH DIFFERENTIAL/PLATELET
Abs Immature Granulocytes: 0.2 10*3/uL — ABNORMAL HIGH (ref 0.00–0.07)
Basophils Absolute: 0 10*3/uL (ref 0.0–0.1)
Basophils Relative: 0 %
Eosinophils Absolute: 0 10*3/uL (ref 0.0–0.5)
Eosinophils Relative: 0 %
HCT: 29.5 % — ABNORMAL LOW (ref 36.0–46.0)
Hemoglobin: 9.7 g/dL — ABNORMAL LOW (ref 12.0–15.0)
Immature Granulocytes: 4 %
Lymphocytes Relative: 15 %
Lymphs Abs: 0.8 10*3/uL (ref 0.7–4.0)
MCH: 33.9 pg (ref 26.0–34.0)
MCHC: 32.9 g/dL (ref 30.0–36.0)
MCV: 103.1 fL — ABNORMAL HIGH (ref 80.0–100.0)
Monocytes Absolute: 0.3 10*3/uL (ref 0.1–1.0)
Monocytes Relative: 5 %
Neutro Abs: 4.1 10*3/uL (ref 1.7–7.7)
Neutrophils Relative %: 76 %
Platelets: 138 10*3/uL — ABNORMAL LOW (ref 150–400)
RBC: 2.86 MIL/uL — ABNORMAL LOW (ref 3.87–5.11)
RDW: 13.9 % (ref 11.5–15.5)
WBC: 5.3 10*3/uL (ref 4.0–10.5)
nRBC: 0 % (ref 0.0–0.2)

## 2019-07-08 LAB — COMPREHENSIVE METABOLIC PANEL
ALT: 13 U/L (ref 0–44)
AST: 17 U/L (ref 15–41)
Albumin: 1.8 g/dL — ABNORMAL LOW (ref 3.5–5.0)
Alkaline Phosphatase: 75 U/L (ref 38–126)
Anion gap: 9 (ref 5–15)
BUN: 27 mg/dL — ABNORMAL HIGH (ref 8–23)
CO2: 23 mmol/L (ref 22–32)
Calcium: 7.8 mg/dL — ABNORMAL LOW (ref 8.9–10.3)
Chloride: 107 mmol/L (ref 98–111)
Creatinine, Ser: 0.67 mg/dL (ref 0.44–1.00)
GFR calc Af Amer: >60 mL/min (ref >60)
GFR calc non Af Amer: >60 mL/min (ref >60)
Glucose, Bld: 129 mg/dL — ABNORMAL HIGH (ref 70–99)
Potassium: 3.9 mmol/L (ref 3.5–5.1)
Sodium: 139 mmol/L (ref 135–145)
Total Bilirubin: 1.2 mg/dL (ref 0.3–1.2)
Total Protein: 5.4 g/dL — ABNORMAL LOW (ref 6.5–8.1)

## 2019-07-08 LAB — GLUCOSE, CAPILLARY
Glucose-Capillary: 114 mg/dL — ABNORMAL HIGH (ref 70–99)
Glucose-Capillary: 105 mg/dL — ABNORMAL HIGH (ref 70–99)
Glucose-Capillary: 101 mg/dL — ABNORMAL HIGH (ref 70–99)
Glucose-Capillary: 132 mg/dL — ABNORMAL HIGH (ref 70–99)
Glucose-Capillary: 147 mg/dL — ABNORMAL HIGH (ref 70–99)

## 2019-07-08 LAB — MAGNESIUM: Magnesium: 1.9 mg/dL (ref 1.7–2.4)

## 2019-07-08 MED ORDER — ONDANSETRON HCL 4 MG/2ML IJ SOLN
4.0000 mg | Freq: Four times a day (QID) | INTRAMUSCULAR | Status: DC
  Administered 2019-07-08 – 2019-07-11 (×14): 4 mg via INTRAVENOUS
  Filled 2019-07-08 (×14): qty 2

## 2019-07-08 MED ORDER — HALOPERIDOL LACTATE 5 MG/ML IJ SOLN: 1.0000 mg | Freq: Four times a day (QID) | INTRAMUSCULAR | Status: DC | PRN

## 2019-07-08 MED ORDER — MORPHINE SULFATE 2 MG/ML IJ SOLN
2.0000 mg | INTRAMUSCULAR | Status: DC | PRN
  Administered 2019-07-09 – 2019-07-11 (×8): 2 mg via INTRAVENOUS
  Filled 2019-07-08 (×8): qty 1

## 2019-07-08 MED ORDER — LORAZEPAM 2 MG/ML IJ SOLN: 1.0000 mg | INTRAMUSCULAR | Status: DC | PRN

## 2019-07-08 MED ORDER — HALOPERIDOL LACTATE 5 MG/ML IJ SOLN: 2.0000 mg | Freq: Four times a day (QID) | INTRAMUSCULAR | Status: DC | PRN

## 2019-07-08 NOTE — Progress Notes (Signed)
Brief Progress Note Patient has elected to focus on comfort measures only and end of life care. She is being followed by palliative care. No surgical needs, we will sign off to not disturb patient.   -- Edison Simon, PA-C [facility] 07/08/2019, 9:38 AM [phone] M-F: 7am - 4pm

## 2019-07-08 NOTE — TOC Progression Note (Signed)
Transition of Care [facility]) - Progression Note    Patient Details  Name: Meredith Jones MRN: [id number] Date of Birth: 09/25/1955  [facility]) CM/SW Contact  Roper Tolson, Gardiner Rhyme, LCSW Phone Number: 07/08/2019, 10:34 AM  Clinical Narrative:  Met with pt to discuss her choice, she has decided on comfort measures and would like to pursue hospice home. She chooses [facility] for this. She does want to continue to take her medications and at times seems confused regarding comfort and hospice home. Josh-Palliative to speak with this am. Will reach out to tracy or karen with [facility]    Expected Discharge Plan: [place] Barriers to Discharge: Continued Medical Work up  Expected Discharge Plan and Services Expected Discharge Plan: [place] In-house Referral: Clinical Social Work, Clinical biochemist   Post Acute Care Choice: Home Health, Durable Medical Equipment Living arrangements for the past 2 months: Single Family Home                                       Social Determinants of Health (SDOH) Interventions    Readmission Risk Interventions No flowsheet data found.

## 2019-07-08 NOTE — Plan of Care (Signed)
  Problem: Clinical Measurements: Goal: Respiratory complications will improve Outcome: Progressing Goal: Cardiovascular complication will be avoided Outcome: Progressing   Problem: Coping: Goal: Level of anxiety will decrease Outcome: Progressing   Problem: Nutrition: Goal: Adequate nutrition will be maintained Outcome: Not Progressing

## 2019-07-08 NOTE — Progress Notes (Signed)
[place] received page from [facility]; Pt./family was told earlier today that AD needed to be completed @ hospice home, but this info not accurate --> Hospice requests pt. complete AD in hospital in case of change in pt.'s status.  Hospice RN said AD is complete --> only need notarization.  [place] found witnesses and notary from [facility] and arranged meeting at pt.'s rm.; informed pt./family of status of process of getting required parties.  [place] passed case to [facility] who had previous rapport w/family; [place] returned shortly after to assist [facility] in securing alternate notary since previous notary did not have stamp.  Family somewhat frustrated by length of process and erroneous info given initially re: AD, but were grateful for chaplain support overall.     07/08/19 1500  Clinical Encounter Type  Visited With Patient and family together;Health care provider  Visit Type Initial;Psychological support;Social support (Advanced Directive completion)  Referral From Nurse;Other (Comment);Care management [facility] Coordinator)  Consult/Referral To Chaplain  Stress Factors  Patient Stress Factors Health changes (Needed AD notarization)

## 2019-07-08 NOTE — Progress Notes (Signed)
PT Cancellation Note  Patient Details Name: Meredith Jones MRN: [id number] DOB: 02-25-1955   Cancelled Treatment:     Pt is now comfort measures. PT will sign off. Please re-consult if any changes occur to care plan. Thanks for letting us be involve in this pt's care.    Willette Pa 07/08/2019, 1:23 PM

## 2019-07-08 NOTE — Progress Notes (Signed)
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 She is s/pTAH and BSO on 11/26/2018. She was receiving adjuvant systemic chemotherapy.Patient underwent recent paracentesis with cytology positive for malignancy.  Patient was admitted to the hospital on 07/02/2019 with progressive weakness, decreased oral intake, and worsening abdominal pain.  KUB revealed pneumoperitoneum, which was confirmed on abdominal CT.  Subsequent KUB was concerning for development of high-grade SBO.  Surgery has evaluated and does not feel the patient is a surgical candidate.  She is being managed conservatively.  Palliative care was consulted help address goals.   CODE STATUS: DNR  PAST MEDICAL HISTORY: Past Medical History:  Diagnosis Date  . Allergy   . Anemia   . Complication of anesthesia   . Diabetes mellitus without complication ([place])   . Diverticulosis   . Family history of adverse reaction to anesthesia    mom-n/v  . Family history of lung cancer   . GERD (gastroesophageal reflux disease)    h/o  . Hyperlipidemia   . Hypothyroidism   . Ovarian cancer ([place]) 08/04/2018   Chemo tx's and Hysterectomy.  . Personal history of chemotherapy   . PONV (postoperative nausea and vomiting)    sick with tonsillectomy  . 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 Thyroid disease     PAST SURGICAL HISTORY:  Past Surgical History:  Procedure Laterality Date  . ABDOMINAL HYSTERECTOMY    . BACK SURGERY  2007   Lumbar Surgery  . BREAST CYST ASPIRATION Right    Around 10 years ago. Pt thinks it  was the right breast but not sure.  Marland Kitchen CATARACT EXTRACTION Left 2010   Right Eye 2011  . COLONOSCOPY WITH PROPOFOL N/A 09/02/2015   Procedure: COLONOSCOPY WITH PROPOFOL;  Surgeon: [name], MD;  Location: [facility];  Service: Endoscopy;  Laterality: N/A;  Diabetic - oral meds  . ESOPHAGOGASTRODUODENOSCOPY (EGD) WITH PROPOFOL N/A 11/03/2015   Procedure: ESOPHAGOGASTRODUODENOSCOPY (EGD) WITH PROPOFOL;  Surgeon: [name], MD;  Location: [facility];  Service: Endoscopy;  Laterality: N/A;  . EYE SURGERY Bilateral 06/2014    tear duck catherization ( Dr. Vallarie Mare)  . LAPAROSCOPIC APPENDECTOMY  11/26/2018   Procedure: APPENDECTOMY LAPAROSCOPIC;  Surgeon: Ward, Honor Loh, MD;  Location: [facility];  Service: Gynecology;;  . POLYPECTOMY  09/02/2015   Procedure: POLYPECTOMY;  Surgeon: [name], MD;  Location: [facility];  Service: Endoscopy;;  . TONSILLECTOMY  1963    HEMATOLOGY/ONCOLOGY HISTORY:  Oncology History  Serous adenocarcinoma ([place])  08/13/2018 Initial Diagnosis   Serous adenocarcinoma ([place])   08/14/2018 Cancer Staging   Staging form: Exocrine Pancreas, AJCC 8th Edition - Clinical stage from 08/14/2018: Stage III (cT3, cN2, cM0) - Signed by Sindy Guadeloupe, MD on 08/17/2018   08/21/2018 -  Chemotherapy   The patient had palonosetron (ALOXI) injection 0.25 mg, 0.25 mg, Intravenous,  Once, 6 of 6 cycles Administration: 0.25 mg (08/21/2018), 0.25 mg (09/11/2018), 0.25 mg (10/02/2018), 0.25 mg (10/23/2018), 0.25 mg (12/22/2018), 0.25 mg (01/12/2019) pegfilgrastim (NEULASTA ONPRO KIT) injection 6 mg, 6 mg, Subcutaneous, Once, 6 of 6 cycles Administration: 6 mg (08/21/2018), 6  mg (09/11/2018), 6 mg (10/02/2018), 6 mg (10/23/2018), 6 mg (12/22/2018), 6 mg (01/12/2019) bevacizumab (AVASTIN) 800 mg in sodium chloride 0.9 % 100 mL chemo infusion, 15 mg/kg = 800 mg (100 % of original dose 15 mg/kg), Intravenous,  Once, 1 of 1 cycle Dose modification: 15 mg/kg (original dose 15 mg/kg,  Cycle 1) Administration: 800 mg (08/21/2018) CARBOplatin (PARAPLATIN) 430 mg in sodium chloride 0.9 % 250 mL chemo infusion, 430 mg (100 % of original dose 429.5 mg), Intravenous,  Once, 6 of 6 cycles Dose modification:   (original dose 429.5 mg, Cycle 1) Administration: 430 mg (08/21/2018), 430 mg (09/11/2018), 430 mg (10/02/2018), 430 mg (10/23/2018), 390 mg (12/22/2018), 390 mg (01/12/2019) PACLitaxel (TAXOL) 270 mg in sodium chloride 0.9 % 250 mL chemo infusion (> '80mg'$ /m2), 175 mg/m2 = 270 mg, Intravenous,  Once, 6 of 6 cycles Dose modification: 160 mg/m2 (original dose 175 mg/m2, Cycle 5, Reason: Other (see comments), Comment: neuropathy) Administration: 270 mg (08/21/2018), 270 mg (09/11/2018), 270 mg (10/02/2018), 270 mg (10/23/2018), 246 mg (12/22/2018), 246 mg (01/12/2019) fosaprepitant (EMEND) 150 mg, dexamethasone (DECADRON) 12 mg in sodium chloride 0.9 % 145 mL IVPB, , Intravenous,  Once, 6 of 6 cycles Administration:  (08/21/2018),  (09/11/2018),  (10/02/2018),  (10/23/2018),  (12/22/2018),  (01/12/2019) bevacizumab-awwb (MVASI) 800 mg in sodium chloride 0.9 % 100 mL chemo infusion, 15 mg/kg = 800 mg (100 % of original dose 15 mg/kg), Intravenous,  Once, 10 of 11 cycles Dose modification: 15 mg/kg (original dose 15 mg/kg, Cycle 2) Administration: 800 mg (09/11/2018), 800 mg (10/02/2018), 800 mg (01/12/2019), 800 mg (02/02/2019), 800 mg (02/23/2019), 800 mg (03/16/2019), 800 mg (04/06/2019), 800 mg (04/27/2019), 800 mg (05/18/2019), 800 mg (06/08/2019)  for chemotherapy treatment.    11/07/2018 Genetic Testing   Negative germline + somatic genetic testing. No pathogenic variants identified on the [facility] panel and [facility] HRD. The [facility] gene panel offered by [employer] includes sequencing and deletion/duplication testing of the following 35 genes: APC, ATM, AXIN2, BARD1, BMPR1A, BRCA1, BRCA2, BRIP1, CHD1, CDK4, CDKN2A, CHEK2, EPCAM (large rearrangement only), HOXB13, GALNT12, MLH1,  MSH2, MSH3, MSH6, MUTYH, NBN, NTHL1, PALB2, PMS2, PTEN, RAD51C, RAD51D, RNF43, RPS20, SMAD4, STK11, and TP53. Sequencing was performed for select regions of POLE and POLD1, and large rearrangement analysis was performed for select regions of GREM1. The report date is 11/07/2018.    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  MEDICATIONS:  Current Facility-Administered Medications  Medication Dose Route Frequency Provider Last Rate Last Admin  . insulin aspart (novoLOG) injection 0-9 Units  0-9 Units Subcutaneous Q4H Lavina Hamman, MD   1 Units at 07/07/19 0047  . lactated ringers infusion   Intravenous Continuous Lavina Hamman, MD 75 mL/hr at 07/08/19 0820 New Bag at 07/08/19 0820  . levothyroxine (SYNTHROID, LEVOTHROID) injection 50 mcg  50 mcg Intravenous Daily Lavina Hamman, MD   50 mcg at 07/08/19 0819  . meropenem (MERREM) 1 g in sodium chloride 0.9 % 100 mL IVPB  1 g Intravenous Q8H Lavina Hamman, MD 200 mL/hr at 07/08/19 0626 1 g at 07/08/19 0626  . octreotide (SANDOSTATIN) injection 100 mcg  100 mcg Subcutaneous Q8H Sindy Guadeloupe, MD   100 mcg at 07/08/19 [phone]  . pantoprazole (PROTONIX) injection 40 mg  40 mg Intravenous Q24H Lavina Hamman, MD   40 mg at  07/07/19 1333   Facility-Administered Medications Ordered in Other Encounters  Medication Dose Route Frequency Provider Last Rate Last Admin  . sodium chloride flush (NS) 0.9 % injection 10 mL  10 mL Intravenous Once Sindy Guadeloupe, MD      . sodium chloride flush (NS) 0.9 % injection 10 mL  10 mL Intravenous Once Sindy Guadeloupe, MD      . sodium chloride flush (NS) 0.9 % injection 10 mL  10 mL Intravenous Once Sindy Guadeloupe, MD        VITAL SIGNS: BP 112/76 (BP Location: Right Arm)   Pulse 82   Temp 97.6 F (36.4 C) (Oral)   Resp 20   Ht '5\' 4"'$  (1.626 m)   Wt 117 lb 4.6 oz  (53.2 kg)   SpO2 100%   BMI 20.13 kg/m  Filed Weights   07/04/19 1628 07/05/19 1057  Weight: 119 lb 8 oz (54.2 kg) 117 lb 4.6 oz (53.2 kg)    Estimated body mass index is 20.13 kg/m as calculated from the following:   Height as of this encounter: '5\' 4"'$  (1.626 m).   Weight as of this encounter: 117 lb 4.6 oz (53.2 kg).  LABS: CBC:    Component Value Date/Time   WBC 5.3 07/08/2019 0505   HGB 9.7 (L) 07/08/2019 0505   HGB 10.2 (L) 05/31/2017 0836   HCT 29.5 (L) 07/08/2019 0505   HCT 31.4 (L) 05/31/2017 0836   PLT 138 (L) 07/08/2019 0505   PLT 374 05/31/2017 0836   MCV 103.1 (H) 07/08/2019 0505   MCV 100 (H) 05/31/2017 0836   NEUTROABS 4.1 07/08/2019 0505   NEUTROABS 3.4 05/31/2017 0836   LYMPHSABS 0.8 07/08/2019 0505   LYMPHSABS 1.0 05/31/2017 0836   MONOABS 0.3 07/08/2019 0505   EOSABS 0.0 07/08/2019 0505   EOSABS 0.4 05/31/2017 0836   BASOSABS 0.0 07/08/2019 0505   BASOSABS 0.0 05/31/2017 0836   Comprehensive Metabolic Panel:    Component Value Date/Time   NA 139 07/08/2019 0505   NA 142 05/31/2017 0836   K 3.9 07/08/2019 0505   CL 107 07/08/2019 0505   CO2 23 07/08/2019 0505   BUN 27 (H) 07/08/2019 0505   BUN 9 05/31/2017 0836   CREATININE 0.67 07/08/2019 0505   CREATININE 0.57 11/13/2016 0933   GLUCOSE 129 (H) 07/08/2019 0505   CALCIUM 7.8 (L) 07/08/2019 0505   AST 17 07/08/2019 0505   ALT 13 07/08/2019 0505   ALKPHOS 75 07/08/2019 0505   BILITOT 1.2 07/08/2019 0505   BILITOT <0.2 05/31/2017 0836   PROT 5.4 (L) 07/08/2019 0505   PROT 6.1 05/31/2017 0836   ALBUMIN 1.8 (L) 07/08/2019 0505   ALBUMIN 3.4 (L) 05/31/2017 0836    RADIOGRAPHIC STUDIES: DG Lumbar Spine 2-3 Views  Result Date: 07/06/2019 CLINICAL DATA:  Back pain EXAM: LUMBAR SPINE - 2-3 VIEW COMPARISON:  Abdominal radiographs-earlier same day; CT abdomen and pelvis-07/05/2019 FINDINGS: There are 6 non rib-bearing lumbar type vertebral bodies. For the purposes of this dictation, lumbar levels  will be labeled L1 through L6. Grade 1 anterolisthesis of L4 upon L5 measuring approximately 3 mm. No definite pars defects Lumbar vertebral body heights appear preserved Mild to moderate multilevel lumbar spine DDD, worse at L5-L6 with disc space height loss, endplate irregularity and sclerosis Redemonstrated marked gaseous distention of multiple loops of small bowel. Small amount of pneumoperitoneum is seen within the upper abdomen. Regional soft tissues appear normal. IMPRESSION: 1. Transitional anatomy with spinal labeling  as above. 2. Mild to moderate multilevel lumbar spine DDD, worse at L5-L6. Electronically Signed   By: Sandi Mariscal M.D.   On: 07/06/2019 08:01   DG Abd 1 View  Addendum Date: 07/07/2019   ADDENDUM REPORT: 07/07/2019 12:54 ADDENDUM: Critical Value/emergent results were called by telephone at the time of interpretation on 07/07/2019 at 12:54 pm to provider [facility] PATEL , who verbally acknowledged these results. Electronically Signed   By: Lowella Grip III M.D.   On: 07/07/2019 12:54   Result Date: 07/07/2019 CLINICAL DATA:  Nasogastric tube placement EXAM: ABDOMEN - 1 VIEW COMPARISON:  Jul 07, 2019 study obtained earlier in the day FINDINGS: Nasogastric tube tip and side port are in the stomach. There are multiple loops of dilated bowel. There is pneumoperitoneum. Lung bases are clear. There is postoperative change in the thoracic spine region. IMPRESSION: Nasogastric tube tip and side port in stomach. Persistent bowel dilatation. Increase in pneumoperitoneum compared to earlier in the day, a finding concerning for viscus perforation. Lung bases clear. These results will be called to the ordering clinician or representative by the Radiologist Assistant, and communication documented in the PACS or [employer]. Electronically Signed: By: Lowella Grip III M.D. On: 07/07/2019 12:51   DG Abd 1 View  Result Date: 07/06/2019 CLINICAL DATA:  Check gastric catheter placement EXAM:  ABDOMEN - 1 VIEW COMPARISON:  Film from earlier in the same day. FINDINGS: Persistent small bowel obstructive change is noted stable from the prior examination. Gastric catheter has been advanced with the tip in the stomach although the proximal side port lies in the distal esophagus and should be advanced several cm. Increase in the degree of free air is noted beneath the diaphragms. IMPRESSION: Gastric catheter with the tip in the stomach. This should be advanced several cm further into the stomach. Persistent small bowel obstructive change. Increasing pneumoperitoneum. These results will be called to the ordering clinician or representative by the Radiologist Assistant, and communication documented in the PACS or [employer]. Electronically Signed   By: Inez Catalina M.D.   On: 07/06/2019 13:26   CT HEAD WO CONTRAST  Result Date: 07/06/2019 CLINICAL DATA:  [place], altered level of consciousness EXAM: CT HEAD WITHOUT CONTRAST TECHNIQUE: Contiguous axial images were obtained from the base of the skull through the vertex without intravenous contrast. COMPARISON:  None. FINDINGS: Brain: No acute infarct or hemorrhage. Lateral ventricles and midline structures are unremarkable. No acute extra-axial fluid collections. No mass effect. Vascular: No hyperdense vessel or unexpected calcification. Skull: Normal. Negative for fracture or focal lesion. Sinuses/Orbits: No acute finding. Other: None. IMPRESSION: 1. No acute intracranial process. Electronically Signed   By: Randa Ngo M.D.   On: 07/06/2019 20:56   CT ABDOMEN PELVIS W CONTRAST  Result Date: 07/05/2019 CLINICAL DATA:  History of ovarian cancer, free air in the abdomen on prior radiographs EXAM: CT ABDOMEN AND PELVIS WITH CONTRAST TECHNIQUE: Multidetector CT imaging of the abdomen and pelvis was performed using the standard protocol following bolus administration of intravenous contrast. CONTRAST:  37m OMNIPAQUE IOHEXOL 300 MG/ML  SOLN COMPARISON:   Same-day abdominal radiographs, CT abdomen pelvis, 06/01/2019 FINDINGS: Lower chest: No acute abnormality. Hepatobiliary: No solid liver abnormality is seen. No gallstones, gallbladder wall thickening, or biliary dilatation. Pancreas: Unremarkable. No pancreatic ductal dilatation or surrounding inflammatory changes. Spleen: Normal in size without significant abnormality. Adrenals/Urinary Tract: Adrenal glands are unremarkable. Kidneys are normal, without renal calculi, solid lesion, or hydronephrosis. Bladder is unremarkable. Stomach/Bowel: Stomach is  within normal limits. The small bowel is diffusely fluid-filled, somewhat thickened and hyperenhancing although not overtly distended. The colon is generally decompressed with scattered gas present to the rectum. Sigmoid diverticulosis. Vascular/Lymphatic: Aortic atherosclerosis. No enlarged abdominal or pelvic lymph nodes. Reproductive: Status post hysterectomy and oophorectomy. Other: No abdominal wall hernia or abnormality. Anasarca. Small volume pneumoperitoneum. Small volume ascites throughout the abdomen and pelvis. There are minimal areas of peritoneal thickening and nodularity, example in the right paracolic gutter, consistent with peritoneal metastatic disease. Musculoskeletal: No acute or significant osseous findings. IMPRESSION: 1. Small volume pneumoperitoneum, in keeping with findings of prior radiographs. 2. The small bowel is diffusely fluid-filled, somewhat thickened and hyperenhancing, although not overtly distended. There is no pneumatosis, portal venous gas, or other specific findings to localize source of pneumoperitoneum. 3. 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 4.  Diverticulosis without evidence of acute diverticulitis. 5.  Aortic Atherosclerosis (ICD10-I70.0). Findings discussed by telephone with Dr. Dahlia Byes at the time of  interpretation. Electronically Signed   By: Eddie Candle M.D.   On: 07/05/2019 17:32   US RENAL  Result Date: 07/05/2019 CLINICAL DATA:  Acute renal injury, history of ovarian carcinoma EXAM: RENAL / URINARY TRACT ULTRASOUND COMPLETE COMPARISON:  None. FINDINGS: Right Kidney: Renal measurements: 9.9 x 3.8 x 5.2 cm. = volume: 104 mL. Mild increased echogenicity is noted. No mass lesion or hydronephrosis is seen. Left Kidney: Renal measurements: 9.6 x 4.9 x 5.1 cm. = volume: 128 mL. Increased echogenicity is noted. No mass lesion or hydronephrosis is noted. Bladder: Partially decompressed Other: None. IMPRESSION: Mild increased echogenicity consistent with medical renal disease. No obstructive changes are noted. Electronically Signed   By: Inez Catalina M.D.   On: 07/05/2019 13:11   US Abdomen Limited  Result Date: 07/05/2019 CLINICAL DATA:  Abdominal distension, possible ascites EXAM: LIMITED ABDOMEN ULTRASOUND FOR ASCITES TECHNIQUE: Limited ultrasound survey for ascites was performed in all four abdominal quadrants. COMPARISON:  None. FINDINGS: Minimal ascites is noted. Previously seen plain film showed significant small bowel dilatation. IMPRESSION: Minimal ascites. This is not the etiology of the patient's abdominal distension. CT of the abdomen and pelvis is again recommended for further evaluation. Electronically Signed   By: Inez Catalina M.D.   On: 07/05/2019 13:12   US Paracentesis  Result Date: 06/30/2019 INDICATION: Ovarian cancer with ascites. Request for diagnostic and therapeutic paracentesis. EXAM: ULTRASOUND GUIDED PARACENTESIS MEDICATIONS: 1% lidocaine 10 mL COMPLICATIONS: None immediate. PROCEDURE: Informed written consent was obtained from the patient after a discussion of the risks, benefits and alternatives to treatment. A timeout was performed prior to the initiation of the procedure. Initial ultrasound scanning demonstrates a moderate amount of ascites within the right lower abdominal  quadrant. The right lower abdomen was prepped and draped in the usual sterile fashion. 1% lidocaine was used for local anesthesia. Following this, a 6 Fr Safe-T-Centesis catheter was introduced. An ultrasound image was saved for documentation purposes. The paracentesis was performed. The catheter was removed and a dressing was applied. The patient tolerated the procedure well without immediate post procedural complication. FINDINGS: A total of approximately 1 L of clear yellow fluid was removed. Samples were sent to the laboratory as requested by the clinical team. IMPRESSION: Successful ultrasound-guided paracentesis yielding 1 liter of peritoneal fluid. Read by: Gareth Eagle, PA-C Electronically Signed   By: Marcello Moores  Register   On: 06/30/2019 11:21   DG ABD ACUTE 2+V  W 1V CHEST  Result Date: 07/06/2019 CLINICAL DATA:  Small-bowel obstruction and pneumoperitoneum. EXAM: DG ABDOMEN ACUTE W/ 1V CHEST COMPARISON:  07/05/2019; CT abdomen and pelvis-07/05/2019 FINDINGS: Redemonstrated marked gaseous distention of multiple loops of small bowel with index loop of small bowel within the right mid hemiabdomen measuring 7.6 cm in diameter. This finding is again associated with a conspicuous paucity of distal colonic gas. Small amount of pneumoperitoneum is seen within the upper abdomen, likely reduced in volume. No pneumatosis or portal venous gas Limited visualization of lower thorax demonstrates sequela of previous paraspinal fusion and corpectomy, incompletely evaluated. Degenerative change the lower lumbar spine is suspected though incompletely evaluated. IMPRESSION: 1. Similar findings worrisome for high-grade small bowel obstruction. 2. Small amount of pneumoperitoneum, likely reduced in volume compared to the 07/05/2019 examination. Electronically Signed   By: Sandi Mariscal M.D.   On: 07/06/2019 07:58   DG Abd Portable 1V  Result Date: 07/07/2019 CLINICAL DATA:  Small bowel obstruction.  Pneumoperitoneum EXAM:  PORTABLE ABDOMEN - 1 VIEW COMPARISON:  CT from 2 days ago FINDINGS: Continued small bowel obstruction with diffusely gas dilated bowel and thickened appearance of folds. Small volume pneumoperitoneum is likely seen below the right liver. The patient is supine, limiting direct comparison with yesterday. IMPRESSION: 1. Ongoing small bowel obstruction. 2. Supine positioning limits comparison with yesterday. Electronically Signed   By: Monte Fantasia M.D.   On: 07/07/2019 08:47   DG Abd Portable 1V  Result Date: 07/05/2019 CLINICAL DATA:  Abdominal distension EXAM: PORTABLE ABDOMEN - 1 VIEW COMPARISON:  06/01/2019 FINDINGS: Scattered large and small bowel gas is noted. Significant small bowel dilatation is noted to 7 cm. There are changes suspicious for free intraperitoneal air with regular sign and right upper quadrant air identified. CT of the abdomen and pelvis is recommended for further evaluation. No bony abnormality is seen. IMPRESSION: Significant dilatation of the small bowel with evidence of free air within the abdomen. CT of the abdomen and pelvis is recommended for further evaluation. Electronically Signed   By: Inez Catalina M.D.   On: 07/05/2019 09:16    PERFORMANCE STATUS (ECOG) : 3 - Symptomatic, >50% confined to bed  Review of Systems Unless otherwise noted, a complete review of systems is negative.  Physical Exam General: NAD, thin, frail-appearing Pulmonary: Unlabored Abdomen: Distended, tympanic Skin: no rashes Neurological: Weakness but otherwise nonfocal  IMPRESSION: No significant changes overnight.  Abdomen remains distended and tympanic.  NGT in place but no appreciable output overnight.  No BM overnight.  Patient has occasional abdominal pain but denies nausea or vomiting.  Dr. Janese Banks spoke with patient yesterday about the option of comfort care and hospice.  Patient tells me today that her primary goal is to eat and drink and that she believes that she is nearing end-of-life  and just wants to focus on being comfortable.  Verbalized a clear understanding that eating and drinking in the setting of perforation/SBO could lead to distressing symptoms, decline, or death.  She would like to proceed with comfort feedings.  She says that she is not interested in further work-up or aggressive tests or procedures.  She is in agreement with transfer to residential hospice when a bed is available.  I called and spoke with patient's significant other, Johnny.  Updated him on my conversation with patient this morning and he was also verbalized agreement with comfort care/hospice.  He plans to come to the hospital today and would like to meet with the chaplain to  complete ACP and other end-of-life documents.  PLAN: -Comfort care -Stop noncomfort related medications/labs -Comfort feeding -Would recommend leaving NGT in place as patient starts to eat so that it can be used in the event that she becomes symptomatic. Otherwise, if she pulls it or asks for it to be discontinued, I would not replace.  -TOC for help with discharge [place] when a bed is available.   Case and plan discussed with Drs. Dewaine Conger, and Sherral Hammers   Time Total: 20 minutes  Visit consisted of counseling and education dealing with the complex and emotionally intense issues of symptom management and palliative care in the setting of serious and potentially life-threatening illness.Greater than 50%  of this time was spent counseling and coordinating care related to the above assessment and plan.  Signed by: Altha Harm, PhD, NP-C

## 2019-07-08 NOTE — Progress Notes (Addendum)
Received order to discontinue every 4 hour CBGs per Dr Sidney Ace

## 2019-07-08 NOTE — Progress Notes (Signed)
[place] [facility]) Hospital Liaison RN note:  Received request from Altha Harm, NP for patient interest in going to the [facility]. Chart reviewed. Has been approved by Market researcher.  Spoke with patient and significant other, Johnny in room to acknowledge referral and explain services.   Unfortunately[facility] not able to offer a room today. Patient aware and  TOC Becky Dupree notified. [facility] Liaison will follow up tomorrow or sooner if room becomes available.  Please call with any hospice related questions or concerns.  Thank you for the opportunity to participate in this patients care.  Zandra Abts, RN [facility] Liaison [phone]

## 2019-07-08 NOTE — Progress Notes (Signed)
PROGRESS NOTE    Meredith Jones  [id number] DOB: 1955-12-20 DOA: 07/04/2019 PCP: Mar Daring, PA-C     Brief Narrative:  W68 year old WF PMHx high-grade serous adenocarcinoma stage IV.  SP chemotherapy.  Recent reoccurrence with ascites.  Hypothyroidism.  Type II IDDM.  Anemia chronic kidney disease.  Now presents with complaints of fatigue and tiredness and found to have pneumoperitoneum likely from bowel rupture. General surgery, medical oncology and palliative care consulted. Currently further plan is continue current conservative measures   Subjective: A/O x4, negative CP, negative abdominal pain,   Assessment & Plan:   Active Problems:   Adult hypothyroidism   Type 2 diabetes mellitus without complication ([place])   Anemia of chronic disease   Bilateral primary ovarian cancer (HCC)   Weakness   AKI (acute kidney injury) ([place])   Abdominal pain   Palliative care encounter    Pneumoperitoneum without any obvious cause. No evidence of ischemia or bowel pathology. Small bowel obstruction Presents with generalized weakness. X-ray abdomen shows evidence of pneumoperitoneum. CT abdomen with contrast was performed which confirmed the diagnosis although does not identify the location. Patient does not have any evidence of peritonitis or abdominal pain. Patient is very cachectic and debilitated. Per [facility] Safe risk assessment her risk for significant cardiovascular outcome is 2.9%. Current recommendation is to continue conservative measures with n.p.o., IV antibiotics and serial abdominal exam and if the patient's condition deteriorates offer surgery.  Appreciate medical oncology's assistance in this condition. Medical oncology recommended to initiate the patient on Decadron and currently holding after 1 dose. They also recommended to start the patient on octreotide. Patient was also recommended to be on Reglan which I have discontinued given her bowel perforation. NG  tube was inserted but patient pulled out it twice on 07/06/2019. NG tube was reinserted on 07/06/2019.  Following x-ray was actually showing worsening pneumoperitoneum. Discussed with medical oncology as well as general surgery and recommend continue conservative measures. No change in patient's condition patient does not have any symptoms right now.  2.  Goals of care discussion Had multiple goals of care discussion with this patient Palliative care following with the patient as well.  Son currently DNR/DNI with a goal to continue to treat what is treatable. Ideally patient will benefit from transitioning to comfort care approach only.  3.  Type 2 insulin-dependent diabetes mellitus uncontrolled with hyperglycemia Currently n.p.o. Every 4 hours CBG. Monitor.  4.  Hypothyroidism Change oral Synthroid to IV Synthroid.  5.  Acute kidney injury In the setting of bowel perforation. Improving. Transition to LR  Monitor renal function avoid nephrotoxic medications.  6.  UTI. Concern for intra-abdominal infection given her pneumoperitoneum Initially on IV ceftriaxone. Changed to IV meropenem on 07/05/2019.  Goals of care; 5/26 COMFORT CARE   DVT prophylaxis:  Code Status: Comfort care Family Communication:  Disposition Plan:  Status is: Inpatient    Dispo: The patient is from: Home              Anticipated d/c is to: Residential hospice              Anticipated d/c date is: Unknown              Patient currently unstable      Consultants:    Procedures/Significant Events:    I have personally reviewed and interpreted all radiology studies and my findings are as above.  VENTILATOR SETTINGS:    Cultures   Antimicrobials:    Devices  LINES / TUBES:      Continuous Infusions: . lactated ringers 75 mL/hr at 07/08/19 0820  . meropenem (MERREM) IV 1 g (07/08/19 [id number])     Objective: Vitals:   07/06/19 2321 07/07/19 0722 07/07/19 2305 07/08/19  0730  BP: 102/66 119/67 110/85 112/76  Pulse: 70 71 78 82  Resp:  16 16 20   Temp: (!) 97.5 F (36.4 C) 97.7 F (36.5 C) 97.9 F (36.6 C) 97.6 F (36.4 C)  TempSrc: Oral   Oral  SpO2: 95% 98% 98% 100%  Weight:      Height:        Intake/Output Summary (Last 24 hours) at 07/08/2019 0858 Last data filed at 07/08/2019 0300 Gross per 24 hour  Intake 1900 ml  Output 0 ml  Net 1900 ml   Filed Weights   07/04/19 1628 07/05/19 1057  Weight: 54.2 kg 53.2 kg    Examination:  General: No acute respiratory distress Eyes: negative scleral hemorrhage, negative anisocoria, negative icterus ENT: Negative Runny nose, negative gingival bleeding,, NG tube right nare draining minimal fluid Neck:  Negative scars, masses, torticollis, lymphadenopathy, JVD Lungs: Clear to auscultation bilaterally without wheezes or crackles Cardiovascular: Regular rate and rhythm without murmur gallop or rub normal S1 and S2 Abdomen: negative abdominal pain, positive distention, positive soft, bowel sounds, no rebound, no ascites, no appreciable mass Extremities: No significant cyanosis, clubbing, or edema bilateral lower extremities Skin: Negative rashes, lesions, ulcers Psychiatric:  Negative depression, negative anxiety, negative fatigue, negative mania  Central nervous system:  Cranial nerves II through XII intact, tongue/uvula midline, all extremities muscle strength 5/5, sensation intact throughout, negative dysarthria, negative expressive aphasia, negative receptive aphasia.  .     Data Reviewed: Care during the described time interval was provided by me .  I have reviewed this patient's available data, including medical history, events of note, physical examination, and all test results as part of my evaluation.  CBC: Recent Labs  Lab 07/04/19 1631 07/05/19 0717 07/06/19 0623 07/07/19 0748 07/08/19 0505  WBC 10.8* 9.7 6.5 6.8 5.3  NEUTROABS 9.8*  --   --  5.6 4.1  HGB 9.8* 9.4* 10.0* 9.3* 9.7*    HCT 28.1* 27.4* 30.4* 27.7* 29.5*  MCV 96.9 100.0 103.4* 101.5* 103.1*  PLT 169 165 152 137* 0000000*   Basic Metabolic Panel: Recent Labs  Lab 07/04/19 1631 07/05/19 0717 07/06/19 0754 07/07/19 0748 07/07/19 0752 07/08/19 0505  NA 133* 134* 135  --  137 139  K 3.3* 3.0* 2.7*  --  4.5 3.9  CL 97* 101 102  --  106 107  CO2 20* 17* 23  --  22 23  GLUCOSE 98 127* 113*  --  141* 129*  BUN 75* 78* 49*  --  36* 27*  CREATININE 1.67* 1.54* 0.96  --  0.81 0.67  CALCIUM 8.0* 7.8* 7.8*  --  7.7* 7.8*  MG  --   --   --  2.0  --  1.9  PHOS  --   --  2.5  --   --   --    GFR: Estimated Creatinine Clearance: 59.7 mL/min (by C-G formula based on SCr of 0.67 mg/dL). Liver Function Tests: Recent Labs  Lab 07/04/19 1631 07/06/19 0754 07/07/19 0752 07/08/19 0505  AST 25  --  16 17  ALT 19  --  12 13  ALKPHOS 88  --  85 75  BILITOT 1.0  --  0.9 1.2  PROT 5.5*  --  5.4* 5.4*  ALBUMIN 2.0* 1.9* 1.7* 1.8*   No results for input(s): LIPASE, AMYLASE in the last 168 hours. No results for input(s): AMMONIA in the last 168 hours. Coagulation Profile: No results for input(s): INR, PROTIME in the last 168 hours. Cardiac Enzymes: No results for input(s): CKTOTAL, CKMB, CKMBINDEX, TROPONINI in the last 168 hours. BNP (last 3 results) No results for input(s): PROBNP in the last 8760 hours. HbA1C: No results for input(s): HGBA1C in the last 72 hours. CBG: Recent Labs  Lab 07/07/19 1546 07/07/19 2044 07/07/19 2304 07/08/19 0430 07/08/19 0801  GLUCAP 117* 119* 112* 114* 105*   Lipid Profile: No results for input(s): CHOL, HDL, LDLCALC, TRIG, CHOLHDL, LDLDIRECT in the last 72 hours. Thyroid Function Tests: No results for input(s): TSH, T4TOTAL, FREET4, T3FREE, THYROIDAB in the last 72 hours. Anemia Panel: No results for input(s): VITAMINB12, FOLATE, FERRITIN, TIBC, IRON, RETICCTPCT in the last 72 hours. Sepsis Labs: No results for input(s): PROCALCITON, LATICACIDVEN in the last 168  hours.  Recent Results (from the past 240 hour(s))  SARS Coronavirus 2 by RT PCR (hospital order, performed in [facility] hospital lab) Nasopharyngeal Nasopharyngeal Swab     Status: None   Collection Time: 07/04/19  6:12 PM   Specimen: Nasopharyngeal Swab  Result Value Ref Range Status   SARS Coronavirus 2 NEGATIVE NEGATIVE Final    Comment: (NOTE) SARS-CoV-2 target nucleic acids are NOT DETECTED. The SARS-CoV-2 RNA is generally detectable in upper and lower respiratory specimens during the acute phase of infection. The lowest concentration of SARS-CoV-2 viral copies this assay can detect is 250 copies / mL. A negative result does not preclude SARS-CoV-2 infection and should not be used as the sole basis for treatment or other patient management decisions.  A negative result may occur with improper specimen collection / handling, submission of specimen other than nasopharyngeal swab, presence of viral mutation(s) within the areas targeted by this assay, and inadequate number of viral copies (<250 copies / mL). A negative result must be combined with clinical observations, patient history, and epidemiological information. Fact Sheet for Patients:   StrictlyIdeas.no Fact Sheet for Healthcare Providers: BankingDealers.co.za This test is not yet approved or cleared  by the [place] FDA and has been authorized for detection and/or diagnosis of SARS-CoV-2 by FDA under an Emergency Use Authorization (EUA).  This EUA will remain in effect (meaning this test can be used) for the duration of the COVID-19 declaration under Section 564(b)(1) of the Act, 21 U.S.C. section 360bbb-3(b)(1), unless the authorization is terminated or revoked sooner. Performed at [facility], [address] [place], [place] [address]          Radiology Studies: DG Abd 1 View  Addendum Date: 07/07/2019   ADDENDUM REPORT: 07/07/2019 12:54  ADDENDUM: Critical Value/emergent results were called by telephone at the time of interpretation on 07/07/2019 at 12:54 pm to provider [facility] PATEL , who verbally acknowledged these results. Electronically Signed   By: Lowella Grip III M.D.   On: 07/07/2019 12:54   Result Date: 07/07/2019 CLINICAL DATA:  Nasogastric tube placement EXAM: ABDOMEN - 1 VIEW COMPARISON:  Jul 07, 2019 study obtained earlier in the day FINDINGS: Nasogastric tube tip and side port are in the stomach. There are multiple loops of dilated bowel. There is pneumoperitoneum. Lung bases are clear. There is postoperative change in the thoracic spine region. IMPRESSION: Nasogastric tube tip and side port in stomach. Persistent bowel dilatation. Increase in pneumoperitoneum compared to earlier in  the day, a finding concerning for viscus perforation. Lung bases clear. These results will be called to the ordering clinician or representative by the Radiologist Assistant, and communication documented in the PACS or [employer]. Electronically Signed: By: Lowella Grip III M.D. On: 07/07/2019 12:51   DG Abd 1 View  Result Date: 07/06/2019 CLINICAL DATA:  Check gastric catheter placement EXAM: ABDOMEN - 1 VIEW COMPARISON:  Film from earlier in the same day. FINDINGS: Persistent small bowel obstructive change is noted stable from the prior examination. Gastric catheter has been advanced with the tip in the stomach although the proximal side port lies in the distal esophagus and should be advanced several cm. Increase in the degree of free air is noted beneath the diaphragms. IMPRESSION: Gastric catheter with the tip in the stomach. This should be advanced several cm further into the stomach. Persistent small bowel obstructive change. Increasing pneumoperitoneum. These results will be called to the ordering clinician or representative by the Radiologist Assistant, and communication documented in the PACS or [employer]. Electronically  Signed   By: Inez Catalina M.D.   On: 07/06/2019 13:26   CT HEAD WO CONTRAST  Result Date: 07/06/2019 CLINICAL DATA:  [place], altered level of consciousness EXAM: CT HEAD WITHOUT CONTRAST TECHNIQUE: Contiguous axial images were obtained from the base of the skull through the vertex without intravenous contrast. COMPARISON:  None. FINDINGS: Brain: No acute infarct or hemorrhage. Lateral ventricles and midline structures are unremarkable. No acute extra-axial fluid collections. No mass effect. Vascular: No hyperdense vessel or unexpected calcification. Skull: Normal. Negative for fracture or focal lesion. Sinuses/Orbits: No acute finding. Other: None. IMPRESSION: 1. No acute intracranial process. Electronically Signed   By: Randa Ngo M.D.   On: 07/06/2019 20:56   DG Abd Portable 1V  Result Date: 07/07/2019 CLINICAL DATA:  Small bowel obstruction.  Pneumoperitoneum EXAM: PORTABLE ABDOMEN - 1 VIEW COMPARISON:  CT from 2 days ago FINDINGS: Continued small bowel obstruction with diffusely gas dilated bowel and thickened appearance of folds. Small volume pneumoperitoneum is likely seen below the right liver. The patient is supine, limiting direct comparison with yesterday. IMPRESSION: 1. Ongoing small bowel obstruction. 2. Supine positioning limits comparison with yesterday. Electronically Signed   By: Monte Fantasia M.D.   On: 07/07/2019 08:47        Scheduled Meds: . insulin aspart  0-9 Units Subcutaneous Q4H  . levothyroxine  50 mcg Intravenous Daily  . octreotide  100 mcg Subcutaneous Q8H  . pantoprazole (PROTONIX) IV  40 mg Intravenous Q24H   Continuous Infusions: . lactated ringers 75 mL/hr at 07/08/19 0820  . meropenem (MERREM) IV 1 g (07/08/19 0626)     LOS: 4 days    Time spent:40 min    Lissa Rowles, Geraldo Docker, MD [facility] Pager [phone]  If 7PM-7AM, please contact night-coverage [email or web address] Password [facility] 07/08/2019, 8:58 AM

## 2019-07-08 NOTE — Progress Notes (Addendum)
OT Cancellation Note  Patient Details Name: Meredith Jones MRN: [id number] DOB: 03/24/1955   Cancelled Treatment:    Reason Eval/Treat Not Completed: Other (comment). Chart reviewed. Pt noted to have elected for comfort care with plans for DC to hospice home. Will complete OT orders at this time. Please re-consult if goals of care change. It was a pleasure to be involved in this patient's care.   Shara Blazing, M.S., OTR/L Ascom: [phone] 07/08/19, 2:06 PM

## 2019-07-09 DIAGNOSIS — Z515 Encounter for palliative care: Secondary | ICD-10-CM | POA: Diagnosis not present

## 2019-07-09 DIAGNOSIS — E039 Hypothyroidism, unspecified: Secondary | ICD-10-CM | POA: Diagnosis not present

## 2019-07-09 DIAGNOSIS — Z4659 Encounter for fitting and adjustment of other gastrointestinal appliance and device: Secondary | ICD-10-CM

## 2019-07-09 DIAGNOSIS — D638 Anemia in other chronic diseases classified elsewhere: Secondary | ICD-10-CM | POA: Diagnosis not present

## 2019-07-09 DIAGNOSIS — R188 Other ascites: Secondary | ICD-10-CM | POA: Diagnosis not present

## 2019-07-09 DIAGNOSIS — R1013 Epigastric pain: Secondary | ICD-10-CM | POA: Diagnosis not present

## 2019-07-09 DIAGNOSIS — R1030 Lower abdominal pain, unspecified: Secondary | ICD-10-CM | POA: Diagnosis not present

## 2019-07-09 DIAGNOSIS — Z0189 Encounter for other specified special examinations: Secondary | ICD-10-CM

## 2019-07-09 DIAGNOSIS — R140 Abdominal distension (gaseous): Secondary | ICD-10-CM

## 2019-07-09 DIAGNOSIS — C562 Malignant neoplasm of left ovary: Secondary | ICD-10-CM

## 2019-07-09 DIAGNOSIS — C561 Malignant neoplasm of right ovary: Secondary | ICD-10-CM

## 2019-07-09 LAB — GLUCOSE, CAPILLARY: Glucose-Capillary: 128 mg/dL — ABNORMAL HIGH (ref 70–99)

## 2019-07-09 NOTE — Progress Notes (Signed)
Ch visited with Pt and Pt's significant other to complete AD. Ch Waters had gathered the necessary people for AD completion. Ch was present while Pt, Pt's significant other Johnny, volunteer witnesses, and notary signed the AD. Ch made copies of AD to give to Pt, and added a copy to Pt's file as well.

## 2019-07-09 NOTE — Progress Notes (Signed)
Was called into room by sitter. Patient's NG tube accidentally came out while patient was trying to blow her nose. She asks that we do not re-insert it.

## 2019-07-09 NOTE — Progress Notes (Signed)
[facility]) Hospital Liaison RN note:  Unfortunately, Hospice Home does not have a bed to offer today. [place] Liaison will keep family and TOC updated daily.  Please call with any hospice related questions or concerns.  Zandra Abts, RN [facility] Liaison [phone]

## 2019-07-09 NOTE — Progress Notes (Signed)
Hematology/Oncology Consult note [facility]  [phone] [phone]  Patient Care Team: Rubye Beach as PCP - General (Family Medicine) Clent Jacks, RN as Oncology Nurse Navigator   Name of the [name]  [id number]  07/25/1955   Date of visit: 07/09/2019   Interval history- she is alert and awake. Denies any significant abdominal pain. Having her soup without any significant nausea/vomiting    Review of systems- Review of Systems  Constitutional: Positive for malaise/fatigue.  Gastrointestinal:       Abdominal distension       Allergies  Allergen Reactions  . Clarithromycin Nausea Only  . Doxycycline Nausea Only    dizziness  . Penicillins Hives    Did it involve swelling of the face/tongue/throat, SOB, or low BP? No Did it involve sudden or severe rash/hives, skin peeling, or any reaction on the inside of your mouth or nose? No Did you need to seek medical attention at a hospital or doctor's office? No When did it last happen?50+ years ago If all above answers are "NO", may proceed with cephalosporin use.      Past Medical History:  Diagnosis Date  . Allergy   . Anemia   . Complication of anesthesia   . Diabetes mellitus without complication ([place])   . Diverticulosis   . Family history of adverse reaction to anesthesia    mom-n/v  . Family history of lung cancer   . GERD (gastroesophageal reflux disease)    h/o  . Hyperlipidemia   . Hypothyroidism   . Ovarian cancer ([place]) 08/04/2018   Chemo tx's and Hysterectomy.  . Personal history of chemotherapy   . PONV (postoperative nausea and vomiting)    sick with tonsillectomy  . 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 Thyroid disease      Past Surgical History:  Procedure Laterality Date  . ABDOMINAL HYSTERECTOMY    . BACK SURGERY  2007   Lumbar Surgery  . BREAST CYST ASPIRATION Right    Around 10 years ago. Pt  thinks it was the right breast but not sure.  Marland Kitchen CATARACT EXTRACTION Left 2010   Right Eye 2011  . COLONOSCOPY WITH PROPOFOL N/A 09/02/2015   Procedure: COLONOSCOPY WITH PROPOFOL;  Surgeon: [name], MD;  Location: [place];  Service: Endoscopy;  Laterality: N/A;  Diabetic - oral meds  . ESOPHAGOGASTRODUODENOSCOPY (EGD) WITH PROPOFOL N/A 11/03/2015   Procedure: ESOPHAGOGASTRODUODENOSCOPY (EGD) WITH PROPOFOL;  Surgeon: [name], MD;  Location: [place];  Service: Endoscopy;  Laterality: N/A;  . EYE SURGERY Bilateral 06/2014    tear duck catherization ( Dr. Vallarie Mare)  . LAPAROSCOPIC APPENDECTOMY  11/26/2018   Procedure: APPENDECTOMY LAPAROSCOPIC;  Surgeon: Ward, Honor Loh, MD;  Location: [facility];  Service: Gynecology;;  . POLYPECTOMY  09/02/2015   Procedure: POLYPECTOMY;  Surgeon: [name], MD;  Location: [place];  Service: Endoscopy;;  . TONSILLECTOMY  1963    Social History   Socioeconomic History  . Marital status: Single    Spouse name: Not on file  . Number of children: Not on file  . Years of education: Not on file  . Highest education level: Not on file  Occupational History  . Not on file  Tobacco Use  . Smoking status: Never Smoker  . Smokeless tobacco: Never Used  Substance and Sexual Activity  . Alcohol use: No  . Drug use: No  . Sexual activity: Not  Currently  Other Topics Concern  . Not on file  Social History Narrative  . Not on file   Social Determinants of Health   Financial Resource Strain:   . Difficulty of Paying Living Expenses:   Food Insecurity:   . Worried About Charity fundraiser in the Last Year:   . Arboriculturist in the Last Year:   Transportation Needs:   . Film/video editor (Medical):   Marland Kitchen Lack of Transportation (Non-Medical):   Physical Activity:   . Days of Exercise per Week:   . Minutes of Exercise per Session:   Stress:   . Feeling of Stress :   Social Connections:   . Frequency of  Communication with Friends and Family:   . Frequency of Social Gatherings with Friends and Family:   . Attends Religious Services:   . Active Member of Clubs or Organizations:   . Attends Archivist Meetings:   Marland Kitchen Marital Status:   Intimate Partner Violence:   . Fear of Current or Ex-Partner:   . Emotionally Abused:   Marland Kitchen Physically Abused:   . Sexually Abused:     Family History  Problem Relation Age of Onset  . Congestive Heart Failure Mother   . Diabetes Mother   . Stroke Mother   . Hypothyroidism Father   . Cancer Father        lung cancer  . Diabetes Father   . Cancer Paternal Grandmother        stomach  . Breast cancer Neg Hx      Current Facility-Administered Medications:  .  haloperidol lactate (HALDOL) injection 1 mg, 1 mg, Intravenous, Q6H PRN, Borders, Kirt Boys, NP .  lactated ringers infusion, , Intravenous, Continuous, Lavina Hamman, MD, Last Rate: 75 mL/hr at 07/09/19 1157, New Bag at 07/09/19 1157 .  LORazepam (ATIVAN) injection 1-2 mg, 1-2 mg, Intravenous, Q4H PRN, Borders, Vonna Kotyk R, NP .  morphine 2 MG/ML injection 2-4 mg, 2-4 mg, Intravenous, Q2H PRN, Borders, Vonna Kotyk R, NP, 2 mg at 07/09/19 1514 .  octreotide (SANDOSTATIN) injection 100 mcg, 100 mcg, Subcutaneous, Q8H, Sindy Guadeloupe, MD, 100 mcg at 07/09/19 1512 .  ondansetron (ZOFRAN) injection 4 mg, 4 mg, Intravenous, Q6H, Borders, Joshua R, NP, 4 mg at 07/09/19 1756 .  pantoprazole (PROTONIX) injection 40 mg, 40 mg, Intravenous, Q24H, Lavina Hamman, MD, 40 mg at 07/09/19 1512  Facility-Administered Medications Ordered in Other Encounters:  .  sodium chloride flush (NS) 0.9 % injection 10 mL, 10 mL, Intravenous, Once, Sindy Guadeloupe, MD .  sodium chloride flush (NS) 0.9 % injection 10 mL, 10 mL, Intravenous, Once, Sindy Guadeloupe, MD .  sodium chloride flush (NS) 0.9 % injection 10 mL, 10 mL, Intravenous, Once, Sindy Guadeloupe, MD  Physical exam:  Vitals:   07/08/19 2304 07/09/19 0737 07/09/19  1134 07/09/19 1526  BP: 103/71 122/75 112/67 118/75  Pulse: 87 93 83 82  Resp: 16 18 18 18   Temp: 98.7 F (37.1 C) 97.9 F (36.6 C) (!) 97.4 F (36.3 C) 97.7 F (36.5 C)  TempSrc: Oral Oral Oral Oral  SpO2: 96% 98% 98% 95%  Weight:      Height:       Physical Exam Constitutional:      General: She is not in acute distress. Pulmonary:     Effort: Pulmonary effort is normal.  Abdominal:     Comments: Distended, tympanic  Skin:    General: Skin  is warm and dry.  Neurological:     Mental Status: She is alert and oriented to person, place, and time.      CMP Latest Ref Rng & Units 07/08/2019  Glucose 70 - 99 mg/dL 129(H)  BUN 8 - 23 mg/dL 27(H)  Creatinine 0.44 - 1.00 mg/dL 0.67  Sodium 135 - 145 mmol/L 139  Potassium 3.5 - 5.1 mmol/L 3.9  Chloride 98 - 111 mmol/L 107  CO2 22 - 32 mmol/L 23  Calcium 8.9 - 10.3 mg/dL 7.8(L)  Total Protein 6.5 - 8.1 g/dL 5.4(L)  Total Bilirubin 0.3 - 1.2 mg/dL 1.2  Alkaline Phos 38 - 126 U/L 75  AST 15 - 41 U/L 17  ALT 0 - 44 U/L 13   CBC Latest Ref Rng & Units 07/08/2019  WBC 4.0 - 10.5 K/uL 5.3  Hemoglobin 12.0 - 15.0 g/dL 9.7(L)  Hematocrit 36.0 - 46.0 % 29.5(L)  Platelets 150 - 400 K/uL 138(L)    @IMAGES @  DG Lumbar Spine 2-3 Views  Result Date: 07/06/2019 CLINICAL DATA:  Back pain EXAM: LUMBAR SPINE - 2-3 VIEW COMPARISON:  Abdominal radiographs-earlier same day; CT abdomen and pelvis-07/05/2019 FINDINGS: There are 6 non rib-bearing lumbar type vertebral bodies. For the purposes of this dictation, lumbar levels will be labeled L1 through L6. Grade 1 anterolisthesis of L4 upon L5 measuring approximately 3 mm. No definite pars defects Lumbar vertebral body heights appear preserved Mild to moderate multilevel lumbar spine DDD, worse at L5-L6 with disc space height loss, endplate irregularity and sclerosis Redemonstrated marked gaseous distention of multiple loops of small bowel. Small amount of pneumoperitoneum is seen within the upper  abdomen. Regional soft tissues appear normal. IMPRESSION: 1. Transitional anatomy with spinal labeling as above. 2. Mild to moderate multilevel lumbar spine DDD, worse at L5-L6. Electronically Signed   By: Sandi Mariscal M.D.   On: 07/06/2019 08:01   DG Abd 1 View  Addendum Date: 07/07/2019   ADDENDUM REPORT: 07/07/2019 12:54 ADDENDUM: Critical Value/emergent results were called by telephone at the time of interpretation on 07/07/2019 at 12:54 pm to provider [facility] PATEL , who verbally acknowledged these results. Electronically Signed   By: Lowella Grip III M.D.   On: 07/07/2019 12:54   Result Date: 07/07/2019 CLINICAL DATA:  Nasogastric tube placement EXAM: ABDOMEN - 1 VIEW COMPARISON:  Jul 07, 2019 study obtained earlier in the day FINDINGS: Nasogastric tube tip and side port are in the stomach. There are multiple loops of dilated bowel. There is pneumoperitoneum. Lung bases are clear. There is postoperative change in the thoracic spine region. IMPRESSION: Nasogastric tube tip and side port in stomach. Persistent bowel dilatation. Increase in pneumoperitoneum compared to earlier in the day, a finding concerning for viscus perforation. Lung bases clear. These results will be called to the ordering clinician or representative by the Radiologist Assistant, and communication documented in the PACS or [employer]. Electronically Signed: By: Lowella Grip III M.D. On: 07/07/2019 12:51   DG Abd 1 View  Result Date: 07/06/2019 CLINICAL DATA:  Check gastric catheter placement EXAM: ABDOMEN - 1 VIEW COMPARISON:  Film from earlier in the same day. FINDINGS: Persistent small bowel obstructive change is noted stable from the prior examination. Gastric catheter has been advanced with the tip in the stomach although the proximal side port lies in the distal esophagus and should be advanced several cm. Increase in the degree of free air is noted beneath the diaphragms. IMPRESSION: Gastric catheter with the tip  in  the stomach. This should be advanced several cm further into the stomach. Persistent small bowel obstructive change. Increasing pneumoperitoneum. These results will be called to the ordering clinician or representative by the Radiologist Assistant, and communication documented in the PACS or [employer]. Electronically Signed   By: Inez Catalina M.D.   On: 07/06/2019 13:26   CT HEAD WO CONTRAST  Result Date: 07/06/2019 CLINICAL DATA:  [place], altered level of consciousness EXAM: CT HEAD WITHOUT CONTRAST TECHNIQUE: Contiguous axial images were obtained from the base of the skull through the vertex without intravenous contrast. COMPARISON:  None. FINDINGS: Brain: No acute infarct or hemorrhage. Lateral ventricles and midline structures are unremarkable. No acute extra-axial fluid collections. No mass effect. Vascular: No hyperdense vessel or unexpected calcification. Skull: Normal. Negative for fracture or focal lesion. Sinuses/Orbits: No acute finding. Other: None. IMPRESSION: 1. No acute intracranial process. Electronically Signed   By: Randa Ngo M.D.   On: 07/06/2019 20:56   CT ABDOMEN PELVIS W CONTRAST  Result Date: 07/05/2019 CLINICAL DATA:  History of ovarian cancer, free air in the abdomen on prior radiographs EXAM: CT ABDOMEN AND PELVIS WITH CONTRAST TECHNIQUE: Multidetector CT imaging of the abdomen and pelvis was performed using the standard protocol following bolus administration of intravenous contrast. CONTRAST:  81mL OMNIPAQUE IOHEXOL 300 MG/ML  SOLN COMPARISON:  Same-day abdominal radiographs, CT abdomen pelvis, 06/01/2019 FINDINGS: Lower chest: No acute abnormality. Hepatobiliary: No solid liver abnormality is seen. No gallstones, gallbladder wall thickening, or biliary dilatation. Pancreas: Unremarkable. No pancreatic ductal dilatation or surrounding inflammatory changes. Spleen: Normal in size without significant abnormality. Adrenals/Urinary Tract: Adrenal glands are unremarkable.  Kidneys are normal, without renal calculi, solid lesion, or hydronephrosis. Bladder is unremarkable. Stomach/Bowel: Stomach is within normal limits. The small bowel is diffusely fluid-filled, somewhat thickened and hyperenhancing although not overtly distended. The colon is generally decompressed with scattered gas present to the rectum. Sigmoid diverticulosis. Vascular/Lymphatic: Aortic atherosclerosis. No enlarged abdominal or pelvic lymph nodes. Reproductive: Status post hysterectomy and oophorectomy. Other: No abdominal wall hernia or abnormality. Anasarca. Small volume pneumoperitoneum. Small volume ascites throughout the abdomen and pelvis. There are minimal areas of peritoneal thickening and nodularity, example in the right paracolic gutter, consistent with peritoneal metastatic disease. Musculoskeletal: No acute or significant osseous findings. IMPRESSION: 1. Small volume pneumoperitoneum, in keeping with findings of prior radiographs. 2. The small bowel is diffusely fluid-filled, somewhat thickened and hyperenhancing, although not overtly distended. There is no pneumatosis, portal venous gas, or other specific findings to localize source of pneumoperitoneum. 3. 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 4.  Diverticulosis without evidence of acute diverticulitis. 5.  Aortic Atherosclerosis (ICD10-I70.0). Findings discussed by telephone with Dr. Dahlia Byes at the time of interpretation. Electronically Signed   By: Eddie Candle M.D.   On: 07/05/2019 17:32   US RENAL  Result Date: 07/05/2019 CLINICAL DATA:  Acute renal injury, history of ovarian carcinoma EXAM: RENAL / URINARY TRACT ULTRASOUND COMPLETE COMPARISON:  None. FINDINGS: Right Kidney: Renal measurements: 9.9 x 3.8 x 5.2 cm. = volume: 104 mL. Mild increased echogenicity is noted. No mass lesion or hydronephrosis is seen. Left  Kidney: Renal measurements: 9.6 x 4.9 x 5.1 cm. = volume: 128 mL. Increased echogenicity is noted. No mass lesion or hydronephrosis is noted. Bladder: Partially decompressed Other: None. IMPRESSION: Mild increased echogenicity consistent with medical renal disease. No obstructive changes are noted. Electronically Signed   By: Elta Guadeloupe  Lukens M.D.   On: 07/05/2019 13:11   US Abdomen Limited  Result Date: 07/05/2019 CLINICAL DATA:  Abdominal distension, possible ascites EXAM: LIMITED ABDOMEN ULTRASOUND FOR ASCITES TECHNIQUE: Limited ultrasound survey for ascites was performed in all four abdominal quadrants. COMPARISON:  None. FINDINGS: Minimal ascites is noted. Previously seen plain film showed significant small bowel dilatation. IMPRESSION: Minimal ascites. This is not the etiology of the patient's abdominal distension. CT of the abdomen and pelvis is again recommended for further evaluation. Electronically Signed   By: Inez Catalina M.D.   On: 07/05/2019 13:12   US Paracentesis  Result Date: 06/30/2019 INDICATION: Ovarian cancer with ascites. Request for diagnostic and therapeutic paracentesis. EXAM: ULTRASOUND GUIDED PARACENTESIS MEDICATIONS: 1% lidocaine 10 mL COMPLICATIONS: None immediate. PROCEDURE: Informed written consent was obtained from the patient after a discussion of the risks, benefits and alternatives to treatment. A timeout was performed prior to the initiation of the procedure. Initial ultrasound scanning demonstrates a moderate amount of ascites within the right lower abdominal quadrant. The right lower abdomen was prepped and draped in the usual sterile fashion. 1% lidocaine was used for local anesthesia. Following this, a 6 Fr Safe-T-Centesis catheter was introduced. An ultrasound image was saved for documentation purposes. The paracentesis was performed. The catheter was removed and a dressing was applied. The patient tolerated the procedure well without immediate post procedural  complication. FINDINGS: A total of approximately 1 L of clear yellow fluid was removed. Samples were sent to the laboratory as requested by the clinical team. IMPRESSION: Successful ultrasound-guided paracentesis yielding 1 liter of peritoneal fluid. Read by: Gareth Eagle, PA-C Electronically Signed   By: Marcello Moores  Register   On: 06/30/2019 11:21   DG ABD ACUTE 2+V W 1V CHEST  Result Date: 07/06/2019 CLINICAL DATA:  Small-bowel obstruction and pneumoperitoneum. EXAM: DG ABDOMEN ACUTE W/ 1V CHEST COMPARISON:  07/05/2019; CT abdomen and pelvis-07/05/2019 FINDINGS: Redemonstrated marked gaseous distention of multiple loops of small bowel with index loop of small bowel within the right mid hemiabdomen measuring 7.6 cm in diameter. This finding is again associated with a conspicuous paucity of distal colonic gas. Small amount of pneumoperitoneum is seen within the upper abdomen, likely reduced in volume. No pneumatosis or portal venous gas Limited visualization of lower thorax demonstrates sequela of previous paraspinal fusion and corpectomy, incompletely evaluated. Degenerative change the lower lumbar spine is suspected though incompletely evaluated. IMPRESSION: 1. Similar findings worrisome for high-grade small bowel obstruction. 2. Small amount of pneumoperitoneum, likely reduced in volume compared to the 07/05/2019 examination. Electronically Signed   By: Sandi Mariscal M.D.   On: 07/06/2019 07:58   DG Abd Portable 1V  Result Date: 07/07/2019 CLINICAL DATA:  Small bowel obstruction.  Pneumoperitoneum EXAM: PORTABLE ABDOMEN - 1 VIEW COMPARISON:  CT from 2 days ago FINDINGS: Continued small bowel obstruction with diffusely gas dilated bowel and thickened appearance of folds. Small volume pneumoperitoneum is likely seen below the right liver. The patient is supine, limiting direct comparison with yesterday. IMPRESSION: 1. Ongoing small bowel obstruction. 2. Supine positioning limits comparison with yesterday.  Electronically Signed   By: Monte Fantasia M.D.   On: 07/07/2019 08:47   DG Abd Portable 1V  Result Date: 07/05/2019 CLINICAL DATA:  Abdominal distension EXAM: PORTABLE ABDOMEN - 1 VIEW COMPARISON:  06/01/2019 FINDINGS: Scattered large and small bowel gas is noted. Significant small bowel dilatation is noted to 7 cm. There are changes suspicious for free intraperitoneal air with regular sign and right upper quadrant air identified.  CT of the abdomen and pelvis is recommended for further evaluation. No bony abnormality is seen. IMPRESSION: Significant dilatation of the small bowel with evidence of free air within the abdomen. CT of the abdomen and pelvis is recommended for further evaluation. Electronically Signed   By: Inez Catalina M.D.   On: 07/05/2019 09:16     Assessment and plan- Patient is a 64 y.o. 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 She is being offered food for comfort. She is DNR/DNI awaiting bed at hospice home. Hemodynamically remains stable. No nausea/vomiting. Mentally alert and comfortable despite abdominal distension. NG tube is now out   Visit Diagnosis 1. Weakness   2. AKI (acute kidney injury) ([place])   3. Distended abdomen   4. Ascites   5. Pneumoperitoneum   6. Pain   7. Small bowel obstruction ([place])   8. Encounter for imaging study to confirm nasogastric (NG) tube placement   9. SBO (small bowel obstruction) ([place])   10. Encounter for nasogastric (NG) tube placement   11. Palliative care encounter      Dr. Randa Evens, MD, MPH [facility] [id number] 07/09/2019 9:31 PM

## 2019-07-09 NOTE — Progress Notes (Signed)
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 She is s/pTAH and BSO on 11/26/2018. She was receiving adjuvant systemic chemotherapy.Patient underwent recent paracentesis with cytology positive for malignancy.  Patient was admitted to the hospital on 07/02/2019 with progressive weakness, decreased oral intake, and worsening abdominal pain.  KUB revealed pneumoperitoneum, which was confirmed on abdominal CT.  Subsequent KUB was concerning for development of high-grade SBO.  Surgery has evaluated and does not feel the patient is a surgical candidate.  She is being managed conservatively.  Palliative care was consulted help address goals.   CODE STATUS: DNR  PAST MEDICAL HISTORY: Past Medical History:  Diagnosis Date  . Allergy   . Anemia   . Complication of anesthesia   . Diabetes mellitus without complication ([place])   . Diverticulosis   . Family history of adverse reaction to anesthesia    mom-n/v  . Family history of lung cancer   . GERD (gastroesophageal reflux disease)    h/o  . Hyperlipidemia   . Hypothyroidism   . Ovarian cancer ([place]) 08/04/2018   Chemo tx's and Hysterectomy.  . Personal history of chemotherapy   . PONV (postoperative nausea and vomiting)    sick with tonsillectomy  . 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 Thyroid disease     PAST SURGICAL HISTORY:  Past Surgical History:  Procedure Laterality Date  . ABDOMINAL HYSTERECTOMY    . BACK SURGERY  2007   Lumbar Surgery  . BREAST CYST ASPIRATION Right    Around 10 years ago. Pt thinks it  was the right breast but not sure.  Marland Kitchen CATARACT EXTRACTION Left 2010   Right Eye 2011  . COLONOSCOPY WITH PROPOFOL N/A 09/02/2015   Procedure: COLONOSCOPY WITH PROPOFOL;  Surgeon: [name], MD;  Location: [facility];  Service: Endoscopy;  Laterality: N/A;  Diabetic - oral meds  . ESOPHAGOGASTRODUODENOSCOPY (EGD) WITH PROPOFOL N/A 11/03/2015   Procedure: ESOPHAGOGASTRODUODENOSCOPY (EGD) WITH PROPOFOL;  Surgeon: [name], MD;  Location: [place];  Service: Endoscopy;  Laterality: N/A;  . EYE SURGERY Bilateral 06/2014    tear duck catherization ( Dr. Vallarie Mare)  . LAPAROSCOPIC APPENDECTOMY  11/26/2018   Procedure: APPENDECTOMY LAPAROSCOPIC;  Surgeon: Ward, Honor Loh, MD;  Location: [facility];  Service: Gynecology;;  . POLYPECTOMY  09/02/2015   Procedure: POLYPECTOMY;  Surgeon: [name], MD;  Location: [facility];  Service: Endoscopy;;  . TONSILLECTOMY  1963    HEMATOLOGY/ONCOLOGY HISTORY:  Oncology History  Serous adenocarcinoma ([place])  08/13/2018 Initial Diagnosis   Serous adenocarcinoma ([place])   08/14/2018 Cancer Staging   Staging form: Exocrine Pancreas, AJCC 8th Edition - Clinical stage from 08/14/2018: Stage III (cT3, cN2, cM0) - Signed by Sindy Guadeloupe, MD on 08/17/2018   08/21/2018 -  Chemotherapy   The patient had palonosetron (ALOXI) injection 0.25 mg, 0.25 mg, Intravenous,  Once, 6 of 6 cycles Administration: 0.25 mg (08/21/2018), 0.25 mg (09/11/2018), 0.25 mg (10/02/2018), 0.25 mg (10/23/2018), 0.25 mg (12/22/2018), 0.25 mg (01/12/2019) pegfilgrastim (NEULASTA ONPRO KIT) injection 6 mg, 6 mg, Subcutaneous, Once, 6 of 6 cycles Administration: 6 mg (08/21/2018), 6  mg (09/11/2018), 6 mg (10/02/2018), 6 mg (10/23/2018), 6 mg (12/22/2018), 6 mg (01/12/2019) bevacizumab (AVASTIN) 800 mg in sodium chloride 0.9 % 100 mL chemo infusion, 15 mg/kg = 800 mg (100 % of original dose 15 mg/kg), Intravenous,  Once, 1 of 1 cycle Dose modification: 15 mg/kg (original dose 15 mg/kg,  Cycle 1) Administration: 800 mg (08/21/2018) CARBOplatin (PARAPLATIN) 430 mg in sodium chloride 0.9 % 250 mL chemo infusion, 430 mg (100 % of original dose 429.5 mg), Intravenous,  Once, 6 of 6 cycles Dose modification:   (original dose 429.5 mg, Cycle 1) Administration: 430 mg (08/21/2018), 430 mg (09/11/2018), 430 mg (10/02/2018), 430 mg (10/23/2018), 390 mg (12/22/2018), 390 mg (01/12/2019) PACLitaxel (TAXOL) 270 mg in sodium chloride 0.9 % 250 mL chemo infusion (> '80mg'$ /m2), 175 mg/m2 = 270 mg, Intravenous,  Once, 6 of 6 cycles Dose modification: 160 mg/m2 (original dose 175 mg/m2, Cycle 5, Reason: Other (see comments), Comment: neuropathy) Administration: 270 mg (08/21/2018), 270 mg (09/11/2018), 270 mg (10/02/2018), 270 mg (10/23/2018), 246 mg (12/22/2018), 246 mg (01/12/2019) fosaprepitant (EMEND) 150 mg, dexamethasone (DECADRON) 12 mg in sodium chloride 0.9 % 145 mL IVPB, , Intravenous,  Once, 6 of 6 cycles Administration:  (08/21/2018),  (09/11/2018),  (10/02/2018),  (10/23/2018),  (12/22/2018),  (01/12/2019) bevacizumab-awwb (MVASI) 800 mg in sodium chloride 0.9 % 100 mL chemo infusion, 15 mg/kg = 800 mg (100 % of original dose 15 mg/kg), Intravenous,  Once, 10 of 11 cycles Dose modification: 15 mg/kg (original dose 15 mg/kg, Cycle 2) Administration: 800 mg (09/11/2018), 800 mg (10/02/2018), 800 mg (01/12/2019), 800 mg (02/02/2019), 800 mg (02/23/2019), 800 mg (03/16/2019), 800 mg (04/06/2019), 800 mg (04/27/2019), 800 mg (05/18/2019), 800 mg (06/08/2019)  for chemotherapy treatment.    11/07/2018 Genetic Testing   Negative germline + somatic genetic testing. No pathogenic variants identified on the [facility] panel and [facility] HRD. The [facility] gene panel offered by [employer] includes sequencing and deletion/duplication testing of the following 35 genes: APC, ATM, AXIN2, BARD1, BMPR1A, BRCA1, BRCA2, BRIP1, CHD1, CDK4, CDKN2A, CHEK2, EPCAM (large rearrangement only), HOXB13, GALNT12, MLH1,  MSH2, MSH3, MSH6, MUTYH, NBN, NTHL1, PALB2, PMS2, PTEN, RAD51C, RAD51D, RNF43, RPS20, SMAD4, STK11, and TP53. Sequencing was performed for select regions of POLE and POLD1, and large rearrangement analysis was performed for select regions of GREM1. The report date is 11/07/2018.    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  MEDICATIONS:  Current Facility-Administered Medications  Medication Dose Route Frequency Provider Last Rate Last Admin  . haloperidol lactate (HALDOL) injection 1 mg  1 mg Intravenous Q6H PRN Olanna Percifield, Kirt Boys, NP      . lactated ringers infusion   Intravenous Continuous Lavina Hamman, MD 75 mL/hr at 07/08/19 0820 New Bag at 07/08/19 0820  . LORazepam (ATIVAN) injection 1-2 mg  1-2 mg Intravenous Q4H PRN Divon Krabill, Vonna Kotyk R, NP      . morphine 2 MG/ML injection 2-4 mg  2-4 mg Intravenous Q2H PRN Blaiden Werth, Kirt Boys, NP   2 mg at 07/09/19 0015  . octreotide (SANDOSTATIN) injection 100 mcg  100 mcg Subcutaneous Q8H Sindy Guadeloupe, MD   100 mcg at 07/09/19 0602  . ondansetron (ZOFRAN) injection 4 mg  4 mg Intravenous Q6H Annlee Glandon, Kirt Boys, NP   4 mg at 07/09/19 0529  . pantoprazole (PROTONIX) injection 40 mg  40 mg Intravenous  Q24H Lavina Hamman, MD   40 mg at 07/08/19 1301   Facility-Administered Medications Ordered in Other Encounters  Medication Dose Route Frequency Provider Last Rate Last Admin  . sodium chloride flush (NS) 0.9 % injection 10 mL  10 mL Intravenous Once Sindy Guadeloupe, MD      . sodium chloride flush (NS) 0.9 % injection 10 mL  10 mL Intravenous Once Sindy Guadeloupe, MD      . sodium chloride flush (NS) 0.9 % injection 10 mL  10 mL Intravenous Once Sindy Guadeloupe, MD        VITAL SIGNS: BP 122/75 (BP Location: Right Arm)   Pulse 93   Temp 97.9 F (36.6 C) (Oral)   Resp 18   Ht '5\' 4"'$  (1.626 m)   Wt 117 lb  4.6 oz (53.2 kg)   SpO2 98%   BMI 20.13 kg/m  Filed Weights   07/04/19 1628 07/05/19 1057  Weight: 119 lb 8 oz (54.2 kg) 117 lb 4.6 oz (53.2 kg)    Estimated body mass index is 20.13 kg/m as calculated from the following:   Height as of this encounter: '5\' 4"'$  (1.626 m).   Weight as of this encounter: 117 lb 4.6 oz (53.2 kg).  LABS: CBC:    Component Value Date/Time   WBC 5.3 07/08/2019 0505   HGB 9.7 (L) 07/08/2019 0505   HGB 10.2 (L) 05/31/2017 0836   HCT 29.5 (L) 07/08/2019 0505   HCT 31.4 (L) 05/31/2017 0836   PLT 138 (L) 07/08/2019 0505   PLT 374 05/31/2017 0836   MCV 103.1 (H) 07/08/2019 0505   MCV 100 (H) 05/31/2017 0836   NEUTROABS 4.1 07/08/2019 0505   NEUTROABS 3.4 05/31/2017 0836   LYMPHSABS 0.8 07/08/2019 0505   LYMPHSABS 1.0 05/31/2017 0836   MONOABS 0.3 07/08/2019 0505   EOSABS 0.0 07/08/2019 0505   EOSABS 0.4 05/31/2017 0836   BASOSABS 0.0 07/08/2019 0505   BASOSABS 0.0 05/31/2017 0836   Comprehensive Metabolic Panel:    Component Value Date/Time   NA 139 07/08/2019 0505   NA 142 05/31/2017 0836   K 3.9 07/08/2019 0505   CL 107 07/08/2019 0505   CO2 23 07/08/2019 0505   BUN 27 (H) 07/08/2019 0505   BUN 9 05/31/2017 0836   CREATININE 0.67 07/08/2019 0505   CREATININE 0.57 11/13/2016 0933   GLUCOSE 129 (H) 07/08/2019 0505   CALCIUM 7.8 (L) 07/08/2019 0505   AST 17 07/08/2019 0505   ALT 13 07/08/2019 0505   ALKPHOS 75 07/08/2019 0505   BILITOT 1.2 07/08/2019 0505   BILITOT <0.2 05/31/2017 0836   PROT 5.4 (L) 07/08/2019 0505   PROT 6.1 05/31/2017 0836   ALBUMIN 1.8 (L) 07/08/2019 0505   ALBUMIN 3.4 (L) 05/31/2017 0836    RADIOGRAPHIC STUDIES: DG Lumbar Spine 2-3 Views  Result Date: 07/06/2019 CLINICAL DATA:  Back pain EXAM: LUMBAR SPINE - 2-3 VIEW COMPARISON:  Abdominal radiographs-earlier same day; CT abdomen and pelvis-07/05/2019 FINDINGS: There are 6 non rib-bearing lumbar type vertebral bodies. For the purposes of this dictation, lumbar  levels will be labeled L1 through L6. Grade 1 anterolisthesis of L4 upon L5 measuring approximately 3 mm. No definite pars defects Lumbar vertebral body heights appear preserved Mild to moderate multilevel lumbar spine DDD, worse at L5-L6 with disc space height loss, endplate irregularity and sclerosis Redemonstrated marked gaseous distention of multiple loops of small bowel. Small amount of pneumoperitoneum is seen within the upper abdomen. Regional soft  tissues appear normal. IMPRESSION: 1. Transitional anatomy with spinal labeling as above. 2. Mild to moderate multilevel lumbar spine DDD, worse at L5-L6. Electronically Signed   By: Sandi Mariscal M.D.   On: 07/06/2019 08:01   DG Abd 1 View  Addendum Date: 07/07/2019   ADDENDUM REPORT: 07/07/2019 12:54 ADDENDUM: Critical Value/emergent results were called by telephone at the time of interpretation on 07/07/2019 at 12:54 pm to provider [facility] PATEL , who verbally acknowledged these results. Electronically Signed   By: Lowella Grip III M.D.   On: 07/07/2019 12:54   Result Date: 07/07/2019 CLINICAL DATA:  Nasogastric tube placement EXAM: ABDOMEN - 1 VIEW COMPARISON:  Jul 07, 2019 study obtained earlier in the day FINDINGS: Nasogastric tube tip and side port are in the stomach. There are multiple loops of dilated bowel. There is pneumoperitoneum. Lung bases are clear. There is postoperative change in the thoracic spine region. IMPRESSION: Nasogastric tube tip and side port in stomach. Persistent bowel dilatation. Increase in pneumoperitoneum compared to earlier in the day, a finding concerning for viscus perforation. Lung bases clear. These results will be called to the ordering clinician or representative by the Radiologist Assistant, and communication documented in the PACS or [employer]. Electronically Signed: By: Lowella Grip III M.D. On: 07/07/2019 12:51   DG Abd 1 View  Result Date: 07/06/2019 CLINICAL DATA:  Check gastric catheter  placement EXAM: ABDOMEN - 1 VIEW COMPARISON:  Film from earlier in the same day. FINDINGS: Persistent small bowel obstructive change is noted stable from the prior examination. Gastric catheter has been advanced with the tip in the stomach although the proximal side port lies in the distal esophagus and should be advanced several cm. Increase in the degree of free air is noted beneath the diaphragms. IMPRESSION: Gastric catheter with the tip in the stomach. This should be advanced several cm further into the stomach. Persistent small bowel obstructive change. Increasing pneumoperitoneum. These results will be called to the ordering clinician or representative by the Radiologist Assistant, and communication documented in the PACS or [employer]. Electronically Signed   By: Inez Catalina M.D.   On: 07/06/2019 13:26   CT HEAD WO CONTRAST  Result Date: 07/06/2019 CLINICAL DATA:  [place], altered level of consciousness EXAM: CT HEAD WITHOUT CONTRAST TECHNIQUE: Contiguous axial images were obtained from the base of the skull through the vertex without intravenous contrast. COMPARISON:  None. FINDINGS: Brain: No acute infarct or hemorrhage. Lateral ventricles and midline structures are unremarkable. No acute extra-axial fluid collections. No mass effect. Vascular: No hyperdense vessel or unexpected calcification. Skull: Normal. Negative for fracture or focal lesion. Sinuses/Orbits: No acute finding. Other: None. IMPRESSION: 1. No acute intracranial process. Electronically Signed   By: Randa Ngo M.D.   On: 07/06/2019 20:56   CT ABDOMEN PELVIS W CONTRAST  Result Date: 07/05/2019 CLINICAL DATA:  History of ovarian cancer, free air in the abdomen on prior radiographs EXAM: CT ABDOMEN AND PELVIS WITH CONTRAST TECHNIQUE: Multidetector CT imaging of the abdomen and pelvis was performed using the standard protocol following bolus administration of intravenous contrast. CONTRAST:  57m OMNIPAQUE IOHEXOL 300 MG/ML   SOLN COMPARISON:  Same-day abdominal radiographs, CT abdomen pelvis, 06/01/2019 FINDINGS: Lower chest: No acute abnormality. Hepatobiliary: No solid liver abnormality is seen. No gallstones, gallbladder wall thickening, or biliary dilatation. Pancreas: Unremarkable. No pancreatic ductal dilatation or surrounding inflammatory changes. Spleen: Normal in size without significant abnormality. Adrenals/Urinary Tract: Adrenal glands are unremarkable. Kidneys are normal, without renal calculi,  solid lesion, or hydronephrosis. Bladder is unremarkable. Stomach/Bowel: Stomach is within normal limits. The small bowel is diffusely fluid-filled, somewhat thickened and hyperenhancing although not overtly distended. The colon is generally decompressed with scattered gas present to the rectum. Sigmoid diverticulosis. Vascular/Lymphatic: Aortic atherosclerosis. No enlarged abdominal or pelvic lymph nodes. Reproductive: Status post hysterectomy and oophorectomy. Other: No abdominal wall hernia or abnormality. Anasarca. Small volume pneumoperitoneum. Small volume ascites throughout the abdomen and pelvis. There are minimal areas of peritoneal thickening and nodularity, example in the right paracolic gutter, consistent with peritoneal metastatic disease. Musculoskeletal: No acute or significant osseous findings. IMPRESSION: 1. Small volume pneumoperitoneum, in keeping with findings of prior radiographs. 2. The small bowel is diffusely fluid-filled, somewhat thickened and hyperenhancing, although not overtly distended. There is no pneumatosis, portal venous gas, or other specific findings to localize source of pneumoperitoneum. 3. 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 4.  Diverticulosis without evidence of acute diverticulitis. 5.  Aortic Atherosclerosis (ICD10-I70.0). Findings discussed by telephone with Dr.  Dahlia Byes at the time of interpretation. Electronically Signed   By: Eddie Candle M.D.   On: 07/05/2019 17:32   US RENAL  Result Date: 07/05/2019 CLINICAL DATA:  Acute renal injury, history of ovarian carcinoma EXAM: RENAL / URINARY TRACT ULTRASOUND COMPLETE COMPARISON:  None. FINDINGS: Right Kidney: Renal measurements: 9.9 x 3.8 x 5.2 cm. = volume: 104 mL. Mild increased echogenicity is noted. No mass lesion or hydronephrosis is seen. Left Kidney: Renal measurements: 9.6 x 4.9 x 5.1 cm. = volume: 128 mL. Increased echogenicity is noted. No mass lesion or hydronephrosis is noted. Bladder: Partially decompressed Other: None. IMPRESSION: Mild increased echogenicity consistent with medical renal disease. No obstructive changes are noted. Electronically Signed   By: Inez Catalina M.D.   On: 07/05/2019 13:11   US Abdomen Limited  Result Date: 07/05/2019 CLINICAL DATA:  Abdominal distension, possible ascites EXAM: LIMITED ABDOMEN ULTRASOUND FOR ASCITES TECHNIQUE: Limited ultrasound survey for ascites was performed in all four abdominal quadrants. COMPARISON:  None. FINDINGS: Minimal ascites is noted. Previously seen plain film showed significant small bowel dilatation. IMPRESSION: Minimal ascites. This is not the etiology of the patient's abdominal distension. CT of the abdomen and pelvis is again recommended for further evaluation. Electronically Signed   By: Inez Catalina M.D.   On: 07/05/2019 13:12   US Paracentesis  Result Date: 06/30/2019 INDICATION: Ovarian cancer with ascites. Request for diagnostic and therapeutic paracentesis. EXAM: ULTRASOUND GUIDED PARACENTESIS MEDICATIONS: 1% lidocaine 10 mL COMPLICATIONS: None immediate. PROCEDURE: Informed written consent was obtained from the patient after a discussion of the risks, benefits and alternatives to treatment. A timeout was performed prior to the initiation of the procedure. Initial ultrasound scanning demonstrates a moderate amount of ascites within the  right lower abdominal quadrant. The right lower abdomen was prepped and draped in the usual sterile fashion. 1% lidocaine was used for local anesthesia. Following this, a 6 Fr Safe-T-Centesis catheter was introduced. An ultrasound image was saved for documentation purposes. The paracentesis was performed. The catheter was removed and a dressing was applied. The patient tolerated the procedure well without immediate post procedural complication. FINDINGS: A total of approximately 1 L of clear yellow fluid was removed. Samples were sent to the laboratory as requested by the clinical team. IMPRESSION: Successful ultrasound-guided paracentesis yielding 1 liter of peritoneal fluid. Read by: Gareth Eagle, PA-C Electronically Signed   By: Marcello Moores  Register  On: 06/30/2019 11:21   DG ABD ACUTE 2+V W 1V CHEST  Result Date: 07/06/2019 CLINICAL DATA:  Small-bowel obstruction and pneumoperitoneum. EXAM: DG ABDOMEN ACUTE W/ 1V CHEST COMPARISON:  07/05/2019; CT abdomen and pelvis-07/05/2019 FINDINGS: Redemonstrated marked gaseous distention of multiple loops of small bowel with index loop of small bowel within the right mid hemiabdomen measuring 7.6 cm in diameter. This finding is again associated with a conspicuous paucity of distal colonic gas. Small amount of pneumoperitoneum is seen within the upper abdomen, likely reduced in volume. No pneumatosis or portal venous gas Limited visualization of lower thorax demonstrates sequela of previous paraspinal fusion and corpectomy, incompletely evaluated. Degenerative change the lower lumbar spine is suspected though incompletely evaluated. IMPRESSION: 1. Similar findings worrisome for high-grade small bowel obstruction. 2. Small amount of pneumoperitoneum, likely reduced in volume compared to the 07/05/2019 examination. Electronically Signed   By: Sandi Mariscal M.D.   On: 07/06/2019 07:58   DG Abd Portable 1V  Result Date: 07/07/2019 CLINICAL DATA:  Small bowel obstruction.   Pneumoperitoneum EXAM: PORTABLE ABDOMEN - 1 VIEW COMPARISON:  CT from 2 days ago FINDINGS: Continued small bowel obstruction with diffusely gas dilated bowel and thickened appearance of folds. Small volume pneumoperitoneum is likely seen below the right liver. The patient is supine, limiting direct comparison with yesterday. IMPRESSION: 1. Ongoing small bowel obstruction. 2. Supine positioning limits comparison with yesterday. Electronically Signed   By: Monte Fantasia M.D.   On: 07/07/2019 08:47   DG Abd Portable 1V  Result Date: 07/05/2019 CLINICAL DATA:  Abdominal distension EXAM: PORTABLE ABDOMEN - 1 VIEW COMPARISON:  06/01/2019 FINDINGS: Scattered large and small bowel gas is noted. Significant small bowel dilatation is noted to 7 cm. There are changes suspicious for free intraperitoneal air with regular sign and right upper quadrant air identified. CT of the abdomen and pelvis is recommended for further evaluation. No bony abnormality is seen. IMPRESSION: Significant dilatation of the small bowel with evidence of free air within the abdomen. CT of the abdomen and pelvis is recommended for further evaluation. Electronically Signed   By: Inez Catalina M.D.   On: 07/05/2019 09:16    PERFORMANCE STATUS (ECOG) : 3 - Symptomatic, >50% confined to bed  Review of Systems Unless otherwise noted, a complete review of systems is negative.  Physical Exam General: NAD, thin, frail-appearing Pulmonary: Unlabored Abdomen: Distended, tympanic Skin: no rashes Neurological: Weakness but otherwise nonfocal  IMPRESSION: Patient says she is comfortable and denies any significant changes or concerns overnight.  She said she had some breakfast this morning.  No nausea or vomiting but I do note increased NGT output.  Patient's abdomen also appears a bit more distended today.  Patient remains on comfort care with plan to transfer to residential hospice when a bed is available.  I spoke with patient's significant  other.  All questions answered.  PLAN: -[place] when a bed is available.    Time Total: 15 minutes  Visit consisted of counseling and education dealing with the complex and emotionally intense issues of symptom management and palliative care in the setting of serious and potentially life-threatening illness.Greater than 50%  of this time was spent counseling and coordinating care related to the above assessment and plan.  Signed by: Altha Harm, PhD, NP-C

## 2019-07-09 NOTE — Progress Notes (Signed)
PROGRESS NOTE    Meredith Jones  [id number] DOB: [birth year] DOA: 07/04/2019 PCP: Mar Daring, PA-C     Brief Narrative:  W72 year old WF PMHx high-grade serous adenocarcinoma stage IV.  SP chemotherapy.  Recent reoccurrence with ascites.  Hypothyroidism.  Type II IDDM.  Anemia chronic kidney disease.  Now presents with complaints of fatigue and tiredness and found to have pneumoperitoneum likely from bowel rupture. General surgery, medical oncology and palliative care consulted. Currently further plan is continue current conservative measures   Subjective: 5/27 A/O x4, negative CP, negative abdominal pain, negative nausea or vomiting.  NG tube was pulled sometime this morning accidentally currently patient tolerating.   Assessment & Plan:   Active Problems:   Adult hypothyroidism   Type 2 diabetes mellitus without complication ([place])   Anemia of chronic disease   Bilateral primary ovarian cancer (HCC)   Weakness   AKI (acute kidney injury) ([place])   Abdominal pain   Palliative care encounter    Pneumoperitoneum without any obvious cause. No evidence of ischemia or bowel pathology. Small bowel obstruction Presents with generalized weakness. X-ray abdomen shows evidence of pneumoperitoneum. CT abdomen with contrast was performed which confirmed the diagnosis although does not identify the location. Patient does not have any evidence of peritonitis or abdominal pain. Patient is very cachectic and debilitated. Per [facility] risk assessment her risk for significant cardiovascular outcome is 2.9%. Current recommendation is to continue conservative measures with n.p.o., IV antibiotics and serial abdominal exam and if the patient's condition deteriorates offer surgery.  Appreciate medical oncology's assistance in this condition. Medical oncology recommended to initiate the patient on Decadron and currently holding after 1 dose. They also recommended to start the patient  on octreotide. Patient was also recommended to be on Reglan which I have discontinued given her bowel perforation. NG tube was inserted but patient pulled out it twice on 07/06/2019. NG tube was reinserted on 07/06/2019.  Following x-ray was actually showing worsening pneumoperitoneum. Discussed with medical oncology as well as general surgery and recommend continue conservative measures. No change in patient's condition patient does not have any symptoms right now. COMFORT CARE    Type II controlled with complication -[id number] hemoglobin A1c= 6.5 COMFORT CARE  4.  Hypothyroidism Change oral Synthroid to IV Synthroid. COMFORT CARE  5.  Acute kidney injury In the setting of bowel perforation. Improving. Transition to LR  Monitor renal function avoid nephrotoxic medications.  6.  UTI. Concern for intra-abdominal infection given her pneumoperitoneum Initially on IV ceftriaxone. Changed to IV meropenem on 07/05/2019.  Goals of care discussion Had multiple goals of care discussion with this patient Palliative care following with the patient as well.  Son currently DNR/DNI with a goal to continue to treat what is treatable. Ideally patient will benefit from transitioning to comfort care approach only.  Goals of care; 5/26 COMFORT CARE   DVT prophylaxis:  Code Status: Comfort care Family Communication:  Disposition Plan:  Status is: Inpatient    Dispo: The patient is from: Home              Anticipated d/c is to: Residential hospice              Anticipated d/c date is: Unknown              Patient currently unstable      Consultants:    Procedures/Significant Events:    I have personally reviewed and interpreted all radiology studies and my findings are as  above.  VENTILATOR SETTINGS:    Cultures   Antimicrobials:    Devices    LINES / TUBES:      Continuous Infusions: . lactated ringers 75 mL/hr at 07/08/19 0820     Objective: Vitals:    07/08/19 0730 07/08/19 1552 07/08/19 2304 07/09/19 0737  BP: 112/76 106/72 103/71 122/75  Pulse: 82 81 87 93  Resp: 20 18 16 18   Temp: 97.6 F (36.4 C) 98.3 F (36.8 C) 98.7 F (37.1 C) 97.9 F (36.6 C)  TempSrc: Oral Oral Oral Oral  SpO2: 100% 98% 96% 98%  Weight:      Height:        Intake/Output Summary (Last 24 hours) at 07/09/2019 0845 Last data filed at 07/08/2019 1827 Gross per 24 hour  Intake 720 ml  Output 0 ml  Net 720 ml   Filed Weights   07/04/19 1628 07/05/19 1057  Weight: 54.2 kg 53.2 kg    Examination:  General: No acute respiratory distress Eyes: negative scleral hemorrhage, negative anisocoria, negative icterus ENT: Negative Runny nose, negative gingival bleeding,,  Neck:  Negative scars, masses, torticollis, lymphadenopathy, JVD Lungs: Clear to auscultation bilaterally without wheezes or crackles Cardiovascular: Regular rate and rhythm without murmur gallop or rub normal S1 and S2 Abdomen: negative abdominal pain, positive distention, positive soft, bowel sounds, no rebound, no ascites, no appreciable mass Extremities: No significant cyanosis, clubbing, or edema bilateral lower extremities Skin: Negative rashes, lesions, ulcers Psychiatric:  Negative depression, negative anxiety, negative fatigue, negative mania  Central nervous system:  Cranial nerves II through XII intact, tongue/uvula midline, all extremities muscle strength 5/5, sensation intact throughout, negative dysarthria, negative expressive aphasia, negative receptive aphasia.  .     Data Reviewed: Care during the described time interval was provided by me .  I have reviewed this patient's available data, including medical history, events of note, physical examination, and all test results as part of my evaluation.  CBC: Recent Labs  Lab 07/04/19 1631 07/05/19 0717 07/06/19 0623 07/07/19 0748 07/08/19 0505  WBC 10.8* 9.7 6.5 6.8 5.3  NEUTROABS 9.8*  --   --  5.6 4.1  HGB 9.8* 9.4*  10.0* 9.3* 9.7*  HCT 28.1* 27.4* 30.4* 27.7* 29.5*  MCV 96.9 100.0 103.4* 101.5* 103.1*  PLT 169 165 152 137* 0000000*   Basic Metabolic Panel: Recent Labs  Lab 07/04/19 1631 07/05/19 0717 07/06/19 0754 07/07/19 0748 07/07/19 0752 07/08/19 0505  NA 133* 134* 135  --  137 139  K 3.3* 3.0* 2.7*  --  4.5 3.9  CL 97* 101 102  --  106 107  CO2 20* 17* 23  --  22 23  GLUCOSE 98 127* 113*  --  141* 129*  BUN 75* 78* 49*  --  36* 27*  CREATININE 1.67* 1.54* 0.96  --  0.81 0.67  CALCIUM 8.0* 7.8* 7.8*  --  7.7* 7.8*  MG  --   --   --  2.0  --  1.9  PHOS  --   --  2.5  --   --   --    GFR: Estimated Creatinine Clearance: 59.7 mL/min (by C-G formula based on SCr of 0.67 mg/dL). Liver Function Tests: Recent Labs  Lab 07/04/19 1631 07/06/19 0754 07/07/19 0752 07/08/19 0505  AST 25  --  16 17  ALT 19  --  12 13  ALKPHOS 88  --  85 75  BILITOT 1.0  --  0.9 1.2  PROT 5.5*  --  5.4* 5.4*  ALBUMIN 2.0* 1.9* 1.7* 1.8*   No results for input(s): LIPASE, AMYLASE in the last 168 hours. No results for input(s): AMMONIA in the last 168 hours. Coagulation Profile: No results for input(s): INR, PROTIME in the last 168 hours. Cardiac Enzymes: No results for input(s): CKTOTAL, CKMB, CKMBINDEX, TROPONINI in the last 168 hours. BNP (last 3 results) No results for input(s): PROBNP in the last 8760 hours. HbA1C: No results for input(s): HGBA1C in the last 72 hours. CBG: Recent Labs  Lab 07/08/19 0801 07/08/19 1136 07/08/19 1630 07/08/19 2152 07/09/19 0815  GLUCAP 105* 101* 132* 147* 128*   Lipid Profile: No results for input(s): CHOL, HDL, LDLCALC, TRIG, CHOLHDL, LDLDIRECT in the last 72 hours. Thyroid Function Tests: No results for input(s): TSH, T4TOTAL, FREET4, T3FREE, THYROIDAB in the last 72 hours. Anemia Panel: No results for input(s): VITAMINB12, FOLATE, FERRITIN, TIBC, IRON, RETICCTPCT in the last 72 hours. Sepsis Labs: No results for input(s): PROCALCITON, LATICACIDVEN in the  last 168 hours.  Recent Results (from the past 240 hour(s))  SARS Coronavirus 2 by RT PCR (hospital order, performed in [facility] hospital lab) Nasopharyngeal Nasopharyngeal Swab     Status: None   Collection Time: 07/04/19  6:12 PM   Specimen: Nasopharyngeal Swab  Result Value Ref Range Status   SARS Coronavirus 2 NEGATIVE NEGATIVE Final    Comment: (NOTE) SARS-CoV-2 target nucleic acids are NOT DETECTED. The SARS-CoV-2 RNA is generally detectable in upper and lower respiratory specimens during the acute phase of infection. The lowest concentration of SARS-CoV-2 viral copies this assay can detect is 250 copies / mL. A negative result does not preclude SARS-CoV-2 infection and should not be used as the sole basis for treatment or other patient management decisions.  A negative result may occur with improper specimen collection / handling, submission of specimen other than nasopharyngeal swab, presence of viral mutation(s) within the areas targeted by this assay, and inadequate number of viral copies (<250 copies / mL). A negative result must be combined with clinical observations, patient history, and epidemiological information. Fact Sheet for Patients:   StrictlyIdeas.no Fact Sheet for Healthcare Providers: BankingDealers.co.za This test is not yet approved or cleared  by the [place] FDA and has been authorized for detection and/or diagnosis of SARS-CoV-2 by FDA under an Emergency Use Authorization (EUA).  This EUA will remain in effect (meaning this test can be used) for the duration of the COVID-19 declaration under Section 564(b)(1) of the Act, 21 U.S.C. section 360bbb-3(b)(1), unless the authorization is terminated or revoked sooner. Performed at [facility], [address], [place], [place] [address]          Radiology Studies: DG Abd 1 View  Addendum Date: 07/07/2019   ADDENDUM REPORT: 07/07/2019  12:54 ADDENDUM: Critical Value/emergent results were called by telephone at the time of interpretation on 07/07/2019 at 12:54 pm to provider [facility] PATEL , who verbally acknowledged these results. Electronically Signed   By: Lowella Grip III M.D.   On: 07/07/2019 12:54   Result Date: 07/07/2019 CLINICAL DATA:  Nasogastric tube placement EXAM: ABDOMEN - 1 VIEW COMPARISON:  Jul 07, 2019 study obtained earlier in the day FINDINGS: Nasogastric tube tip and side port are in the stomach. There are multiple loops of dilated bowel. There is pneumoperitoneum. Lung bases are clear. There is postoperative change in the thoracic spine region. IMPRESSION: Nasogastric tube tip and side port in stomach. Persistent bowel dilatation. Increase in pneumoperitoneum compared to earlier in  the day, a finding concerning for viscus perforation. Lung bases clear. These results will be called to the ordering clinician or representative by the Radiologist Assistant, and communication documented in the PACS or [employer]. Electronically Signed: By: Lowella Grip III M.D. On: 07/07/2019 12:51        Scheduled Meds: . octreotide  100 mcg Subcutaneous Q8H  . ondansetron (ZOFRAN) IV  4 mg Intravenous Q6H  . pantoprazole (PROTONIX) IV  40 mg Intravenous Q24H   Continuous Infusions: . lactated ringers 75 mL/hr at 07/08/19 0820     LOS: 5 days    Time spent:40 min    Ethelwyn Gilbertson, Geraldo Docker, MD [facility] Pager [phone]  If 7PM-7AM, please contact night-coverage [email or web address] Password [facility] 07/09/2019, 8:45 AM

## 2019-07-10 DIAGNOSIS — Z515 Encounter for palliative care: Secondary | ICD-10-CM | POA: Diagnosis not present

## 2019-07-10 DIAGNOSIS — R1013 Epigastric pain: Secondary | ICD-10-CM | POA: Diagnosis not present

## 2019-07-10 NOTE — Progress Notes (Addendum)
[facility]) Hospital Liaison RN note:  Unfortunately, [facility] does not have a bed to offer today. [place] Liaison will keep family and TOC updated daily.  If room becomes available over the holiday weekend, the [place] RN will contact RN and TOC to arrange transfer. Please send discharge summary to [phone]. RN please call report to [phone]  Please call with any hospice related questions or concerns.  Thank you, Margaretmary Eddy, BSN, RN [facility] Liaison [phone]

## 2019-07-10 NOTE — Progress Notes (Signed)
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 She is s/pTAH and BSO on 11/26/2018. She was receiving adjuvant systemic chemotherapy.Patient underwent recent paracentesis with cytology positive for malignancy.  Patient was admitted to the hospital on 07/02/2019 with progressive weakness, decreased oral intake, and worsening abdominal pain.  KUB revealed pneumoperitoneum, which was confirmed on abdominal CT.  Subsequent KUB was concerning for development of high-grade SBO.  Surgery has evaluated and does not feel the patient is a surgical candidate.  She is being managed conservatively.  Palliative care was consulted help address goals.   CODE STATUS: DNR  PAST MEDICAL HISTORY: Past Medical History:  Diagnosis Date   Allergy    Anemia    Complication of anesthesia    Diabetes mellitus without complication ([place])    Diverticulosis    Family history of adverse reaction to anesthesia    mom-n/v   Family history of lung cancer    GERD (gastroesophageal reflux disease)    h/o   Hyperlipidemia    Hypothyroidism    Ovarian cancer ([place]) 08/04/2018   Chemo tx's and Hysterectomy.   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 Pt thinks it  was the right breast but not sure.   CATARACT EXTRACTION Left 2010   Right Eye 2011   COLONOSCOPY WITH PROPOFOL N/A 09/02/2015   Procedure: COLONOSCOPY WITH PROPOFOL;  Surgeon: [name], MD;  Location: [place];  Service: Endoscopy;  Laterality: N/A;  Diabetic - oral meds   ESOPHAGOGASTRODUODENOSCOPY (EGD) WITH PROPOFOL N/A 11/03/2015   Procedure: ESOPHAGOGASTRODUODENOSCOPY (EGD) WITH PROPOFOL;  Surgeon: [name], MD;  Location: [place];  Service: Endoscopy;  Laterality: N/A;   EYE SURGERY Bilateral 06/2014    tear duck catherization ( Dr. Vallarie Mare)   LAPAROSCOPIC APPENDECTOMY  11/26/2018   Procedure: APPENDECTOMY LAPAROSCOPIC;  Surgeon: Ward, Honor Loh, MD;  Location: [facility];  Service: Gynecology;;   POLYPECTOMY  09/02/2015   Procedure: POLYPECTOMY;  Surgeon: [name], MD;  Location: [place];  Service: Endoscopy;;   TONSILLECTOMY  1963    HEMATOLOGY/ONCOLOGY HISTORY:  Oncology History  Serous adenocarcinoma ([place])  08/13/2018 Initial Diagnosis   Serous adenocarcinoma ([place])   08/14/2018 Cancer Staging   Staging form: Exocrine Pancreas, AJCC 8th Edition - Clinical stage from 08/14/2018: Stage III (cT3, cN2, cM0) - Signed by Sindy Guadeloupe, MD on 08/17/2018   08/21/2018 -  Chemotherapy   The patient had palonosetron (ALOXI) injection 0.25 mg, 0.25 mg, Intravenous,  Once, 6 of 6 cycles Administration: 0.25 mg (08/21/2018), 0.25 mg (09/11/2018), 0.25 mg (10/02/2018), 0.25 mg (10/23/2018), 0.25 mg (12/22/2018), 0.25 mg (01/12/2019) pegfilgrastim (NEULASTA ONPRO KIT) injection 6 mg, 6 mg, Subcutaneous, Once, 6 of 6 cycles Administration: 6 mg (08/21/2018), 6  mg (09/11/2018), 6 mg (10/02/2018), 6 mg (10/23/2018), 6 mg (12/22/2018), 6 mg (01/12/2019) bevacizumab (AVASTIN) 800 mg in sodium chloride 0.9 % 100 mL chemo infusion, 15 mg/kg = 800 mg (100 % of original dose 15 mg/kg), Intravenous,  Once, 1 of 1 cycle Dose modification: 15 mg/kg (original dose 15 mg/kg,  Cycle 1) Administration: 800 mg (08/21/2018) CARBOplatin (PARAPLATIN) 430 mg in sodium chloride 0.9 % 250 mL chemo infusion, 430 mg (100 % of original dose 429.5 mg), Intravenous,  Once, 6 of 6 cycles Dose modification:   (original dose 429.5 mg, Cycle 1) Administration: 430 mg (08/21/2018), 430 mg (09/11/2018), 430 mg (10/02/2018), 430 mg (10/23/2018), 390 mg (12/22/2018), 390 mg (01/12/2019) PACLitaxel (TAXOL) 270 mg in sodium chloride 0.9 % 250 mL chemo infusion (> 11m/m2), 175 mg/m2 = 270 mg, Intravenous,  Once, 6 of 6 cycles Dose modification: 160 mg/m2 (original dose 175 mg/m2, Cycle 5, Reason: Other (see comments), Comment: neuropathy) Administration: 270 mg (08/21/2018), 270 mg (09/11/2018), 270 mg (10/02/2018), 270 mg (10/23/2018), 246 mg (12/22/2018), 246 mg (01/12/2019) fosaprepitant (EMEND) 150 mg, dexamethasone (DECADRON) 12 mg in sodium chloride 0.9 % 145 mL IVPB, , Intravenous,  Once, 6 of 6 cycles Administration:  (08/21/2018),  (09/11/2018),  (10/02/2018),  (10/23/2018),  (12/22/2018),  (01/12/2019) bevacizumab-awwb (MVASI) 800 mg in sodium chloride 0.9 % 100 mL chemo infusion, 15 mg/kg = 800 mg (100 % of original dose 15 mg/kg), Intravenous,  Once, 10 of 11 cycles Dose modification: 15 mg/kg (original dose 15 mg/kg, Cycle 2) Administration: 800 mg (09/11/2018), 800 mg (10/02/2018), 800 mg (01/12/2019), 800 mg (02/02/2019), 800 mg (02/23/2019), 800 mg (03/16/2019), 800 mg (04/06/2019), 800 mg (04/27/2019), 800 mg (05/18/2019), 800 mg (06/08/2019)  for chemotherapy treatment.    11/07/2018 Genetic Testing   Negative germline + somatic genetic testing. No pathogenic variants identified on the [facility] and [employer] My[facility] HRD. The [facility] panel offered by [employer]includes sequencing and deletion/duplication testing of the following 35 genes: APC, ATM, AXIN2, BARD1, BMPR1A, BRCA1, BRCA2, BRIP1, CHD1, CDK4, CDKN2A, CHEK2, EPCAM (large rearrangement only), HOXB13, GALNT12, MLH1,  MSH2, MSH3, MSH6, MUTYH, NBN, NTHL1, PALB2, PMS2, PTEN, RAD51C, RAD51D, RNF43, RPS20, SMAD4, STK11, and TP53. Sequencing was performed for select regions of POLE and POLD1, and large rearrangement analysis was performed for select regions of GREM1. The report date is 11/07/2018.    Bilateral primary ovarian cancer ([place]  02/02/2019 Initial Diagnosis   Bilateral primary ovarian cancer ([facility])     ALLERGIES:  is allergic to clarithromycin; doxycycline; and penicillins.  MEDICATIONS:  Current Facility-Administered Medications  Medication Dose Route Frequency Provider Last Rate Last Admin   haloperidol lactate (HALDOL) injection 1 mg  1 mg Intravenous Q6H PRN Kostas Marrow, JKirt Boys NP       lactated ringers infusion   Intravenous Continuous PLavina Hamman MD 75 mL/hr at 07/10/19 0600 Rate Verify at 07/10/19 0600   LORazepam (ATIVAN) injection 1-2 mg  1-2 mg Intravenous Q4H PRN Adryan Shin, JKirt Boys NP       morphine 2 MG/ML injection 2-4 mg  2-4 mg Intravenous Q2H PRN Doretha Goding, JKirt Boys NP   2 mg at 07/10/19 0521   octreotide (SANDOSTATIN) injection 100 mcg  100 mcg Subcutaneous Q8H RSindy Guadeloupe MD   100 mcg at 07/10/19 0529   ondansetron (ZOFRAN) injection 4 mg  4 mg Intravenous Q6H Ainsleigh Kakos, JKirt Boys NP   4 mg at 07/10/19 0521   pantoprazole (PROTONIX) injection 40 mg  40 mg Intravenous  Q24H Lavina Hamman, MD   40 mg at 07/09/19 1512   Facility-Administered Medications Ordered in Other Encounters  Medication Dose Route Frequency Provider Last Rate Last Admin   sodium chloride flush (NS) 0.9 % injection 10 mL  10 mL Intravenous Once Sindy Guadeloupe, MD       sodium chloride flush (NS) 0.9 % injection 10 mL  10 mL Intravenous Once Sindy Guadeloupe, MD       sodium chloride flush (NS) 0.9 % injection 10 mL  10 mL Intravenous Once Sindy Guadeloupe, MD        VITAL SIGNS: BP 122/83 (BP Location: Right Arm)    Pulse (!) 107    Temp 98.2 F (36.8 C) (Oral)    Resp 18    Ht '5\' 4"'$  (1.626 m)     Wt 117 lb 4.6 oz (53.2 kg)    SpO2 100%    BMI 20.13 kg/m  Filed Weights   07/04/19 1628 07/05/19 1057  Weight: 119 lb 8 oz (54.2 kg) 117 lb 4.6 oz (53.2 kg)    Estimated body mass index is 20.13 kg/m as calculated from the following:   Height as of this encounter: '5\' 4"'$  (1.626 m).   Weight as of this encounter: 117 lb 4.6 oz (53.2 kg).  LABS: CBC:    Component Value Date/Time   WBC 5.3 07/08/2019 0505   HGB 9.7 (L) 07/08/2019 0505   HGB 10.2 (L) 05/31/2017 0836   HCT 29.5 (L) 07/08/2019 0505   HCT 31.4 (L) 05/31/2017 0836   PLT 138 (L) 07/08/2019 0505   PLT 374 05/31/2017 0836   MCV 103.1 (H) 07/08/2019 0505   MCV 100 (H) 05/31/2017 0836   NEUTROABS 4.1 07/08/2019 0505   NEUTROABS 3.4 05/31/2017 0836   LYMPHSABS 0.8 07/08/2019 0505   LYMPHSABS 1.0 05/31/2017 0836   MONOABS 0.3 07/08/2019 0505   EOSABS 0.0 07/08/2019 0505   EOSABS 0.4 05/31/2017 0836   BASOSABS 0.0 07/08/2019 0505   BASOSABS 0.0 05/31/2017 0836   Comprehensive Metabolic Panel:    Component Value Date/Time   NA 139 07/08/2019 0505   NA 142 05/31/2017 0836   K 3.9 07/08/2019 0505   CL 107 07/08/2019 0505   CO2 23 07/08/2019 0505   BUN 27 (H) 07/08/2019 0505   BUN 9 05/31/2017 0836   CREATININE 0.67 07/08/2019 0505   CREATININE 0.57 11/13/2016 0933   GLUCOSE 129 (H) 07/08/2019 0505   CALCIUM 7.8 (L) 07/08/2019 0505   AST 17 07/08/2019 0505   ALT 13 07/08/2019 0505   ALKPHOS 75 07/08/2019 0505   BILITOT 1.2 07/08/2019 0505   BILITOT <0.2 05/31/2017 0836   PROT 5.4 (L) 07/08/2019 0505   PROT 6.1 05/31/2017 0836   ALBUMIN 1.8 (L) 07/08/2019 0505   ALBUMIN 3.4 (L) 05/31/2017 0836    RADIOGRAPHIC STUDIES: DG Lumbar Spine 2-3 Views  Result Date: 07/06/2019 CLINICAL DATA:  Back pain EXAM: LUMBAR SPINE - 2-3 VIEW COMPARISON:  Abdominal radiographs-earlier same day; CT abdomen and pelvis-07/05/2019 FINDINGS: There are 6 non rib-bearing lumbar type vertebral bodies. For the purposes of this dictation,  lumbar levels will be labeled L1 through L6. Grade 1 anterolisthesis of L4 upon L5 measuring approximately 3 mm. No definite pars defects Lumbar vertebral body heights appear preserved Mild to moderate multilevel lumbar spine DDD, worse at L5-L6 with disc space height loss, endplate irregularity and sclerosis Redemonstrated marked gaseous distention of multiple loops of small bowel. Small amount of pneumoperitoneum  is seen within the upper abdomen. Regional soft tissues appear normal. IMPRESSION: 1. Transitional anatomy with spinal labeling as above. 2. Mild to moderate multilevel lumbar spine DDD, worse at L5-L6. Electronically Signed   By: Sandi Mariscal M.D.   On: 07/06/2019 08:01   DG Abd 1 View  Addendum Date: 07/07/2019   ADDENDUM REPORT: 07/07/2019 12:54 ADDENDUM: Critical Value/emergent results were called by telephone at the time of interpretation on 07/07/2019 at 12:54 pm to provider [facility] PATEL , who verbally acknowledged these results. Electronically Signed   By: Lowella Grip III M.D.   On: 07/07/2019 12:54   Result Date: 07/07/2019 CLINICAL DATA:  Nasogastric tube placement EXAM: ABDOMEN - 1 VIEW COMPARISON:  Jul 07, 2019 study obtained earlier in the day FINDINGS: Nasogastric tube tip and side port are in the stomach. There are multiple loops of dilated bowel. There is pneumoperitoneum. Lung bases are clear. There is postoperative change in the thoracic spine region. IMPRESSION: Nasogastric tube tip and side port in stomach. Persistent bowel dilatation. Increase in pneumoperitoneum compared to earlier in the day, a finding concerning for viscus perforation. Lung bases clear. These results will be called to the ordering clinician or representative by the Radiologist Assistant, and communication documented in the PACS or [employer]. Electronically Signed: By: Lowella Grip III M.D. On: 07/07/2019 12:51   DG Abd 1 View  Result Date: 07/06/2019 CLINICAL DATA:  Check gastric catheter  placement EXAM: ABDOMEN - 1 VIEW COMPARISON:  Film from earlier in the same day. FINDINGS: Persistent small bowel obstructive change is noted stable from the prior examination. Gastric catheter has been advanced with the tip in the stomach although the proximal side port lies in the distal esophagus and should be advanced several cm. Increase in the degree of free air is noted beneath the diaphragms. IMPRESSION: Gastric catheter with the tip in the stomach. This should be advanced several cm further into the stomach. Persistent small bowel obstructive change. Increasing pneumoperitoneum. These results will be called to the ordering clinician or representative by the Radiologist Assistant, and communication documented in the PACS or [employer]. Electronically Signed   By: Inez Catalina M.D.   On: 07/06/2019 13:26   CT HEAD WO CONTRAST  Result Date: 07/06/2019 CLINICAL DATA:  [place], altered level of consciousness EXAM: CT HEAD WITHOUT CONTRAST TECHNIQUE: Contiguous axial images were obtained from the base of the skull through the vertex without intravenous contrast. COMPARISON:  None. FINDINGS: Brain: No acute infarct or hemorrhage. Lateral ventricles and midline structures are unremarkable. No acute extra-axial fluid collections. No mass effect. Vascular: No hyperdense vessel or unexpected calcification. Skull: Normal. Negative for fracture or focal lesion. Sinuses/Orbits: No acute finding. Other: None. IMPRESSION: 1. No acute intracranial process. Electronically Signed   By: Randa Ngo M.D.   On: 07/06/2019 20:56   CT ABDOMEN PELVIS W CONTRAST  Result Date: 07/05/2019 CLINICAL DATA:  History of ovarian cancer, free air in the abdomen on prior radiographs EXAM: CT ABDOMEN AND PELVIS WITH CONTRAST TECHNIQUE: Multidetector CT imaging of the abdomen and pelvis was performed using the standard protocol following bolus administration of intravenous contrast. CONTRAST:  67m OMNIPAQUE IOHEXOL 300 MG/ML   SOLN COMPARISON:  Same-day abdominal radiographs, CT abdomen pelvis, 06/01/2019 FINDINGS: Lower chest: No acute abnormality. Hepatobiliary: No solid liver abnormality is seen. No gallstones, gallbladder wall thickening, or biliary dilatation. Pancreas: Unremarkable. No pancreatic ductal dilatation or surrounding inflammatory changes. Spleen: Normal in size without significant abnormality. Adrenals/Urinary Tract: Adrenal glands  are unremarkable. Kidneys are normal, without renal calculi, solid lesion, or hydronephrosis. Bladder is unremarkable. Stomach/Bowel: Stomach is within normal limits. The small bowel is diffusely fluid-filled, somewhat thickened and hyperenhancing although not overtly distended. The colon is generally decompressed with scattered gas present to the rectum. Sigmoid diverticulosis. Vascular/Lymphatic: Aortic atherosclerosis. No enlarged abdominal or pelvic lymph nodes. Reproductive: Status post hysterectomy and oophorectomy. Other: No abdominal wall hernia or abnormality. Anasarca. Small volume pneumoperitoneum. Small volume ascites throughout the abdomen and pelvis. There are minimal areas of peritoneal thickening and nodularity, example in the right paracolic gutter, consistent with peritoneal metastatic disease. Musculoskeletal: No acute or significant osseous findings. IMPRESSION: 1. Small volume pneumoperitoneum, in keeping with findings of prior radiographs. 2. The small bowel is diffusely fluid-filled, somewhat thickened and hyperenhancing, although not overtly distended. There is no pneumatosis, portal venous gas, or other specific findings to localize source of pneumoperitoneum. 3. 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 4.  Diverticulosis without evidence of acute diverticulitis. 5.  Aortic Atherosclerosis (ICD10-I70.0). Findings discussed by telephone with Dr.  Dahlia Byes at the time of interpretation. Electronically Signed   By: Eddie Candle M.D.   On: 07/05/2019 17:32   US RENAL  Result Date: 07/05/2019 CLINICAL DATA:  Acute renal injury, history of ovarian carcinoma EXAM: RENAL / URINARY TRACT ULTRASOUND COMPLETE COMPARISON:  None. FINDINGS: Right Kidney: Renal measurements: 9.9 x 3.8 x 5.2 cm. = volume: 104 mL. Mild increased echogenicity is noted. No mass lesion or hydronephrosis is seen. Left Kidney: Renal measurements: 9.6 x 4.9 x 5.1 cm. = volume: 128 mL. Increased echogenicity is noted. No mass lesion or hydronephrosis is noted. Bladder: Partially decompressed Other: None. IMPRESSION: Mild increased echogenicity consistent with medical renal disease. No obstructive changes are noted. Electronically Signed   By: Inez Catalina M.D.   On: 07/05/2019 13:11   US Abdomen Limited  Result Date: 07/05/2019 CLINICAL DATA:  Abdominal distension, possible ascites EXAM: LIMITED ABDOMEN ULTRASOUND FOR ASCITES TECHNIQUE: Limited ultrasound survey for ascites was performed in all four abdominal quadrants. COMPARISON:  None. FINDINGS: Minimal ascites is noted. Previously seen plain film showed significant small bowel dilatation. IMPRESSION: Minimal ascites. This is not the etiology of the patient's abdominal distension. CT of the abdomen and pelvis is again recommended for further evaluation. Electronically Signed   By: Inez Catalina M.D.   On: 07/05/2019 13:12   US Paracentesis  Result Date: 06/30/2019 INDICATION: Ovarian cancer with ascites. Request for diagnostic and therapeutic paracentesis. EXAM: ULTRASOUND GUIDED PARACENTESIS MEDICATIONS: 1% lidocaine 10 mL COMPLICATIONS: None immediate. PROCEDURE: Informed written consent was obtained from the patient after a discussion of the risks, benefits and alternatives to treatment. A timeout was performed prior to the initiation of the procedure. Initial ultrasound scanning demonstrates a moderate amount of ascites within the  right lower abdominal quadrant. The right lower abdomen was prepped and draped in the usual sterile fashion. 1% lidocaine was used for local anesthesia. Following this, a 6 Fr Safe-T-Centesis catheter was introduced. An ultrasound image was saved for documentation purposes. The paracentesis was performed. The catheter was removed and a dressing was applied. The patient tolerated the procedure well without immediate post procedural complication. FINDINGS: A total of approximately 1 L of clear yellow fluid was removed. Samples were sent to the laboratory as requested by the clinical team. IMPRESSION: Successful ultrasound-guided paracentesis yielding 1 liter of peritoneal fluid. Read by: Gareth Eagle, PA-C Electronically  Signed   ByMarcello Moores  Register   On: 06/30/2019 11:21   DG ABD ACUTE 2+V W 1V CHEST  Result Date: 07/06/2019 CLINICAL DATA:  Small-bowel obstruction and pneumoperitoneum. EXAM: DG ABDOMEN ACUTE W/ 1V CHEST COMPARISON:  07/05/2019; CT abdomen and pelvis-07/05/2019 FINDINGS: Redemonstrated marked gaseous distention of multiple loops of small bowel with index loop of small bowel within the right mid hemiabdomen measuring 7.6 cm in diameter. This finding is again associated with a conspicuous paucity of distal colonic gas. Small amount of pneumoperitoneum is seen within the upper abdomen, likely reduced in volume. No pneumatosis or portal venous gas Limited visualization of lower thorax demonstrates sequela of previous paraspinal fusion and corpectomy, incompletely evaluated. Degenerative change the lower lumbar spine is suspected though incompletely evaluated. IMPRESSION: 1. Similar findings worrisome for high-grade small bowel obstruction. 2. Small amount of pneumoperitoneum, likely reduced in volume compared to the 07/05/2019 examination. Electronically Signed   By: Sandi Mariscal M.D.   On: 07/06/2019 07:58   DG Abd Portable 1V  Result Date: 07/07/2019 CLINICAL DATA:  Small bowel obstruction.   Pneumoperitoneum EXAM: PORTABLE ABDOMEN - 1 VIEW COMPARISON:  CT from 2 days ago FINDINGS: Continued small bowel obstruction with diffusely gas dilated bowel and thickened appearance of folds. Small volume pneumoperitoneum is likely seen below the right liver. The patient is supine, limiting direct comparison with yesterday. IMPRESSION: 1. Ongoing small bowel obstruction. 2. Supine positioning limits comparison with yesterday. Electronically Signed   By: Monte Fantasia M.D.   On: 07/07/2019 08:47   DG Abd Portable 1V  Result Date: 07/05/2019 CLINICAL DATA:  Abdominal distension EXAM: PORTABLE ABDOMEN - 1 VIEW COMPARISON:  06/01/2019 FINDINGS: Scattered large and small bowel gas is noted. Significant small bowel dilatation is noted to 7 cm. There are changes suspicious for free intraperitoneal air with regular sign and right upper quadrant air identified. CT of the abdomen and pelvis is recommended for further evaluation. No bony abnormality is seen. IMPRESSION: Significant dilatation of the small bowel with evidence of free air within the abdomen. CT of the abdomen and pelvis is recommended for further evaluation. Electronically Signed   By: Inez Catalina M.D.   On: 07/05/2019 09:16    PERFORMANCE STATUS (ECOG) : 3 - Symptomatic, >50% confined to bed  Review of Systems Unless otherwise noted, a complete review of systems is negative.  Physical Exam General: NAD, thin, frail-appearing Pulmonary: Unlabored Abdomen: Distended, tympanic Skin: no rashes Neurological: Weakness but otherwise nonfocal  IMPRESSION: Patient reports being comfortable today.  She denies any significant changes or concerns.  She is tolerating oral intake but abdominal distention persists.  No BM or recent N/V. Yesterday, her NGT was removed.  No plans to replace it.  Patient is awaiting bed at the hospice home.  PLAN: -[place] when a bed is available.    Time Total: 15 minutes  Visit consisted of  counseling and education dealing with the complex and emotionally intense issues of symptom management and palliative care in the setting of serious and potentially life-threatening illness.Greater than 50%  of this time was spent counseling and coordinating care related to the above assessment and plan.  Signed by: Altha Harm, PhD, NP-C

## 2019-07-10 NOTE — TOC Progression Note (Signed)
Transition of Care [facility]) - Progression Note    Patient Details  Name: Meredith Jones MRN: [id number] Date of Birth: 1955-12-19  [facility]) CM/SW Contact  Saysha Menta, Gardiner Rhyme, LCSW Phone Number: 07/10/2019, 9:25 AM  Clinical Narrative:   Spoke with [facility] no hospice beds today for pt. Will ask weekend coverage to check tomorrow.    Expected Discharge Plan: [place] Barriers to Discharge: Continued Medical Work up  Expected Discharge Plan and Services Expected Discharge Plan: [place] In-house Referral: Clinical Social Work, Clinical biochemist   Post Acute Care Choice: Home Health, Durable Medical Equipment Living arrangements for the past 2 months: Single Family Home                                       Social Determinants of Health (SDOH) Interventions    Readmission Risk Interventions No flowsheet data found.

## 2019-07-10 NOTE — Progress Notes (Signed)
PROGRESS NOTE    Meredith Jones  [id number] DOB: 01/20/1956 DOA: 07/04/2019 PCP: Mar Daring, PA-C     Brief Narrative:  W41 year old WF PMHx high-grade serous adenocarcinoma stage IV.  SP chemotherapy.  Recent reoccurrence with ascites.  Hypothyroidism.  Type II IDDM.  Anemia chronic kidney disease.  Now presents with complaints of fatigue and tiredness and found to have pneumoperitoneum likely from bowel rupture. General surgery, medical oncology and palliative care consulted. Currently further plan is continue current conservative measures   Subjective: 5/28 A/O x4 patient still without nausea or vomiting with sips of water and ginger ale.     Assessment & Plan:   Active Problems:   Adult hypothyroidism   Type 2 diabetes mellitus without complication ([place])   Anemia of chronic disease   Bilateral primary ovarian cancer (HCC)   Weakness   AKI (acute kidney injury) ([place])   Abdominal pain   Palliative care encounter    Pneumoperitoneum without any obvious cause. No evidence of ischemia or bowel pathology. Small bowel obstruction Presents with generalized weakness. X-ray abdomen shows evidence of pneumoperitoneum. CT abdomen with contrast was performed which confirmed the diagnosis although does not identify the location. Patient does not have any evidence of peritonitis or abdominal pain. Patient is very cachectic and debilitated. Per [facility] Safe risk assessment her risk for significant cardiovascular outcome is 2.9%. Current recommendation is to continue conservative measures with n.p.o., IV antibiotics and serial abdominal exam and if the patient's condition deteriorates offer surgery.  Appreciate medical oncology's assistance in this condition. Medical oncology recommended to initiate the patient on Decadron and currently holding after 1 dose. They also recommended to start the patient on octreotide. Patient was also recommended to be on Reglan which I have  discontinued given her bowel perforation. NG tube was inserted but patient pulled out it twice on 07/06/2019. NG tube was reinserted on 07/06/2019.  Following x-ray was actually showing worsening pneumoperitoneum. Discussed with medical oncology as well as general surgery and recommend continue conservative measures. No change in patient's condition patient does not have any symptoms right now. COMFORT CARE    Type II controlled with complication -[id number] hemoglobin A1c= 6.5 COMFORT CARE  Hypothyroidism -Discontinued Synthroid COMFORT CARE  AKI -Labs have been discontinued COMFORT CARE  UTI/intra abdominal infection? -Antibiotics discontinued COMFORT CARE  Goals of care discussion Had multiple goals of care discussion with this patient Palliative care following with the patient as well.  Son currently DNR/DNI with a goal to continue to treat what is treatable. Ideally patient will benefit from transitioning to comfort care approach only.  Goals of care; 5/26 COMFORT CARE   DVT prophylaxis:  Code Status: Comfort care Family Communication:  Disposition Plan:  Status is: Inpatient    Dispo: The patient is from: Home              Anticipated d/c is to: Residential hospice              Anticipated d/c date is: Unknown              Patient currently unstable      Consultants:    Procedures/Significant Events:    I have personally reviewed and interpreted all radiology studies and my findings are as above.  VENTILATOR SETTINGS:    Cultures   Antimicrobials:    Devices    LINES / TUBES:      Continuous Infusions: . lactated ringers 75 mL/hr at 07/10/19 1417  Objective: Vitals:   07/09/19 1134 07/09/19 1526 07/09/19 2309 07/10/19 0826  BP: 112/67 118/75 122/84 122/83  Pulse: 83 82 (!) 109 (!) 107  Resp: 18 18 19 18   Temp: (!) 97.4 F (36.3 C) 97.7 F (36.5 C) 98.6 F (37 C) 98.2 F (36.8 C)  TempSrc: Oral Oral Oral Oral  SpO2:  98% 95% 96% 100%  Weight:      Height:        Intake/Output Summary (Last 24 hours) at 07/10/2019 1753 Last data filed at 07/10/2019 1505 Gross per 24 hour  Intake 1620.77 ml  Output --  Net 1620.77 ml   Filed Weights   07/04/19 1628 07/05/19 1057  Weight: 54.2 kg 53.2 kg    Examination:  General: No acute respiratory distress Eyes: negative scleral hemorrhage, negative anisocoria, negative icterus ENT: Negative Runny nose, negative gingival bleeding,,  Neck:  Negative scars, masses, torticollis, lymphadenopathy, JVD Lungs: Clear to auscultation bilaterally without wheezes or crackles Cardiovascular: Regular rate and rhythm without murmur gallop or rub normal S1 and S2 Abdomen: negative abdominal pain, positive distention, positive soft, bowel sounds, no rebound, no ascites, no appreciable mass Extremities: No significant cyanosis, clubbing, or edema bilateral lower extremities Skin: Negative rashes, lesions, ulcers Psychiatric:  Negative depression, negative anxiety, negative fatigue, negative mania  Central nervous system:  Cranial nerves II through XII intact, tongue/uvula midline, all extremities muscle strength 5/5, sensation intact throughout, negative dysarthria, negative expressive aphasia, negative receptive aphasia.  .     Data Reviewed: Care during the described time interval was provided by me .  I have reviewed this patient's available data, including medical history, events of note, physical examination, and all test results as part of my evaluation.  CBC: Recent Labs  Lab 07/04/19 1631 07/05/19 0717 07/06/19 0623 07/07/19 0748 07/08/19 0505  WBC 10.8* 9.7 6.5 6.8 5.3  NEUTROABS 9.8*  --   --  5.6 4.1  HGB 9.8* 9.4* 10.0* 9.3* 9.7*  HCT 28.1* 27.4* 30.4* 27.7* 29.5*  MCV 96.9 100.0 103.4* 101.5* 103.1*  PLT 169 165 152 137* 0000000*   Basic Metabolic Panel: Recent Labs  Lab 07/04/19 1631 07/05/19 0717 07/06/19 0754 07/07/19 0748 07/07/19 0752  07/08/19 0505  NA 133* 134* 135  --  137 139  K 3.3* 3.0* 2.7*  --  4.5 3.9  CL 97* 101 102  --  106 107  CO2 20* 17* 23  --  22 23  GLUCOSE 98 127* 113*  --  141* 129*  BUN 75* 78* 49*  --  36* 27*  CREATININE 1.67* 1.54* 0.96  --  0.81 0.67  CALCIUM 8.0* 7.8* 7.8*  --  7.7* 7.8*  MG  --   --   --  2.0  --  1.9  PHOS  --   --  2.5  --   --   --    GFR: Estimated Creatinine Clearance: 59.7 mL/min (by C-G formula based on SCr of 0.67 mg/dL). Liver Function Tests: Recent Labs  Lab 07/04/19 1631 07/06/19 0754 07/07/19 0752 07/08/19 0505  AST 25  --  16 17  ALT 19  --  12 13  ALKPHOS 88  --  85 75  BILITOT 1.0  --  0.9 1.2  PROT 5.5*  --  5.4* 5.4*  ALBUMIN 2.0* 1.9* 1.7* 1.8*   No results for input(s): LIPASE, AMYLASE in the last 168 hours. No results for input(s): AMMONIA in the last 168 hours. Coagulation Profile: No results for  input(s): INR, PROTIME in the last 168 hours. Cardiac Enzymes: No results for input(s): CKTOTAL, CKMB, CKMBINDEX, TROPONINI in the last 168 hours. BNP (last 3 results) No results for input(s): PROBNP in the last 8760 hours. HbA1C: No results for input(s): HGBA1C in the last 72 hours. CBG: Recent Labs  Lab 07/08/19 0801 07/08/19 1136 07/08/19 1630 07/08/19 2152 07/09/19 0815  GLUCAP 105* 101* 132* 147* 128*   Lipid Profile: No results for input(s): CHOL, HDL, LDLCALC, TRIG, CHOLHDL, LDLDIRECT in the last 72 hours. Thyroid Function Tests: No results for input(s): TSH, T4TOTAL, FREET4, T3FREE, THYROIDAB in the last 72 hours. Anemia Panel: No results for input(s): VITAMINB12, FOLATE, FERRITIN, TIBC, IRON, RETICCTPCT in the last 72 hours. Sepsis Labs: No results for input(s): PROCALCITON, LATICACIDVEN in the last 168 hours.  Recent Results (from the past 240 hour(s))  SARS Coronavirus 2 by RT PCR (hospital order, performed in [facility] hospital lab) Nasopharyngeal Nasopharyngeal Swab     Status: None   Collection Time: 07/04/19  6:12 PM    Specimen: Nasopharyngeal Swab  Result Value Ref Range Status   SARS Coronavirus 2 NEGATIVE NEGATIVE Final    Comment: (NOTE) SARS-CoV-2 target nucleic acids are NOT DETECTED. The SARS-CoV-2 RNA is generally detectable in upper and lower respiratory specimens during the acute phase of infection. The lowest concentration of SARS-CoV-2 viral copies this assay can detect is 250 copies / mL. A negative result does not preclude SARS-CoV-2 infection and should not be used as the sole basis for treatment or other patient management decisions.  A negative result may occur with improper specimen collection / handling, submission of specimen other than nasopharyngeal swab, presence of viral mutation(s) within the areas targeted by this assay, and inadequate number of viral copies (<250 copies / mL). A negative result must be combined with clinical observations, patient history, and epidemiological information. Fact Sheet for Patients:   StrictlyIdeas.no Fact Sheet for Healthcare Providers: BankingDealers.co.za This test is not yet approved or cleared  by the [place] FDA and has been authorized for detection and/or diagnosis of SARS-CoV-2 by FDA under an Emergency Use Authorization (EUA).  This EUA will remain in effect (meaning this test can be used) for the duration of the COVID-19 declaration under Section 564(b)(1) of the Act, 21 U.S.C. section 360bbb-3(b)(1), unless the authorization is terminated or revoked sooner. Performed at [facility], [address], [place], [place] [address]          Radiology Studies: No results found.      Scheduled Meds: . octreotide  100 mcg Subcutaneous Q8H  . ondansetron (ZOFRAN) IV  4 mg Intravenous Q6H  . pantoprazole (PROTONIX) IV  40 mg Intravenous Q24H   Continuous Infusions: . lactated ringers 75 mL/hr at 07/10/19 1417     LOS: 6 days    Time spent:40  min    Danica Camarena, Geraldo Docker, MD [facility] Pager [phone]  If 7PM-7AM, please contact night-coverage [email or web address] Password [facility] 07/10/2019, 5:53 PM

## 2019-07-11 DIAGNOSIS — R180 Malignant ascites: Secondary | ICD-10-CM

## 2019-07-11 DIAGNOSIS — R1013 Epigastric pain: Secondary | ICD-10-CM | POA: Diagnosis not present

## 2019-07-11 DIAGNOSIS — D638 Anemia in other chronic diseases classified elsewhere: Secondary | ICD-10-CM | POA: Diagnosis not present

## 2019-07-11 DIAGNOSIS — C561 Malignant neoplasm of right ovary: Secondary | ICD-10-CM | POA: Diagnosis not present

## 2019-07-11 MED ORDER — OCTREOTIDE ACETATE 100 MCG/ML IJ SOLN: 100.0000 ug | Freq: Three times a day (TID) | INTRAMUSCULAR | 0 refills | Status: AC

## 2019-07-11 MED ORDER — MORPHINE SULFATE 2 MG/ML IJ SOLN: 2.0000 mg | INTRAMUSCULAR | 0 refills | Status: AC | PRN

## 2019-07-11 MED ORDER — PANTOPRAZOLE SODIUM 40 MG IV SOLR: 40.0000 mg | INTRAVENOUS | 0 refills | Status: AC

## 2019-07-11 MED ORDER — ONDANSETRON HCL 4 MG/2ML IJ SOLN: 4.0000 mg | Freq: Four times a day (QID) | INTRAMUSCULAR | 0 refills | Status: AC

## 2019-07-11 MED ORDER — HALOPERIDOL LACTATE 5 MG/ML IJ SOLN: 1.0000 mg | Freq: Four times a day (QID) | INTRAMUSCULAR | 0 refills | Status: AC | PRN

## 2019-07-11 MED ORDER — LORAZEPAM 2 MG/ML IJ SOLN: 1.0000 mg | INTRAMUSCULAR | 0 refills | Status: AC | PRN

## 2019-07-11 NOTE — Plan of Care (Signed)
Patient discharged to hospice home per MD order. Report called to Shawna Clamp at facility. EMS called for transport.

## 2019-07-11 NOTE — Discharge Summary (Signed)
Physician Discharge Summary  Meredith Jones [id number] DOB: 05-30-1955 DOA: 07/04/2019  PCP: Mar Daring, PA-C  Admit date: 07/04/2019 Discharge date: 07/11/2019  Time spent: 30 minutes  Recommendations for Outpatient Follow-up:   Pneumoperitoneum without any obvious cause. No evidence of ischemia or bowel pathology. Small bowel obstruction Presents with generalized weakness. X-ray abdomen shows evidence of pneumoperitoneum. CT abdomen with contrast was performed which confirmed the diagnosis although does not identify the location. Patient does not have any evidence of peritonitis or abdominal pain. Patient is very cachectic and debilitated. Per [facility] Safe risk assessment her risk for significant cardiovascular outcome is 2.9%. Current recommendation is to continue conservative measures with n.p.o., IV antibiotics and serial abdominal exam and if the patient's condition deteriorates offer surgery.  Appreciate medical oncology's assistance in this condition. Medical oncology recommended to initiate the patient on Decadron and currently holding after 1 dose. They also recommended to start the patient on octreotide. Patient was also recommended to be on Reglan which I have discontinued given her bowel perforation. NG tube was inserted but patient pulled out it twiceon 64/24/2021. NG tube was reinserted on 07/06/2019. Following x-ray was actually showing worsening pneumoperitoneum. Discussed with medical oncology as well as general surgery and recommend continue conservative measures. No change in patient's condition patient does not have any symptoms right now. COMFORT CARE   Type II controlled with complication -[id number] hemoglobin A1c= 6.5 COMFORT CARE  Hypothyroidism -Discontinued Synthroid COMFORT CARE  AKI -Labs have been discontinued COMFORT CARE  UTI/intra abdominal infection? -Antibiotics discontinued COMFORT CARE  Goals of care discussion Had multiple  goals of care discussion with this patient Palliative care following with the patient as well. Son currently DNR/DNI with a goal to continue to treat what is treatable. Ideally patient will benefit from transitioning to comfort care approach only.  Goals of care; 5/26 COMFORT CARE   Discharge Diagnoses:  Active Problems:   Adult hypothyroidism   Type 2 diabetes mellitus without complication (HCC)   Anemia of chronic disease   Bilateral primary ovarian cancer (HCC)   Weakness   AKI (acute kidney injury) ([place])   Abdominal pain   Palliative care encounter   Discharge Condition: Guarded  Diet recommendation: Regular  Filed Weights   07/04/19 1628 07/05/19 1057  Weight: 54.2 kg 53.2 kg    History of present illness:  64 year old WF PMHx high-grade serous adenocarcinoma stage IV. SP chemotherapy. Recent reoccurrence with ascites. Hypothyroidism. Type II IDDM. Anemia chronic kidney disease.  Now presents with complaints of fatigue and tiredness and found to have pneumoperitoneum likely from bowel rupture. General surgery, medical oncologyand palliative careconsulted. Currently further plan is continue currentconservativemeasures  Hospital Course:  See above    Discharge Exam: Vitals:   07/09/19 2309 07/10/19 0826 07/10/19 2314 07/11/19 0733  BP: 122/84 122/83 121/78 119/76  Pulse: (!) 109 (!) 107 93 98  Resp: 19 18 20 16   Temp: 98.6 F (37 C) 98.2 F (36.8 C) 98 F (36.7 C) 97.8 F (36.6 C)  TempSrc: Oral Oral Oral Oral  SpO2: 96% 100% 96% 100%  Weight:      Height:        General: No acute respiratory distress Eyes: negative scleral hemorrhage, negative anisocoria, negative icterus ENT: Negative Runny nose, negative gingival bleeding,,  Neck:  Negative scars, masses, torticollis, lymphadenopathy, JVD Lungs: Clear to auscultation bilaterally without wheezes or crackles Cardiovascular: Regular rate and rhythm without murmur gallop or rub normal S1 and  S2 Abdomen: negative abdominal pain,  positive distention, positive soft, bowel sounds, no rebound, no ascites, no appreciable mass   Discharge Instructions   Allergies as of 07/11/2019      Reactions   Clarithromycin Nausea Only   Doxycycline Nausea Only   dizziness   Penicillins Hives   Did it involve swelling of the face/tongue/throat, SOB, or low BP? No Did it involve sudden or severe rash/hives, skin peeling, or any reaction on the inside of your mouth or nose? No Did you need to seek medical attention at a hospital or doctor's office? No When did it last happen?50+ years ago If all above answers are "NO", may proceed with cephalosporin use.      Medication List    STOP taking these medications   acetaminophen 500 MG tablet Commonly known as: TYLENOL   aspirin 81 MG tablet   calcipotriene 0.005 % ointment Commonly known as: DOVONOX   dexamethasone 4 MG tablet Commonly known as: DECADRON   fexofenadine 60 MG tablet Commonly known as: ALLEGRA   furosemide 20 MG tablet Commonly known as: Lasix   gabapentin 300 MG capsule Commonly known as: Neurontin   glipiZIDE 5 MG tablet Commonly known as: GLUCOTROL   glucose blood test strip Commonly known as: ONE TOUCH ULTRA TEST   ibuprofen 600 MG tablet Commonly known as: ADVIL   levothyroxine 100 MCG tablet Commonly known as: SYNTHROID   LORazepam 0.5 MG tablet Commonly known as: Ativan Replaced by: LORazepam 2 MG/ML injection   metFORMIN 500 MG tablet Commonly known as: GLUCOPHAGE   MULTIPLE VITAMIN PO   ondansetron 8 MG tablet Commonly known as: Zofran Replaced by: ondansetron 4 MG/2ML Soln injection   OneTouch Delica Lancets [id number] Misc   oxyCODONE 5 MG immediate release tablet Commonly known as: Roxicodone   prochlorperazine 10 MG tablet Commonly known as: COMPAZINE   simvastatin 20 MG tablet Commonly known as: ZOCOR   spironolactone 50 MG tablet Commonly known as: ALDACTONE   Tresiba  FlexTouch 100 UNIT/ML FlexTouch Pen Generic drug: insulin degludec   Xiidra 5 % Soln Generic drug: Lifitegrast     TAKE these medications   haloperidol lactate 5 MG/ML injection Commonly known as: HALDOL Inject 0.2 mLs (1 mg total) into the vein every 6 (six) hours as needed.   LORazepam 2 MG/ML injection Commonly known as: ATIVAN Inject 0.5-1 mLs (1-2 mg total) into the vein every 4 (four) hours as needed for anxiety or sedation. Replaces: LORazepam 0.5 MG tablet   morphine 2 MG/ML injection Inject 1-2 mLs (2-4 mg total) into the vein every 2 (two) hours as needed.   octreotide 100 MCG/ML Soln injection Commonly known as: SANDOSTATIN Inject 1 mL (100 mcg total) into the skin every 8 (eight) hours.   ondansetron 4 MG/2ML Soln injection Commonly known as: ZOFRAN Inject 2 mLs (4 mg total) into the vein every 6 (six) hours. Replaces: ondansetron 8 MG tablet   pantoprazole 40 MG injection Commonly known as: PROTONIX Inject 40 mg into the vein daily.      Allergies  Allergen Reactions  . Clarithromycin Nausea Only  . Doxycycline Nausea Only    dizziness  . Penicillins Hives    Did it involve swelling of the face/tongue/throat, SOB, or low BP? No Did it involve sudden or severe rash/hives, skin peeling, or any reaction on the inside of your mouth or nose? No Did you need to seek medical attention at a hospital or doctor's office? No When did it last happen?50+ years ago If all above  answers are "NO", may proceed with cephalosporin use.       The results of significant diagnostics from this hospitalization (including imaging, microbiology, ancillary and laboratory) are listed below for reference.    Significant Diagnostic Studies: DG Lumbar Spine 2-3 Views  Result Date: 07/06/2019 CLINICAL DATA:  Back pain EXAM: LUMBAR SPINE - 2-3 VIEW COMPARISON:  Abdominal radiographs-earlier same day; CT abdomen and pelvis-07/05/2019 FINDINGS: There are 6 non rib-bearing  lumbar type vertebral bodies. For the purposes of this dictation, lumbar levels will be labeled L1 through L6. Grade 1 anterolisthesis of L4 upon L5 measuring approximately 3 mm. No definite pars defects Lumbar vertebral body heights appear preserved Mild to moderate multilevel lumbar spine DDD, worse at L5-L6 with disc space height loss, endplate irregularity and sclerosis Redemonstrated marked gaseous distention of multiple loops of small bowel. Small amount of pneumoperitoneum is seen within the upper abdomen. Regional soft tissues appear normal. IMPRESSION: 1. Transitional anatomy with spinal labeling as above. 2. Mild to moderate multilevel lumbar spine DDD, worse at L5-L6. Electronically Signed   By: Sandi Mariscal M.D.   On: 07/06/2019 08:01   DG Abd 1 View  Addendum Date: 07/07/2019   ADDENDUM REPORT: 07/07/2019 12:54 ADDENDUM: Critical Value/emergent results were called by telephone at the time of interpretation on 07/07/2019 at 12:54 pm to provider [facility] PATEL , who verbally acknowledged these results. Electronically Signed   By: Lowella Grip III M.D.   On: 07/07/2019 12:54   Result Date: 07/07/2019 CLINICAL DATA:  Nasogastric tube placement EXAM: ABDOMEN - 1 VIEW COMPARISON:  Jul 07, 2019 study obtained earlier in the day FINDINGS: Nasogastric tube tip and side port are in the stomach. There are multiple loops of dilated bowel. There is pneumoperitoneum. Lung bases are clear. There is postoperative change in the thoracic spine region. IMPRESSION: Nasogastric tube tip and side port in stomach. Persistent bowel dilatation. Increase in pneumoperitoneum compared to earlier in the day, a finding concerning for viscus perforation. Lung bases clear. These results will be called to the ordering clinician or representative by the Radiologist Assistant, and communication documented in the PACS or [employer]. Electronically Signed: By: Lowella Grip III M.D. On: 07/07/2019 12:51   DG Abd 1  View  Result Date: 07/06/2019 CLINICAL DATA:  Check gastric catheter placement EXAM: ABDOMEN - 1 VIEW COMPARISON:  Film from earlier in the same day. FINDINGS: Persistent small bowel obstructive change is noted stable from the prior examination. Gastric catheter has been advanced with the tip in the stomach although the proximal side port lies in the distal esophagus and should be advanced several cm. Increase in the degree of free air is noted beneath the diaphragms. IMPRESSION: Gastric catheter with the tip in the stomach. This should be advanced several cm further into the stomach. Persistent small bowel obstructive change. Increasing pneumoperitoneum. These results will be called to the ordering clinician or representative by the Radiologist Assistant, and communication documented in the PACS or [employer]. Electronically Signed   By: Inez Catalina M.D.   On: 07/06/2019 13:26   CT HEAD WO CONTRAST  Result Date: 07/06/2019 CLINICAL DATA:  [place], altered level of consciousness EXAM: CT HEAD WITHOUT CONTRAST TECHNIQUE: Contiguous axial images were obtained from the base of the skull through the vertex without intravenous contrast. COMPARISON:  None. FINDINGS: Brain: No acute infarct or hemorrhage. Lateral ventricles and midline structures are unremarkable. No acute extra-axial fluid collections. No mass effect. Vascular: No hyperdense vessel or unexpected calcification. Skull:  Normal. Negative for fracture or focal lesion. Sinuses/Orbits: No acute finding. Other: None. IMPRESSION: 1. No acute intracranial process. Electronically Signed   By: Randa Ngo M.D.   On: 07/06/2019 20:56   CT ABDOMEN PELVIS W CONTRAST  Result Date: 07/05/2019 CLINICAL DATA:  History of ovarian cancer, free air in the abdomen on prior radiographs EXAM: CT ABDOMEN AND PELVIS WITH CONTRAST TECHNIQUE: Multidetector CT imaging of the abdomen and pelvis was performed using the standard protocol following bolus administration of  intravenous contrast. CONTRAST:  48mL OMNIPAQUE IOHEXOL 300 MG/ML  SOLN COMPARISON:  Same-day abdominal radiographs, CT abdomen pelvis, 06/01/2019 FINDINGS: Lower chest: No acute abnormality. Hepatobiliary: No solid liver abnormality is seen. No gallstones, gallbladder wall thickening, or biliary dilatation. Pancreas: Unremarkable. No pancreatic ductal dilatation or surrounding inflammatory changes. Spleen: Normal in size without significant abnormality. Adrenals/Urinary Tract: Adrenal glands are unremarkable. Kidneys are normal, without renal calculi, solid lesion, or hydronephrosis. Bladder is unremarkable. Stomach/Bowel: Stomach is within normal limits. The small bowel is diffusely fluid-filled, somewhat thickened and hyperenhancing although not overtly distended. The colon is generally decompressed with scattered gas present to the rectum. Sigmoid diverticulosis. Vascular/Lymphatic: Aortic atherosclerosis. No enlarged abdominal or pelvic lymph nodes. Reproductive: Status post hysterectomy and oophorectomy. Other: No abdominal wall hernia or abnormality. Anasarca. Small volume pneumoperitoneum. Small volume ascites throughout the abdomen and pelvis. There are minimal areas of peritoneal thickening and nodularity, example in the right paracolic gutter, consistent with peritoneal metastatic disease. Musculoskeletal: No acute or significant osseous findings. IMPRESSION: 1. Small volume pneumoperitoneum, in keeping with findings of prior radiographs. 2. The small bowel is diffusely fluid-filled, somewhat thickened and hyperenhancing, although not overtly distended. There is no pneumatosis, portal venous gas, or other specific findings to localize source of pneumoperitoneum. 3. 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 4.  Diverticulosis without evidence of acute diverticulitis. 5.  Aortic  Atherosclerosis (ICD10-I70.0). Findings discussed by telephone with Dr. Dahlia Byes at the time of interpretation. Electronically Signed   By: Eddie Candle M.D.   On: 07/05/2019 17:32   US RENAL  Result Date: 07/05/2019 CLINICAL DATA:  Acute renal injury, history of ovarian carcinoma EXAM: RENAL / URINARY TRACT ULTRASOUND COMPLETE COMPARISON:  None. FINDINGS: Right Kidney: Renal measurements: 9.9 x 3.8 x 5.2 cm. = volume: 104 mL. Mild increased echogenicity is noted. No mass lesion or hydronephrosis is seen. Left Kidney: Renal measurements: 9.6 x 4.9 x 5.1 cm. = volume: 128 mL. Increased echogenicity is noted. No mass lesion or hydronephrosis is noted. Bladder: Partially decompressed Other: None. IMPRESSION: Mild increased echogenicity consistent with medical renal disease. No obstructive changes are noted. Electronically Signed   By: Inez Catalina M.D.   On: 07/05/2019 13:11   US Abdomen Limited  Result Date: 07/05/2019 CLINICAL DATA:  Abdominal distension, possible ascites EXAM: LIMITED ABDOMEN ULTRASOUND FOR ASCITES TECHNIQUE: Limited ultrasound survey for ascites was performed in all four abdominal quadrants. COMPARISON:  None. FINDINGS: Minimal ascites is noted. Previously seen plain film showed significant small bowel dilatation. IMPRESSION: Minimal ascites. This is not the etiology of the patient's abdominal distension. CT of the abdomen and pelvis is again recommended for further evaluation. Electronically Signed   By: Inez Catalina M.D.   On: 07/05/2019 13:12   US Paracentesis  Result Date: 06/30/2019 INDICATION: Ovarian cancer with ascites. Request for diagnostic and therapeutic paracentesis. EXAM: ULTRASOUND GUIDED PARACENTESIS MEDICATIONS: 1% lidocaine 10 mL COMPLICATIONS: None immediate. PROCEDURE:  Informed written consent was obtained from the patient after a discussion of the risks, benefits and alternatives to treatment. A timeout was performed prior to the initiation of the procedure. Initial  ultrasound scanning demonstrates a moderate amount of ascites within the right lower abdominal quadrant. The right lower abdomen was prepped and draped in the usual sterile fashion. 1% lidocaine was used for local anesthesia. Following this, a 6 Fr Safe-T-Centesis catheter was introduced. An ultrasound image was saved for documentation purposes. The paracentesis was performed. The catheter was removed and a dressing was applied. The patient tolerated the procedure well without immediate post procedural complication. FINDINGS: A total of approximately 1 L of clear yellow fluid was removed. Samples were sent to the laboratory as requested by the clinical team. IMPRESSION: Successful ultrasound-guided paracentesis yielding 1 liter of peritoneal fluid. Read by: Gareth Eagle, PA-C Electronically Signed   By: Marcello Moores  Register   On: 06/30/2019 11:21   DG ABD ACUTE 2+V W 1V CHEST  Result Date: 07/06/2019 CLINICAL DATA:  Small-bowel obstruction and pneumoperitoneum. EXAM: DG ABDOMEN ACUTE W/ 1V CHEST COMPARISON:  07/05/2019; CT abdomen and pelvis-07/05/2019 FINDINGS: Redemonstrated marked gaseous distention of multiple loops of small bowel with index loop of small bowel within the right mid hemiabdomen measuring 7.6 cm in diameter. This finding is again associated with a conspicuous paucity of distal colonic gas. Small amount of pneumoperitoneum is seen within the upper abdomen, likely reduced in volume. No pneumatosis or portal venous gas Limited visualization of lower thorax demonstrates sequela of previous paraspinal fusion and corpectomy, incompletely evaluated. Degenerative change the lower lumbar spine is suspected though incompletely evaluated. IMPRESSION: 1. Similar findings worrisome for high-grade small bowel obstruction. 2. Small amount of pneumoperitoneum, likely reduced in volume compared to the 07/05/2019 examination. Electronically Signed   By: Sandi Mariscal M.D.   On: 07/06/2019 07:58   DG Abd Portable  1V  Result Date: 07/07/2019 CLINICAL DATA:  Small bowel obstruction.  Pneumoperitoneum EXAM: PORTABLE ABDOMEN - 1 VIEW COMPARISON:  CT from 2 days ago FINDINGS: Continued small bowel obstruction with diffusely gas dilated bowel and thickened appearance of folds. Small volume pneumoperitoneum is likely seen below the right liver. The patient is supine, limiting direct comparison with yesterday. IMPRESSION: 1. Ongoing small bowel obstruction. 2. Supine positioning limits comparison with yesterday. Electronically Signed   By: Monte Fantasia M.D.   On: 07/07/2019 08:47   DG Abd Portable 1V  Result Date: 07/05/2019 CLINICAL DATA:  Abdominal distension EXAM: PORTABLE ABDOMEN - 1 VIEW COMPARISON:  06/01/2019 FINDINGS: Scattered large and small bowel gas is noted. Significant small bowel dilatation is noted to 7 cm. There are changes suspicious for free intraperitoneal air with regular sign and right upper quadrant air identified. CT of the abdomen and pelvis is recommended for further evaluation. No bony abnormality is seen. IMPRESSION: Significant dilatation of the small bowel with evidence of free air within the abdomen. CT of the abdomen and pelvis is recommended for further evaluation. Electronically Signed   By: Inez Catalina M.D.   On: 07/05/2019 09:16    Microbiology: Recent Results (from the past 240 hour(s))  SARS Coronavirus 2 by RT PCR (hospital order, performed in [facility] hospital lab) Nasopharyngeal Nasopharyngeal Swab     Status: None   Collection Time: 07/04/19  6:12 PM   Specimen: Nasopharyngeal Swab  Result Value Ref Range Status   SARS Coronavirus 2 NEGATIVE NEGATIVE Final    Comment: (NOTE) SARS-CoV-2 target nucleic acids are NOT DETECTED.  The SARS-CoV-2 RNA is generally detectable in upper and lower respiratory specimens during the acute phase of infection. The lowest concentration of SARS-CoV-2 viral copies this assay can detect is 250 copies / mL. A negative result does not  preclude SARS-CoV-2 infection and should not be used as the sole basis for treatment or other patient management decisions.  A negative result may occur with improper specimen collection / handling, submission of specimen other than nasopharyngeal swab, presence of viral mutation(s) within the areas targeted by this assay, and inadequate number of viral copies (<250 copies / mL). A negative result must be combined with clinical observations, patient history, and epidemiological information. Fact Sheet for Patients:   StrictlyIdeas.no Fact Sheet for Healthcare Providers: BankingDealers.co.za This test is not yet approved or cleared  by the [place] FDA and has been authorized for detection and/or diagnosis of SARS-CoV-2 by FDA under an Emergency Use Authorization (EUA).  This EUA will remain in effect (meaning this test can be used) for the duration of the COVID-19 declaration under Section 564(b)(1) of the Act, 21 U.S.C. section 360bbb-3(b)(1), unless the authorization is terminated or revoked sooner. Performed at [facility], [address] [place], [place] [address]      Labs: Basic Metabolic Panel: Recent Labs  Lab 07/04/19 1631 07/05/19 0717 07/06/19 0754 07/07/19 0748 07/07/19 0752 07/08/19 0505  NA 133* 134* 135  --  137 139  K 3.3* 3.0* 2.7*  --  4.5 3.9  CL 97* 101 102  --  106 107  CO2 20* 17* 23  --  22 23  GLUCOSE 98 127* 113*  --  141* 129*  BUN 75* 78* 49*  --  36* 27*  CREATININE 1.67* 1.54* 0.96  --  0.81 0.67  CALCIUM 8.0* 7.8* 7.8*  --  7.7* 7.8*  MG  --   --   --  2.0  --  1.9  PHOS  --   --  2.5  --   --   --    Liver Function Tests: Recent Labs  Lab 07/04/19 1631 07/06/19 0754 07/07/19 0752 07/08/19 0505  AST 25  --  16 17  ALT 19  --  12 13  ALKPHOS 88  --  85 75  BILITOT 1.0  --  0.9 1.2  PROT 5.5*  --  5.4* 5.4*  ALBUMIN 2.0* 1.9* 1.7* 1.8*   No results for input(s):  LIPASE, AMYLASE in the last 168 hours. No results for input(s): AMMONIA in the last 168 hours. CBC: Recent Labs  Lab 07/04/19 1631 07/05/19 0717 07/06/19 0623 07/07/19 0748 07/08/19 0505  WBC 10.8* 9.7 6.5 6.8 5.3  NEUTROABS 9.8*  --   --  5.6 4.1  HGB 9.8* 9.4* 10.0* 9.3* 9.7*  HCT 28.1* 27.4* 30.4* 27.7* 29.5*  MCV 96.9 100.0 103.4* 101.5* 103.1*  PLT 169 165 152 137* 138*   Cardiac Enzymes: No results for input(s): CKTOTAL, CKMB, CKMBINDEX, TROPONINI in the last 168 hours. BNP: BNP (last 3 results) Recent Labs    07/23/18 1147  BNP 55.0    ProBNP (last 3 results) No results for input(s): PROBNP in the last 8760 hours.  CBG: Recent Labs  Lab 07/08/19 0801 07/08/19 1136 07/08/19 1630 07/08/19 2152 07/09/19 0815  GLUCAP 105* 101* 132* 147* 128*       Signed:  Dia Crawford, MD [facility] [phone] pager

## 2019-07-11 NOTE — TOC Transition Note (Signed)
Transition of Care [facility]) - CM/SW Discharge Note   Patient Details  Name: JAQUISE FEIN MRN: [id number] Date of Birth: 06/10/1955  [facility] CM/SW Contact:  Boris Sharper, LCSW Phone Number: 07/11/2019, 1:50 PM   Clinical Narrative:    Pt medically stable for discharge per MD. Jackelyn Poling from [facility] notified me that a bed was available for the pt. Pt will be transported via EMS to [facility] in room 11 call to report number is [phone]. Pt family notified.    Final next level of care: [place] Barriers to Discharge: No Barriers Identified   Patient Goals and CMS Choice Patient states their goals for this hospitalization and ongoing recovery are:: I plan on going home and hopefully it will be soon CMS Medicare.gov Compare Post Acute Care list provided to:: Patient Choice offered to / list presented to : Patient  Discharge Placement              Patient chooses bed at: Other - please specify in the comment section below:([facility]) Patient to be transferred to facility by: EMS Name of family member notified: Johnny Patient and family notified of of transfer: 07/11/19  Discharge Plan and Services In-house Referral: Clinical Social Work, Freight forwarder Acute Care Choice: Home Health, Durable Medical Equipment                               Social Determinants of Health (SDOH) Interventions     Readmission Risk Interventions No flowsheet data found.

## 2019-08-13 DEATH — deceased

## 2019-08-24 ENCOUNTER — Encounter: Payer: Commercial Managed Care - PPO | Admitting: Hospice and Palliative Medicine

## 2020-04-28 IMAGING — CT CT CHEST W/ CM
2 of 5 series · 11 of 36 positions shown, 13 images · IV contrast (omnipaque)
Comparison: CT the chest, abdomen and pelvis 08/04/2018.

CLINICAL DATA: 63-year-old female with history of ovarian cancer.
Restaging examination.

EXAM:
CT CHEST, ABDOMEN, AND PELVIS WITH CONTRAST
TECHNIQUE: Multidetector CT imaging of the chest, abdomen and pelvis was
performed following the standard protocol during bolus
administration of intravenous contrast.
CONTRAST:  75mL OMNIPAQUE IOHEXOL 300 MG/ML  SOLN

[Series 2: axials cap (person_name) · axial · 0.66mm/px · z∈[-1513,-973]mm · 8 of 136 slices shown, 10 images]
[im 14/136  mediastinal]
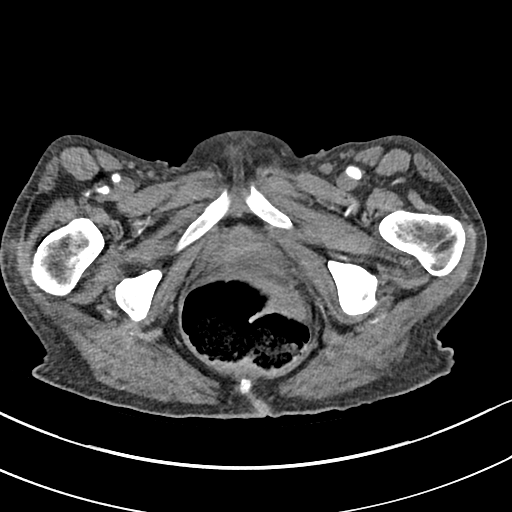
[im 14/136  lung]
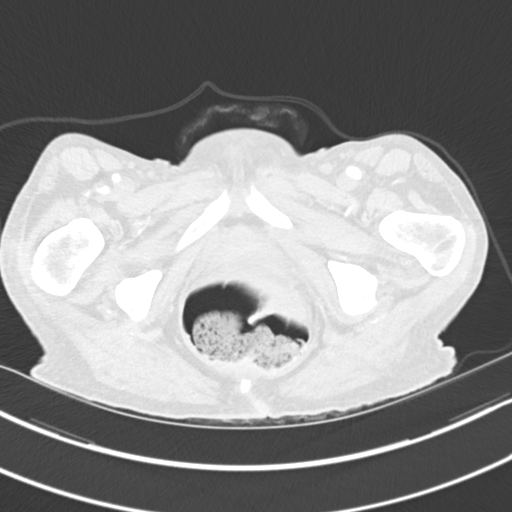
[im 28/136  lung]
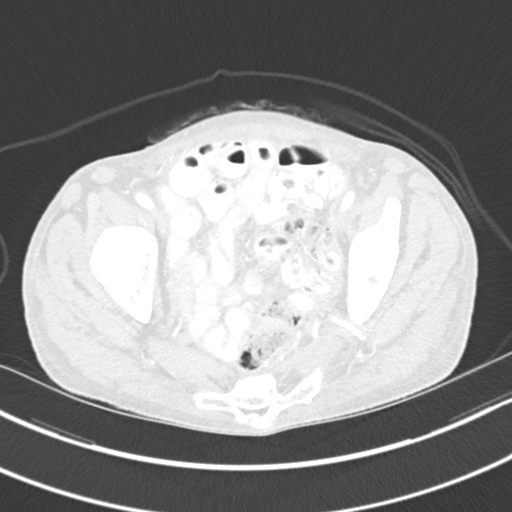
[im 41/136  lung]
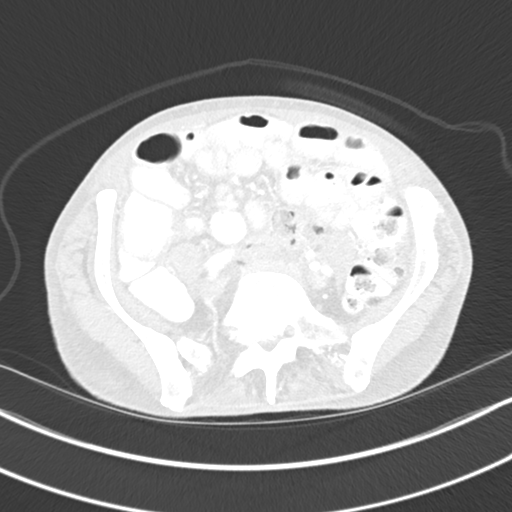
[im 55/136  lung]
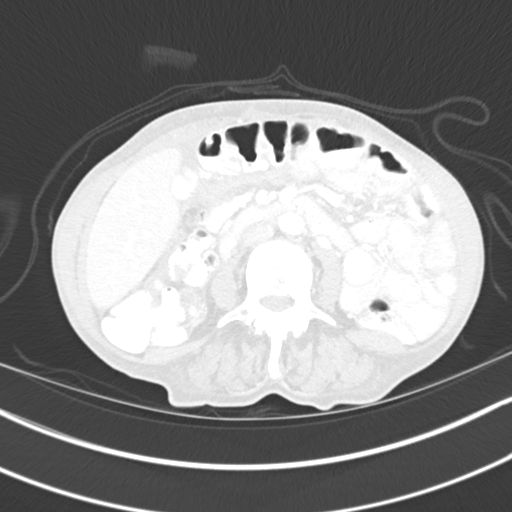
[im 82/136  mediastinal]
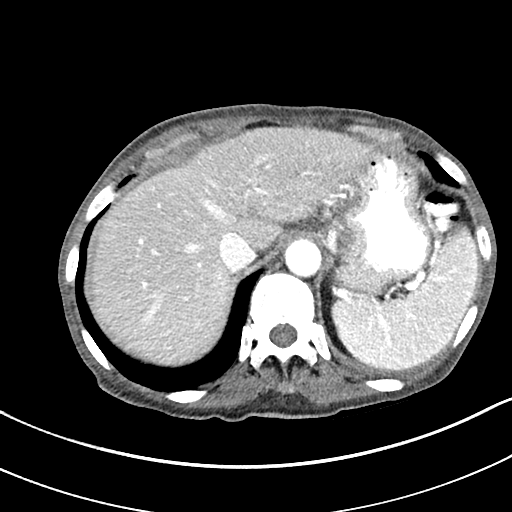
[im 82/136  lung]
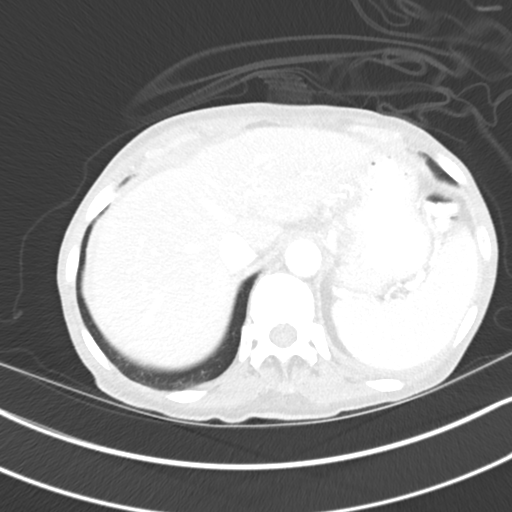
[im 95/136  lung]
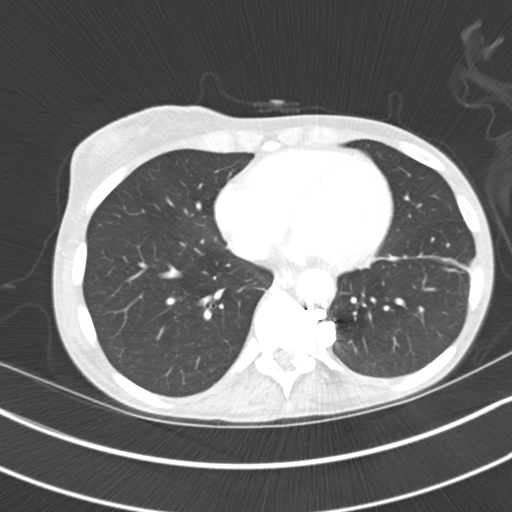
[im 109/136  lung]
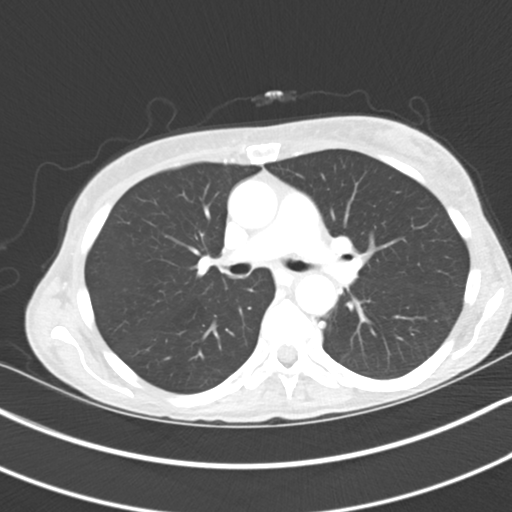
[im 122/136  lung]
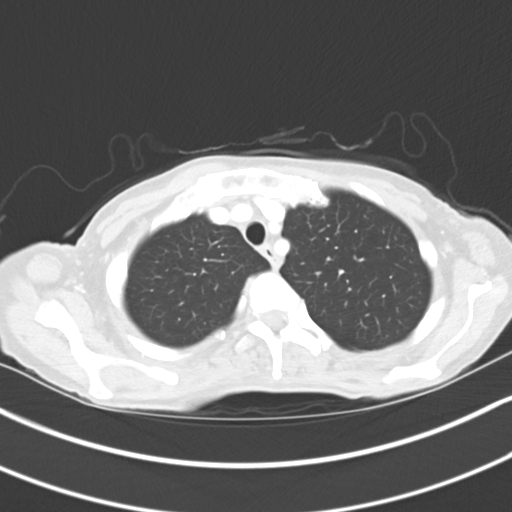

[Series 4: coronals cap · coronal · 0.66mm/px · 3 of 111 slices shown]
[im 23/111  lung]
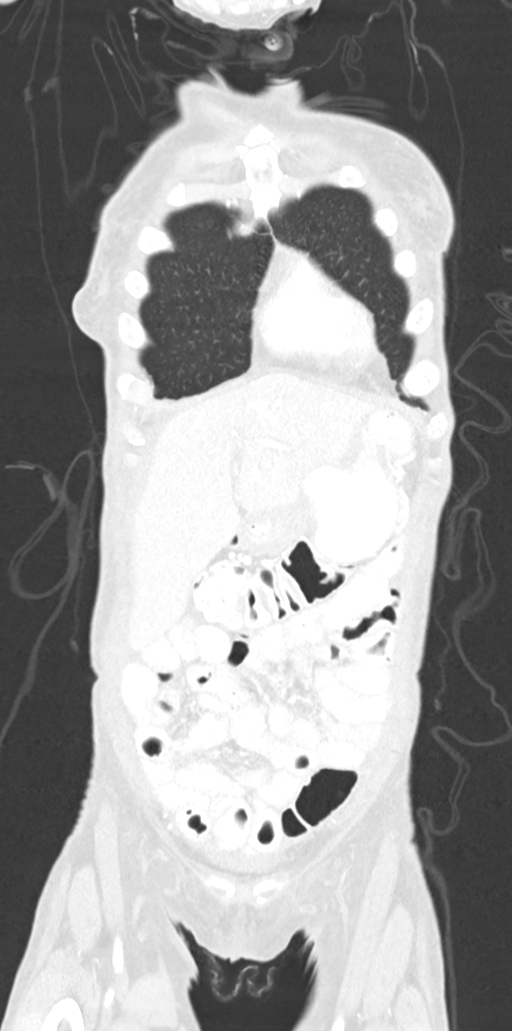
[im 45/111  lung]
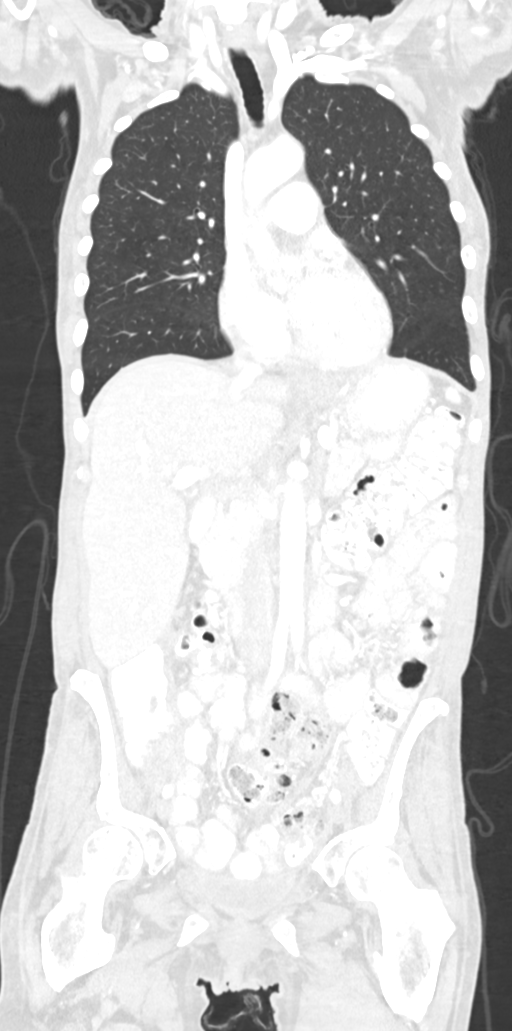
[im 67/111  lung]
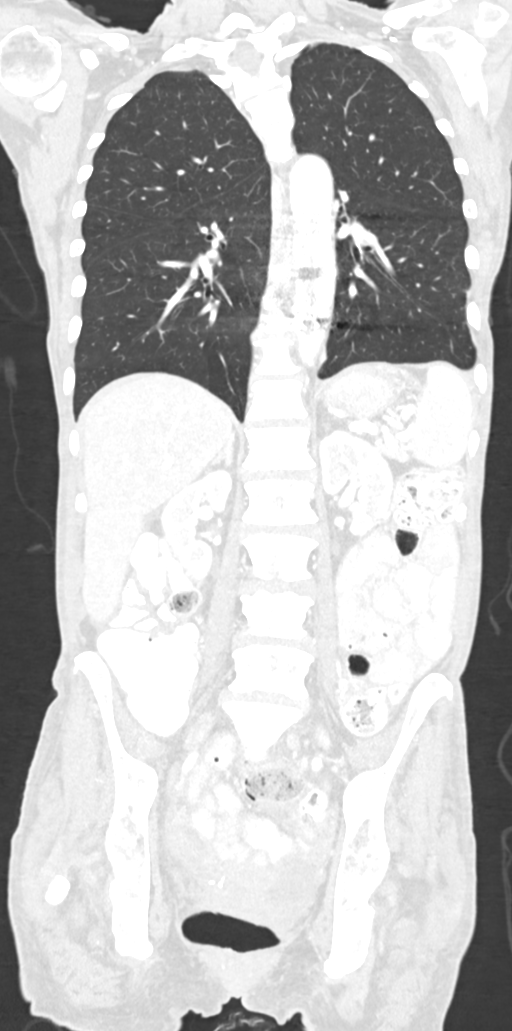

[11 of 36 positions shown; findings below may reference images not displayed]

FINDINGS: CT CHEST FINDINGS

Cardiovascular: Heart size is normal. There is no significant
pericardial fluid, thickening or pericardial calcification. There is
aortic atherosclerosis, as well as atherosclerosis of the great
vessels of the mediastinum and the coronary arteries, including
calcified atherosclerotic plaque in the left anterior descending
coronary artery.

Mediastinum/Nodes: No pathologically enlarged mediastinal or hilar
lymph nodes. Esophagus is unremarkable in appearance. No axillary
lymphadenopathy.

Lungs/Pleura: Mild linear scarring in the left lower lobe,
unchanged. No suspicious appearing pulmonary nodules or masses are
noted. No acute consolidative airspace disease. No pleural
effusions.

Musculoskeletal: Orthopedic fixation hardware in the midthoracic
spine. There are no aggressive appearing lytic or blastic lesions
noted in the visualized portions of the skeleton.

CT ABDOMEN PELVIS FINDINGS

Hepatobiliary: No suspicious cystic or solid hepatic lesions. No
intra or extrahepatic biliary ductal dilatation. Gallbladder is
normal in appearance.

Pancreas: No pancreatic mass. No pancreatic ductal dilatation. No
pancreatic or peripancreatic fluid collections or inflammatory
changes.

Spleen: Unremarkable.

Adrenals/Urinary Tract: Bilateral kidneys and adrenal glands are
normal in appearance. No hydroureteronephrosis. Urinary bladder is
nearly decompressed, but otherwise normal in appearance.

Stomach/Bowel: Normal appearance of the stomach. No pathologic
dilatation of small bowel or colon. The appendix is not confidently
identified and may be surgically absent. Regardless, there are no
inflammatory changes noted adjacent to the cecum to suggest the
presence of an acute appendicitis at this time.

Vascular/Lymphatic: Atherosclerosis throughout the abdominal and
pelvic vasculature, without evidence of aneurysm or dissection.
Previously noted retroperitoneal lymphadenopathy has regressed. No
new lymphadenopathy noted elsewhere in the abdomen or pelvis.

Reproductive: Previously noted complex cystic and solid ovarian
lesions have regressed compared to the prior examination. Right
ovary currently measures approximately 4.6 x 3.2 x 3.6 cm (axial
image 113 of series 2 and coronal image 71 of series 4), while the
left ovary currently measures 4.9 x 3.7 x 5.6 cm (axial image 118 of
series 2 and coronal image 76 of series 4). The right ovary is mixed
cystic and solid, but predominantly solid in appearance, while the
left ovary is now predominantly cystic in appearance. Uterus is
either atrophic or surgically absent.

Other: No significant volume of ascites.  No pneumoperitoneum.

Musculoskeletal: There are no aggressive appearing lytic or blastic
lesions noted in the visualized portions of the skeleton.
IMPRESSION: 1. Today's study demonstrates a marked positive response to therapy
with regression of bilateral ovarian lesions and resolution of
previously noted retroperitoneal lymphadenopathy. Additionally,
previously noted ascites has resolved.
2. No findings to suggest metastatic disease in the thorax.
3. Aortic atherosclerosis, in addition to left anterior descending
coronary artery disease. Please note that although the presence of
coronary artery calcium documents the presence of coronary artery
disease, the severity of this disease and any potential stenosis
cannot be assessed on this non-gated CT examination. Assessment for
potential risk factor modification, dietary therapy or pharmacologic
therapy may be warranted, if clinically indicated.
